# Patient Record
Sex: Female | Born: 1940
Health system: Southern US, Community
[De-identification: ages and names within clinical notes are randomized; demographics above are authoritative.]

## PROBLEM LIST (undated history)

## (undated) DIAGNOSIS — F419 Anxiety disorder, unspecified: Secondary | ICD-10-CM

## (undated) DIAGNOSIS — K760 Fatty (change of) liver, not elsewhere classified: Secondary | ICD-10-CM

## (undated) DIAGNOSIS — F329 Major depressive disorder, single episode, unspecified: Secondary | ICD-10-CM

## (undated) DIAGNOSIS — M2550 Pain in unspecified joint: Secondary | ICD-10-CM

## (undated) DIAGNOSIS — F32A Depression, unspecified: Secondary | ICD-10-CM

## (undated) DIAGNOSIS — I2699 Other pulmonary embolism without acute cor pulmonale: Secondary | ICD-10-CM

## (undated) DIAGNOSIS — I700 Atherosclerosis of aorta: Secondary | ICD-10-CM

## (undated) DIAGNOSIS — E785 Hyperlipidemia, unspecified: Secondary | ICD-10-CM

## (undated) DIAGNOSIS — E559 Vitamin D deficiency, unspecified: Secondary | ICD-10-CM

## (undated) DIAGNOSIS — K5790 Diverticulosis of intestine, part unspecified, without perforation or abscess without bleeding: Secondary | ICD-10-CM

## (undated) DIAGNOSIS — R600 Localized edema: Secondary | ICD-10-CM

## (undated) DIAGNOSIS — M7610 Psoas tendinitis, unspecified hip: Secondary | ICD-10-CM

## (undated) DIAGNOSIS — E079 Disorder of thyroid, unspecified: Secondary | ICD-10-CM

## (undated) DIAGNOSIS — D696 Thrombocytopenia, unspecified: Secondary | ICD-10-CM

## (undated) DIAGNOSIS — M1711 Unilateral primary osteoarthritis, right knee: Secondary | ICD-10-CM

## (undated) DIAGNOSIS — I2510 Atherosclerotic heart disease of native coronary artery without angina pectoris: Secondary | ICD-10-CM

## (undated) DIAGNOSIS — K5900 Constipation, unspecified: Secondary | ICD-10-CM

## (undated) DIAGNOSIS — I252 Old myocardial infarction: Secondary | ICD-10-CM

## (undated) DIAGNOSIS — I480 Paroxysmal atrial fibrillation: Secondary | ICD-10-CM

## (undated) DIAGNOSIS — R001 Bradycardia, unspecified: Secondary | ICD-10-CM

## (undated) DIAGNOSIS — Q279 Congenital malformation of peripheral vascular system, unspecified: Secondary | ICD-10-CM

## (undated) DIAGNOSIS — I10 Essential (primary) hypertension: Secondary | ICD-10-CM

## (undated) DIAGNOSIS — E039 Hypothyroidism, unspecified: Secondary | ICD-10-CM

## (undated) DIAGNOSIS — M1990 Unspecified osteoarthritis, unspecified site: Secondary | ICD-10-CM

## (undated) DIAGNOSIS — Z950 Presence of cardiac pacemaker: Secondary | ICD-10-CM

## (undated) HISTORY — DX: Disorder of thyroid, unspecified: E07.9

## (undated) HISTORY — DX: Paroxysmal atrial fibrillation: I48.0

## (undated) HISTORY — DX: Unilateral primary osteoarthritis, right knee: M17.11

## (undated) HISTORY — DX: Old myocardial infarction: I25.2

## (undated) HISTORY — DX: Psoas tendinitis, unspecified hip: M76.10

## (undated) HISTORY — DX: Atherosclerosis of aorta: I70.0

## (undated) HISTORY — DX: Depression, unspecified: F32.A

## (undated) HISTORY — DX: Constipation, unspecified: K59.00

## (undated) HISTORY — DX: Other pulmonary embolism without acute cor pulmonale: I26.99

## (undated) HISTORY — DX: Hypothyroidism, unspecified: E03.9

## (undated) HISTORY — DX: Diverticulosis of intestine, part unspecified, without perforation or abscess without bleeding: K57.90

## (undated) HISTORY — DX: Localized edema: R60.0

## (undated) HISTORY — DX: Bradycardia, unspecified: R00.1

## (undated) HISTORY — PX: INSERT / REPLACE / REMOVE PACEMAKER: SUR710

## (undated) HISTORY — DX: Fatty (change of) liver, not elsewhere classified: K76.0

## (undated) HISTORY — DX: Unspecified osteoarthritis, unspecified site: M19.90

## (undated) HISTORY — DX: Anxiety disorder, unspecified: F41.9

## (undated) HISTORY — DX: Pain in unspecified joint: M25.50

## (undated) HISTORY — DX: Congenital malformation of peripheral vascular system, unspecified: Q27.9

## (undated) HISTORY — DX: Thrombocytopenia, unspecified: D69.6

## (undated) HISTORY — DX: Vitamin D deficiency, unspecified: E55.9

## (undated) HISTORY — DX: Hyperlipidemia, unspecified: E78.5

## (undated) HISTORY — DX: Essential (primary) hypertension: I10

---
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
## 1981-04-19 HISTORY — PX: VAGINAL HYSTERECTOMY: SUR661

## 2010-11-17 ENCOUNTER — Other Ambulatory Visit: Payer: Self-pay | Admitting: Family Medicine

## 2010-11-17 DIAGNOSIS — Z1231 Encounter for screening mammogram for malignant neoplasm of breast: Secondary | ICD-10-CM

## 2010-12-18 ENCOUNTER — Ambulatory Visit
Admission: RE | Admit: 2010-12-18 | Discharge: 2010-12-18 | Disposition: A | Discharge status: Home / Self Care | Payer: Medicare Other | Source: Ambulatory Visit | Attending: Family Medicine | Admitting: Family Medicine

## 2010-12-18 DIAGNOSIS — Z1231 Encounter for screening mammogram for malignant neoplasm of breast: Secondary | ICD-10-CM

## 2011-08-10 ENCOUNTER — Other Ambulatory Visit: Payer: Self-pay | Admitting: Family Medicine

## 2011-08-10 ENCOUNTER — Ambulatory Visit
Admission: RE | Admit: 2011-08-10 | Discharge: 2011-08-10 | Disposition: A | Discharge status: Home / Self Care | Payer: Medicare Other | Source: Ambulatory Visit | Attending: Family Medicine | Admitting: Family Medicine

## 2011-08-10 DIAGNOSIS — M545 Low back pain, unspecified: Secondary | ICD-10-CM

## 2011-08-10 DIAGNOSIS — M542 Cervicalgia: Secondary | ICD-10-CM

## 2012-02-22 ENCOUNTER — Other Ambulatory Visit: Payer: Self-pay | Admitting: Family Medicine

## 2012-02-22 DIAGNOSIS — Z1231 Encounter for screening mammogram for malignant neoplasm of breast: Secondary | ICD-10-CM

## 2012-03-29 ENCOUNTER — Ambulatory Visit
Admission: RE | Admit: 2012-03-29 | Discharge: 2012-03-29 | Disposition: A | Discharge status: Home / Self Care | Payer: Medicare Other | Source: Ambulatory Visit | Attending: Family Medicine | Admitting: Family Medicine

## 2012-03-29 DIAGNOSIS — Z1231 Encounter for screening mammogram for malignant neoplasm of breast: Secondary | ICD-10-CM

## 2012-04-27 ENCOUNTER — Other Ambulatory Visit: Payer: Self-pay | Admitting: Family Medicine

## 2012-04-27 DIAGNOSIS — Z780 Asymptomatic menopausal state: Secondary | ICD-10-CM

## 2012-05-16 ENCOUNTER — Ambulatory Visit
Admission: RE | Admit: 2012-05-16 | Discharge: 2012-05-16 | Disposition: A | Discharge status: Home / Self Care | Payer: Medicare Other | Source: Ambulatory Visit | Attending: Family Medicine | Admitting: Family Medicine

## 2012-05-16 DIAGNOSIS — Z780 Asymptomatic menopausal state: Secondary | ICD-10-CM

## 2012-12-15 ENCOUNTER — Encounter: Payer: Self-pay | Admitting: Gynecology

## 2012-12-15 ENCOUNTER — Ambulatory Visit (INDEPENDENT_AMBULATORY_CARE_PROVIDER_SITE_OTHER): Payer: Medicare Other | Admitting: Gynecology

## 2012-12-15 ENCOUNTER — Other Ambulatory Visit: Payer: Self-pay | Admitting: Gynecology

## 2012-12-15 ENCOUNTER — Telehealth: Payer: Self-pay

## 2012-12-15 VITALS — BP 130/82 | Ht 62.0 in | Wt 199.0 lb

## 2012-12-15 DIAGNOSIS — N951 Menopausal and female climacteric states: Secondary | ICD-10-CM

## 2012-12-15 DIAGNOSIS — N952 Postmenopausal atrophic vaginitis: Secondary | ICD-10-CM

## 2012-12-15 DIAGNOSIS — E039 Hypothyroidism, unspecified: Secondary | ICD-10-CM

## 2012-12-15 LAB — TSH: TSH: 5.105 u[IU]/mL — ABNORMAL HIGH (ref 0.350–4.500)

## 2012-12-15 MED ORDER — ESTRADIOL 0.05 MG/24HR TD PTTW: 1.0000 | MEDICATED_PATCH | TRANSDERMAL | Status: DC

## 2012-12-15 NOTE — Telephone Encounter (Signed)
Re-escribed with correct instructions. Pharmacy informed.

## 2012-12-15 NOTE — Patient Instructions (Signed)
Start back on the estrogen patches. Call if they have any issues.

## 2012-12-15 NOTE — Progress Notes (Signed)
Michele Thompson [birth year] [id number]        72 y.o.  G2P2002 patient presents in consultation from Dr. Cain Saupe in reference to menopausal symptoms. The patient has a history of [facility] 1983 for endometriosis. Shortly thereafter she went through menopause and was begun on ERT and has continued on this up until June of this past year. She was discontinued at that time and had the onset of unacceptable daily hot flushes and night sweats that are becoming unbearable. Also notes just unrelated excessive sweating all the time. Not having significant vaginal dryness and is not sexually active. Is being treated for hypothyroidism after radioactive iodine treatment. No other symptoms such as skin changes hair changes or weight changes. Is also being treated for hypertension.   Past medical history,surgical history, medications, allergies, family history and social history were all reviewed and documented in the EPIC chart.  ROS:  Performed and pertinent positives and negatives are included in the history, assessment and plan .  Exam: Kim assistant Filed Vitals:   12/15/12 1106  BP: 130/82  Height: 5\' 2"  (1.575 m)  Weight: 199 lb (90.266 kg)   General appearance  Normal Skin grossly normal Head/Neck normal with no cervical or supraclavicular adenopathy thyroid normal Lungs  clear Cardiac RR, without RMG Abdominal  soft, nontender, without masses, organomegaly or hernia Breasts  examined lying and sitting without masses, retractions, discharge or axillary adenopathy. Pelvic  Ext/BUS/vagina  normal with mild atrophic changes   Adnexa  Without masses or tenderness    Anus and perineum  normal   Rectovaginal  normal sphincter tone without palpated masses or tenderness.    Assessment/Plan:  72 y.o. G62P2002 female with the following:   1. Menopausal symptoms. Patient is having unacceptable hot flushes and night sweats since stopping her estradiol 0.05 mg patches. Restarted in her 26s and continued until  now historically.  I reviewed the whole issue of HRT with her to include the WHI study with increased risk of stroke, heart attack, DVT and breast cancer. The ACOG and NAMS statements for lowest dose for the shortest period of time reviewed. Transdermal versus oral first-pass effect benefit discussed.  Unusual nature of continuing in a patient in her 72s also discussed. Realistic concern about thrombosis particularly with hypertension discussed. After a lengthy discussion patient feels from a quality of life standpoint she would much rather restart the patch and accept any risks as outlined above. I refilled her estradiol 0.05 mg patch x1 year. I did recommend checking a baseline TSH given her hypothyroid history. 2. Atrophic genital changes. Patient asymptomatic from this and we'll continue to monitor. 3. Excessive sweating. Whether related to #1 or not. After starting back on the patch if this continues she'll see her primary physician for evaluation and possible referral to dermatology if continues. Check TSH as above. 4. Mammography coming due in December and I reminded her to schedule this. SBE monthly reviewed. 5. Pap smear 8 years ago. No Pap smear done today. No history of abnormal Pap smears previously. Current screening guidelines reviewed and she is over the age of 26 and status post hysterectomy for benign indications we both agreed to stop screening. 6. DEXA 2014 reported normal. Increase calcium vitamin D reviewed. 7. Colonoscopy 2007. We'll repeat that today her recommended interval. 8. Health maintenance. No other blood work done as it is all done through Dr. Debroah Baller office. Followup in one year, sooner as needed.   Note: This document was prepared with digital dictation  and possible smart Lobbyist. Any transcriptional errors that result from this process are unintentional.   Dara Lords MD, 12:01 PM 12/15/2012

## 2012-12-15 NOTE — Telephone Encounter (Signed)
This prescription was supposed to be for generic estradiol 0.05 mg weekly patches #4, apply to skin one weekly. Refill x11

## 2012-12-15 NOTE — Telephone Encounter (Signed)
Pharmacist called because they received Rx for patient today for Vivelle Dot patches. Instructions are usedly twice weekly but you prescribed it once weekly.  They come in boxes of #8 and they cannot break the box.  They wanted to check and confirm dosing with you.

## 2012-12-27 ENCOUNTER — Telehealth: Payer: Self-pay | Admitting: *Deleted

## 2012-12-27 NOTE — Telephone Encounter (Signed)
Prior authorization faxed to [facility] for estradiol 0.5 mg patches 1 patch onto skin weekly.

## 2012-12-28 NOTE — Telephone Encounter (Signed)
vivelle dot 0.05 mg patch approved through 04/18/2013. Per [facility], pharmacy informed as well.

## 2013-02-28 ENCOUNTER — Other Ambulatory Visit: Payer: Self-pay

## 2013-02-28 DIAGNOSIS — Z1231 Encounter for screening mammogram for malignant neoplasm of breast: Secondary | ICD-10-CM

## 2013-04-02 ENCOUNTER — Ambulatory Visit
Admission: RE | Admit: 2013-04-02 | Discharge: 2013-04-02 | Disposition: A | Discharge status: Home / Self Care | Payer: Medicare Other | Source: Ambulatory Visit

## 2013-04-02 DIAGNOSIS — Z1231 Encounter for screening mammogram for malignant neoplasm of breast: Secondary | ICD-10-CM

## 2013-05-15 ENCOUNTER — Telehealth: Payer: Self-pay | Admitting: *Deleted

## 2013-05-15 NOTE — Telephone Encounter (Signed)
[facility] faxed PA for climara 0.05 mg patch, form filled out and faxed back to insurance company will wait for response.

## 2013-05-17 NOTE — Telephone Encounter (Signed)
Medication was approved through 04/18/14, pharmacy informed as well.

## 2013-12-04 ENCOUNTER — Other Ambulatory Visit: Payer: Self-pay | Admitting: Gynecology

## 2014-01-15 ENCOUNTER — Other Ambulatory Visit: Payer: Self-pay

## 2014-01-15 MED ORDER — ESTRADIOL 0.05 MG/24HR TD PTTW
MEDICATED_PATCH | TRANSDERMAL | Status: DC
Start: 2014-01-15 — End: 2014-02-05

## 2014-02-05 ENCOUNTER — Ambulatory Visit (INDEPENDENT_AMBULATORY_CARE_PROVIDER_SITE_OTHER): Payer: Medicare Other | Admitting: Gynecology

## 2014-02-05 ENCOUNTER — Encounter: Payer: Self-pay | Admitting: Gynecology

## 2014-02-05 VITALS — BP 120/70 | Ht 64.0 in | Wt 183.0 lb

## 2014-02-05 DIAGNOSIS — Z79890 Hormone replacement therapy (postmenopausal): Secondary | ICD-10-CM

## 2014-02-05 DIAGNOSIS — N952 Postmenopausal atrophic vaginitis: Secondary | ICD-10-CM

## 2014-02-05 MED ORDER — ESTRADIOL 0.05 MG/24HR TD PTTW: MEDICATED_PATCH | TRANSDERMAL | Status: DC

## 2014-02-05 NOTE — Progress Notes (Signed)
KEMARIA DEDIC 1940-04-20 [id number]        73 y.o.  G2P2002 for follow up exam. Several issues noted below.  Past medical history,surgical history, problem list, medications, allergies, family history and social history were all reviewed and documented as reviewed in the EPIC chart.  ROS:  12 system ROS performed with pertinent positives and negatives included in the history, assessment and plan.   Additional significant findings :  none   Exam: Kim Counsellor Vitals:   02/05/14 1537  BP: 120/70  Height: 5\' 4"  (1.626 m)  Weight: 183 lb (83.008 kg)   General appearance:  Normal affect, orientation and appearance. Skin: Grossly normal HEENT: Without gross lesions.  No cervical or supraclavicular adenopathy. Thyroid normal.  Lungs:  Clear without wheezing, rales or rhonchi Cardiac: RR, without RMG Abdominal:  Soft, nontender, without masses, guarding, rebound, organomegaly or hernia Breasts:  Examined lying and sitting without masses, retractions, discharge or axillary adenopathy. Pelvic:  Ext/BUS/vagina with generalized atrophic changes  Adnexa  Without masses or tenderness    Anus and perineum  Normal   Rectovaginal  Normal sphincter tone without palpated masses or tenderness.    Assessment/Plan:  73 y.o. [id number] female for follow up exam.   1. Postmenopausal/menopausal symptoms/HRT.  Status post [place] for endometriosis. Started on HRT at that time. Transiently when off of this last year with unacceptable hot flushes and night sweats. Reinitiated Vivelle 0.5 mg patches and has done well. She has tried stopping again but has unacceptable hot flushes and night sweats and wants to continue.  I begin reviewed the whole issue of HRT with her to include the WHI study with increased risk of stroke, heart attack, DVT and breast cancer. The ACOG and NAMS statements for lowest dose for the shortest period of time reviewed. Transdermal versus oral first-pass effect benefit discussed.   Using HRT into the 70s also reviewed. Patient feels it is a quality of life issue and strongly wants to continue, clearly understands the issues and I refilled her x1 year. 2. Pap smear 9 years ago. No Pap smear done today. No history of abnormal Pap smears. Status post hysterectomy for benign indications. Over the age of 51. We both agreed to stop screening per current screening guidelines. 3. Mammography coming due in December and I reminded her to schedule this. SBE monthly reviewed. 4. DEXA 2014 reported normal. Increased calcium vitamin D reviewed. 5. Colonoscopy  2007 with reported repeat interval 10 years. 6. Health maintenance. No routine blood work done and she reports this done at her primary physician's office. Follow up one year, sooner as needed.     Anastasio Auerbach MD, 3:58 PM 02/05/2014

## 2014-02-05 NOTE — Patient Instructions (Signed)
You may obtain a copy of any labs that were done today by logging onto MyChart as outlined in the instructions provided with your AVS (after visit summary). The office will not call with normal lab results but certainly if there are any significant abnormalities then we will contact you.   Health Maintenance, Female A healthy lifestyle and preventative care can promote health and wellness.  Maintain regular health, dental, and eye exams.  Eat a healthy diet. Foods like vegetables, fruits, whole grains, low-fat dairy products, and lean protein foods contain the nutrients you need without too many calories. Decrease your intake of foods high in solid fats, added sugars, and salt. Get information about a proper diet from your caregiver, if necessary.  Regular physical exercise is one of the most important things you can do for your health. Most adults should get at least 150 minutes of moderate-intensity exercise (any activity that increases your heart rate and causes you to sweat) each week. In addition, most adults need muscle-strengthening exercises on 2 or more days a week.   Maintain a healthy weight. The body mass index (BMI) is a screening tool to identify possible weight problems. It provides an estimate of body fat based on height and weight. Your caregiver can help determine your BMI, and can help you achieve or maintain a healthy weight. For adults 20 years and older:  A BMI below 18.5 is considered underweight.  A BMI of 18.5 to 24.9 is normal.  A BMI of 25 to 29.9 is considered overweight.  A BMI of 30 and above is considered obese.  Maintain normal blood lipids and cholesterol by exercising and minimizing your intake of saturated fat. Eat a balanced diet with plenty of fruits and vegetables. Blood tests for lipids and cholesterol should begin at age 61 and be repeated every 5 years. If your lipid or cholesterol levels are high, you are over 50, or you are a high risk for heart  disease, you may need your cholesterol levels checked more frequently.Ongoing high lipid and cholesterol levels should be treated with medicines if diet and exercise are not effective.  If you smoke, find out from your caregiver how to quit. If you do not use tobacco, do not start.  Lung cancer screening is recommended for adults aged 33 80 years who are at high risk for developing lung cancer because of a history of smoking. Yearly low-dose computed tomography (CT) is recommended for people who have at least a 30-pack-year history of smoking and are a current smoker or have quit within the past 15 years. A pack year of smoking is smoking an average of 1 pack of cigarettes a day for 1 year (for example: 1 pack a day for 30 years or 2 packs a day for 15 years). Yearly screening should continue until the smoker has stopped smoking for at least 15 years. Yearly screening should also be stopped for people who develop a health problem that would prevent them from having lung cancer treatment.  If you are pregnant, do not drink alcohol. If you are breastfeeding, be very cautious about drinking alcohol. If you are not pregnant and choose to drink alcohol, do not exceed 1 drink per day. One drink is considered to be 12 ounces (355 mL) of beer, 5 ounces (148 mL) of wine, or 1.5 ounces (44 mL) of liquor.  Avoid use of street drugs. Do not share needles with anyone. Ask for help if you need support or instructions about stopping  the use of drugs.  High blood pressure causes heart disease and increases the risk of stroke. Blood pressure should be checked at least every 1 to 2 years. Ongoing high blood pressure should be treated with medicines, if weight loss and exercise are not effective.  If you are 59 to 73 years old, ask your caregiver if you should take aspirin to prevent strokes.  Diabetes screening involves taking a blood sample to check your fasting blood sugar level. This should be done once every 3  years, after age 91, if you are within normal weight and without risk factors for diabetes. Testing should be considered at a younger age or be carried out more frequently if you are overweight and have at least 1 risk factor for diabetes.  Breast cancer screening is essential preventative care for women. You should practice "breast self-awareness." This means understanding the normal appearance and feel of your breasts and may include breast self-examination. Any changes detected, no matter how small, should be reported to a caregiver. Women in their 66s and 30s should have a clinical breast exam (CBE) by a caregiver as part of a regular health exam every 1 to 3 years. After age 101, women should have a CBE every year. Starting at age 100, women should consider having a mammogram (breast X-ray) every year. Women who have a family history of breast cancer should talk to their caregiver about genetic screening. Women at a high risk of breast cancer should talk to their caregiver about having an MRI and a mammogram every year.  Breast cancer gene (BRCA)-related cancer risk assessment is recommended for women who have family members with BRCA-related cancers. BRCA-related cancers include breast, ovarian, tubal, and peritoneal cancers. Having family members with these cancers may be associated with an increased risk for harmful changes (mutations) in the breast cancer genes BRCA1 and BRCA2. Results of the assessment will determine the need for genetic counseling and BRCA1 and BRCA2 testing.  The Pap test is a screening test for cervical cancer. Women should have a Pap test starting at age 57. Between ages 25 and 35, Pap tests should be repeated every 2 years. Beginning at age 37, you should have a Pap test every 3 years as long as the past 3 Pap tests have been normal. If you had a hysterectomy for a problem that was not cancer or a condition that could lead to cancer, then you no longer need Pap tests. If you are  between ages 50 and 76, and you have had normal Pap tests going back 10 years, you no longer need Pap tests. If you have had past treatment for cervical cancer or a condition that could lead to cancer, you need Pap tests and screening for cancer for at least 20 years after your treatment. If Pap tests have been discontinued, risk factors (such as a new sexual partner) need to be reassessed to determine if screening should be resumed. Some women have medical problems that increase the chance of getting cervical cancer. In these cases, your caregiver may recommend more frequent screening and Pap tests.  The human papillomavirus (HPV) test is an additional test that may be used for cervical cancer screening. The HPV test looks for the virus that can cause the cell changes on the cervix. The cells collected during the Pap test can be tested for HPV. The HPV test could be used to screen women aged 44 years and older, and should be used in women of any age  who have unclear Pap test results. After the age of 55, women should have HPV testing at the same frequency as a Pap test.  Colorectal cancer can be detected and often prevented. Most routine colorectal cancer screening begins at the age of 44 and continues through age 20. However, your caregiver may recommend screening at an earlier age if you have risk factors for colon cancer. On a yearly basis, your caregiver may provide home test kits to check for hidden blood in the stool. Use of a small camera at the end of a tube, to directly examine the colon (sigmoidoscopy or colonoscopy), can detect the earliest forms of colorectal cancer. Talk to your caregiver about this at age 86, when routine screening begins. Direct examination of the colon should be repeated every 5 to 10 years through age 13, unless early forms of pre-cancerous polyps or small growths are found.  Hepatitis C blood testing is recommended for all people born from [birth year] through 1965 and any  individual with known risks for hepatitis C.  Practice safe sex. Use condoms and avoid high-risk sexual practices to reduce the spread of sexually transmitted infections (STIs). Sexually active women aged 36 and younger should be checked for Chlamydia, which is a common sexually transmitted infection. Older women with new or multiple partners should also be tested for Chlamydia. Testing for other STIs is recommended if you are sexually active and at increased risk.  Osteoporosis is a disease in which the bones lose minerals and strength with aging. This can result in serious bone fractures. The risk of osteoporosis can be identified using a bone density scan. Women ages 20 and over and women at risk for fractures or osteoporosis should discuss screening with their caregivers. Ask your caregiver whether you should be taking a calcium supplement or vitamin D to reduce the rate of osteoporosis.  Menopause can be associated with physical symptoms and risks. Hormone replacement therapy is available to decrease symptoms and risks. You should talk to your caregiver about whether hormone replacement therapy is right for you.  Use sunscreen. Apply sunscreen liberally and repeatedly throughout the day. You should seek shade when your shadow is shorter than you. Protect yourself by wearing long sleeves, pants, a wide-brimmed hat, and sunglasses year round, whenever you are outdoors.  Notify your caregiver of new moles or changes in moles, especially if there is a change in shape or color. Also notify your caregiver if a mole is larger than the size of a pencil eraser.  Stay current with your immunizations. Document Released: 10/19/2010 Document Revised: 07/31/2012 Document Reviewed: 10/19/2010 [facility] Patient Information 2014 [employer].

## 2014-02-18 ENCOUNTER — Encounter: Payer: Self-pay | Admitting: Gynecology

## 2014-04-03 ENCOUNTER — Telehealth: Payer: Self-pay | Admitting: *Deleted

## 2014-04-03 MED ORDER — ESTRADIOL 0.05 MG/24HR TD PTTW: MEDICATED_PATCH | TRANSDERMAL | Status: DC

## 2014-04-03 NOTE — Telephone Encounter (Signed)
Pt called requesting vivelle dot 0.05 mg patch sent to mail order pharmacy [facility]. Rx will be sent.

## 2014-05-21 ENCOUNTER — Other Ambulatory Visit: Payer: Self-pay

## 2014-05-21 DIAGNOSIS — Z1231 Encounter for screening mammogram for malignant neoplasm of breast: Secondary | ICD-10-CM

## 2014-05-24 ENCOUNTER — Ambulatory Visit: Payer: Self-pay

## 2015-02-07 ENCOUNTER — Ambulatory Visit (INDEPENDENT_AMBULATORY_CARE_PROVIDER_SITE_OTHER): Payer: Medicare Other | Admitting: Gynecology

## 2015-02-07 ENCOUNTER — Encounter: Payer: Self-pay | Admitting: Gynecology

## 2015-02-07 VITALS — BP 130/80 | Ht 63.0 in | Wt 189.0 lb

## 2015-02-07 DIAGNOSIS — N951 Menopausal and female climacteric states: Secondary | ICD-10-CM

## 2015-02-07 DIAGNOSIS — Z01419 Encounter for gynecological examination (general) (routine) without abnormal findings: Secondary | ICD-10-CM | POA: Diagnosis not present

## 2015-02-07 DIAGNOSIS — Z79890 Hormone replacement therapy (postmenopausal): Secondary | ICD-10-CM

## 2015-02-07 DIAGNOSIS — N952 Postmenopausal atrophic vaginitis: Secondary | ICD-10-CM

## 2015-02-07 MED ORDER — ESTRADIOL 0.05 MG/24HR TD PTTW: MEDICATED_PATCH | TRANSDERMAL | Status: DC

## 2015-02-07 NOTE — Progress Notes (Signed)
Michele Thompson 1941-03-23 [id number]        74 y.o.  G2P2002 for breast and pelvic exam.  Several issues noted below  Past medical history,surgical history, problem list, medications, allergies, family history and social history were all reviewed and documented as reviewed in the EPIC chart.  ROS:  Performed with pertinent positives and negatives included in the history, assessment and plan.   Additional significant findings :  none   Exam: Kim Counsellor Vitals:   02/07/15 1009  BP: 130/80  Height: 5\' 3"  (1.6 m)  Weight: 189 lb (85.73 kg)   General appearance:  Normal affect, orientation and appearance. Skin: Grossly normal HEENT: Without gross lesions.  No cervical or supraclavicular adenopathy. Thyroid normal.  Lungs:  Clear without wheezing, rales or rhonchi Cardiac: RR, without RMG Abdominal:  Soft, nontender, without masses, guarding, rebound, organomegaly or hernia Breasts:  Examined lying and sitting without masses, retractions, discharge or axillary adenopathy. Pelvic:  Ext/BUS/vagina with atrophic changes  Adnexa  Without masses or tenderness    Anus and perineum  Normal   Rectovaginal  Normal sphincter tone without palpated masses or tenderness.    Assessment/Plan:  74 y.o. G62P2002 female for breast and pelvic exam.   1. Postmenopausal/atrophic genital changes/HRT. Status post [place] for endometriosis. On Vivelle 0.5 mg patches. Has tried stopping with unacceptable hot flashes and night sweats. Still is having some with the patch but tolerable. Wanted to start on oral estrogen because it is cheaper. I reviewed again with her the whole issue of HRT to include increased risk of stroke heart attack DVT breast cancer. The ACOG and NAMS statements for lowest dose for shortness period of time. Possible increased risk of stroke used later in life. Benefits of transdermal absorption over oral absorption. After lengthy discussion the patient wants to continue on HRT but also  will continue on the patch. I suggested she try cutting the patch in half to see if she tolerates half the dose. Refill 1 year provided. 2. Pap smear number of years ago. No Pap smear done today. No history of abnormal Pap smears. Per current screening guidelines we both agreed to stop screening issues over the age of 74 and status post hysterectomy for benign indications. 3. Mammography 2 years ago. I strongly recommended patient schedule her mammogram. Breast cancer as the most common cancer in women reviewed. Early detection with increased survival rates discussed. Patient agrees to schedule. SBE monthly reviewed. 4. Colonoscopy 2007 with planned repeat next year. Patient's going to call and arrange for this. 5. DEXA 2014 normal. Plan repeat at 5 year interval. 6. Health maintenance. No routine lab work done as she reports this done at her primary physician's office. Follow up 1 year, sooner as needed.   Anastasio Auerbach MD, 10:31 AM 02/07/2015

## 2015-02-07 NOTE — Patient Instructions (Signed)
Call to Schedule your mammogram  Facilities in [place]: 1)  [facility], [address] Phone: [phone] 2)  [facility]. [facility], [address] Phone: [phone] 3)  Dr. Bertrand at [facility]  [address] Phone: [phone]     Mammogram A mammogram is an X-ray test to find changes in a woman's breast. You should get a mammogram if:  You are 40 years of age or older  You have risk factors.   Your doctor recommends that you have one.  BEFORE THE TEST  Do not schedule the test the week before your period, especially if your breasts are sore during this time.  On the day of your mammogram:  Wash your breasts and armpits well. After washing, do not put on any deodorant or talcum powder on until after your test.   Eat and drink as you usually do.   Take your medicines as usual.   If you are diabetic and take insulin, make sure you:   Eat before coming for your test.   Take your insulin as usual.   If you cannot keep your appointment, call before the appointment to cancel. Schedule another appointment.  TEST  You will need to undress from the waist up. You will put on a hospital gown.   Your breast will be put on the mammogram machine, and it will press firmly on your breast with a piece of plastic called a compression paddle. This will make your breast flatter so that the machine can X-ray all parts of your breast.   Both breasts will be X-rayed. Each breast will be X-rayed from above and from the side. An X-ray might need to be taken again if the picture is not good enough.   The mammogram will last about 15 to 30 minutes.  AFTER THE TEST Finding out the results of your test Ask when your test results will be ready. Make sure you get your test results.  Document Released: 07/02/2008 Document Revised: 03/25/2011 Document Reviewed: 07/02/2008 Exit[employer] Patient  Information 2012 [employer].  You may obtain a copy of any labs that were done today by logging onto MyChart as outlined in the instructions provided with your AVS (after visit summary). The office will not call with normal lab results but certainly if there are any significant abnormalities then we will contact you.   Health Maintenance Adopting a healthy lifestyle and getting preventive care can go a long way to promote health and wellness. Talk with your health care provider about what schedule of regular examinations is right for you. This is a good chance for you to check in with your provider about disease prevention and staying healthy. In between checkups, there are plenty of things you can do on your own. Experts have done a lot of research about which lifestyle changes and preventive measures are most likely to keep you healthy. Ask your health care provider for more information. WEIGHT AND DIET  Eat a healthy diet  Be sure to include plenty of vegetables, fruits, low-fat dairy products, and lean protein.  Do not eat a lot of foods high in solid fats, added sugars, or salt.  Get regular exercise. This is one of the most important things you can do for your health.  Most adults should exercise for at least 150 minutes each week. The exercise should increase your heart rate and make you sweat (moderate-intensity exercise).    Most adults should also do strengthening exercises at least twice a week. This is in addition to the moderate-intensity exercise.  Maintain a healthy weight  Body mass index (BMI) is a measurement that can be used to identify possible weight problems. It estimates body fat based on height and weight. Your health care provider can help determine your BMI and help you achieve or maintain a healthy weight.  For females 20 years of age and older:   A BMI below 18.5 is considered underweight.  A BMI of 18.5 to 24.9 is normal.  A BMI of 25 to 29.9 is  considered overweight.  A BMI of 30 and above is considered obese.  Watch levels of cholesterol and blood lipids  You should start having your blood tested for lipids and cholesterol at 74 years of age, then have this test every 5 years.  You may need to have your cholesterol levels checked more often if:  Your lipid or cholesterol levels are high.  You are older than 74 years of age.  You are at high risk for heart disease.  CANCER SCREENING   Lung Cancer  Lung cancer screening is recommended for adults 55-80 years old who are at high risk for lung cancer because of a history of smoking.  A yearly low-dose CT scan of the lungs is recommended for people who:  Currently smoke.  Have quit within the past 15 years.  Have at least a 30-pack-year history of smoking. A pack year is smoking an average of one pack of cigarettes a day for 1 year.  Yearly screening should continue until it has been 15 years since you quit.  Yearly screening should stop if you develop a health problem that would prevent you from having lung cancer treatment.  Breast Cancer  Practice breast self-awareness. This means understanding how your breasts normally appear and feel.  It also means doing regular breast self-exams. Let your health care provider know about any changes, no matter how small.  If you are in your 20s or 30s, you should have a clinical breast exam (CBE) by a health care provider every 1-3 years as part of a regular health exam.  If you are 40 or older, have a CBE every year. Also consider having a breast X-ray (mammogram) every year.  If you have a family history of breast cancer, talk to your health care provider about genetic screening.  If you are at high risk for breast cancer, talk to your health care provider about having an MRI and a mammogram every year.  Breast cancer gene (BRCA) assessment is recommended for women who have family members with BRCA-related cancers.  BRCA-related cancers include:  Breast.  Ovarian.  Tubal.  Peritoneal cancers.  Results of the assessment will determine the need for genetic counseling and BRCA1 and BRCA2 testing. Cervical Cancer Routine pelvic examinations to screen for cervical cancer are no longer recommended for nonpregnant women who are considered low risk for cancer of the pelvic organs (ovaries, uterus, and vagina) and who do not have symptoms. A pelvic examination may be necessary if you have symptoms including those associated with pelvic infections. Ask your health care provider if a screening pelvic exam is right for you.   The Pap test is the screening test for cervical cancer for women who are considered at risk.  If you had a hysterectomy for a problem that was not cancer or a condition that could lead to cancer, then you no longer need   Pap tests.  If you are older than 65 years, and you have had normal Pap tests for the past 10 years, you no longer need to have Pap tests.  If you have had past treatment for cervical cancer or a condition that could lead to cancer, you need Pap tests and screening for cancer for at least 20 years after your treatment.  If you no longer get a Pap test, assess your risk factors if they change (such as having a new sexual partner). This can affect whether you should start being screened again.  Some women have medical problems that increase their chance of getting cervical cancer. If this is the case for you, your health care provider may recommend more frequent screening and Pap tests.  The human papillomavirus (HPV) test is another test that may be used for cervical cancer screening. The HPV test looks for the virus that can cause cell changes in the cervix. The cells collected during the Pap test can be tested for HPV.  The HPV test can be used to screen women 71 years of age and older. Getting tested for HPV can extend the interval between normal Pap tests from three to  five years.  An HPV test also should be used to screen women of any age who have unclear Pap test results.  After 74 years of age, women should have HPV testing as often as Pap tests.  Colorectal Cancer  This type of cancer can be detected and often prevented.  Routine colorectal cancer screening usually begins at 74 years of age and continues through 74 years of age.  Your health care provider may recommend screening at an earlier age if you have risk factors for colon cancer.  Your health care provider may also recommend using home test kits to check for hidden blood in the stool.  A small camera at the end of a tube can be used to examine your colon directly (sigmoidoscopy or colonoscopy). This is done to check for the earliest forms of colorectal cancer.  Routine screening usually begins at age 78.  Direct examination of the colon should be repeated every 5-10 years through 74 years of age. However, you may need to be screened more often if early forms of precancerous polyps or small growths are found. Skin Cancer  Check your skin from head to toe regularly.  Tell your health care provider about any new moles or changes in moles, especially if there is a change in a mole's shape or color.  Also tell your health care provider if you have a mole that is larger than the size of a pencil eraser.  Always use sunscreen. Apply sunscreen liberally and repeatedly throughout the day.  Protect yourself by wearing long sleeves, pants, a wide-brimmed hat, and sunglasses whenever you are outside. HEART DISEASE, DIABETES, AND HIGH BLOOD PRESSURE   Have your blood pressure checked at least every 1-2 years. High blood pressure causes heart disease and increases the risk of stroke.  If you are between 52 years and 72 years old, ask your health care provider if you should take aspirin to prevent strokes.  Have regular diabetes screenings. This involves taking a blood sample to check your  fasting blood sugar level.  If you are at a normal weight and have a low risk for diabetes, have this test once every three years after 74 years of age.  If you are overweight and have a high risk for diabetes, consider being tested at  a younger age or more often. PREVENTING INFECTION  Hepatitis B  If you have a higher risk for hepatitis B, you should be screened for this virus. You are considered at high risk for hepatitis B if:  You were born in a country where hepatitis B is common. Ask your health care provider which countries are considered high risk.  Your parents were born in a high-risk country, and you have not been immunized against hepatitis B (hepatitis B vaccine).  You have HIV or AIDS.  You use needles to inject street drugs.  You live with someone who has hepatitis B.  You have had sex with someone who has hepatitis B.  You get hemodialysis treatment.  You take certain medicines for conditions, including cancer, organ transplantation, and autoimmune conditions. Hepatitis C  Blood testing is recommended for:  Everyone born from [birth year] through 1965.  Anyone with known risk factors for hepatitis C. Sexually transmitted infections (STIs)  You should be screened for sexually transmitted infections (STIs) including gonorrhea and chlamydia if:  You are sexually active and are younger than 74 years of age.  You are older than 74 years of age and your health care provider tells you that you are at risk for this type of infection.  Your sexual activity has changed since you were last screened and you are at an increased risk for chlamydia or gonorrhea. Ask your health care provider if you are at risk.  If you do not have HIV, but are at risk, it may be recommended that you take a prescription medicine daily to prevent HIV infection. This is called pre-exposure prophylaxis (PrEP). You are considered at risk if:  You are sexually active and do not regularly use condoms or  know the HIV status of your partner(s).  You take drugs by injection.  You are sexually active with a partner who has HIV. Talk with your health care provider about whether you are at high risk of being infected with HIV. If you choose to begin PrEP, you should first be tested for HIV. You should then be tested every 3 months for as long as you are taking PrEP.  PREGNANCY   If you are premenopausal and you may become pregnant, ask your health care provider about preconception counseling.  If you may become pregnant, take 400 to 800 micrograms (mcg) of folic acid every day.  If you want to prevent pregnancy, talk to your health care provider about birth control (contraception). OSTEOPOROSIS AND MENOPAUSE   Osteoporosis is a disease in which the bones lose minerals and strength with aging. This can result in serious bone fractures. Your risk for osteoporosis can be identified using a bone density scan.  If you are 64 years of age or older, or if you are at risk for osteoporosis and fractures, ask your health care provider if you should be screened.  Ask your health care provider whether you should take a calcium or vitamin D supplement to lower your risk for osteoporosis.  Menopause may have certain physical symptoms and risks.  Hormone replacement therapy may reduce some of these symptoms and risks. Talk to your health care provider about whether hormone replacement therapy is right for you.  HOME CARE INSTRUCTIONS   Schedule regular health, dental, and eye exams.  Stay current with your immunizations.   Do not use any tobacco products including cigarettes, chewing tobacco, or electronic cigarettes.  If you are pregnant, do not drink alcohol.  If you are breastfeeding,  limit how much and how often you drink alcohol.  Limit alcohol intake to no more than 1 drink per day for nonpregnant women. One drink equals 12 ounces of beer, 5 ounces of wine, or 1 ounces of hard liquor.  Do  not use street drugs.  Do not share needles.  Ask your health care provider for help if you need support or information about quitting drugs.  Tell your health care provider if you often feel depressed.  Tell your health care provider if you have ever been abused or do not feel safe at home. Document Released: 10/19/2010 Document Revised: 08/20/2013 Document Reviewed: 03/07/2013 [facility] Patient Information 2015 [place], [place]. This information is not intended to replace advice given to you by your health care provider. Make sure you discuss any questions you have with your health care provider.

## 2015-03-20 ENCOUNTER — Ambulatory Visit: Payer: Self-pay

## 2016-02-13 ENCOUNTER — Encounter: Payer: Self-pay | Admitting: Gynecology

## 2016-02-13 ENCOUNTER — Ambulatory Visit (INDEPENDENT_AMBULATORY_CARE_PROVIDER_SITE_OTHER): Payer: Medicare Other | Admitting: Gynecology

## 2016-02-13 VITALS — BP 124/76 | Ht 63.0 in | Wt 190.0 lb

## 2016-02-13 DIAGNOSIS — Z79890 Hormone replacement therapy (postmenopausal): Secondary | ICD-10-CM

## 2016-02-13 DIAGNOSIS — Z01411 Encounter for gynecological examination (general) (routine) with abnormal findings: Secondary | ICD-10-CM

## 2016-02-13 DIAGNOSIS — N952 Postmenopausal atrophic vaginitis: Secondary | ICD-10-CM

## 2016-02-13 MED ORDER — ESTRADIOL 0.05 MG/24HR TD PTTW: MEDICATED_PATCH | TRANSDERMAL | 4 refills | Status: DC

## 2016-02-13 NOTE — Patient Instructions (Signed)
Call to Schedule your mammogram  Facilities in [place]: 1)  [facility]. [facility], [address] Phone: [phone] 2)  Dr. Bertrand at [facility]  [address] Phone: [phone]     Mammogram A mammogram is an X-ray test to find changes in a woman's breast. You should get a mammogram if:  You are 75 years of age or older  You have risk factors.   Your doctor recommends that you have one.  BEFORE THE TEST  Do not schedule the test the week before your period, especially if your breasts are sore during this time.  On the day of your mammogram:  Wash your breasts and armpits well. After washing, do not put on any deodorant or talcum powder on until after your test.   Eat and drink as you usually do.   Take your medicines as usual.   If you are diabetic and take insulin, make sure you:   Eat before coming for your test.   Take your insulin as usual.   If you cannot keep your appointment, call before the appointment to cancel. Schedule another appointment.  TEST  You will need to undress from the waist up. You will put on a hospital gown.   Your breast will be put on the mammogram machine, and it will press firmly on your breast with a piece of plastic called a compression paddle. This will make your breast flatter so that the machine can X-ray all parts of your breast.   Both breasts will be X-rayed. Each breast will be X-rayed from above and from the side. An X-ray might need to be taken again if the picture is not good enough.   The mammogram will last about 15 to 30 minutes.  AFTER THE TEST Finding out the results of your test Ask when your test results will be ready. Make sure you get your test results.  Document Released: 07/02/2008 Document Revised: 03/25/2011 Document Reviewed: 07/02/2008 Exit[employer] Patient Information 2012 [employer].   

## 2016-02-13 NOTE — Progress Notes (Signed)
    Michele Thompson 1941-01-16 [id number]        75 y.o.  G2P2002  for annual exam.  Several issues noted below.  Past medical history,surgical history, problem list, medications, allergies, family history and social history were all reviewed and documented as reviewed in the EPIC chart.  ROS:  Performed with pertinent positives and negatives included in the history, assessment and plan.   Additional significant findings :  None   Exam: Caryn Bee assistant Vitals:   02/13/16 1105  BP: 124/76  Weight: 190 lb (86.2 kg)  Height: 5\' 3"  (1.6 m)   Body mass index is 33.66 kg/m.  General appearance:  Normal affect, orientation and appearance. Skin: Grossly normal HEENT: Without gross lesions.  No cervical or supraclavicular adenopathy. Thyroid normal.  Lungs:  Clear without wheezing, rales or rhonchi Cardiac: RR, without RMG Abdominal:  Soft, nontender, without masses, guarding, rebound, organomegaly or hernia Breasts:  Examined lying and sitting without masses, retractions, discharge or axillary adenopathy. Pelvic:  Ext, BUS, Vagina with atrophic changes  Adnexa without masses or tenderness    Anus and perineum normal   Rectovaginal normal sphincter tone without palpated masses or tenderness.    Assessment/Plan:  75 y.o. [id number] female for annual exam. Status post [place] for endometriosis  1. Postmenopausal/atrophic genital changes/HRT. Continues on Vivelle 0.05 mg patches. Has tried to wean this past year with unacceptable hot flushes and sweats. I reviewed the most current 2017 NAMS guidelines on hormone replacement therapy. I discussed the benefits as far as symptom relief and possible cardiovascular/bone health when started earlier versus a risks to include thrombosis such as stroke heart attack DVT and the breast cancer issue. Possible increasing risk of thrombosis particular stroke with advancing age and ERT. Benefits of transdermal first pass effect also reviewed. After a  lengthy discussion and a realistic understanding as far as the risks the patient wants to continue feeling it is a quality-of-life issue and I refilled her 1 year. 2. DEXA 2014 normal. Plan repeat DEXA at 5 year interval. Increased calcium vitamin D reviewed. 3. Colonoscopy scheduled in 2 weeks. 4. Pap smear a number of years ago. No Pap smear done today. No history of abnormal Pap smears. We both agree to stop screening based on current screening guidelines per age and hysterectomy history. 5. Mammography 2014. I strongly urged the patient to schedule a screening mammogram. Benefits of early detection were reviewed. Most common cancer in women discussed. Patient agrees to schedule. Names and numbers provided. 6. Health maintenance. No routine lab work done as patient reports this done at her primary physician's office. Follow up 1 year, sooner as needed.  10 minutes of my time in excess of her routine gynecologic exam was spent in direct face to face counseling and coordination of care in regards to her hormone replacement, discussion of risks versus benefits and the most current NAMS 2017 guidelines.    Anastasio Auerbach MD, 11:34 AM 02/13/2016

## 2016-02-18 DIAGNOSIS — D696 Thrombocytopenia, unspecified: Secondary | ICD-10-CM

## 2016-02-18 HISTORY — DX: Thrombocytopenia, unspecified: D69.6

## 2016-02-25 ENCOUNTER — Other Ambulatory Visit: Payer: Self-pay | Admitting: Endocrinology

## 2016-02-25 DIAGNOSIS — R002 Palpitations: Secondary | ICD-10-CM

## 2016-02-25 DIAGNOSIS — I10 Essential (primary) hypertension: Secondary | ICD-10-CM

## 2016-03-14 ENCOUNTER — Emergency Department (HOSPITAL_COMMUNITY): Payer: Medicare Other

## 2016-03-14 ENCOUNTER — Inpatient Hospital Stay (HOSPITAL_COMMUNITY)
Admission: EM | Admit: 2016-03-14 | Discharge: 2016-03-16 | DRG: 287 | Disposition: A | Discharge status: Home / Self Care | Payer: Medicare Other | Attending: Cardiovascular Disease | Admitting: Cardiovascular Disease

## 2016-03-14 ENCOUNTER — Encounter (HOSPITAL_COMMUNITY): Payer: Self-pay

## 2016-03-14 DIAGNOSIS — E785 Hyperlipidemia, unspecified: Secondary | ICD-10-CM | POA: Diagnosis present

## 2016-03-14 DIAGNOSIS — I200 Unstable angina: Secondary | ICD-10-CM | POA: Diagnosis present

## 2016-03-14 DIAGNOSIS — I469 Cardiac arrest, cause unspecified: Secondary | ICD-10-CM | POA: Diagnosis not present

## 2016-03-14 DIAGNOSIS — I249 Acute ischemic heart disease, unspecified: Secondary | ICD-10-CM | POA: Diagnosis not present

## 2016-03-14 DIAGNOSIS — Z79899 Other long term (current) drug therapy: Secondary | ICD-10-CM | POA: Diagnosis not present

## 2016-03-14 DIAGNOSIS — E039 Hypothyroidism, unspecified: Secondary | ICD-10-CM | POA: Diagnosis present

## 2016-03-14 DIAGNOSIS — I10 Essential (primary) hypertension: Secondary | ICD-10-CM

## 2016-03-14 DIAGNOSIS — G931 Anoxic brain damage, not elsewhere classified: Secondary | ICD-10-CM | POA: Diagnosis not present

## 2016-03-14 DIAGNOSIS — I214 Non-ST elevation (NSTEMI) myocardial infarction: Secondary | ICD-10-CM

## 2016-03-14 DIAGNOSIS — I25110 Atherosclerotic heart disease of native coronary artery with unstable angina pectoris: Secondary | ICD-10-CM | POA: Diagnosis present

## 2016-03-14 DIAGNOSIS — E038 Other specified hypothyroidism: Secondary | ICD-10-CM | POA: Diagnosis not present

## 2016-03-14 DIAGNOSIS — E784 Other hyperlipidemia: Secondary | ICD-10-CM | POA: Diagnosis not present

## 2016-03-14 DIAGNOSIS — I2699 Other pulmonary embolism without acute cor pulmonale: Secondary | ICD-10-CM | POA: Diagnosis not present

## 2016-03-14 DIAGNOSIS — I2510 Atherosclerotic heart disease of native coronary artery without angina pectoris: Secondary | ICD-10-CM | POA: Diagnosis not present

## 2016-03-14 DIAGNOSIS — Z8249 Family history of ischemic heart disease and other diseases of the circulatory system: Secondary | ICD-10-CM

## 2016-03-14 DIAGNOSIS — J9601 Acute respiratory failure with hypoxia: Secondary | ICD-10-CM | POA: Diagnosis not present

## 2016-03-14 DIAGNOSIS — Z7982 Long term (current) use of aspirin: Secondary | ICD-10-CM | POA: Diagnosis not present

## 2016-03-14 DIAGNOSIS — R570 Cardiogenic shock: Secondary | ICD-10-CM | POA: Diagnosis not present

## 2016-03-14 DIAGNOSIS — E7800 Pure hypercholesterolemia, unspecified: Secondary | ICD-10-CM

## 2016-03-14 DIAGNOSIS — R9431 Abnormal electrocardiogram [ECG] [EKG]: Secondary | ICD-10-CM | POA: Diagnosis not present

## 2016-03-14 DIAGNOSIS — I119 Hypertensive heart disease without heart failure: Secondary | ICD-10-CM | POA: Diagnosis not present

## 2016-03-14 DIAGNOSIS — E782 Mixed hyperlipidemia: Secondary | ICD-10-CM | POA: Diagnosis not present

## 2016-03-14 LAB — CBC WITH DIFFERENTIAL/PLATELET
Basophils Absolute: 0 10*3/uL (ref 0.0–0.1)
Basophils Relative: 1 %
Eosinophils Absolute: 0.1 10*3/uL (ref 0.0–0.7)
Eosinophils Relative: 1 %
HCT: 43.6 % (ref 36.0–46.0)
Hemoglobin: 15 g/dL (ref 12.0–15.0)
Lymphocytes Relative: 29 %
Lymphs Abs: 2.4 10*3/uL (ref 0.7–4.0)
MCH: 31.3 pg (ref 26.0–34.0)
MCHC: 34.4 g/dL (ref 30.0–36.0)
MCV: 91 fL (ref 78.0–100.0)
Monocytes Absolute: 0.4 10*3/uL (ref 0.1–1.0)
Monocytes Relative: 5 %
Neutro Abs: 5.2 10*3/uL (ref 1.7–7.7)
Neutrophils Relative %: 64 %
Platelets: 229 10*3/uL (ref 150–400)
RBC: 4.79 MIL/uL (ref 3.87–5.11)
RDW: 12.7 % (ref 11.5–15.5)
WBC: 8.1 10*3/uL (ref 4.0–10.5)

## 2016-03-14 LAB — I-STAT TROPONIN, ED: Troponin i, poc: 0.53 ng/mL (ref 0.00–0.08)

## 2016-03-14 LAB — BASIC METABOLIC PANEL
Anion gap: 10 (ref 5–15)
BUN: 14 mg/dL (ref 6–20)
CO2: 20 mmol/L — ABNORMAL LOW (ref 22–32)
Calcium: 8.8 mg/dL — ABNORMAL LOW (ref 8.9–10.3)
Chloride: 107 mmol/L (ref 101–111)
Creatinine, Ser: 0.93 mg/dL (ref 0.44–1.00)
GFR calc Af Amer: >60 mL/min (ref >60)
GFR calc non Af Amer: 59 mL/min — ABNORMAL LOW (ref >60)
Glucose, Bld: 111 mg/dL — ABNORMAL HIGH (ref 65–99)
Potassium: 3.6 mmol/L (ref 3.5–5.1)
Sodium: 137 mmol/L (ref 135–145)

## 2016-03-14 LAB — PROTIME-INR
INR: 0.89
Prothrombin Time: 12 seconds (ref 11.4–15.2)

## 2016-03-14 LAB — HEPARIN LEVEL (UNFRACTIONATED): Heparin Unfractionated: 0.33 IU/mL (ref 0.30–0.70)

## 2016-03-14 LAB — MRSA PCR SCREENING: MRSA by PCR: NEGATIVE

## 2016-03-14 LAB — TROPONIN I: Troponin I: 0.57 ng/mL (ref <0.03)

## 2016-03-14 LAB — BRAIN NATRIURETIC PEPTIDE: B Natriuretic Peptide: 243.1 pg/mL — ABNORMAL HIGH (ref 0.0–100.0)

## 2016-03-14 MED ORDER — SODIUM CHLORIDE 0.9 % WEIGHT BASED INFUSION: 1.0000 mL/kg/h | INTRAVENOUS | Status: DC

## 2016-03-14 MED ORDER — SODIUM CHLORIDE 0.9% FLUSH: 3.0000 mL | INTRAVENOUS | Status: DC | PRN

## 2016-03-14 MED ORDER — ASPIRIN 300 MG RE SUPP: 300.0000 mg | RECTAL | Status: AC

## 2016-03-14 MED ORDER — HEPARIN (PORCINE) IN NACL 100-0.45 UNIT/ML-% IJ SOLN
800.0000 [IU]/h | INTRAMUSCULAR | Status: DC
  Administered 2016-03-14: 800 [IU]/h via INTRAVENOUS
  Filled 2016-03-14: qty 250

## 2016-03-14 MED ORDER — NITROGLYCERIN IN D5W 200-5 MCG/ML-% IV SOLN: 5.0000 ug/min | Freq: Once | INTRAVENOUS | Status: DC

## 2016-03-14 MED ORDER — ATORVASTATIN CALCIUM 80 MG PO TABS
80.0000 mg | ORAL_TABLET | Freq: Every day | ORAL | Status: DC
  Administered 2016-03-15: 80 mg via ORAL
  Filled 2016-03-14 (×2): qty 1

## 2016-03-14 MED ORDER — HEPARIN BOLUS VIA INFUSION
4000.0000 [IU] | Freq: Once | INTRAVENOUS | Status: AC
  Administered 2016-03-14: 4000 [IU] via INTRAVENOUS
  Filled 2016-03-14: qty 4000

## 2016-03-14 MED ORDER — ASPIRIN 81 MG PO CHEW: 324.0000 mg | CHEWABLE_TABLET | ORAL | Status: AC

## 2016-03-14 MED ORDER — ACETAMINOPHEN 325 MG PO TABS
650.0000 mg | ORAL_TABLET | ORAL | Status: DC | PRN
  Administered 2016-03-14 – 2016-03-16 (×4): 650 mg via ORAL
  Filled 2016-03-14 (×4): qty 2

## 2016-03-14 MED ORDER — LISINOPRIL 20 MG PO TABS
40.0000 mg | ORAL_TABLET | Freq: Every day | ORAL | Status: DC
  Administered 2016-03-15 – 2016-03-16 (×2): 40 mg via ORAL
  Filled 2016-03-14 (×2): qty 2

## 2016-03-14 MED ORDER — ASPIRIN EC 81 MG PO TBEC
81.0000 mg | DELAYED_RELEASE_TABLET | Freq: Every day | ORAL | Status: DC
  Administered 2016-03-15 – 2016-03-16 (×2): 81 mg via ORAL
  Filled 2016-03-14 (×2): qty 1

## 2016-03-14 MED ORDER — ONDANSETRON HCL 4 MG/2ML IJ SOLN: 4.0000 mg | Freq: Four times a day (QID) | INTRAMUSCULAR | Status: DC | PRN

## 2016-03-14 MED ORDER — SODIUM CHLORIDE 0.9 % IV SOLN: 250.0000 mL | INTRAVENOUS | Status: DC | PRN

## 2016-03-14 MED ORDER — ASPIRIN 81 MG PO CHEW
81.0000 mg | CHEWABLE_TABLET | ORAL | Status: AC
  Administered 2016-03-15: 81 mg via ORAL
  Filled 2016-03-14: qty 1

## 2016-03-14 MED ORDER — LEVOTHYROXINE SODIUM 100 MCG PO TABS
100.0000 ug | ORAL_TABLET | Freq: Every day | ORAL | Status: DC
  Administered 2016-03-15 – 2016-03-16 (×2): 100 ug via ORAL
  Filled 2016-03-14 (×2): qty 1

## 2016-03-14 MED ORDER — SODIUM CHLORIDE 0.9% FLUSH
3.0000 mL | Freq: Two times a day (BID) | INTRAVENOUS | Status: DC
  Administered 2016-03-15: 3 mL via INTRAVENOUS

## 2016-03-14 MED ORDER — ASPIRIN 81 MG PO CHEW
324.0000 mg | CHEWABLE_TABLET | Freq: Once | ORAL | Status: AC
  Administered 2016-03-14: 324 mg via ORAL
  Filled 2016-03-14: qty 4

## 2016-03-14 MED ORDER — CARVEDILOL 6.25 MG PO TABS
6.2500 mg | ORAL_TABLET | Freq: Two times a day (BID) | ORAL | Status: DC
  Administered 2016-03-14 – 2016-03-16 (×3): 6.25 mg via ORAL
  Filled 2016-03-14 (×3): qty 1

## 2016-03-14 MED ORDER — NITROGLYCERIN 0.4 MG SL SUBL: 0.4000 mg | SUBLINGUAL_TABLET | SUBLINGUAL | Status: DC | PRN

## 2016-03-14 MED ORDER — SODIUM CHLORIDE 0.9 % WEIGHT BASED INFUSION
3.0000 mL/kg/h | INTRAVENOUS | Status: AC
  Administered 2016-03-15: 3 mL/kg/h via INTRAVENOUS

## 2016-03-14 MED ORDER — INFLUENZA VAC SPLIT QUAD 0.5 ML IM SUSY: 0.5000 mL | PREFILLED_SYRINGE | INTRAMUSCULAR | Status: DC

## 2016-03-14 NOTE — ED Notes (Signed)
Report attempted 

## 2016-03-14 NOTE — ED Notes (Signed)
Cardiologist at bedside  Dr. Loletha Grayer

## 2016-03-14 NOTE — ED Notes (Signed)
Report given to [place].  

## 2016-03-14 NOTE — Progress Notes (Signed)
ANTICOAGULATION CONSULT NOTE - Initial Consult  Pharmacy Consult for heparin  Indication: chest pain/ACS  No Known Allergies  Patient Measurements: Height: 5\' 2"  (157.5 cm) Weight: 188 lb (85.3 kg) IBW/kg (Calculated) : 50.1 Heparin Dosing Weight: 69kg  Vital Signs: Temp: 97.5 F (36.4 C) (11/26 1410) Temp Source: Oral (11/26 1410) BP: 160/94 (11/26 1410) Pulse Rate: 110 (11/26 1410)  Labs: No results for input(s): HGB, HCT, PLT, APTT, LABPROT, INR, HEPARINUNFRC, HEPRLOWMOCWT, CREATININE, CKTOTAL, CKMB, TROPONINI in the last 72 hours.  CrCl cannot be calculated (No order found.).   Medical History: Past Medical History:  Diagnosis Date  . Hypertension   . Thyroid disease     Assessment: 75 yo female her with SOB and elevated troponin. Heparin has been ordered per MD. Pharmacy will manage heparin.  -heparin bolus of 4000 units ordered in the ED with infusion of 12 units/kg/hr  Goal of Therapy:  Heparin level 0.3-0.7 units/ml Monitor platelets by anticoagulation protocol: Yes   Plan:  -Change heparin infusion to 800 units/hr -Heparin level in 8 hours and daily wth CBC daily  Hildred Laser, Pharm D 03/14/2016 3:37 PM

## 2016-03-14 NOTE — ED Provider Notes (Signed)
[facility] DEPT Provider Note   CSN: [id number] Arrival date & time: 03/14/16  1403     History   Chief Complaint Chief Complaint  Patient presents with  . Shortness of Breath    HPI Michele Thompson is a 75 y.o. female.   Shortness of Breath   The patient is a pleasant 75 year old female with a history of hypertension and thyroid disease, she presents with one month of progressive shortness of breath. This is gradually worsening over the last week she is very dyspneic. She reports that when she climbs stairs or walks across the room in her house she becomes short of breath and has to deposit weight. She has a pressure or fullness in her neck when this occurs as well. As she rest the symptoms go away. She has also noted possible amount of swelling in her legs. There is no coughing fevers nausea vomiting diarrhea. She denies any radiation to the back or the shoulder or the arm. She has never had any problems with heart disease, lung disease, stroke, kidney or liver disease. She does not smoke and has no other significant family history of heart disease with both of her parents dying in their 0000000 from some complication of heart disease.    Past Medical History:  Diagnosis Date  . Hypertension   . Thyroid disease     There are no active problems to display for this patient.   Past Surgical History:  Procedure Laterality Date  . VAGINAL HYSTERECTOMY  1983   endometriosis    OB History    Gravida Para Term Preterm AB Living   2 2 2     2    SAB TAB Ectopic Multiple Live Births                   Home Medications    Prior to Admission medications   Medication Sig Start Date End Date Taking? Authorizing Provider  aspirin 81 MG tablet Take 81 mg by mouth daily.   Yes Historical Provider, MD  Cholecalciferol (VITAMIN D PO) Take by mouth.   Yes Historical Provider, MD  estradiol (VIVELLE-DOT) 0.05 MG/24HR patch PLACE 1 PATCH ONTO THE SKIN TWICE A WEEK Patient taking  differently: Place 1 patch onto the skin 2 (two) times a week. PLACE 1 PATCH ONTO THE SKIN TWICE A WEEK 02/13/16  Yes Timothy P Fontaine, MD  hydrochlorothiazide (MICROZIDE) 12.5 MG capsule Take 12.5 mg by mouth daily.   Yes Historical Provider, MD  levothyroxine (SYNTHROID, LEVOTHROID) 88 MCG tablet Take 100 mcg by mouth daily before breakfast.    Yes Historical Provider, MD  lisinopril (PRINIVIL,ZESTRIL) 40 MG tablet Take 80 mg by mouth daily.    Yes Historical Provider, MD    Family History Family History  Problem Relation Age of Onset  . Heart failure Mother   . Heart failure Father   . Heart failure Maternal Grandmother   . Myasthenia gravis Brother   . Stroke Brother     Social History Social History  Substance Use Topics  . Smoking status: Never Smoker  . Smokeless tobacco: Never Used  . Alcohol use No     Allergies   Patient has no known allergies.   Review of Systems Review of Systems  Respiratory: Positive for shortness of breath.   All other systems reviewed and are negative.    Physical Exam Updated Vital Signs BP (!) 139/102   Pulse 109   Temp 97.5 F (36.4 C) (Oral)  Resp 22   Ht 5\' 2"  (1.575 m)   Wt 188 lb (85.3 kg)   SpO2 97%   BMI 34.39 kg/m   Physical Exam  Constitutional: She appears well-developed and well-nourished. No distress.  HENT:  Head: Normocephalic and atraumatic.  Mouth/Throat: Oropharynx is clear and moist. No oropharyngeal exudate.  Eyes: Conjunctivae and EOM are normal. Pupils are equal, round, and reactive to light. Right eye exhibits no discharge. Left eye exhibits no discharge. No scleral icterus.  Neck: Normal range of motion. Neck supple. No JVD present. No thyromegaly present.  Cardiovascular: Normal rate, regular rhythm, normal heart sounds and intact distal pulses.  Exam reveals no gallop and no friction rub.   No murmur heard. Pulmonary/Chest: Effort normal and breath sounds normal. No respiratory distress. She has  no wheezes. She has no rales.  Abdominal: Soft. Bowel sounds are normal. She exhibits no distension and no mass. There is no tenderness.  Musculoskeletal: Normal range of motion. She exhibits no edema or tenderness.  Lymphadenopathy:    She has no cervical adenopathy.  Neurological: She is alert. Coordination normal.  Skin: Skin is warm and dry. No rash noted. No erythema.  Psychiatric: She has a normal mood and affect. Her behavior is normal.  Nursing note and vitals reviewed.    ED Treatments / Results  Labs (all labs ordered are listed, but only abnormal results are displayed) Labs Reviewed  BASIC METABOLIC PANEL - Abnormal; Notable for the following:       Result Value   CO2 20 (*)    Glucose, Bld 111 (*)    Calcium 8.8 (*)    GFR calc non Af Amer 59 (*)    All other components within normal limits  I-STAT TROPOININ, ED - Abnormal; Notable for the following:    Troponin i, poc 0.53 (*)    All other components within normal limits  CBC WITH DIFFERENTIAL/PLATELET  PROTIME-INR  BRAIN NATRIURETIC PEPTIDE  HEPARIN LEVEL (UNFRACTIONATED)    EKG  EKG Interpretation  Date/Time:  Sunday March 14 2016 14:13:00 EST Ventricular Rate:  113 PR Interval:  156 QRS Duration: 74 QT Interval:  334 QTC Calculation: 458 R Axis:   59 Text Interpretation:  Sinus tachycardia Anterior infarct , age undetermined Abnormal ECG No old tracing to compare Confirmed by [place]  MD, [place] ([id number]) on 03/14/2016 3:10:30 PM       Radiology Dg Chest Port 1 View  Result Date: 03/14/2016 CLINICAL DATA:  Shortness of Breath EXAM: PORTABLE CHEST 1 VIEW COMPARISON:  None. FINDINGS: The heart size and mediastinal contours are within normal limits. Both lungs are clear. The visualized skeletal structures are unremarkable. IMPRESSION: No active disease. Electronically Signed   By: Lahoma Crocker M.D.   On: 03/14/2016 16:03    Procedures Procedures (including critical care time)  Medications Ordered in  ED Medications  heparin ADULT infusion 100 units/mL (25000 units/240mL sodium chloride 0.45%) (800 Units/hr Intravenous New Bag/Given 03/14/16 1552)  heparin bolus via infusion 4,000 Units (4,000 Units Intravenous Bolus from Bag 03/14/16 1552)  aspirin chewable tablet 324 mg (324 mg Oral Given 03/14/16 1540)     Initial Impression / Assessment and Plan / ED Course  I have reviewed the triage vital signs and the nursing notes.  Pertinent labs & imaging results that were available during my care of the patient were reviewed by me and considered in my medical decision making (see chart for details).  Clinical Course     The patient's  EKG shows some T-wave abnormalities, I do not have an old EKG to compare this to. She has had progressive dyspnea on exertion which is consistent with coronary disease, a heaviness or tightness in her neck is also consistent with that. Her blood work has shown an elevated troponin consistent with a non-ST elevation myocardial infarction and she will need to be further evaluated with cardiology, she will need intravenous heparin, she is critically ill.  CRITICAL CARE Performed by: Johnna Acosta Total critical care time: 35  minutes Critical care time was exclusive of separately billable procedures and treating other patients. Critical care was necessary to treat or prevent imminent or life-threatening deterioration. Critical care was time spent personally by me on the following activities: development of treatment plan with patient and/or surrogate as well as nursing, discussions with consultants, evaluation of patient's response to treatment, examination of patient, obtaining history from patient or surrogate, ordering and performing treatments and interventions, ordering and review of laboratory studies, ordering and review of radiographic studies, pulse oximetry and re-evaluation of patient's condition.  D/w Will Camnitz of Cardiology - they will come to  admit  Medications  heparin ADULT infusion 100 units/mL (25000 units/252mL sodium chloride 0.45%) (800 Units/hr Intravenous New Bag/Given 03/14/16 1552)  heparin bolus via infusion 4,000 Units (4,000 Units Intravenous Bolus from Bag 03/14/16 1552)  aspirin chewable tablet 324 mg (324 mg Oral Given 03/14/16 1540)     Final Clinical Impressions(s) / ED Diagnoses   Final diagnoses:  NSTEMI (non-ST elevated myocardial infarction) [facility])    New Prescriptions New Prescriptions   No medications on file     Noemi Chapel, MD 03/14/16 1620

## 2016-03-14 NOTE — H&P (Signed)
Chief Complaint:  Crescendo Angina  HPI:  This is a 75 y.o. female with a past medical history significant for HTN, treated hypothyroidism presents with several weeks of exertional angina (throat tightening) and exertional dyspnea, but today with symptoms occurring at rest. ECG shows T wave inversion in V3-V4 and cardiac troponin I is slightly elevated.  Reports BP has recently been poorly controlled despite doubling ACEi dose. Denies syncope, palpitations, TIA/CVA symptoms, edema, orthopnea, cough or hemoptysis, claudication.  PMHx:  Past Medical History:  Diagnosis Date  . Hypertension   . Thyroid disease     Past Surgical History:  Procedure Laterality Date  . VAGINAL HYSTERECTOMY  1983   endometriosis    FAMHx:  Family History  Problem Relation Age of Onset  . Heart failure Mother   . Heart failure Father   . Heart failure Maternal Grandmother   . Myasthenia gravis Brother   . Stroke Brother     SOCHx:   reports that she has never smoked. She has never used smokeless tobacco. She reports that she does not drink alcohol or use drugs.  ALLERGIES:  No Known Allergies  ROS: Pertinent items noted in HPI and remainder of comprehensive ROS otherwise negative.  HOME MEDS: No current facility-administered medications on file prior to encounter.    Current Outpatient Prescriptions on File Prior to Encounter  Medication Sig Dispense Refill  . aspirin 81 MG tablet Take 81 mg by mouth daily.    . Cholecalciferol (VITAMIN D PO) Take by mouth.    . estradiol (VIVELLE-DOT) 0.05 MG/24HR patch PLACE 1 PATCH ONTO THE SKIN TWICE A WEEK (Patient taking differently: Place 1 patch onto the skin 2 (two) times a week. PLACE 1 PATCH ONTO THE SKIN TWICE A WEEK) 24 patch 4  . hydrochlorothiazide (MICROZIDE) 12.5 MG capsule Take 12.5 mg by mouth daily.    Marland Kitchen levothyroxine (SYNTHROID, LEVOTHROID) 88 MCG tablet Take 100 mcg by mouth daily before breakfast.     . lisinopril  (PRINIVIL,ZESTRIL) 40 MG tablet Take 80 mg by mouth daily.      LABS/IMAGING: Results for orders placed or performed during the hospital encounter of 03/14/16 (from the past 48 hour(s))  CBC with Differential     Status: None   Collection Time: 03/14/16  2:20 PM  Result Value Ref Range   WBC 8.1 4.0 - 10.5 K/uL   RBC 4.79 3.87 - 5.11 MIL/uL   Hemoglobin 15.0 12.0 - 15.0 g/dL   HCT 43.6 36.0 - 46.0 %   MCV 91.0 78.0 - 100.0 fL   MCH 31.3 26.0 - 34.0 pg   MCHC 34.4 30.0 - 36.0 g/dL   RDW 12.7 11.5 - 15.5 %   Platelets 229 150 - 400 K/uL   Neutrophils Relative % 64 %   Neutro Abs 5.2 1.7 - 7.7 K/uL   Lymphocytes Relative 29 %   Lymphs Abs 2.4 0.7 - 4.0 K/uL   Monocytes Relative 5 %   Monocytes Absolute 0.4 0.1 - 1.0 K/uL   Eosinophils Relative 1 %   Eosinophils Absolute 0.1 0.0 - 0.7 K/uL   Basophils Relative 1 %   Basophils Absolute 0.0 0.0 - 0.1 K/uL  Basic metabolic panel     Status: Abnormal   Collection Time: 03/14/16  2:20 PM  Result Value Ref Range   Sodium 137 135 - 145 mmol/L   Potassium 3.6 3.5 - 5.1 mmol/L   Chloride 107 101 - 111 mmol/L   CO2 20 (L) 22 -  32 mmol/L   Glucose, Bld 111 (H) 65 - 99 mg/dL   BUN 14 6 - 20 mg/dL   Creatinine, Ser 0.93 0.44 - 1.00 mg/dL   Calcium 8.8 (L) 8.9 - 10.3 mg/dL   GFR calc non Af Amer 59 (L) >60 mL/min   GFR calc Af Amer >60 >60 mL/min    Comment: (NOTE) The eGFR has been calculated using the CKD EPI equation. This calculation has not been validated in all clinical situations. eGFR's persistently <60 mL/min signify possible Chronic Kidney Disease.    Anion gap 10 5 - 15  Brain natriuretic peptide     Status: Abnormal   Collection Time: 03/14/16  2:20 PM  Result Value Ref Range   B Natriuretic Peptide 243.1 (H) 0.0 - 100.0 pg/mL  I-Stat Troponin, ED (not at [facility])     Status: Abnormal   Collection Time: 03/14/16  3:11 PM  Result Value Ref Range   Troponin i, poc 0.53 (HH) 0.00 - 0.08 ng/mL   Comment NOTIFIED PHYSICIAN     Comment 3            Comment: Due to the release kinetics of cTnI, a negative result within the first hours of the onset of symptoms does not rule out myocardial infarction with certainty. If myocardial infarction is still suspected, repeat the test at appropriate intervals.   Protime-INR     Status: None   Collection Time: 03/14/16  3:37 PM  Result Value Ref Range   Prothrombin Time 12.0 11.4 - 15.2 seconds   INR 0.89    Dg Chest Port 1 View  Result Date: 03/14/2016 CLINICAL DATA:  Shortness of Breath EXAM: PORTABLE CHEST 1 VIEW COMPARISON:  None. FINDINGS: The heart size and mediastinal contours are within normal limits. Both lungs are clear. The visualized skeletal structures are unremarkable. IMPRESSION: No active disease. Electronically Signed   By: Lahoma Crocker M.D.   On: 03/14/2016 16:03    VITALS: Blood pressure 152/89, pulse 89, temperature 97.5 F (36.4 C), temperature source Oral, resp. rate 13, height 5' 2"  (1.575 m), weight 188 lb (85.3 kg), SpO2 96 %.  EXAM:  General: Alert, oriented x3, no distress Head: no evidence of trauma, PERRL, EOMI, no exophtalmos or lid lag, no myxedema, no xanthelasma; normal ears, nose and oropharynx Neck: normal jugular venous pulsations and no hepatojugular reflux; brisk carotid pulses without delay and no carotid bruits Chest: clear to auscultation, no signs of consolidation by percussion or palpation, normal fremitus, symmetrical and full respiratory excursions Cardiovascular: normal position and quality of the apical impulse, regular rhythm, normal first heart sound and normal second heart sounds, no rubs or gallops, nomurmur Abdomen: no tenderness or distention, no masses by palpation, no abnormal pulsatility or arterial bruits, normal bowel sounds, no hepatosplenomegaly Extremities: no clubbing, cyanosis or edema; 2+ radial, ulnar and brachial pulses bilaterally; 2+ right femoral, posterior tibial and dorsalis pedis pulses; 2+ left  femoral, posterior tibial and dorsalis pedis pulses; no subclavian or femoral bruits Neurological: grossly nonfocal   IMPRESSION/PLAN: Unstable angina versus small NSTEMI with high risk ECG and enzymes, possibly large diagonal/ramus intermedius stenosis, cannot exclude mid LAD stenosis. Started heparin, ASA, statin, carvedilol, continue ACEi. Stop the diuretic . Hold ACEi for cath tomorrow, restart afterwards at a lower dose. Recommend early invasive evaluation and PCI if indicated. This procedure has been fully reviewed with the patient and informed consent has been obtained.   Sanda Klein, MD, [facility] [phone] office [phone] pager  03/14/2016, 5:05 PM

## 2016-03-15 ENCOUNTER — Encounter (HOSPITAL_COMMUNITY)
Admission: EM | Disposition: A | Discharge status: Home / Self Care | Payer: Self-pay | Source: Home / Self Care | Attending: Cardiovascular Disease

## 2016-03-15 DIAGNOSIS — E782 Mixed hyperlipidemia: Secondary | ICD-10-CM

## 2016-03-15 DIAGNOSIS — I214 Non-ST elevation (NSTEMI) myocardial infarction: Secondary | ICD-10-CM

## 2016-03-15 DIAGNOSIS — I2510 Atherosclerotic heart disease of native coronary artery without angina pectoris: Secondary | ICD-10-CM

## 2016-03-15 DIAGNOSIS — R9431 Abnormal electrocardiogram [ECG] [EKG]: Secondary | ICD-10-CM

## 2016-03-15 DIAGNOSIS — I119 Hypertensive heart disease without heart failure: Secondary | ICD-10-CM

## 2016-03-15 HISTORY — PX: CARDIAC CATHETERIZATION: SHX172

## 2016-03-15 LAB — CBC
HCT: 41.6 % (ref 36.0–46.0)
Hemoglobin: 14.1 g/dL (ref 12.0–15.0)
MCH: 30.9 pg (ref 26.0–34.0)
MCHC: 33.9 g/dL (ref 30.0–36.0)
MCV: 91 fL (ref 78.0–100.0)
Platelets: 177 10*3/uL (ref 150–400)
RBC: 4.57 MIL/uL (ref 3.87–5.11)
RDW: 13 % (ref 11.5–15.5)
WBC: 6.3 10*3/uL (ref 4.0–10.5)

## 2016-03-15 LAB — LIPID PANEL
Cholesterol: 142 mg/dL (ref 0–200)
HDL: 44 mg/dL (ref >40)
LDL Cholesterol: 83 mg/dL (ref 0–99)
Total CHOL/HDL Ratio: 3.2 RATIO
Triglycerides: 73 mg/dL (ref <150)
VLDL: 15 mg/dL (ref 0–40)

## 2016-03-15 LAB — PROTIME-INR
INR: 1.02
Prothrombin Time: 13.5 seconds (ref 11.4–15.2)

## 2016-03-15 LAB — HEMOGLOBIN A1C
Hgb A1c MFr Bld: 5.5 % (ref 4.8–5.6)
Mean Plasma Glucose: 111 mg/dL

## 2016-03-15 LAB — TROPONIN I
Troponin I: 0.35 ng/mL (ref <0.03)
Troponin I: 0.15 ng/mL (ref <0.03)

## 2016-03-15 LAB — HEPARIN LEVEL (UNFRACTIONATED): Heparin Unfractionated: 0.39 IU/mL (ref 0.30–0.70)

## 2016-03-15 SURGERY — LEFT HEART CATH AND CORONARY ANGIOGRAPHY
Anesthesia: LOCAL

## 2016-03-15 MED ORDER — LIDOCAINE HCL (PF) 1 % IJ SOLN
INTRAMUSCULAR | Status: DC | PRN
  Administered 2016-03-15: 2 mL via INTRADERMAL

## 2016-03-15 MED ORDER — SODIUM CHLORIDE 0.9% FLUSH: 3.0000 mL | INTRAVENOUS | Status: DC | PRN

## 2016-03-15 MED ORDER — IOPAMIDOL (ISOVUE-370) INJECTION 76%
INTRAVENOUS | Status: AC
  Filled 2016-03-15: qty 100

## 2016-03-15 MED ORDER — SODIUM CHLORIDE 0.9 % IV SOLN: 250.0000 mL | INTRAVENOUS | Status: DC | PRN

## 2016-03-15 MED ORDER — SODIUM CHLORIDE 0.9% FLUSH
3.0000 mL | Freq: Two times a day (BID) | INTRAVENOUS | Status: DC
  Administered 2016-03-15: 3 mL via INTRAVENOUS

## 2016-03-15 MED ORDER — IOPAMIDOL (ISOVUE-370) INJECTION 76%
INTRAVENOUS | Status: DC | PRN
  Administered 2016-03-15: 65 mL via INTRAVENOUS

## 2016-03-15 MED ORDER — HEPARIN SODIUM (PORCINE) 1000 UNIT/ML IJ SOLN
INTRAMUSCULAR | Status: DC | PRN
  Administered 2016-03-15: 5000 [IU] via INTRAVENOUS

## 2016-03-15 MED ORDER — LIDOCAINE HCL (PF) 1 % IJ SOLN
INTRAMUSCULAR | Status: AC
  Filled 2016-03-15: qty 30

## 2016-03-15 MED ORDER — FENTANYL CITRATE (PF) 100 MCG/2ML IJ SOLN
INTRAMUSCULAR | Status: DC | PRN
  Administered 2016-03-15: 25 ug via INTRAVENOUS

## 2016-03-15 MED ORDER — VERAPAMIL HCL 2.5 MG/ML IV SOLN
INTRAVENOUS | Status: AC
  Filled 2016-03-15: qty 2

## 2016-03-15 MED ORDER — HEPARIN (PORCINE) IN NACL 2-0.9 UNIT/ML-% IJ SOLN
INTRAMUSCULAR | Status: AC
  Filled 2016-03-15: qty 1000

## 2016-03-15 MED ORDER — MIDAZOLAM HCL 2 MG/2ML IJ SOLN
INTRAMUSCULAR | Status: AC
  Filled 2016-03-15: qty 2

## 2016-03-15 MED ORDER — FENTANYL CITRATE (PF) 100 MCG/2ML IJ SOLN
INTRAMUSCULAR | Status: AC
  Filled 2016-03-15: qty 2

## 2016-03-15 MED ORDER — SODIUM CHLORIDE 0.9 % WEIGHT BASED INFUSION: 1.0000 mL/kg/h | INTRAVENOUS | Status: AC

## 2016-03-15 MED ORDER — HEPARIN SODIUM (PORCINE) 1000 UNIT/ML IJ SOLN
INTRAMUSCULAR | Status: AC
  Filled 2016-03-15: qty 1

## 2016-03-15 MED ORDER — HEPARIN (PORCINE) IN NACL 2-0.9 UNIT/ML-% IJ SOLN
INTRAMUSCULAR | Status: DC | PRN
  Administered 2016-03-15: 1000 mL

## 2016-03-15 MED ORDER — MIDAZOLAM HCL 2 MG/2ML IJ SOLN
INTRAMUSCULAR | Status: DC | PRN
  Administered 2016-03-15: 2 mg via INTRAVENOUS

## 2016-03-15 SURGICAL SUPPLY — 11 items
CATH 5FR JL3.5 JR4 ANG PIG MP (CATHETERS) ×2 IMPLANT
DEVICE RAD COMP TR BAND LRG (VASCULAR PRODUCTS) ×2 IMPLANT
GLIDESHEATH SLEND SS 6F .021 (SHEATH) ×2 IMPLANT
GUIDEWIRE INQWIRE 1.5J.035X260 (WIRE) ×1 IMPLANT
INQWIRE 1.5J .035X260CM (WIRE) ×2
KIT HEART LEFT (KITS) ×2 IMPLANT
PACK CARDIAC CATHETERIZATION (CUSTOM PROCEDURE TRAY) ×2 IMPLANT
SYR MEDRAD MARK V 150ML (SYRINGE) ×2 IMPLANT
TRANSDUCER W/STOPCOCK (MISCELLANEOUS) ×2 IMPLANT
TUBING CIL FLEX 10 FLL-RA (TUBING) ×2 IMPLANT
WIRE HI TORQ VERSACORE-J 145CM (WIRE) ×2 IMPLANT

## 2016-03-15 NOTE — Progress Notes (Signed)
Pt in cath holding pending completion of an ongoing STEMI. Awake alert ,oriented, pain free. Heparin continues in light of delay.

## 2016-03-15 NOTE — Progress Notes (Signed)
Patient Name: Michele Thompson Date of Encounter: 03/15/2016  Primary Cardiologist: *Dr. [facility] Problem List     Active Problems:   Unstable angina pectoris [facility])     Subjective   No chest pain currently  Inpatient Medications    Scheduled Meds: . aspirin  324 mg Oral NOW   Or  . aspirin  300 mg Rectal NOW  . aspirin EC  81 mg Oral Daily  . atorvastatin  80 mg Oral q1800  . carvedilol  6.25 mg Oral BID WC  . Influenza vac split quadrivalent PF  0.5 mL Intramuscular Tomorrow-1000  . levothyroxine  100 mcg Oral QAC breakfast  . lisinopril  40 mg Oral Daily  . nitroGLYCERIN  5-200 mcg/min Intravenous Once  . sodium chloride flush  3 mL Intravenous Q12H   Continuous Infusions: . sodium chloride 1 mL/kg/hr (03/15/16 0800)  . heparin 800 Units/hr (03/15/16 0800)   PRN Meds: sodium chloride, acetaminophen, nitroGLYCERIN, ondansetron (ZOFRAN) IV, sodium chloride flush   Vital Signs    Vitals:   03/15/16 0000 03/15/16 0329 03/15/16 0740 03/15/16 0800  BP: (!) 148/81 (!) 144/85 (!) 154/86 (!) 160/89  Pulse: 86 92 67 69  Resp: 17 18 15 13   Temp:  98 F (36.7 C) 98.1 F (36.7 C)   TempSrc:  Oral Oral   SpO2: 97% 93% 95% 96%  Weight:      Height:        Intake/Output Summary (Last 24 hours) at 03/15/16 1015 Last data filed at 03/15/16 0800  Gross per 24 hour  Intake           266.84 ml  Output                0 ml  Net           266.84 ml   Filed Weights   03/14/16 1411  Weight: 188 lb (85.3 kg)    Physical Exam    GEN: Well nourished, well developed, in no acute distress.  HEENT: Grossly normal.  Neck: Supple, no JVD, carotid bruits, or masses. Cardiac: RRR, no murmurs, rubs, or gallops. No clubbing, cyanosis, edema.  Radials/DP/PT 2+ and equal bilaterally.  Respiratory:  Respirations regular and unlabored, clear to auscultation bilaterally. GI: Soft, nontender, nondistended, BS + x 4. MS: no deformity or atrophy. Skin: warm and dry, no  rash. Neuro:  Strength and sensation are intact. Psych: AAOx3.  Normal affect.  Labs    CBC  Recent Labs  03/14/16 1420 03/15/16 0524  WBC 8.1 6.3  NEUTROABS 5.2  --   HGB 15.0 14.1  HCT 43.6 41.6  MCV 91.0 91.0  PLT 229 123XX123   Basic Metabolic Panel  Recent Labs  03/14/16 1420  NA 137  K 3.6  CL 107  CO2 20*  GLUCOSE 111*  BUN 14  CREATININE 0.93  CALCIUM 8.8*   Liver Function Tests No results for input(s): AST, ALT, ALKPHOS, BILITOT, PROT, ALBUMIN in the last 72 hours. No results for input(s): LIPASE, AMYLASE in the last 72 hours. Cardiac Enzymes  Recent Labs  03/14/16 1800 03/14/16 2329 03/15/16 0524  TROPONINI 0.57* 0.35* 0.15*   BNP Invalid input(s): POCBNP D-Dimer No results for input(s): DDIMER in the last 72 hours. Hemoglobin A1C No results for input(s): HGBA1C in the last 72 hours. Fasting Lipid Panel  Recent Labs  03/15/16 0524  CHOL 142  HDL 44  LDLCALC 83  TRIG 73  CHOLHDL 3.2  Thyroid Function Tests No results for input(s): TSH, T4TOTAL, T3FREE, THYROIDAB in the last 72 hours.  Invalid input(s): FREET3  Telemetry    NSR - Personally Reviewed  ECG    Anterior T wave inversion- Personally Reviewed  Radiology    Dg Chest Port 1 View  Result Date: 03/14/2016 CLINICAL DATA:  Shortness of Breath EXAM: PORTABLE CHEST 1 VIEW COMPARISON:  None. FINDINGS: The heart size and mediastinal contours are within normal limits. Both lungs are clear. The visualized skeletal structures are unremarkable. IMPRESSION: No active disease. Electronically Signed   By: Lahoma Crocker M.D.   On: 03/14/2016 16:03    Cardiac Studies   Elevated troponin  Patient Profile     75 y/o with NSTEMI  Assessment & Plan    Plan for cath today.  All questions answered.  IV heparin until cath.  COntinue BP lowering drugs.  COuld increase carvedilol if BP stays high. Continue ACE-I.  Continue lipid lowering therapy.    She does have plans for knee  replacement.  Would consider Synergy stent so that DAPT could be stopped earlier than 1 year.  Signed, Larae Grooms, MD  03/15/2016, 10:15 AM

## 2016-03-15 NOTE — H&P (View-Only) (Signed)
Patient Name: Michele Thompson Date of Encounter: 03/15/2016  Primary Cardiologist: *Dr. [facility] Problem List     Active Problems:   Unstable angina pectoris [facility])     Subjective   No chest pain currently  Inpatient Medications    Scheduled Meds: . aspirin  324 mg Oral NOW   Or  . aspirin  300 mg Rectal NOW  . aspirin EC  81 mg Oral Daily  . atorvastatin  80 mg Oral q1800  . carvedilol  6.25 mg Oral BID WC  . Influenza vac split quadrivalent PF  0.5 mL Intramuscular Tomorrow-1000  . levothyroxine  100 mcg Oral QAC breakfast  . lisinopril  40 mg Oral Daily  . nitroGLYCERIN  5-200 mcg/min Intravenous Once  . sodium chloride flush  3 mL Intravenous Q12H   Continuous Infusions: . sodium chloride 1 mL/kg/hr (03/15/16 0800)  . heparin 800 Units/hr (03/15/16 0800)   PRN Meds: sodium chloride, acetaminophen, nitroGLYCERIN, ondansetron (ZOFRAN) IV, sodium chloride flush   Vital Signs    Vitals:   03/15/16 0000 03/15/16 0329 03/15/16 0740 03/15/16 0800  BP: (!) 148/81 (!) 144/85 (!) 154/86 (!) 160/89  Pulse: 86 92 67 69  Resp: 17 18 15 13   Temp:  98 F (36.7 C) 98.1 F (36.7 C)   TempSrc:  Oral Oral   SpO2: 97% 93% 95% 96%  Weight:      Height:        Intake/Output Summary (Last 24 hours) at 03/15/16 1015 Last data filed at 03/15/16 0800  Gross per 24 hour  Intake           266.84 ml  Output                0 ml  Net           266.84 ml   Filed Weights   03/14/16 1411  Weight: 188 lb (85.3 kg)    Physical Exam    GEN: Well nourished, well developed, in no acute distress.  HEENT: Grossly normal.  Neck: Supple, no JVD, carotid bruits, or masses. Cardiac: RRR, no murmurs, rubs, or gallops. No clubbing, cyanosis, edema.  Radials/DP/PT 2+ and equal bilaterally.  Respiratory:  Respirations regular and unlabored, clear to auscultation bilaterally. GI: Soft, nontender, nondistended, BS + x 4. MS: no deformity or atrophy. Skin: warm and dry, no  rash. Neuro:  Strength and sensation are intact. Psych: AAOx3.  Normal affect.  Labs    CBC  Recent Labs  03/14/16 1420 03/15/16 0524  WBC 8.1 6.3  NEUTROABS 5.2  --   HGB 15.0 14.1  HCT 43.6 41.6  MCV 91.0 91.0  PLT 229 123XX123   Basic Metabolic Panel  Recent Labs  03/14/16 1420  NA 137  K 3.6  CL 107  CO2 20*  GLUCOSE 111*  BUN 14  CREATININE 0.93  CALCIUM 8.8*   Liver Function Tests No results for input(s): AST, ALT, ALKPHOS, BILITOT, PROT, ALBUMIN in the last 72 hours. No results for input(s): LIPASE, AMYLASE in the last 72 hours. Cardiac Enzymes  Recent Labs  03/14/16 1800 03/14/16 2329 03/15/16 0524  TROPONINI 0.57* 0.35* 0.15*   BNP Invalid input(s): POCBNP D-Dimer No results for input(s): DDIMER in the last 72 hours. Hemoglobin A1C No results for input(s): HGBA1C in the last 72 hours. Fasting Lipid Panel  Recent Labs  03/15/16 0524  CHOL 142  HDL 44  LDLCALC 83  TRIG 73  CHOLHDL 3.2  Thyroid Function Tests No results for input(s): TSH, T4TOTAL, T3FREE, THYROIDAB in the last 72 hours.  Invalid input(s): FREET3  Telemetry    NSR - Personally Reviewed  ECG    Anterior T wave inversion- Personally Reviewed  Radiology    Dg Chest Port 1 View  Result Date: 03/14/2016 CLINICAL DATA:  Shortness of Breath EXAM: PORTABLE CHEST 1 VIEW COMPARISON:  None. FINDINGS: The heart size and mediastinal contours are within normal limits. Both lungs are clear. The visualized skeletal structures are unremarkable. IMPRESSION: No active disease. Electronically Signed   By: Lahoma Crocker M.D.   On: 03/14/2016 16:03    Cardiac Studies   Elevated troponin  Patient Profile     75 y/o with NSTEMI  Assessment & Plan    Plan for cath today.  All questions answered.  IV heparin until cath.  COntinue BP lowering drugs.  COuld increase carvedilol if BP stays high. Continue ACE-I.  Continue lipid lowering therapy.    She does have plans for knee  replacement.  Would consider Synergy stent so that DAPT could be stopped earlier than 1 year.  Signed, Larae Grooms, MD  03/15/2016, 10:15 AM

## 2016-03-15 NOTE — Interval H&P Note (Signed)
Cath Lab Visit (complete for each Cath Lab visit)  Clinical Evaluation Leading to the Procedure:   ACS: Yes.    Non-ACS:    Anginal Classification: CCS IV  Anti-ischemic medical therapy: No Therapy  Non-Invasive Test Results: No non-invasive testing performed  Prior CABG: No previous CABG      History and Physical Interval Note:  03/15/2016 4:22 PM  Michele Thompson  has presented today for surgery, with the diagnosis of unstable angina  The various methods of treatment have been discussed with the patient and family. After consideration of risks, benefits and other options for treatment, the patient has consented to  Procedure(s): Left Heart Cath and Coronary Angiography (N/A) as a surgical intervention .  The patient's history has been reviewed, patient examined, no change in status, stable for surgery.  I have reviewed the patient's chart and labs.  Questions were answered to the patient's satisfaction.     [name]

## 2016-03-15 NOTE — Progress Notes (Signed)
ANTICOAGULATION CONSULT NOTE  Pharmacy Consult for heparin  Indication: chest pain/ACS  No Known Allergies  Patient Measurements: Height: 5\' 2"  (157.5 cm) Weight: 188 lb (85.3 kg) IBW/kg (Calculated) : 50.1 Heparin Dosing Weight: 69kg  Vital Signs: Temp: 97.6 F (36.4 C) (11/27 1124) Temp Source: Oral (11/27 1124) BP: 155/76 (11/27 1130) Pulse Rate: 60 (11/27 1130)  Labs:  Recent Labs  03/14/16 1420 03/14/16 1537 03/14/16 1800 03/14/16 2329 03/15/16 0524  HGB 15.0  --   --   --  14.1  HCT 43.6  --   --   --  41.6  PLT 229  --   --   --  177  LABPROT  --  12.0  --   --  13.5  INR  --  0.89  --   --  1.02  HEPARINUNFRC  --   --   --  0.33 0.39  CREATININE 0.93  --   --   --   --   TROPONINI  --   --  0.57* 0.35* 0.15*    Estimated Creatinine Clearance: 53 mL/min (by C-G formula based on SCr of 0.93 mg/dL).   Medical History: Past Medical History:  Diagnosis Date  . Hypertension   . Thyroid disease     Assessment: 75 yo female her with SOB and elevated troponin. Heparin has been ordered, patient for cath this afternoon.   Heparin level at goal this am, without complications.   Goal of Therapy:  Heparin level 0.3-0.7 units/ml Monitor platelets by anticoagulation protocol: Yes   Plan:  -Continue heparin infusion at 800 units/hr -Follow up after cath  Erin Hearing PharmD., BCPS Clinical Pharmacist Pager [phone] 03/15/2016 1:05 PM

## 2016-03-15 NOTE — Progress Notes (Signed)
ANTICOAGULATION CONSULT NOTE Pharmacy Consult for heparin  Indication: chest pain/ACS  No Known Allergies  Patient Measurements: Height: 5\' 2"  (157.5 cm) Weight: 188 lb (85.3 kg) IBW/kg (Calculated) : 50.1 Heparin Dosing Weight: 69kg  Vital Signs: Temp: 97.8 F (36.6 C) (11/26 2326) Temp Source: Oral (11/26 2326) BP: 148/81 (11/27 0000) Pulse Rate: 86 (11/27 0000)  Labs:  Recent Labs  03/14/16 1420 03/14/16 1537 03/14/16 1800 03/14/16 2329  HGB 15.0  --   --   --   HCT 43.6  --   --   --   PLT 229  --   --   --   LABPROT  --  12.0  --   --   INR  --  0.89  --   --   HEPARINUNFRC  --   --   --  0.33  CREATININE 0.93  --   --   --   TROPONINI  --   --  0.57*  --     Estimated Creatinine Clearance: 53 mL/min (by C-G formula based on SCr of 0.93 mg/dL).  Assessment: 75 y.o. female with ACS/NSTEMI for heparin  Goal of Therapy:  Heparin level 0.3-0.7 units/ml Monitor platelets by anticoagulation protocol: Yes   Plan:  Continue Heparin at current rate Follow-up am labs.  Phillis Knack, PharmD, BCPS  03/15/2016 12:18 AM

## 2016-03-16 ENCOUNTER — Encounter (HOSPITAL_COMMUNITY): Payer: Self-pay | Admitting: Cardiovascular Disease

## 2016-03-16 DIAGNOSIS — E7800 Pure hypercholesterolemia, unspecified: Secondary | ICD-10-CM

## 2016-03-16 DIAGNOSIS — I10 Essential (primary) hypertension: Secondary | ICD-10-CM

## 2016-03-16 DIAGNOSIS — I249 Acute ischemic heart disease, unspecified: Secondary | ICD-10-CM

## 2016-03-16 DIAGNOSIS — E785 Hyperlipidemia, unspecified: Secondary | ICD-10-CM

## 2016-03-16 DIAGNOSIS — I2510 Atherosclerotic heart disease of native coronary artery without angina pectoris: Secondary | ICD-10-CM

## 2016-03-16 HISTORY — DX: Atherosclerotic heart disease of native coronary artery without angina pectoris: I25.10

## 2016-03-16 HISTORY — DX: Hyperlipidemia, unspecified: E78.5

## 2016-03-16 MED ORDER — AMLODIPINE BESYLATE 5 MG PO TABS
5.0000 mg | ORAL_TABLET | Freq: Every day | ORAL | Status: DC
  Administered 2016-03-16: 5 mg via ORAL
  Filled 2016-03-16: qty 1

## 2016-03-16 MED ORDER — LISINOPRIL 40 MG PO TABS: 40.0000 mg | ORAL_TABLET | Freq: Every day | ORAL | 6 refills | Status: DC

## 2016-03-16 MED ORDER — AMLODIPINE BESYLATE 5 MG PO TABS: 5.0000 mg | ORAL_TABLET | Freq: Every day | ORAL | 6 refills | Status: DC

## 2016-03-16 MED ORDER — ATORVASTATIN CALCIUM 80 MG PO TABS: 80.0000 mg | ORAL_TABLET | Freq: Every day | ORAL | 6 refills | Status: DC

## 2016-03-16 MED ORDER — CARVEDILOL 6.25 MG PO TABS: 6.2500 mg | ORAL_TABLET | Freq: Two times a day (BID) | ORAL | 6 refills | Status: DC

## 2016-03-16 MED FILL — Verapamil HCl IV Soln 2.5 MG/ML: INTRAVENOUS | Qty: 2 | Status: AC

## 2016-03-16 NOTE — Progress Notes (Signed)
Patient ambulated 200+ feet on RA with RN. PT reported some SOB, but O2 sats maintained at 96% and above for entire walk. Dr. Rockwell Germany text paged and made aware.

## 2016-03-16 NOTE — Progress Notes (Signed)
Pt. Discharged to home via wc. Discharge instructions and medication regimen reviewed at bedside with patient. Pt. verbalizes understanding of instructions and medication regimen. Prescriptions sent to pharmacy, pt and son aware. Patient assessment unchanged from this morning. TELE and IV discontinued per policy.

## 2016-03-16 NOTE — Discharge Summary (Signed)
Discharge Summary    Patient ID: Michele Thompson,  MRN: [id number], DOB/AGE: 07-02-1940 75 y.o.  Admit date: 03/14/2016 Discharge date: 03/16/2016  Primary Care Provider: Tawanna Solo Primary Cardiologist: Dr. Sallyanne Kuster  Discharge Diagnoses    Active Problems:   Unstable angina pectoris [facility])   Hyperlipidemia   Hypertension   Allergies No Known Allergies  Diagnostic Studies/Procedures    LHC: 03/16/16  Conclusion   1. Mild non-obstructive CAD, mid-LAD irregularity 2. Widely patent left main, left circumflex, and RCA 3. Normal LV function   No clear etiology to explain exertional or resting angina. The patient may have  microvascular angina. A trial of Ca++ Channel blockade may be reasonable.   _____________   History of Present Illness     75 y.o. female with a past medical history significant for HTN, treated hypothyroidism presents with several weeks of exertional angina (throat tightening) and exertional dyspnea, but presented with symptoms occurring at rest. ECG showed T wave inversion in V3-V4 and cardiac troponin I is slightly elevated.  Reports BP has recently been poorly controlled despite doubling ACEi dose. Denies syncope, palpitations, TIA/CVA symptoms, edema, orthopnea, cough or hemoptysis, claudication.  Hospital Course     Consultants: None  Given her reported symptoms she was admitted with plans for cardiac catheterization with PCI if needed. She was started on ASA, statin, coreg in the setting of ACS. On 03/15/16 she underwent LHC with Dr. Burt Thompson that showed mild non-obstructive CAD with widely patent vessels, and normal LV function. No clear etiology was noted for her cause of angina. She was started on amlodipine in the setting of possible microvascular disease. Labs were noted to be stable post procedure.   Given her dyspnea, risk for PE was considered but she did not have any concerning risk factors. Pain was felt to be caused by vasospasm.  Also, was noted to have poor blood pressure control prior to admission. Blood pressure improved with the addition of amlodipine. BNP was elevated on admission, but LVEDP was normal at the time of cath. She was able to ambulate without significant dyspnea on the day of discharge.   On 11/28 she was seen and examined by Dr. Irish Thompson and determined stable for discharge home. I have arranged for 2 week follow up in the office.  _____________  Discharge Vitals Blood pressure 134/66, pulse 65, temperature 97.9 F (36.6 C), temperature source Oral, resp. rate 17, height 5\' 2"  (1.575 m), weight 188 lb (85.3 kg), SpO2 100 %.  Filed Weights   03/14/16 1411  Weight: 188 lb (85.3 kg)    Labs & Radiologic Studies    CBC  Recent Labs  03/14/16 1420 03/15/16 0524  WBC 8.1 6.3  NEUTROABS 5.2  --   HGB 15.0 14.1  HCT 43.6 41.6  MCV 91.0 91.0  PLT 229 123XX123   Basic Metabolic Panel  Recent Labs  03/14/16 1420  NA 137  K 3.6  CL 107  CO2 20*  GLUCOSE 111*  BUN 14  CREATININE 0.93  CALCIUM 8.8*   Liver Function Tests No results for input(s): AST, ALT, ALKPHOS, BILITOT, PROT, ALBUMIN in the last 72 hours. No results for input(s): LIPASE, AMYLASE in the last 72 hours. Cardiac Enzymes  Recent Labs  03/14/16 1800 03/14/16 2329 03/15/16 0524  TROPONINI 0.57* 0.35* 0.15*   BNP Invalid input(s): POCBNP D-Dimer No results for input(s): DDIMER in the last 72 hours. Hemoglobin A1C  Recent Labs  03/14/16 1800  HGBA1C 5.5  Fasting Lipid Panel  Recent Labs  03/15/16 0524  CHOL 142  HDL 44  LDLCALC 83  TRIG 73  CHOLHDL 3.2   Thyroid Function Tests No results for input(s): TSH, T4TOTAL, T3FREE, THYROIDAB in the last 72 hours.  Invalid input(s): FREET3 _____________  Dg Chest Port 1 View  Result Date: 03/14/2016 CLINICAL DATA:  Shortness of Breath EXAM: PORTABLE CHEST 1 VIEW COMPARISON:  None. FINDINGS: The heart size and mediastinal contours are within normal limits.  Both lungs are clear. The visualized skeletal structures are unremarkable. IMPRESSION: No active disease. Electronically Signed   By: Michele Thompson M.D.   On: 03/14/2016 16:03   Disposition   Pt is being discharged home today in good condition.  Follow-up Plans & Appointments    Follow-up Information    Michele Thompson, Michele Marker, PA-C Follow up on 03/31/2016.   Specialties:  Cardiology, Radiology Why:  at 2:15pm for your hospital follow up appt.  Contact information: [address] [place] [address] [phone]          Discharge Instructions    Call MD for:  redness, tenderness, or signs of infection (pain, swelling, redness, odor or green/yellow discharge around incision site)    Complete by:  As directed    Diet - low sodium heart healthy    Complete by:  As directed    Discharge instructions    Complete by:  As directed    Radial Site Care Refer to this sheet in the next few weeks. These instructions provide you with information on caring for yourself after your procedure. Your caregiver may also give you more specific instructions. Your treatment has been planned according to current medical practices, but problems sometimes occur. Call your caregiver if you have any problems or questions after your procedure. HOME CARE INSTRUCTIONS You may shower the day after the procedure.Remove the bandage (dressing) and gently wash the site with plain soap and water.Gently pat the site dry.  Do not apply powder or lotion to the site.  Do not submerge the affected site in water for 3 to 5 days.  Inspect the site at least twice daily.  Do not flex or bend the affected arm for 24 hours.  No lifting over 5 pounds (2.3 kg) for 5 days after your procedure.  Do not drive home if you are discharged the same day of the procedure. Have someone else drive you.  You may drive 24 hours after the procedure unless otherwise instructed by your caregiver.  What to expect: Any bruising will  usually fade within 1 to 2 weeks.  Blood that collects in the tissue (hematoma) may be painful to the touch. It should usually decrease in size and tenderness within 1 to 2 weeks.  SEEK IMMEDIATE MEDICAL CARE IF: You have unusual pain at the radial site.  You have redness, warmth, swelling, or pain at the radial site.  You have drainage (other than a small amount of blood on the dressing).  You have chills.  You have a fever or persistent symptoms for more than 72 hours.  You have a fever and your symptoms suddenly get worse.  Your arm becomes pale, cool, tingly, or numb.  You have heavy bleeding from the site. Hold pressure on the site.   Increase activity slowly    Complete by:  As directed       Discharge Medications   Current Discharge Medication List    START taking these medications   Details  amLODipine (NORVASC) 5 MG tablet Take 1 tablet (5 mg total) by mouth daily. Qty: 30 tablet, Refills: 6    atorvastatin (LIPITOR) 80 MG tablet Take 1 tablet (80 mg total) by mouth daily at 6 PM. Qty: 30 tablet, Refills: 6    carvedilol (COREG) 6.25 MG tablet Take 1 tablet (6.25 mg total) by mouth 2 (two) times daily with a meal. Qty: 60 tablet, Refills: 6      CONTINUE these medications which have CHANGED   Details  lisinopril (PRINIVIL,ZESTRIL) 40 MG tablet Take 1 tablet (40 mg total) by mouth daily. Qty: 30 tablet, Refills: 6      CONTINUE these medications which have NOT CHANGED   Details  aspirin 81 MG tablet Take 81 mg by mouth daily.    Cholecalciferol (VITAMIN D PO) Take by mouth.    estradiol (VIVELLE-DOT) 0.05 MG/24HR patch PLACE 1 PATCH ONTO THE SKIN TWICE A WEEK Qty: 24 patch, Refills: 4    hydrochlorothiazide (MICROZIDE) 12.5 MG capsule Take 12.5 mg by mouth daily.    levothyroxine (SYNTHROID, LEVOTHROID) 100 MCG tablet Take 100 mcg by mouth daily before breakfast.         Aspirin prescribed at discharge?  Yes High Intensity Statin Prescribed? (Lipitor  40-80mg  or Crestor 20-40mg ): Yes Beta Blocker Prescribed? Yes For EF <40%, was ACEI/ARB Prescribed? Yes ADP Receptor Inhibitor Prescribed? (i.e. Plavix etc.-Includes Medically Managed Patients): No: No CAD For EF <40%, Aldosterone Inhibitor Prescribed? No: EF ok Was EF assessed during THIS hospitalization? Yes Was Cardiac Rehab II ordered? (Included Medically managed Patients): No: No CAD noted   Outstanding Labs/Studies   FLP and LFTs in 6-8 weeks if able to tolerate statin  Duration of Discharge Encounter   Greater than 30 minutes including physician time.  Signed, Reino Bellis NP-C 03/16/2016, 2:12 PM   I have examined the patient and reviewed assessment and plan and discussed with patient.  Agree with above as stated.  After amlodipine started, BP readings improved.  Patient walked and oxygen sats were maintained at 97%.  Encouraged her to try to increase walking gradually.  Amlodipine shoud help with HTN and vasospasm.  F/u with Dr. Sallyanne Kuster.    Michele Thompson

## 2016-03-16 NOTE — Progress Notes (Signed)
Patient Name: Michele Thompson Date of Encounter: 03/16/2016  Primary Cardiologist: *Dr. [facility] Problem List     Active Problems:   Unstable angina pectoris [facility])     Subjective   No chest pain currently.  Still SHOB with minimal exertion.  Cath was essentially clean.  LVEDP was low.    Inpatient Medications    Scheduled Meds: . amLODipine  5 mg Oral Daily  . aspirin EC  81 mg Oral Daily  . atorvastatin  80 mg Oral q1800  . carvedilol  6.25 mg Oral BID WC  . Influenza vac split quadrivalent PF  0.5 mL Intramuscular Tomorrow-1000  . levothyroxine  100 mcg Oral QAC breakfast  . lisinopril  40 mg Oral Daily  . nitroGLYCERIN  5-200 mcg/min Intravenous Once  . sodium chloride flush  3 mL Intravenous Q12H   Continuous Infusions:  PRN Meds: sodium chloride, acetaminophen, nitroGLYCERIN, ondansetron (ZOFRAN) IV, sodium chloride flush   Vital Signs    Vitals:   03/15/16 2200 03/15/16 2300 03/16/16 0300 03/16/16 0733  BP: 109/70 122/61 (!) 144/80 139/79  Pulse: 70 66 70 72  Resp:  15 16 13   Temp:  97.7 F (36.5 C) 98.2 F (36.8 C) 98.1 F (36.7 C)  TempSrc:  Oral Oral Oral  SpO2:  96% 96% 95%  Weight:      Height:        Intake/Output Summary (Last 24 hours) at 03/16/16 0947 Last data filed at 03/16/16 0600  Gross per 24 hour  Intake           1330.3 ml  Output              350 ml  Net            980.3 ml   Filed Weights   03/14/16 1411  Weight: 188 lb (85.3 kg)    Physical Exam    GEN: Well nourished, well developed, in no acute distress.  HEENT: Grossly normal.  Neck: Supple, no JVD, carotid bruits, or masses. Cardiac: RRR, no murmurs, rubs, or gallops. No clubbing, cyanosis, edema.  Radials/DP/PT 2+ and equal bilaterally. No right radial hematoma. Respiratory:  Respirations regular and unlabored, clear to auscultation bilaterally. GI: Soft, nontender, nondistended, BS + x 4. MS: no deformity or atrophy. Skin: warm and dry, no rash. Neuro:   Strength and sensation are intact. Psych: AAOx3.  Normal affect.  Labs    CBC  Recent Labs  03/14/16 1420 03/15/16 0524  WBC 8.1 6.3  NEUTROABS 5.2  --   HGB 15.0 14.1  HCT 43.6 41.6  MCV 91.0 91.0  PLT 229 123XX123   Basic Metabolic Panel  Recent Labs  03/14/16 1420  NA 137  K 3.6  CL 107  CO2 20*  GLUCOSE 111*  BUN 14  CREATININE 0.93  CALCIUM 8.8*   Liver Function Tests No results for input(s): AST, ALT, ALKPHOS, BILITOT, PROT, ALBUMIN in the last 72 hours. No results for input(s): LIPASE, AMYLASE in the last 72 hours. Cardiac Enzymes  Recent Labs  03/14/16 1800 03/14/16 2329 03/15/16 0524  TROPONINI 0.57* 0.35* 0.15*   BNP Invalid input(s): POCBNP D-Dimer No results for input(s): DDIMER in the last 72 hours. Hemoglobin A1C  Recent Labs  03/14/16 1800  HGBA1C 5.5   Fasting Lipid Panel  Recent Labs  03/15/16 0524  CHOL 142  HDL 44  LDLCALC 83  TRIG 73  CHOLHDL 3.2   Thyroid Function Tests No results for  input(s): TSH, T4TOTAL, T3FREE, THYROIDAB in the last 72 hours.  Invalid input(s): FREET3  Telemetry    NSR - Personally Reviewed  ECG    Anterior T wave inversion- Personally Reviewed  Radiology    Dg Chest Port 1 View  Result Date: 03/14/2016 CLINICAL DATA:  Shortness of Breath EXAM: PORTABLE CHEST 1 VIEW COMPARISON:  None. FINDINGS: The heart size and mediastinal contours are within normal limits. Both lungs are clear. The visualized skeletal structures are unremarkable. IMPRESSION: No active disease. Electronically Signed   By: Lahoma Crocker M.D.   On: 03/14/2016 16:03    Cardiac Studies   Elevated troponin  Patient Profile     75 y/o with NSTEMI  Assessment & Plan    No signficant disease noted at cath yesterday.  No clear etiology for symptoms.  We discussed risk factors for pulmonary embolism. She has not had any long car trips. She has not noticed any significant lower extremity swelling. She has no pain in her legs. We  discussed this with her son and daughter-in-law. Potentially, vasospasm is an etiology for chest discomfort. She has also had poorly controlled blood pressure. Add amlodipine 5 mg daily for both issues. All questions answered.    Continue lipid lowering therapy.    She does have plans for knee replacement. We'll try to get her walking today. If she is able to walk around the unit without significant shortness of breath, with likely discharge the patient today. Although her BNP was mildly elevated, her LVEDP at time of cardiac cath was normal.  We'll not plan for any IV Lasix today.  Signed, Larae Grooms, MD  03/16/2016, 9:47 AM

## 2016-03-17 ENCOUNTER — Inpatient Hospital Stay (HOSPITAL_COMMUNITY): Payer: Medicare Other

## 2016-03-17 ENCOUNTER — Inpatient Hospital Stay (HOSPITAL_COMMUNITY)
Admission: EM | Admit: 2016-03-17 | Discharge: 2016-03-21 | DRG: 208 | Disposition: A | Discharge status: Home Health | Payer: Medicare Other | Attending: Internal Medicine | Admitting: Internal Medicine

## 2016-03-17 ENCOUNTER — Emergency Department (HOSPITAL_COMMUNITY): Payer: Medicare Other

## 2016-03-17 ENCOUNTER — Encounter (HOSPITAL_COMMUNITY): Payer: Self-pay | Admitting: Emergency Medicine

## 2016-03-17 ENCOUNTER — Other Ambulatory Visit (HOSPITAL_COMMUNITY): Payer: Self-pay

## 2016-03-17 DIAGNOSIS — Z7982 Long term (current) use of aspirin: Secondary | ICD-10-CM

## 2016-03-17 DIAGNOSIS — I82401 Acute embolism and thrombosis of unspecified deep veins of right lower extremity: Secondary | ICD-10-CM | POA: Diagnosis present

## 2016-03-17 DIAGNOSIS — I82621 Acute embolism and thrombosis of deep veins of right upper extremity: Secondary | ICD-10-CM | POA: Diagnosis present

## 2016-03-17 DIAGNOSIS — R778 Other specified abnormalities of plasma proteins: Secondary | ICD-10-CM | POA: Diagnosis present

## 2016-03-17 DIAGNOSIS — D7582 Heparin induced thrombocytopenia (HIT): Secondary | ICD-10-CM | POA: Diagnosis present

## 2016-03-17 DIAGNOSIS — K625 Hemorrhage of anus and rectum: Secondary | ICD-10-CM | POA: Diagnosis present

## 2016-03-17 DIAGNOSIS — E7800 Pure hypercholesterolemia, unspecified: Secondary | ICD-10-CM | POA: Diagnosis present

## 2016-03-17 DIAGNOSIS — E785 Hyperlipidemia, unspecified: Secondary | ICD-10-CM | POA: Diagnosis present

## 2016-03-17 DIAGNOSIS — K5900 Constipation, unspecified: Secondary | ICD-10-CM | POA: Diagnosis present

## 2016-03-17 DIAGNOSIS — Z9911 Dependence on respirator [ventilator] status: Secondary | ICD-10-CM

## 2016-03-17 DIAGNOSIS — I25119 Atherosclerotic heart disease of native coronary artery with unspecified angina pectoris: Secondary | ICD-10-CM | POA: Diagnosis present

## 2016-03-17 DIAGNOSIS — I2699 Other pulmonary embolism without acute cor pulmonale: Secondary | ICD-10-CM | POA: Diagnosis present

## 2016-03-17 DIAGNOSIS — E039 Hypothyroidism, unspecified: Secondary | ICD-10-CM | POA: Diagnosis present

## 2016-03-17 DIAGNOSIS — J9601 Acute respiratory failure with hypoxia: Secondary | ICD-10-CM | POA: Insufficient documentation

## 2016-03-17 DIAGNOSIS — E669 Obesity, unspecified: Secondary | ICD-10-CM | POA: Diagnosis present

## 2016-03-17 DIAGNOSIS — R0602 Shortness of breath: Secondary | ICD-10-CM

## 2016-03-17 DIAGNOSIS — R0902 Hypoxemia: Secondary | ICD-10-CM | POA: Diagnosis not present

## 2016-03-17 DIAGNOSIS — D696 Thrombocytopenia, unspecified: Secondary | ICD-10-CM | POA: Diagnosis present

## 2016-03-17 DIAGNOSIS — E038 Other specified hypothyroidism: Secondary | ICD-10-CM | POA: Diagnosis not present

## 2016-03-17 DIAGNOSIS — I10 Essential (primary) hypertension: Secondary | ICD-10-CM | POA: Diagnosis present

## 2016-03-17 DIAGNOSIS — E876 Hypokalemia: Secondary | ICD-10-CM | POA: Diagnosis present

## 2016-03-17 DIAGNOSIS — R262 Difficulty in walking, not elsewhere classified: Secondary | ICD-10-CM

## 2016-03-17 DIAGNOSIS — Z79899 Other long term (current) drug therapy: Secondary | ICD-10-CM

## 2016-03-17 DIAGNOSIS — Z90710 Acquired absence of both cervix and uterus: Secondary | ICD-10-CM

## 2016-03-17 DIAGNOSIS — I214 Non-ST elevation (NSTEMI) myocardial infarction: Secondary | ICD-10-CM | POA: Diagnosis present

## 2016-03-17 DIAGNOSIS — E784 Other hyperlipidemia: Secondary | ICD-10-CM

## 2016-03-17 DIAGNOSIS — R51 Headache: Secondary | ICD-10-CM

## 2016-03-17 DIAGNOSIS — Z86718 Personal history of other venous thrombosis and embolism: Secondary | ICD-10-CM

## 2016-03-17 DIAGNOSIS — Z6837 Body mass index (BMI) 37.0-37.9, adult: Secondary | ICD-10-CM

## 2016-03-17 DIAGNOSIS — T45515A Adverse effect of anticoagulants, initial encounter: Secondary | ICD-10-CM | POA: Diagnosis present

## 2016-03-17 DIAGNOSIS — R570 Cardiogenic shock: Secondary | ICD-10-CM | POA: Diagnosis not present

## 2016-03-17 DIAGNOSIS — I469 Cardiac arrest, cause unspecified: Secondary | ICD-10-CM | POA: Diagnosis not present

## 2016-03-17 DIAGNOSIS — R7989 Other specified abnormal findings of blood chemistry: Secondary | ICD-10-CM

## 2016-03-17 DIAGNOSIS — N179 Acute kidney failure, unspecified: Secondary | ICD-10-CM | POA: Diagnosis present

## 2016-03-17 DIAGNOSIS — I509 Heart failure, unspecified: Secondary | ICD-10-CM | POA: Diagnosis not present

## 2016-03-17 DIAGNOSIS — G931 Anoxic brain damage, not elsewhere classified: Secondary | ICD-10-CM | POA: Diagnosis not present

## 2016-03-17 DIAGNOSIS — Z452 Encounter for adjustment and management of vascular access device: Secondary | ICD-10-CM

## 2016-03-17 DIAGNOSIS — R519 Headache, unspecified: Secondary | ICD-10-CM

## 2016-03-17 DIAGNOSIS — R748 Abnormal levels of other serum enzymes: Secondary | ICD-10-CM | POA: Diagnosis not present

## 2016-03-17 HISTORY — DX: Atherosclerotic heart disease of native coronary artery without angina pectoris: I25.10

## 2016-03-17 HISTORY — DX: Other pulmonary embolism without acute cor pulmonale: I26.99

## 2016-03-17 LAB — BASIC METABOLIC PANEL
Anion gap: 13 (ref 5–15)
Anion gap: 13 (ref 5–15)
BUN: 12 mg/dL (ref 6–20)
BUN: 9 mg/dL (ref 6–20)
CO2: 15 mmol/L — ABNORMAL LOW (ref 22–32)
CO2: 15 mmol/L — ABNORMAL LOW (ref 22–32)
Calcium: 9 mg/dL (ref 8.9–10.3)
Calcium: 6.6 mg/dL — ABNORMAL LOW (ref 8.9–10.3)
Chloride: 113 mmol/L — ABNORMAL HIGH (ref 101–111)
Chloride: 112 mmol/L — ABNORMAL HIGH (ref 101–111)
Creatinine, Ser: 1 mg/dL (ref 0.44–1.00)
Creatinine, Ser: 1.28 mg/dL — ABNORMAL HIGH (ref 0.44–1.00)
GFR calc Af Amer: >60 mL/min (ref >60)
GFR calc Af Amer: 46 mL/min — ABNORMAL LOW (ref >60)
GFR calc non Af Amer: 54 mL/min — ABNORMAL LOW (ref >60)
GFR calc non Af Amer: 40 mL/min — ABNORMAL LOW (ref >60)
Glucose, Bld: 117 mg/dL — ABNORMAL HIGH (ref 65–99)
Glucose, Bld: 288 mg/dL — ABNORMAL HIGH (ref 65–99)
Potassium: 3.5 mmol/L (ref 3.5–5.1)
Potassium: 4.3 mmol/L (ref 3.5–5.1)
Sodium: 141 mmol/L (ref 135–145)
Sodium: 140 mmol/L (ref 135–145)

## 2016-03-17 LAB — CBC
HCT: 39.9 % (ref 36.0–46.0)
HCT: 36.8 % (ref 36.0–46.0)
HCT: 37.5 % (ref 36.0–46.0)
HCT: 29.5 % — ABNORMAL LOW (ref 36.0–46.0)
HCT: 37.8 % (ref 36.0–46.0)
Hemoglobin: 13.3 g/dL (ref 12.0–15.0)
Hemoglobin: 12.4 g/dL (ref 12.0–15.0)
Hemoglobin: 12.3 g/dL (ref 12.0–15.0)
Hemoglobin: 9.8 g/dL — ABNORMAL LOW (ref 12.0–15.0)
Hemoglobin: 12.6 g/dL (ref 12.0–15.0)
MCH: 30.6 pg (ref 26.0–34.0)
MCH: 30.9 pg (ref 26.0–34.0)
MCH: 30.8 pg (ref 26.0–34.0)
MCH: 31.1 pg (ref 26.0–34.0)
MCH: 31.1 pg (ref 26.0–34.0)
MCHC: 33.3 g/dL (ref 30.0–36.0)
MCHC: 33.7 g/dL (ref 30.0–36.0)
MCHC: 32.8 g/dL (ref 30.0–36.0)
MCHC: 33.2 g/dL (ref 30.0–36.0)
MCHC: 33.3 g/dL (ref 30.0–36.0)
MCV: 91.7 fL (ref 78.0–100.0)
MCV: 91.8 fL (ref 78.0–100.0)
MCV: 94 fL (ref 78.0–100.0)
MCV: 93.7 fL (ref 78.0–100.0)
MCV: 93.3 fL (ref 78.0–100.0)
Platelets: 151 10*3/uL (ref 150–400)
Platelets: 140 10*3/uL — ABNORMAL LOW (ref 150–400)
Platelets: 145 10*3/uL — ABNORMAL LOW (ref 150–400)
Platelets: 144 10*3/uL — ABNORMAL LOW (ref 150–400)
Platelets: 184 10*3/uL (ref 150–400)
RBC: 4.35 MIL/uL (ref 3.87–5.11)
RBC: 4.01 MIL/uL (ref 3.87–5.11)
RBC: 3.99 MIL/uL (ref 3.87–5.11)
RBC: 3.15 MIL/uL — ABNORMAL LOW (ref 3.87–5.11)
RBC: 4.05 MIL/uL (ref 3.87–5.11)
RDW: 13.1 % (ref 11.5–15.5)
RDW: 12.9 % (ref 11.5–15.5)
RDW: 13 % (ref 11.5–15.5)
RDW: 13.1 % (ref 11.5–15.5)
RDW: 13 % (ref 11.5–15.5)
WBC: 8.7 10*3/uL (ref 4.0–10.5)
WBC: 6.8 10*3/uL (ref 4.0–10.5)
WBC: 20.8 10*3/uL — ABNORMAL HIGH (ref 4.0–10.5)
WBC: 15.8 10*3/uL — ABNORMAL HIGH (ref 4.0–10.5)
WBC: 17.5 10*3/uL — ABNORMAL HIGH (ref 4.0–10.5)

## 2016-03-17 LAB — I-STAT TROPONIN, ED: Troponin i, poc: 0.16 ng/mL (ref 0.00–0.08)

## 2016-03-17 LAB — PROTIME-INR
INR: 1.04
INR: 2.14
INR: 1.76
Prothrombin Time: 13.6 seconds (ref 11.4–15.2)
Prothrombin Time: 24.3 seconds — ABNORMAL HIGH (ref 11.4–15.2)
Prothrombin Time: 20.7 seconds — ABNORMAL HIGH (ref 11.4–15.2)

## 2016-03-17 LAB — CBC WITH DIFFERENTIAL/PLATELET
Basophils Absolute: 0 10*3/uL (ref 0.0–0.1)
Basophils Relative: 0 %
Eosinophils Absolute: 0.1 10*3/uL (ref 0.0–0.7)
Eosinophils Relative: 2 %
HCT: 40.7 % (ref 36.0–46.0)
Hemoglobin: 13.9 g/dL (ref 12.0–15.0)
Lymphocytes Relative: 34 %
Lymphs Abs: 2.2 10*3/uL (ref 0.7–4.0)
MCH: 31.5 pg (ref 26.0–34.0)
MCHC: 34.2 g/dL (ref 30.0–36.0)
MCV: 92.3 fL (ref 78.0–100.0)
Monocytes Absolute: 0.3 10*3/uL (ref 0.1–1.0)
Monocytes Relative: 5 %
Neutro Abs: 3.9 10*3/uL (ref 1.7–7.7)
Neutrophils Relative %: 59 %
Platelets: 34 10*3/uL — ABNORMAL LOW (ref 150–400)
RBC: 4.41 MIL/uL (ref 3.87–5.11)
RDW: 13 % (ref 11.5–15.5)
WBC: 6.6 10*3/uL (ref 4.0–10.5)

## 2016-03-17 LAB — GLUCOSE, CAPILLARY: Glucose-Capillary: 111 mg/dL — ABNORMAL HIGH (ref 65–99)

## 2016-03-17 LAB — DIC (DISSEMINATED INTRAVASCULAR COAGULATION)PANEL
D-Dimer, Quant: 3.46 ug/mL-FEU — ABNORMAL HIGH (ref 0.00–0.50)
Fibrinogen: 430 mg/dL (ref 210–475)
INR: 1.06
Platelets: 150 10*3/uL (ref 150–400)
Prothrombin Time: 13.8 seconds (ref 11.4–15.2)
Smear Review: NONE SEEN
aPTT: 26 seconds (ref 24–36)

## 2016-03-17 LAB — PHOSPHORUS: Phosphorus: 6 mg/dL — ABNORMAL HIGH (ref 2.5–4.6)

## 2016-03-17 LAB — TROPONIN I
Troponin I: 0.12 ng/mL (ref <0.03)
Troponin I: 0.16 ng/mL (ref <0.03)
Troponin I: 0.36 ng/mL (ref <0.03)

## 2016-03-17 LAB — TYPE AND SCREEN
ABO/RH(D): A POS
Antibody Screen: NEGATIVE

## 2016-03-17 LAB — APTT
aPTT: 26 seconds (ref 24–36)
aPTT: 44 seconds — ABNORMAL HIGH (ref 24–36)
aPTT: 38 seconds — ABNORMAL HIGH (ref 24–36)
aPTT: 74 seconds — ABNORMAL HIGH (ref 24–36)

## 2016-03-17 LAB — MRSA PCR SCREENING: MRSA by PCR: NEGATIVE

## 2016-03-17 LAB — MAGNESIUM: Magnesium: 1.8 mg/dL (ref 1.7–2.4)

## 2016-03-17 LAB — D-DIMER, QUANTITATIVE: D-Dimer, Quant: 3.56 ug/mL-FEU — ABNORMAL HIGH (ref 0.00–0.50)

## 2016-03-17 LAB — BRAIN NATRIURETIC PEPTIDE: B Natriuretic Peptide: 357.2 pg/mL — ABNORMAL HIGH (ref 0.0–100.0)

## 2016-03-17 LAB — ABO/RH: ABO/RH(D): A POS

## 2016-03-17 LAB — SAVE SMEAR

## 2016-03-17 MED ORDER — MIDAZOLAM HCL 2 MG/2ML IJ SOLN
INTRAMUSCULAR | Status: AC
  Filled 2016-03-17: qty 4

## 2016-03-17 MED ORDER — ORAL CARE MOUTH RINSE
15.0000 mL | Freq: Four times a day (QID) | OROMUCOSAL | Status: DC
  Administered 2016-03-17 – 2016-03-18 (×5): 15 mL via OROMUCOSAL

## 2016-03-17 MED ORDER — ACETAMINOPHEN 325 MG PO TABS
650.0000 mg | ORAL_TABLET | Freq: Four times a day (QID) | ORAL | Status: DC | PRN
  Administered 2016-03-19: 650 mg via ORAL
  Filled 2016-03-17: qty 2

## 2016-03-17 MED ORDER — IOPAMIDOL (ISOVUE-370) INJECTION 76%
INTRAVENOUS | Status: AC
  Administered 2016-03-17: 80 mL via INTRAVENOUS
  Filled 2016-03-17: qty 100

## 2016-03-17 MED ORDER — ZOLPIDEM TARTRATE 5 MG PO TABS
5.0000 mg | ORAL_TABLET | Freq: Every evening | ORAL | Status: DC | PRN
  Administered 2016-03-20: 5 mg via ORAL
  Filled 2016-03-17: qty 1

## 2016-03-17 MED ORDER — NOREPINEPHRINE BITARTRATE 1 MG/ML IV SOLN
0.0000 ug/min | INTRAVENOUS | Status: DC
  Administered 2016-03-18: 2 ug/min via INTRAVENOUS
  Filled 2016-03-17: qty 16

## 2016-03-17 MED ORDER — VITAMIN D 1000 UNITS PO TABS
1000.0000 [IU] | ORAL_TABLET | Freq: Every day | ORAL | Status: DC
  Administered 2016-03-17 – 2016-03-21 (×4): 1000 [IU] via ORAL
  Filled 2016-03-17 (×5): qty 1

## 2016-03-17 MED ORDER — FENTANYL CITRATE (PF) 100 MCG/2ML IJ SOLN
200.0000 ug | Freq: Once | INTRAMUSCULAR | Status: AC
  Administered 2016-03-17: 200 ug via INTRAVENOUS

## 2016-03-17 MED ORDER — SODIUM CHLORIDE 0.9 % IV SOLN
80.0000 mg | Freq: Once | INTRAVENOUS | Status: AC
  Administered 2016-03-17: 80 mg via INTRAVENOUS
  Filled 2016-03-17: qty 80

## 2016-03-17 MED ORDER — FENTANYL BOLUS VIA INFUSION
25.0000 ug | INTRAVENOUS | Status: DC | PRN
Start: 2016-03-17 — End: 2016-03-18
  Filled 2016-03-17: qty 25

## 2016-03-17 MED ORDER — HEPARIN BOLUS VIA INFUSION
4000.0000 [IU] | Freq: Once | INTRAVENOUS | Status: DC
  Filled 2016-03-17: qty 4000

## 2016-03-17 MED ORDER — CHLORHEXIDINE GLUCONATE 0.12% ORAL RINSE (MEDLINE KIT)
15.0000 mL | Freq: Two times a day (BID) | OROMUCOSAL | Status: DC
  Administered 2016-03-17 – 2016-03-19 (×4): 15 mL via OROMUCOSAL

## 2016-03-17 MED ORDER — SODIUM CHLORIDE 0.9% FLUSH: 3.0000 mL | INTRAVENOUS | Status: DC | PRN

## 2016-03-17 MED ORDER — LEVOTHYROXINE SODIUM 100 MCG PO TABS
100.0000 ug | ORAL_TABLET | Freq: Every day | ORAL | Status: DC
  Administered 2016-03-18 – 2016-03-21 (×4): 100 ug via ORAL
  Filled 2016-03-17 (×6): qty 1

## 2016-03-17 MED ORDER — ACETAMINOPHEN 500 MG PO TABS
1000.0000 mg | ORAL_TABLET | Freq: Once | ORAL | Status: AC
  Administered 2016-03-17: 1000 mg via ORAL
  Filled 2016-03-17: qty 2

## 2016-03-17 MED ORDER — NOREPINEPHRINE BITARTRATE 1 MG/ML IV SOLN
0.0000 ug/min | INTRAVENOUS | Status: DC
  Administered 2016-03-17: 28 ug/min via INTRAVENOUS
  Administered 2016-03-17: 15 ug/min via INTRAVENOUS
  Administered 2016-03-17: 30 ug/min via INTRAVENOUS
  Filled 2016-03-17: qty 4

## 2016-03-17 MED ORDER — SODIUM CHLORIDE 0.9 % IV SOLN
INTRAVENOUS | Status: DC
  Administered 2016-03-17: 06:00:00 via INTRAVENOUS

## 2016-03-17 MED ORDER — FENTANYL 2500MCG IN NS 250ML (10MCG/ML) PREMIX INFUSION
25.0000 ug/h | INTRAVENOUS | Status: DC
  Administered 2016-03-17: 50 ug/h via INTRAVENOUS
  Filled 2016-03-17: qty 250

## 2016-03-17 MED ORDER — TENECTEPLASE 50 MG IV KIT
45.0000 mg | PACK | Freq: Once | INTRAVENOUS | Status: AC
  Administered 2016-03-17: 45 mg via INTRAVENOUS
  Filled 2016-03-17: qty 10

## 2016-03-17 MED ORDER — SODIUM CHLORIDE 0.9 % IV SOLN: 250.0000 mL | INTRAVENOUS | Status: DC | PRN

## 2016-03-17 MED ORDER — CARVEDILOL 12.5 MG PO TABS: 6.2500 mg | ORAL_TABLET | Freq: Two times a day (BID) | ORAL | Status: DC

## 2016-03-17 MED ORDER — SODIUM CHLORIDE 0.9 % IV SOLN
Freq: Once | INTRAVENOUS | Status: AC
  Administered 2016-03-17: 16:00:00 via INTRAVENOUS

## 2016-03-17 MED ORDER — PHENYLEPHRINE HCL 10 MG/ML IJ SOLN: 0.0000 ug/min | INTRAVENOUS | Status: DC

## 2016-03-17 MED ORDER — AMLODIPINE BESYLATE 5 MG PO TABS
5.0000 mg | ORAL_TABLET | Freq: Every day | ORAL | Status: DC
  Administered 2016-03-17: 5 mg via ORAL
  Filled 2016-03-17: qty 1

## 2016-03-17 MED ORDER — ONDANSETRON HCL 4 MG PO TABS: 4.0000 mg | ORAL_TABLET | Freq: Four times a day (QID) | ORAL | Status: DC | PRN

## 2016-03-17 MED ORDER — SODIUM CHLORIDE 0.9 % IV SOLN
8.0000 mg/h | INTRAVENOUS | Status: DC
  Administered 2016-03-17 – 2016-03-19 (×4): 8 mg/h via INTRAVENOUS
  Filled 2016-03-17 (×11): qty 80

## 2016-03-17 MED ORDER — FENTANYL CITRATE (PF) 100 MCG/2ML IJ SOLN
50.0000 ug | INTRAMUSCULAR | Status: DC | PRN
  Administered 2016-03-17: 50 ug via INTRAVENOUS
  Filled 2016-03-17: qty 2

## 2016-03-17 MED ORDER — FENTANYL CITRATE (PF) 100 MCG/2ML IJ SOLN: 50.0000 ug | INTRAMUSCULAR | Status: DC | PRN

## 2016-03-17 MED ORDER — SODIUM CHLORIDE 0.9 % IV BOLUS (SEPSIS)
3000.0000 mL | Freq: Once | INTRAVENOUS | Status: AC
  Administered 2016-03-17: 3000 mL via INTRAVENOUS

## 2016-03-17 MED ORDER — CARVEDILOL 6.25 MG PO TABS
6.2500 mg | ORAL_TABLET | Freq: Two times a day (BID) | ORAL | Status: DC
  Administered 2016-03-17: 6.25 mg via ORAL
  Filled 2016-03-17 (×2): qty 1

## 2016-03-17 MED ORDER — OXYCODONE-ACETAMINOPHEN 5-325 MG PO TABS
1.0000 | ORAL_TABLET | ORAL | Status: DC | PRN
  Administered 2016-03-18: 1 via ORAL
  Filled 2016-03-17: qty 1

## 2016-03-17 MED ORDER — FENTANYL CITRATE (PF) 100 MCG/2ML IJ SOLN
INTRAMUSCULAR | Status: AC
  Filled 2016-03-17: qty 4

## 2016-03-17 MED ORDER — PHENYLEPHRINE HCL 10 MG/ML IJ SOLN
0.0000 ug/min | INTRAVENOUS | Status: DC
  Administered 2016-03-17: 300 ug/min via INTRAVENOUS
  Filled 2016-03-17: qty 1

## 2016-03-17 MED ORDER — ACETAMINOPHEN 650 MG RE SUPP: 650.0000 mg | Freq: Four times a day (QID) | RECTAL | Status: DC | PRN

## 2016-03-17 MED ORDER — LISINOPRIL 20 MG PO TABS: 40.0000 mg | ORAL_TABLET | Freq: Every day | ORAL | Status: DC

## 2016-03-17 MED ORDER — SODIUM CHLORIDE 0.9% FLUSH
3.0000 mL | Freq: Two times a day (BID) | INTRAVENOUS | Status: DC
  Administered 2016-03-18 – 2016-03-20 (×4): 3 mL via INTRAVENOUS

## 2016-03-17 MED ORDER — INSULIN ASPART 100 UNIT/ML ~~LOC~~ SOLN
2.0000 [IU] | SUBCUTANEOUS | Status: DC
  Administered 2016-03-18 – 2016-03-19 (×3): 2 [IU] via SUBCUTANEOUS

## 2016-03-17 MED ORDER — SODIUM CHLORIDE 0.9 % IV SOLN
INTRAVENOUS | Status: DC
  Administered 2016-03-17 – 2016-03-18 (×2): via INTRAVENOUS

## 2016-03-17 MED ORDER — MIDAZOLAM HCL 2 MG/2ML IJ SOLN: 1.0000 mg | INTRAMUSCULAR | Status: DC | PRN

## 2016-03-17 MED ORDER — FENTANYL CITRATE (PF) 100 MCG/2ML IJ SOLN: 50.0000 ug | Freq: Once | INTRAMUSCULAR | Status: AC

## 2016-03-17 MED ORDER — MIDAZOLAM HCL 2 MG/2ML IJ SOLN
1.0000 mg | INTRAMUSCULAR | Status: DC | PRN
  Administered 2016-03-18: 1 mg via INTRAVENOUS
  Filled 2016-03-17: qty 2

## 2016-03-17 MED ORDER — SODIUM CHLORIDE 0.9% FLUSH: 3.0000 mL | Freq: Two times a day (BID) | INTRAVENOUS | Status: DC

## 2016-03-17 MED ORDER — ETOMIDATE 2 MG/ML IV SOLN
20.0000 mg | Freq: Once | INTRAVENOUS | Status: AC
  Administered 2016-03-17: 20 mg via INTRAVENOUS

## 2016-03-17 MED ORDER — ATORVASTATIN CALCIUM 80 MG PO TABS
80.0000 mg | ORAL_TABLET | Freq: Every day | ORAL | Status: DC
  Administered 2016-03-17 – 2016-03-20 (×4): 80 mg via ORAL
  Filled 2016-03-17 (×4): qty 1

## 2016-03-17 MED ORDER — SODIUM CHLORIDE 0.9 % IV BOLUS (SEPSIS)
500.0000 mL | Freq: Once | INTRAVENOUS | Status: AC
  Administered 2016-03-17: 500 mL via INTRAVENOUS

## 2016-03-17 MED ORDER — MIDAZOLAM HCL 2 MG/2ML IJ SOLN
4.0000 mg | Freq: Once | INTRAMUSCULAR | Status: AC
  Administered 2016-03-17: 4 mg via INTRAVENOUS

## 2016-03-17 MED ORDER — HEPARIN (PORCINE) IN NACL 100-0.45 UNIT/ML-% IJ SOLN
1200.0000 [IU]/h | INTRAMUSCULAR | Status: DC
  Filled 2016-03-17 (×2): qty 250

## 2016-03-17 MED ORDER — ONDANSETRON HCL 4 MG/2ML IJ SOLN
4.0000 mg | Freq: Four times a day (QID) | INTRAMUSCULAR | Status: DC | PRN
  Administered 2016-03-17 – 2016-03-18 (×2): 4 mg via INTRAVENOUS
  Filled 2016-03-17 (×2): qty 2

## 2016-03-17 MED ORDER — ARGATROBAN 50 MG/50ML IV SOLN
0.6000 ug/kg/min | INTRAVENOUS | Status: DC
  Administered 2016-03-17: 1 ug/kg/min via INTRAVENOUS
  Filled 2016-03-17 (×3): qty 50

## 2016-03-17 NOTE — Progress Notes (Addendum)
ANTICOAGULATION CONSULT NOTE - Initial Consult  Pharmacy Consult for Heparin Indication: PE  No Known Allergies  Patient Measurements: Height: 5\' 2"  (157.5 cm) Weight: 188 lb (85.3 kg) IBW/kg (Calculated) : 50.1 Heparin Dosing Weight: 70 kg  Vital Signs: Temp: 97.7 F (36.5 C) (11/29 0218) Temp Source: Oral (11/29 0218) BP: 130/81 (11/29 0300) Pulse Rate: 95 (11/29 0303)  Labs:  Recent Labs  03/14/16 1420 03/14/16 1537 03/14/16 1800 03/14/16 2329 03/15/16 0524 03/17/16 0240  HGB 15.0  --   --   --  14.1 13.9  HCT 43.6  --   --   --  41.6 40.7  PLT 229  --   --   --  177 34*  LABPROT  --  12.0  --   --  13.5  --   INR  --  0.89  --   --  1.02  --   HEPARINUNFRC  --   --   --  0.33 0.39  --   CREATININE 0.93  --   --   --   --  1.00  TROPONINI  --   --  0.57* 0.35* 0.15*  --     Estimated Creatinine Clearance: 49.3 mL/min (by C-G formula based on SCr of 1 mg/dL).   Medical History: Past Medical History:  Diagnosis Date  . Hypertension   . Thyroid disease     Medications:  No current facility-administered medications for this encounter.   Current Outpatient Prescriptions:  .  amLODipine (NORVASC) 5 MG tablet, Take 1 tablet (5 mg total) by mouth daily., Disp: 30 tablet, Rfl: 6 .  aspirin 81 MG tablet, Take 81 mg by mouth daily., Disp: , Rfl:  .  atorvastatin (LIPITOR) 80 MG tablet, Take 1 tablet (80 mg total) by mouth daily at 6 PM., Disp: 30 tablet, Rfl: 6 .  carvedilol (COREG) 6.25 MG tablet, Take 1 tablet (6.25 mg total) by mouth 2 (two) times daily with a meal., Disp: 60 tablet, Rfl: 6 .  Cholecalciferol (VITAMIN D PO), Take by mouth., Disp: , Rfl:  .  estradiol (VIVELLE-DOT) 0.05 MG/24HR patch, PLACE 1 PATCH ONTO THE SKIN TWICE A WEEK (Patient taking differently: Place 1 patch onto the skin 2 (two) times a week. PLACE 1 PATCH ONTO THE SKIN TWICE A WEEK), Disp: 24 patch, Rfl: 4 .  hydrochlorothiazide (MICROZIDE) 12.5 MG capsule, Take 12.5 mg by mouth  daily., Disp: , Rfl:  .  levothyroxine (SYNTHROID, LEVOTHROID) 100 MCG tablet, Take 100 mcg by mouth daily before breakfast., Disp: , Rfl:  .  lisinopril (PRINIVIL,ZESTRIL) 40 MG tablet, Take 1 tablet (40 mg total) by mouth daily., Disp: 30 tablet, Rfl: 6   Assessment: 75 y.o. female with PE for heparin  Goal of Therapy:  Heparin level 0.3-0.7 units/ml Monitor platelets by anticoagulation protocol: Yes   Plan:  Heparin 4000 units IV bolus, then start heparin 1200 units/hr Check heparin level in 6 hours.   Caryl Pina 03/17/2016,4:24 AM   Addendum: After further review, patient received heparin during prior hospitalization and, given thrombocytopenia, is at increase risk of HIT.  Discussed with Dr. Blaine Hamper,  Will check HIT panel, start Argatroban for PE.    Phillis Knack, PharmD, BCPS  03/17/2016 5:06 AM

## 2016-03-17 NOTE — Procedures (Signed)
Intubation Procedure Note LOYD LAMOTTE [id number] 1940/12/09  Procedure: Intubation Indications: Respiratory insufficiency  Procedure Details Consent: Unable to obtain consent because of emergent medical necessity. Time Out: Verified patient identification, verified procedure, site/side was marked, verified correct patient position, special equipment/implants available, medications/allergies/relevent history reviewed, required imaging and test results available.  Performed  Maximum sterile technique was used including gloves, hand hygiene and mask.  MAC    Evaluation Hemodynamic Status: BP stable throughout; O2 sats: stable throughout Patient's Current Condition: stable Complications: No apparent complications Patient did tolerate procedure well. Chest X-ray ordered to verify placement.  CXR: pending.   Jennet Maduro 03/17/2016

## 2016-03-17 NOTE — Progress Notes (Signed)
PULMONARY / CRITICAL CARE MEDICINE   Name: Michele Thompson MRN: [id number] DOB: Oct 19, 1940    ADMISSION DATE:  03/17/2016 CONSULTATION DATE:  03/17/2016  REFERRING MD:  Pryor Curia, D.O. / EDP  CHIEF COMPLAINT:  Pulmonary Embolism  HISTORY OF PRESENT ILLNESS:  75 y.o. female recently admitted for unstable angina with known past medical history significant for hypertension and hypothyroidism. During previous admission patient endorsed exertional dyspnea as well as the sensation of her throat tightening with exertion as well. Symptoms began to occur at rest which prompted her presentation. Troponin I was slightly elevated on admission previously as well and patient underwent left heart catheterization with only nonobstructive coronary artery disease identified. Pulmonary embolism was considered but without risk factors imaging was not performed. Pain was thought to be secondary to vasospasm. Per documentation patient was able to ambulate without significant dyspnea on the day of discharge. Documentation from the patient's emergency department physician this evening records that the patient has been complaining of worsening and intermittent episodes of shortness of breath for approximately 6 weeks. She has had associated nausea and diaphoresis with these episodes. Patient denied any associated chest pain, fever, cough, leg swelling, abdominal pain, vomiting, or diarrhea. Patient denied any history of pulmonary embolism or DVT. Patient was normotensive at presentation with a normal heart rate and saturating 96% on room air per documentation. PCCM was consulted given the concern for right heart strain and possible need for thrombolytics.   Patient reports ongoing dyspnea for approximately one year that has been more severe over the last 2 months and certainly over the last week. Last evening the patient noticed acute worsening in her shortness of breath with the same sensation as though she could not take a  deep inspiratory breath. Also note the patient complained of a headache which seems to be improving slightly. She did endorse nausea as well as diaphoresis and near-syncope with this episode. Denies any frank chest pain, pressure, or tightness. Does report some mild central abdominal tightness and pain. Patient does endorse some intermittent blood whenever she wipes after having a bowel movement which she attributes to a painful area near her rectum. No prior history of GI bleeding. Patient was on heparin infusion during last admission started on 11/26.   SUBJECTIVE: NAD  VITAL SIGNS: BP (!) 142/58   Pulse 88   Temp 98.3 F (36.8 C) (Oral)   Resp 12   Ht 5\' 2"  (1.575 m)   Wt 188 lb (85.3 kg)   SpO2 97%   BMI 34.39 kg/m   HEMODYNAMICS:    VENTILATOR SETTINGS:    INTAKE / OUTPUT: No intake/output data recorded.  PHYSICAL EXAMINATION: General:  Awake. Alert. No acute distress.  Integument:  Warm & dry. No rash on exposed skin. No bruising on exposed skin. Lymphatics:  No appreciated cervical or supraclavicular lymphadenoapthy. HEENT:  Moist mucus membranes. No oral ulcers. No scleral injection or icterus.  Cardiovascular:  Regular rate and rhythm. No edema. No appreciable JVD.  Pulmonary:  Clear to auscultation bilaterally. Speaking in complete sentences. Normal work of breathing on nasal cannula oxygen. Abdomen: Soft. Normal bowel sounds. Nondistended. Grossly nontender. Musculoskeletal:  Normal bulk and tone. Hand grip strength 5/5 bilaterally. No joint deformity or effusion appreciated. Neurological: t. No meningismus. Moving all 4 extremities equally.  Psychiatric:  Mood and affect congruent. Speech normal rhythm, rate & tone.   LABS:  BMET  Recent Labs Lab 03/14/16 1420 03/17/16 0240  NA 137 141  K 3.6 3.5  CL 107 113*  CO2 20* 15*  BUN 14 12  CREATININE 0.93 1.00  GLUCOSE 111* 117*   Electrolytes  Recent Labs Lab 03/14/16 1420 03/17/16 0240  CALCIUM 8.8*  9.0   CBC  Recent Labs Lab 03/15/16 0524 03/17/16 0240 03/17/16 0534 03/17/16 0546  WBC 6.3 6.6 8.7  --   HGB 14.1 13.9 13.3  --   HCT 41.6 40.7 39.9  --   PLT 177 34* 151 150    Coag's  Recent Labs Lab 03/15/16 0524 03/17/16 0534 03/17/16 0546  APTT  --  26 26  INR 1.02 1.04 1.06    Sepsis Markers No results for input(s): LATICACIDVEN, PROCALCITON, O2SATVEN in the last 168 hours.  ABG No results for input(s): PHART, PCO2ART, PO2ART in the last 168 hours.  Liver Enzymes No results for input(s): AST, ALT, ALKPHOS, BILITOT, ALBUMIN in the last 168 hours.  Cardiac Enzymes  Recent Labs Lab 03/14/16 2329 03/15/16 0524 03/17/16 0534  TROPONINI 0.35* 0.15* 0.12*    Glucose  Recent Labs Lab 03/17/16 0840  GLUCAP 111*    Imaging Ct Head Wo Contrast  Result Date: 03/17/2016 CLINICAL DATA:  Headache.  Hypertension. EXAM: CT HEAD WITHOUT CONTRAST TECHNIQUE: Contiguous axial images were obtained from the base of the skull through the vertex without intravenous contrast. COMPARISON:  None. FINDINGS: Brain: No evidence of acute infarction, hemorrhage, hydrocephalus, extra-axial collection or mass lesion/mass effect. Vascular: No hyperdense vessel or unexpected calcification. Skull: Normal. Negative for fracture or focal lesion. Sinuses/Orbits: No acute finding. Other: None. IMPRESSION: Negative noncontrast head CT. Electronically Signed   By: Earle Gell M.D.   On: 03/17/2016 07:17   Ct Angio Chest Pe W And/or Wo Contrast  Result Date: 03/17/2016 CLINICAL DATA:  Chronic shortness of breath. Elevated D-dimer. Patient on Estrogen patches. Initial encounter. EXAM: CT ANGIOGRAPHY CHEST WITH CONTRAST TECHNIQUE: Multidetector CT imaging of the chest was performed using the standard protocol during bolus administration of intravenous contrast. Multiplanar CT image reconstructions and MIPs were obtained to evaluate the vascular anatomy. CONTRAST:  80 mL of Isovue 370 IV  contrast COMPARISON:  Chest radiograph performed 03/14/2016 FINDINGS: Cardiovascular: Bilateral pulmonary embolus is noted, extending from the main pulmonary arteries to all lobes of both lungs. The RV/LV ratio of 2.2 corresponds to significant right heart strain and at least submassive pulmonary embolus. Scattered calcification is noted along the thoracic aorta. The proximal great vessels are grossly unremarkable. Mediastinum/Nodes: The mediastinum is grossly unremarkable in appearance. No mediastinal lymphadenopathy is seen. No pericardial effusion is identified. The thyroid gland is diminutive and not well characterized. No axillary lymphadenopathy is seen. Lungs/Pleura: Mild patchy bilateral atelectasis is noted. No pleural effusion or pneumothorax is seen. No masses are identified. Upper Abdomen: The visualized portions of the liver and spleen are unremarkable. The visualized portions of the pancreas is within normal limits. Musculoskeletal: No acute osseous abnormalities are identified. Osseous fusion is noted at T7-T8. The visualized musculature is unremarkable in appearance. Review of the MIP images confirms the above findings. IMPRESSION: 1. Bilateral pulmonary embolus, extending from the main pulmonary arteries to all lobes of both lungs. Positive for acute PE with CT evidence of right heart strain (RV/LV Ratio = 2.2) consistent with at least submassive (intermediate risk) PE. The presence of right heart strain has been associated with an increased risk of morbidity and mortality. Please activate Code PE by paging [phone]. 2. Mild patchy bilateral atelectasis noted; lungs otherwise clear. 3. Osseous fusion at T7-T8. Critical Value/emergent results  were called by telephone at the time of interpretation on 03/17/2016 at 4:21 am to Dr. Pryor Curia, who verbally acknowledged these results. Electronically Signed   By: Garald Balding M.D.   On: 03/17/2016 04:29   STUDIES:  LHC 03/16/16:  Nonobstructive  CAD w/ mid-LAD irregularity. Normal LV function. CTA 11/29: Bilateral pulmonary embolism extending from Main pulmonary artery to all lobes of both lungs. Positive for acute pulmonary embolism with RV/LV ratio 2.2.  MICROBIOLOGY: MRSA PCR 11/26:  Negative  ANTIBIOTICS: None  SIGNIFICANT EVENTS: 11/26 - 11/28 - Admit w/ Unstable angina w/ LHC on 11/28  11/29 - Admit with bilateral massive PE's 11/29 S/S HIT placed on argatroban , HIT panel pending 11/29 CT head neg  LINES/TUBES: PIV  ASSESSMENT / PLAN:  75 y.o. female with progressively worsening dyspnea over the last year. Patient was recently admitted with a left heart catheterization and treated with heparin for systemic anticoagulation while awaiting left heart catheterization. She returns this evening with acute worsening in her dyspnea and symptoms certainly concerning for possible right heart strain. However, patient has a significant thrombocytopenia raising the concern for HIT. Her ongoing headache will need to be evaluated for possible intracranial hemorrhage with imaging. I will continue with the plan for systemic anticoagulation utilizing an argatroban infusion. Plan to monitor the patient closely for signs of further bleeding seen or unseen in the intensive care unit given the severity of her thrombocytopenia. Given the severity of her thrombocytopenia I feel the risk of hemorrhage is too great from systemic or catheter directed thrombolytic therapy. I did discuss this with the patient and her son at bedside. I did discuss the risk of seen and I'm seeing bleeding with the patient and her son at bedside explaining that she should notify her nurse and staff immediately if she experiences any worsening or change in her headache, worsening breathing, near syncope, nausea, abdominal pain, or bleeding that she visualizes with bowel movements or urinating.  1. Pulmonary Embolism: Suspect secondary to heparin-induced thrombocytopenia with  thrombosis. Initiating systemic anticoagulation with argatroban per pharmacy protocol. Checking venous duplex of bilateral lower extremities as well as right upper extremity given recent left heart catheterization. Also checking complete echocardiogram. 2. Thrombocytopenia:  Obtaining peripheral smear for pathologist review. Sending hit antibody. Trending platelet count with CBC. 3. Headache:  Neg CT Head w/o contrast . Neuro checks every 4 hours. 4. Non-obstructive Coronary Artery Disease: Continuing to trend troponin I. Continuing patient on statin and beta blocker. 5. Essential Hypertension: Continuing Coreg & Norvasc at home doses. Holding Lisinopril. Vitals per unit protocol. 6. Hyperlipidemia: Continuing Lipitor 80 mg by mouth daily. 7. Hypothyroidism: Continuing home Synthroid 100 g by mouth daily.  8. Possible GI Bleeding: Continuing Protonix drip after bolus ordered by hospitalist service. Continuing to trend hemoglobin with CBC every 6 hours.  9. Prophylaxis: Systemic anticoagulation with R Bactroban & Protonix drip.  10. Diet: Nothing by mouth except for meds.  May advance 11/29 11. Code Status:  Patient is full code but would not wish for a prolonged ventilation. Son is healthcare power of attorney and was present during conversation.  12. Disposition: Admitting patient to intensive care unit.  30 min cct  Richardson Landry Minor ACNP Maryanna Shape PCCM Pager [phone] till 3 pm If no answer page [phone] 03/17/2016, 9:54 AM  Attending Note:  75 year old female with HITT after heparin use for NSTEMI that later developed a PE.  PCCM was consulted for a PE.  On exam, patient  is comfortable, lungs are clear.  I reviewed chest CT myself, minimal PE noted.  Discussed with PCCM-NP.  PE:  - Argatroban  - Lower ext dopplers, if large or mobile may need IVC filter  HITT:  - Argatroban  - CBC in AM  Hypoxemia:  - Titrate O2 for sat of 88-92%  Hypothyroidism:  - Synthroid 100 microgram  PCCM  will sign off, please call back if needed.  Patient seen and examined, agree with above note.  I dictated the care and orders written for this patient under my direction.  Rush Farmer, MD [phone]

## 2016-03-17 NOTE — Progress Notes (Signed)
Called back by RN, patient is in severe respiratory distress.  Patient appeared pale and diaphoretic.  Hypoxemic respiratory failure with hemodynamic abnormalities.  Patient became bradycardic while in the room and developed PEA.  ACLS protocol (see sheet) was followed and patient was intubated.  Subsequent multiple episodes of ROSC.  Bedside echo without tamponade, point of care U/S with evidence of sliding lung noted, JVD was high.  Electrolytes normal.  Based on that, and history of PE decision was made to give TNKase.  After TNKase, more stable ROSC was established and patient began improving from a hemodynamic standpoint.  Spoke with sons at length after, patient has been clear with them that quality of life is what is important for her.  But given acuity of this.  She remains full code but if it is evident that there is brain damage then would withdraw.    Doppler of the arms = bilateral ankle to hip DVT bilaterally with a mobile clot in the right axilla.  Spoke with Dr. Oneida Alar, he will see patient.  The patient is critically ill with multiple organ systems failure and requires high complexity decision making for assessment and support, frequent evaluation and titration of therapies, application of advanced monitoring technologies and extensive interpretation of multiple databases.   Critical Care Time devoted to patient care services described in this note is  90  Minutes. This time reflects time of care of this signee Dr Jennet Maduro. This critical care time does not reflect procedure time, or teaching time or supervisory time of PA/NP/Med student/Med Resident etc but could involve care discussion time.  Rush Farmer, M.D. [facility]/Critical Care Medicine. Pager: [phone]. After hours pager: [phone].

## 2016-03-17 NOTE — Consult Note (Addendum)
Referring Physician: Dr Nelda Marseille critical care  Patient name: Michele Thompson MRN: [id number] DOB: [birth year] Sex: female  REASON FOR CONSULT: right upper and lower extremity DVT  HPI: Michele Thompson is a 75 y.o. female, with suspected HIT presented today to ER with pulmonary embolus with right heart strain (by CT) eventually requiring intubation after PEA arrest.  Pt currently sedated on vent on levophed systemic BP in 130s.  This is currently being weaned.  She has intermittently been on argatroban but this has been intermittent due to bleeding from needlestick right arm from recent cath. She was not given systemic or catheter directed thrombolysis due to concerns of her thrombocytopenia.  Other medical problems include hypertension and thyroid dysfunction.   She had a venous duplex today which noted DVT right arm and right leg with possible mobile thrombus in the axillary vein.  I was asked to see pt regarding the mobile thrombus.  Past Medical History:  Diagnosis Date  . Coronary artery disease 03/16/2016   non-obstructive noted on left heart cath  . Hypertension   . Thyroid disease    Past Surgical History:  Procedure Laterality Date  . CARDIAC CATHETERIZATION N/A 03/15/2016   Procedure: Left Heart Cath and Coronary Angiography;  Surgeon: [name], MD;  Location: [facility] [place] [facility];  Service: Cardiovascular;  Laterality: N/A;  . VAGINAL HYSTERECTOMY  1983   endometriosis    Family History  Problem Relation Age of Onset  . Heart failure Mother   . Heart failure Father   . Heart failure Maternal Grandmother   . Myasthenia gravis Brother   . Stroke Brother   . Clotting disorder Neg Hx     SOCIAL HISTORY: Social History   Social History  . Marital status: Widowed    Spouse name: N/A  . Number of children: N/A  . Years of education: N/A   Occupational History  . Not on file.   Social History Main Topics  . Smoking status: Never Smoker  . Smokeless tobacco: Never  Used  . Alcohol use No  . Drug use: No  . Sexual activity: No     Comment: 1st intercourse 75 yo-Fewer than 5 partners   Other Topics Concern  . Not on file   Social History Narrative   [facility]:   Originally from [place]. Moved to [place] in 2012. 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    Allergies  Allergen Reactions  . Heparin     HIT panel pending    Current Facility-Administered Medications  Medication Dose Route Frequency Provider Last Rate Last Dose  . 0.9 %  sodium chloride infusion  250 mL Intravenous PRN Ivor Costa, MD      . 0.9 %  sodium chloride infusion   Intravenous Continuous Ivor Costa, MD 75 mL/hr at 03/17/16 1700    . 0.9 %  sodium chloride infusion   Intravenous Continuous Rebecka Apley, RPH 50 mL/hr at 03/17/16 1800    . acetaminophen (TYLENOL) tablet 650 mg  650 mg Oral Q6H PRN Ivor Costa, MD       Or  . acetaminophen (TYLENOL) suppository 650 mg  650 mg Rectal Q6H PRN Ivor Costa, MD      . argatroban 1 mg/mL infusion  1.2 mcg/kg/min Intravenous Continuous Honor Loh, RPH   Stopped at 03/17/16 1300  . atorvastatin (LIPITOR) tablet 80 mg  80 mg Oral q1800 Ivor Costa, MD  80 mg at 03/17/16 1743  . chlorhexidine gluconate (MEDLINE KIT) (PERIDEX) 0.12 % solution 15 mL  15 mL Mouth Rinse BID Omar Person, NP      . cholecalciferol (VITAMIN D) tablet 1,000 Units  1,000 Units Oral Daily Ivor Costa, MD   1,000 Units at 03/17/16 1002  . fentaNYL (SUBLIMAZE) bolus via infusion 25 mcg  25 mcg Intravenous Q1H PRN Rush Farmer, MD      . fentaNYL (SUBLIMAZE) injection 50 mcg  50 mcg Intravenous Q15 min PRN Omar Person, NP   50 mcg at 03/17/16 1611  . fentaNYL (SUBLIMAZE) injection 50 mcg  50 mcg Intravenous Q2H PRN Omar Person, NP      . fentaNYL 2523mg in NS 2562m(1079mml) infusion-PREMIX  25-400 mcg/hr Intravenous Continuous WesRush FarmerD 15 mL/hr at 03/17/16 1748 150 mcg/hr  at 03/17/16 1748  . levothyroxine (SYNTHROID, LEVOTHROID) tablet 100 mcg  100 mcg Oral QAC breakfast XilIvor CostaD      . MEDLINE mouth rinse  15 mL Mouth Rinse QID KatOmar PersonP   15 mL at 03/17/16 1758  . midazolam (VERSED) injection 1 mg  1 mg Intravenous Q15 min PRN WesRush FarmerD      . midazolam (VERSED) injection 1 mg  1 mg Intravenous Q2H PRN WesRush FarmerD      . norepinephrine (LEVOPHED) 16 mg in dextrose 5 % 250 mL (0.064 mg/mL) infusion  0-40 mcg/min Intravenous Titrated XilIvor CostaD      . ondansetron ([facility] Hospital[facility]t 4 mg  4 mg Oral Q6H PRN XilIvor CostaD       Or  . ondansetron (ZOFRAN) injection 4 mg  4 mg Intravenous Q6H PRN XilIvor CostaD      . oxyCODONE-acetaminophen (PERCOCET/ROXICET) 5-325 MG per tablet 1 tablet  1 tablet Oral Q4H PRN XilIvor CostaD      . pantoprazole (PROTONIX) 80 mg in sodium chloride 0.9 % 250 mL (0.32 mg/mL) infusion  8 mg/hr Intravenous Continuous XilIvor CostaD 25 mL/hr at 03/17/16 1700 8 mg/hr at 03/17/16 1700  . phenylephrine (NEO-SYNEPHRINE) 10 mg in dextrose 5 % 250 mL (0.04 mg/mL) infusion  0-400 mcg/min Intravenous Titrated WesRush FarmerD   Stopped at 03/17/16 1323  . sodium chloride flush (NS) 0.9 % injection 3 mL  3 mL Intravenous Q12H XilIvor CostaD      . sodium chloride flush (NS) 0.9 % injection 3 mL  3 mL Intravenous PRN XilIvor CostaD      . sodium chloride flush (NS) 0.9 % injection 3 mL  3 mL Intravenous Q12H XilIvor CostaD      . zolpidem (AMBIEN) tablet 5 mg  5 mg Oral QHS PRN XilIvor CostaD        ROS:   Unable to obtain pt sedated on vent  Physical Examination  Vitals:   03/17/16 1700 03/17/16 1800 03/17/16 1900 03/17/16 1936  BP:  (!) 149/103 (!) 141/78   Pulse: 73 (!) 59 (!) 56 (!) 59  Resp: 17 (!) 35 (!) 31 (!) 35  Temp:      TempSrc:      SpO2: 100% 100% 99% 100%  Weight:      Height:        Body mass index is 34.39 kg/m.  General:  sedated Pulmonary: coarse BS on vent Cardiac: Regular Rate and  Rhythm  Abdomen: Soft, non-tender, non-distended, obese Skin: No  rash Extremities: mild edema of all four extremites non focal Musculoskeletal: No deformity Neurologic: does not follow commands  DATA:  CBC    Component Value Date/Time   WBC 15.8 (H) 03/17/2016 1612   RBC 3.15 (L) 03/17/2016 1612   HGB 9.8 (L) 03/17/2016 1612   HCT 29.5 (L) 03/17/2016 1612   PLT 144 (L) 03/17/2016 1612   MCV 93.7 03/17/2016 1612   MCH 31.1 03/17/2016 1612   MCHC 33.2 03/17/2016 1612   RDW 13.1 03/17/2016 1612   LYMPHSABS 2.2 03/17/2016 0240   MONOABS 0.3 03/17/2016 0240   EOSABS 0.1 03/17/2016 0240   BASOSABS 0.0 03/17/2016 0240    BMET    Component Value Date/Time   NA 140 03/17/2016 1433   K 4.3 03/17/2016 1433   CL 112 (H) 03/17/2016 1433   CO2 15 (L) 03/17/2016 1433   GLUCOSE 288 (H) 03/17/2016 1433   BUN 9 03/17/2016 1433   CREATININE 1.28 (H) 03/17/2016 1433   CALCIUM 6.6 (L) 03/17/2016 1433   GFRNONAA 40 (L) 03/17/2016 1433   GFRAA 46 (L) 03/17/2016 1433   CT chest images reviewed.  Bilateral PE  Head CT images reviewed.  No bleed  ASSESSMENT:  PE with right arm DVT, right leg DVT with possible mobile thrombus right axillary vein   PLAN:  1. Would continue argatroban for now.  No role for vascular surgical intervention especially given pt current critical state.  Specifically no role for venous thrombectomy or SVC filter as risk of procedure far outweighs any perceived benefit.  2. If HIT is negative would transition back to heparin.  3.  If continued bleeding from radial artery cath site may need to consider repair so that she can be anticoagulated properly  4.  Would still keep thrombolytics in the equation if not bleeding systemically and platelet count improves   Ruta Hinds, MD [facility]: [phone] Pager: [phone]

## 2016-03-17 NOTE — Progress Notes (Addendum)
eLink Physician-Brief Progress Note Patient Name: Michele Thompson DOB: [birth year] MRN: [id number]   Date of Service  03/17/2016  HPI/Events of Note  Significant drop in Hb, s/p TNKase. Will check stat H/H now.  RN notes oozing blood from mult IV sites, likely due to previous TNKase.  Elevated INR likely from previous argatroban.   eICU Interventions  Transfuse as needed, may need to consult GI. Argatroban when appropriate.  Continue protonix.  Check h/h; INR.      Intervention Category Major Interventions: Hemorrhage - evaluation and management  Laverle Hobby 03/17/2016, 6:55 PM

## 2016-03-17 NOTE — Consult Note (Addendum)
PULMONARY / CRITICAL CARE MEDICINE   Name: Michele Thompson MRN: [id number] DOB: 12-29-1940    ADMISSION DATE:  03/17/2016 CONSULTATION DATE:  03/17/2016  REFERRING MD:  Pryor Curia, D.O. / EDP  CHIEF COMPLAINT:  Pulmonary Embolism  HISTORY OF PRESENT ILLNESS:  75 y.o. female recently admitted for unstable angina with known past medical history significant for hypertension and hypothyroidism. During previous admission patient endorsed exertional dyspnea as well as the sensation of her throat tightening with exertion as well. Symptoms began to occur at rest which prompted her presentation. Troponin I was slightly elevated on admission previously as well and patient underwent left heart catheterization with only nonobstructive coronary artery disease identified. Pulmonary embolism was considered but without risk factors imaging was not performed. Pain was thought to be secondary to vasospasm. Per documentation patient was able to ambulate without significant dyspnea on the day of discharge. Documentation from the patient's emergency department physician this evening records that the patient has been complaining of worsening and intermittent episodes of shortness of breath for approximately 6 weeks. She has had associated nausea and diaphoresis with these episodes. Patient denied any associated chest pain, fever, cough, leg swelling, abdominal pain, vomiting, or diarrhea. Patient denied any history of pulmonary embolism or DVT. Patient was normotensive at presentation with a normal heart rate and saturating 96% on room air per documentation. PCCM was consulted given the concern for right heart strain and possible need for thrombolytics.   Patient reports ongoing dyspnea for approximately one year that has been more severe over the last 2 months and certainly over the last week. Last evening the patient noticed acute worsening in her shortness of breath with the same sensation as though she could not take a  deep inspiratory breath. Also note the patient complained of a headache which seems to be improving slightly. She did endorse nausea as well as diaphoresis and near-syncope with this episode. Denies any frank chest pain, pressure, or tightness. Does report some mild central abdominal tightness and pain. Patient does endorse some intermittent blood whenever she wipes after having a bowel movement which she attributes to a painful area near her rectum. No prior history of GI bleeding. Patient was on heparin infusion during last admission started on 11/26.  PAST MEDICAL HISTORY :  Past Medical History:  Diagnosis Date  . Coronary artery disease 03/16/2016   non-obstructive noted on left heart cath  . Hypertension   . Thyroid disease     PAST SURGICAL HISTORY: Past Surgical History:  Procedure Laterality Date  . CARDIAC CATHETERIZATION N/A 03/15/2016   Procedure: Left Heart Cath and Coronary Angiography;  Surgeon: [name], MD;  Location: [facility];  Service: Cardiovascular;  Laterality: N/A;  . VAGINAL HYSTERECTOMY  1983   endometriosis    Allergies  Allergen Reactions  . Heparin     HIT panel pending    No current facility-administered medications on file prior to encounter.    Current Outpatient Prescriptions on File Prior to Encounter  Medication Sig  . amLODipine (NORVASC) 5 MG tablet Take 1 tablet (5 mg total) by mouth daily.  Marland Kitchen aspirin 81 MG tablet Take 81 mg by mouth daily.  Marland Kitchen atorvastatin (LIPITOR) 80 MG tablet Take 1 tablet (80 mg total) by mouth daily at 6 PM.  . carvedilol (COREG) 6.25 MG tablet Take 1 tablet (6.25 mg total) by mouth 2 (two) times daily with a meal.  . Cholecalciferol (VITAMIN D PO) Take by mouth.  . estradiol (  VIVELLE-DOT) 0.05 MG/24HR patch PLACE 1 PATCH ONTO THE SKIN TWICE A WEEK (Patient taking differently: Place 1 patch onto the skin 2 (two) times a week. PLACE 1 PATCH ONTO THE SKIN TWICE A WEEK)  . hydrochlorothiazide (MICROZIDE) 12.5  MG capsule Take 12.5 mg by mouth daily.  Marland Kitchen levothyroxine (SYNTHROID, LEVOTHROID) 100 MCG tablet Take 100 mcg by mouth daily before breakfast.  . lisinopril (PRINIVIL,ZESTRIL) 40 MG tablet Take 1 tablet (40 mg total) by mouth daily.    FAMILY HISTORY:  Family History  Problem Relation Age of Onset  . Heart failure Mother   . Heart failure Father   . Heart failure Maternal Grandmother   . Myasthenia gravis Brother   . Stroke Brother   . Clotting disorder Neg Hx      SOCIAL HISTORY: Social History   Social History  . Marital status: Widowed    Spouse name: N/A  . Number of children: N/A  . Years of education: N/A   Social History Main Topics  . Smoking status: Never Smoker  . Smokeless tobacco: Never Used  . Alcohol use No  . Drug use: No  . Sexual activity: No     Comment: 1st intercourse 75 yo-Fewer than 5 partners   Other Topics Concern  . None   Social History Narrative   Financial controller Pulmonary:   Originally from [place]. Moved to [place] in 2012. 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    REVIEW OF SYSTEMS:  Denies any rashes or abnormal bruising. No hematuria or dysuria. A pertinent 14 point review of systems is negative except as per the history of presenting illness.  SUBJECTIVE: As above.  VITAL SIGNS: BP 130/81   Pulse 95   Temp 97.7 F (36.5 C) (Oral)   Resp 21   Ht 5\' 2"  (1.575 m)   Wt 188 lb (85.3 kg)   SpO2 94%   BMI 34.39 kg/m   HEMODYNAMICS:    VENTILATOR SETTINGS:    INTAKE / OUTPUT: No intake/output data recorded.  PHYSICAL EXAMINATION: General:  Awake. Alert. No acute distress. Son at bedside.  Integument:  Warm & dry. No rash on exposed skin. No bruising on exposed skin. Lymphatics:  No appreciated cervical or supraclavicular lymphadenoapthy. HEENT:  Moist mucus membranes. No oral ulcers. No scleral injection or icterus.  Cardiovascular:  Regular rate and rhythm. No edema. No  appreciable JVD.  Pulmonary:  Clear to auscultation bilaterally. Speaking in complete sentences. Normal work of breathing on nasal cannula oxygen. Abdomen: Soft. Normal bowel sounds. Nondistended. Grossly nontender. Musculoskeletal:  Normal bulk and tone. Hand grip strength 5/5 bilaterally. No joint deformity or effusion appreciated. Neurological:  CN 2-12 grossly in tact. No meningismus. Moving all 4 extremities equally.  Psychiatric:  Mood and affect congruent. Speech normal rhythm, rate & tone.   LABS:  BMET  Recent Labs Lab 03/14/16 1420 03/17/16 0240  NA 137 141  K 3.6 3.5  CL 107 113*  CO2 20* 15*  BUN 14 12  CREATININE 0.93 1.00  GLUCOSE 111* 117*    Electrolytes  Recent Labs Lab 03/14/16 1420 03/17/16 0240  CALCIUM 8.8* 9.0    CBC  Recent Labs Lab 03/14/16 1420 03/15/16 0524 03/17/16 0240  WBC 8.1 6.3 6.6  HGB 15.0 14.1 13.9  HCT 43.6 41.6 40.7  PLT 229 177 34*    Coag's  Recent Labs Lab 03/14/16 1537 03/15/16 0524  INR 0.89 1.02    Sepsis Markers  No results for input(s): LATICACIDVEN, PROCALCITON, O2SATVEN in the last 168 hours.  ABG No results for input(s): PHART, PCO2ART, PO2ART in the last 168 hours.  Liver Enzymes No results for input(s): AST, ALT, ALKPHOS, BILITOT, ALBUMIN in the last 168 hours.  Cardiac Enzymes  Recent Labs Lab 03/14/16 1800 03/14/16 2329 03/15/16 0524  TROPONINI 0.57* 0.35* 0.15*    Glucose No results for input(s): GLUCAP in the last 168 hours.  Imaging Ct Angio Chest Pe W And/or Wo Contrast  Result Date: 03/17/2016 CLINICAL DATA:  Chronic shortness of breath. Elevated D-dimer. Patient on Estrogen patches. Initial encounter. EXAM: CT ANGIOGRAPHY CHEST WITH CONTRAST TECHNIQUE: Multidetector CT imaging of the chest was performed using the standard protocol during bolus administration of intravenous contrast. Multiplanar CT image reconstructions and MIPs were obtained to evaluate the vascular anatomy.  CONTRAST:  80 mL of Isovue 370 IV contrast COMPARISON:  Chest radiograph performed 03/14/2016 FINDINGS: Cardiovascular: Bilateral pulmonary embolus is noted, extending from the main pulmonary arteries to all lobes of both lungs. The RV/LV ratio of 2.2 corresponds to significant right heart strain and at least submassive pulmonary embolus. Scattered calcification is noted along the thoracic aorta. The proximal great vessels are grossly unremarkable. Mediastinum/Nodes: The mediastinum is grossly unremarkable in appearance. No mediastinal lymphadenopathy is seen. No pericardial effusion is identified. The thyroid gland is diminutive and not well characterized. No axillary lymphadenopathy is seen. Lungs/Pleura: Mild patchy bilateral atelectasis is noted. No pleural effusion or pneumothorax is seen. No masses are identified. Upper Abdomen: The visualized portions of the liver and spleen are unremarkable. The visualized portions of the pancreas is within normal limits. Musculoskeletal: No acute osseous abnormalities are identified. Osseous fusion is noted at T7-T8. The visualized musculature is unremarkable in appearance. Review of the MIP images confirms the above findings. IMPRESSION: 1. Bilateral pulmonary embolus, extending from the main pulmonary arteries to all lobes of both lungs. Positive for acute PE with CT evidence of right heart strain (RV/LV Ratio = 2.2) consistent with at least submassive (intermediate risk) PE. The presence of right heart strain has been associated with an increased risk of morbidity and mortality. Please activate Code PE by paging [phone]. 2. Mild patchy bilateral atelectasis noted; lungs otherwise clear. 3. Osseous fusion at T7-T8. Critical Value/emergent results were called by telephone at the time of interpretation on 03/17/2016 at 4:21 am to Dr. Pryor Curia, who verbally acknowledged these results. Electronically Signed   By: Garald Balding M.D.   On: 03/17/2016 04:29      STUDIES:  LHC 03/16/16:  Nonobstructive CAD w/ mid-LAD irregularity. Normal LV function. CTA 11/29: Bilateral pulmonary embolism extending from Main pulmonary artery to all lobes of both lungs. Positive for acute pulmonary embolism with RV/LV ratio 2.2.  MICROBIOLOGY: MRSA PCR 11/26:  Negative  ANTIBIOTICS: None  SIGNIFICANT EVENTS: 11/26 - 11/28 - Admit w/ Unstable angina w/ LHC on 11/28  11/29 - Admit  LINES/TUBES: PIV  ASSESSMENT / PLAN:  75 y.o. female with progressively worsening dyspnea over the last year. Patient was recently admitted with a left heart catheterization and treated with heparin for systemic anticoagulation while awaiting left heart catheterization. She returns this evening with acute worsening in her dyspnea and symptoms certainly concerning for possible right heart strain. However, patient has a significant thrombocytopenia raising the concern for HIT. Her ongoing headache will need to be evaluated for possible intracranial hemorrhage with imaging. I will continue with the plan for systemic anticoagulation utilizing an argatroban infusion. Plan to monitor  the patient closely for signs of further bleeding seen or unseen in the intensive care unit given the severity of her thrombocytopenia. Given the severity of her thrombocytopenia I feel the risk of hemorrhage is too great from systemic or catheter directed thrombolytic therapy. I did discuss this with the patient and her son at bedside. I did discuss the risk of seen and I'm seeing bleeding with the patient and her son at bedside explaining that she should notify her nurse and staff immediately if she experiences any worsening or change in her headache, worsening breathing, near syncope, nausea, abdominal pain, or bleeding that she visualizes with bowel movements or urinating.  1. Pulmonary Embolism: Suspect secondary to heparin-induced thrombocytopenia with thrombosis. Initiating systemic anticoagulation with  argatroban per pharmacy protocol. Checking venous duplex of bilateral lower extremities as well as right upper extremity given recent left heart catheterization. Also checking complete echocardiogram. 2. Thrombocytopenia:  Obtaining peripheral smear for pathologist review. Sending hit antibody. Trending platelet count with CBC. 3. Headache:  Checking CT Head w/o contrast stat. Neuro checks every 4 hours. 4. Non-obstructive Coronary Artery Disease: Continuing to trend troponin I. Continuing patient on statin and beta blocker. 5. Essential Hypertension: Continuing Coreg & Norvasc at home doses. Holding Lisinopril. Vitals per unit protocol. 6. Hyperlipidemia: Continuing Lipitor 80 mg by mouth daily. 7. Hypothyroidism: Continuing home Synthroid 100 g by mouth daily.  8. Possible GI Bleeding: Continuing Protonix drip after bolus ordered by hospitalist service. Continuing to trend hemoglobin with CBC every 6 hours.  9. Prophylaxis: Systemic anticoagulation with R Bactroban & Protonix drip.  10. Diet: Nothing by mouth except for meds.   11. Code Status:  Patient is full code but would not wish for a prolonged ventilation. Son is healthcare power of attorney and was present during conversation.  12. Disposition: Admitting patient to intensive care unit.  I have spent a total of 45 minutes of critical care time caring for the patient, discussing plan of care with hospitalist, and reviewing the patient's electronic medical record.  Sonia Baller Ashok Cordia, M.D. [facility] Pulmonary & Critical Care Pager:  [phone] After 3pm or if no response, call [phone] 03/17/2016, 5:49 AM

## 2016-03-17 NOTE — Procedures (Signed)
Central Venous Catheter Insertion Procedure Note GAILENE PIKER [id number] [birth year]  Procedure: Insertion of Central Venous Catheter Indications: Drug and/or fluid administration  Procedure Details Consent: Unable to obtain consent because of emergent medical necessity. Time Out: Verified patient identification, verified procedure, site/side was marked, verified correct patient position, special equipment/implants available, medications/allergies/relevent history reviewed, required imaging and test results available.  Performed  Maximum sterile technique was used including antiseptics, cap, gloves, gown, hand hygiene, mask and sheet. Skin prep: Chlorhexidine; local anesthetic administered A antimicrobial bonded/coated triple lumen catheter was placed in the right femoral vein due to emergent situation using the Seldinger technique. Ultrasound guidance used.Yes.   Catheter placed to 20 cm. Blood aspirated via all 3 ports and then flushed x 3. Line sutured x 2 and dressing applied.  Evaluation Blood flow good Complications: No apparent complications Patient did tolerate procedure well. Chest X-ray ordered to verify placement.    Richardson Landry Minor ACNP Maryanna Shape PCCM Pager [phone] till 3 pm If no answer page [phone] 03/17/2016, 1:10 PM  Rush Farmer, M.D. [facility] Pulmonary/Critical Care Medicine. Pager: [phone]. After hours pager: [phone].

## 2016-03-17 NOTE — H&P (Signed)
History and Physical    Michele Thompson [id number] DOB: 1940/12/07 DOA: 03/17/2016  Referring MD/NP/PA:   PCP: Michele Solo, MD   Patient coming from:  The patient is coming from home.  At baseline, pt is independent for most of ADL.  Chief Complaint: Shortness of breath, rectal bleeding  HPI: Michele Thompson is a 75 y.o. female with medical history significant of hypertension, hyperlipidemia, hypothyroidism, on estradiol patch, who presents with shortness of breath and rectal bleeding.  Patient states that he has been having shortness of breath for more than 6 weeks. He was hospitalized from 11/26-11/28. He was found to have NSTEMI with elevated troponin 0.57. She underwent cardiac cath 11/27, which showed mild non-obstructive CAD, mid-LAD irregularity and normal LV function. In that admission, pulmonary embolism was considered, but without risk factors and imaging was not performed. Pt states that she continues to have SOB, but denies chest pain, fever or chills. No calf tenderness. She has mild nausea, but no vomiting, abdominal pain or diarrhea. Patient states that she has intermittent rectal bleeding for several months, which is more frequent in the past several days. No lightheadedness or dizziness. Patient denies symptoms of UTI or unilateral weakness. Patient states that she had a negative colonoscopy 10 years ago, no EGD done in the past.  ED Course: pt was found to have positive d-dimer 3.56, hemoglobin 13.9, thrombocytopenia with platelets 34 (patient had platelet 177, received heparin in previous admission), INR 1.02, troponin 0.16, creatinine normal, temperature normal, oxygen saturation 90% on room air, which improved to 94% on 2 L nasal cannula oxygen. CT angiogram of chest showed bilateral pulmonary embolus, extending from the main pulmonary arteries to all lobes of both lungs, with CT evidence of right heart strain (RV/LV Ratio = 2.2) consistent with at least submassive  (intermediate risk) PE. Pt is admitted to stepdown as inpatient. PCCM, Dr. Ashok Cordia was consulted.  Review of Systems:   General: no fevers, chills, no changes in body weight, has fatigue HEENT: no blurry vision, hearing changes or sore throat Respiratory: has dyspnea, coughing, no wheezing CV: no chest pain, no palpitations GI: has nausea, no vomiting, abdominal pain, diarrhea, constipation GU: no dysuria, burning on urination, increased urinary frequency, hematuria  Ext: no leg edema Neuro: no unilateral weakness, numbness, or tingling, no vision change or hearing loss Skin: no rash, no skin tear. MSK: No muscle spasm, no deformity, no limitation of range of movement in spin Heme: No easy bruising.  Travel history: No recent long distant travel.  Allergy:  Allergies  Allergen Reactions  . Heparin     HIT panel pending    Past Medical History:  Diagnosis Date  . Coronary artery disease 03/16/2016   non-obstructive noted on left heart cath  . Hypertension   . Thyroid disease     Past Surgical History:  Procedure Laterality Date  . CARDIAC CATHETERIZATION N/A 03/15/2016   Procedure: Left Heart Cath and Coronary Angiography;  Surgeon: Michele Mocha, MD;  Location: [facility];  Service: Cardiovascular;  Laterality: N/A;  . VAGINAL HYSTERECTOMY  1983   endometriosis    Social History:  reports that she has never smoked. She has never used smokeless tobacco. She reports that she does not drink alcohol or use drugs.  Family History:  Family History  Problem Relation Age of Onset  . Heart failure Mother   . Heart failure Father   . Heart failure Maternal Grandmother   . Myasthenia gravis Brother   .  Stroke Brother      Prior to Admission medications   Medication Sig Start Date End Date Taking? Authorizing Provider  amLODipine (NORVASC) 5 MG tablet Take 1 tablet (5 mg total) by mouth daily. 03/17/16   Cheryln Manly, NP  aspirin 81 MG tablet Take 81 mg by  mouth daily.    Historical Provider, MD  atorvastatin (LIPITOR) 80 MG tablet Take 1 tablet (80 mg total) by mouth daily at 6 PM. 03/16/16   Cheryln Manly, NP  carvedilol (COREG) 6.25 MG tablet Take 1 tablet (6.25 mg total) by mouth 2 (two) times daily with a meal. 03/16/16   Cheryln Manly, NP  Cholecalciferol (VITAMIN D PO) Take by mouth.    Historical Provider, MD  estradiol (VIVELLE-DOT) 0.05 MG/24HR patch PLACE 1 PATCH ONTO THE SKIN TWICE A WEEK Patient taking differently: Place 1 patch onto the skin 2 (two) times a week. PLACE 1 PATCH ONTO THE SKIN TWICE A WEEK 02/13/16   Anastasio Auerbach, MD  hydrochlorothiazide (MICROZIDE) 12.5 MG capsule Take 12.5 mg by mouth daily.    Historical Provider, MD  levothyroxine (SYNTHROID, LEVOTHROID) 100 MCG tablet Take 100 mcg by mouth daily before breakfast.    Historical Provider, MD  lisinopril (PRINIVIL,ZESTRIL) 40 MG tablet Take 1 tablet (40 mg total) by mouth daily. 03/17/16   Cheryln Manly, NP    Physical Exam: Vitals:   03/17/16 0216 03/17/16 0218 03/17/16 0300 03/17/16 0303  BP:  133/83 130/81   Pulse:  92 92 95  Resp:  18 16 21   Temp:  97.7 F (36.5 C)    TempSrc:  Oral    SpO2:  96% 90% 94%  Weight: 85.3 kg (188 lb)     Height: 5\' 2"  (1.575 m)      General: Not in acute distress HEENT:       Eyes: PERRL, EOMI, no scleral icterus.       ENT: No discharge from the ears and nose, no pharynx injection, no tonsillar enlargement.        Neck: No JVD, no bruit, no mass felt. Heme: No neck lymph node enlargement. Cardiac: S1/S2, RRR, No murmurs, No gallops or rubs. Respiratory: Good air movement bilaterally. No rales, wheezing, rhonchi or rubs. GI: Soft, nondistended, nontender, no rebound pain, no organomegaly, BS present. GU: No hematuria Ext: No pitting leg edema bilaterally. 2+DP/PT pulse bilaterally. Musculoskeletal: No joint deformities, No joint redness or warmth, no limitation of ROM in spin. Skin: No rashes.  Neuro:  Alert, oriented X3, cranial nerves II-XII grossly intact, moves all extremities normally.  Psych: Patient is not psychotic, no suicidal or hemocidal ideation.  Labs on Admission: I have personally reviewed following labs and imaging studies  CBC:  Recent Labs Lab 03/14/16 1420 03/15/16 0524 03/17/16 0240  WBC 8.1 6.3 6.6  NEUTROABS 5.2  --  3.9  HGB 15.0 14.1 13.9  HCT 43.6 41.6 40.7  MCV 91.0 91.0 92.3  PLT 229 177 34*   Basic Metabolic Panel:  Recent Labs Lab 03/14/16 1420 03/17/16 0240  NA 137 141  K 3.6 3.5  CL 107 113*  CO2 20* 15*  GLUCOSE 111* 117*  BUN 14 12  CREATININE 0.93 1.00  CALCIUM 8.8* 9.0   GFR: Estimated Creatinine Clearance: 49.3 mL/min (by C-G formula based on SCr of 1 mg/dL). Liver Function Tests: No results for input(s): AST, ALT, ALKPHOS, BILITOT, PROT, ALBUMIN in the last 168 hours. No results for input(s): LIPASE, AMYLASE in  the last 168 hours. No results for input(s): AMMONIA in the last 168 hours. Coagulation Profile:  Recent Labs Lab 03/14/16 1537 03/15/16 0524  INR 0.89 1.02   Cardiac Enzymes:  Recent Labs Lab 03/14/16 1800 03/14/16 2329 03/15/16 0524  TROPONINI 0.57* 0.35* 0.15*   BNP (last 3 results) No results for input(s): PROBNP in the last 8760 hours. HbA1C:  Recent Labs  03/14/16 1800  HGBA1C 5.5   CBG: No results for input(s): GLUCAP in the last 168 hours. Lipid Profile:  Recent Labs  03/15/16 0524  CHOL 142  HDL 44  LDLCALC 83  TRIG 73  CHOLHDL 3.2   Thyroid Function Tests: No results for input(s): TSH, T4TOTAL, FREET4, T3FREE, THYROIDAB in the last 72 hours. Anemia Panel: No results for input(s): VITAMINB12, FOLATE, FERRITIN, TIBC, IRON, RETICCTPCT in the last 72 hours. Urine analysis: No results found for: COLORURINE, APPEARANCEUR, LABSPEC, [place], GLUCOSEU, HGBUR, BILIRUBINUR, [place], [place], UROBILINOGEN, NITRITE, LEUKOCYTESUR Sepsis  Labs: @LABRCNTIP (procalcitonin:4,lacticidven:4) ) Recent Results (from the past 240 hour(s))  MRSA PCR Screening     Status: None   Collection Time: 03/14/16  8:25 PM  Result Value Ref Range Status   MRSA by PCR NEGATIVE NEGATIVE Final    Comment:        The GeneXpert MRSA Assay (FDA approved for NASAL specimens only), is one component of a comprehensive MRSA colonization surveillance program. It is not intended to diagnose MRSA infection nor to guide or monitor treatment for MRSA infections.      Radiological Exams on Admission: Ct Angio Chest Pe W And/or Wo Contrast  Result Date: 03/17/2016 CLINICAL DATA:  Chronic shortness of breath. Elevated D-dimer. Patient on Estrogen patches. Initial encounter. EXAM: CT ANGIOGRAPHY CHEST WITH CONTRAST TECHNIQUE: Multidetector CT imaging of the chest was performed using the standard protocol during bolus administration of intravenous contrast. Multiplanar CT image reconstructions and MIPs were obtained to evaluate the vascular anatomy. CONTRAST:  80 mL of Isovue 370 IV contrast COMPARISON:  Chest radiograph performed 03/14/2016 FINDINGS: Cardiovascular: Bilateral pulmonary embolus is noted, extending from the main pulmonary arteries to all lobes of both lungs. The RV/LV ratio of 2.2 corresponds to significant right heart strain and at least submassive pulmonary embolus. Scattered calcification is noted along the thoracic aorta. The proximal great vessels are grossly unremarkable. Mediastinum/Nodes: The mediastinum is grossly unremarkable in appearance. No mediastinal lymphadenopathy is seen. No pericardial effusion is identified. The thyroid gland is diminutive and not well characterized. No axillary lymphadenopathy is seen. Lungs/Pleura: Mild patchy bilateral atelectasis is noted. No pleural effusion or pneumothorax is seen. No masses are identified. Upper Abdomen: The visualized portions of the liver and spleen are unremarkable. The visualized  portions of the pancreas is within normal limits. Musculoskeletal: No acute osseous abnormalities are identified. Osseous fusion is noted at T7-T8. The visualized musculature is unremarkable in appearance. Review of the MIP images confirms the above findings. IMPRESSION: 1. Bilateral pulmonary embolus, extending from the main pulmonary arteries to all lobes of both lungs. Positive for acute PE with CT evidence of right heart strain (RV/LV Ratio = 2.2) consistent with at least submassive (intermediate risk) PE. The presence of right heart strain has been associated with an increased risk of morbidity and mortality. Please activate Code PE by paging [phone]. 2. Mild patchy bilateral atelectasis noted; lungs otherwise clear. 3. Osseous fusion at T7-T8. Critical Value/emergent results were called by telephone at the time of interpretation on 03/17/2016 at 4:21 am to Dr. Pryor Curia, who verbally acknowledged these  results. Electronically Signed   By: Garald Balding M.D.   On: 03/17/2016 04:29     EKG: Independently reviewed.  Not done in ED, will get one.   Assessment/Plan Principal Problem:   PE (pulmonary thromboembolism) (HCC) Active Problems:   Hyperlipidemia   Hypertension   Elevated troponin   Hypothyroidism   Rectal bleeding   Thrombocytopenia (HCC)   Acute respiratory failure with hypoxia (HCC)   Acute respiratory failure with hypoxia due to PE: CTA showed bilateral PE with R heart straining, indicating at least submassive PE. Currently hemodynamically stable. Pt has thrombocytopenia, concerning for HIT,will avoid using heparin.  -admit to stepdown as inpt - IV argatroban is initiated -2D echocardiogram ordered -LE dopplers ordered to evaluate for DVT -trop x 3 -pain control: When necessary Percocet  Thrombocytopenia: Platelet is 34, concerning for HIT due to recent use of heparin. Will also need to r/o TTP -check HIT ab -check peripheral smear  Rectal bleeding: Hemoglobin  stable at 13.9. No dizziness or lightheadedness. No tachycardia. Hemodynamically stable. - Start IV pantoprazole gtt - Zofran IV for nausea - Avoid NSAIDs - hold ASA - Maintain IV access (2 large bore IVs if possible). - Monitor closely and follow q6h cbc, transfuse as necessary.  HLD: Last LDL was 83 on 03/15/16 -Continue home medications: Lipitor  HTN: -continue amlodipine, lisinopril, Coreg -Hold HCTZ  Elevated troponin: trop trending down from 0.75 on previous admission-->0.16. No CP.  L cardiac cath 11/27 showed mild non-obstructive CAD, mid-LAD irregularity and normal LV function. -hold ASA due to GIB -continue Lipitor and Coreg  Hypothyroidism: Last TSH was 5.105 on 12/15/12 -Continue home Synthroid   DVT ppx: on Argatroban Code Status: partial code (OK with CPR, but no intubation) Family Communication: Yes, patient's son t bed side Disposition Plan:  Anticipate discharge back to previous home environment Consults called:  PCCM, Dr. Ashok Cordia Admission status: SDU/inpation       Date of Service 03/17/2016    Ivor Costa [facility] Pager [phone]  If 7PM-7AM, please contact night-coverage [email or web address] Password [email or web address] 03/17/2016, 5:31 AM

## 2016-03-17 NOTE — Progress Notes (Signed)
ANTICOAGULATION CONSULT NOTE - Initial Consult  Pharmacy Consult for Argatroban  Indication: new PE and suspected HIT  Allergies  Allergen Reactions  . Heparin     HIT panel pending    Patient Measurements: Height: 5\' 2"  (157.5 cm) Weight: 188 lb (85.3 kg) IBW/kg (Calculated) : 50.1  Vital Signs: Temp: 98.3 F (36.8 C) (11/29 0841) Temp Source: Oral (11/29 0841) BP: 142/58 (11/29 0815) Pulse Rate: 88 (11/29 0815)  Labs:  Recent Labs  03/14/16 1420  03/14/16 2329 03/15/16 0524 03/17/16 0240 03/17/16 0534 03/17/16 0546 03/17/16 0811  HGB 15.0  --   --  14.1 13.9 13.3  --   --   HCT 43.6  --   --  41.6 40.7 39.9  --   --   PLT 229  --   --  177 34* 151 150  --   APTT  --   --   --   --   --  26 26 44*  LABPROT  --   < >  --  13.5  --  13.6 13.8  --   INR  --   < >  --  1.02  --  1.04 1.06  --   HEPARINUNFRC  --   --  0.33 0.39  --   --   --   --   CREATININE 0.93  --   --   --  1.00  --   --   --   TROPONINI  --   < > 0.35* 0.15*  --  0.12*  --   --   < > = values in this interval not displayed.  Estimated Creatinine Clearance: 49.3 mL/min (by C-G formula based on SCr of 1 mg/dL).   Medical History: Past Medical History:  Diagnosis Date  . Coronary artery disease 03/16/2016   non-obstructive noted on left heart cath  . Hypertension   . Thyroid disease     Assessment: 75 y.o. female with new finding of bilateral PE on CT with high suspicion for HIT. Patient received heparin at previous admission (last 11/27) and has 4T score of 7. Hgb stable, but platelets 34 on this admission. Platelets improved to 150. aPTT subtherapeutic at 44 seconds and argatroban gtt increased to reach goal aPTT 50-90 seconds. Patient also with possible GIB and is currently on pantoprazole gtt. No new overt s/s bleeding noted.   Goal of Therapy:  aPTT 50-90 seconds  Monitor platelets by anticoagulation protocol: Yes   Plan:  Increase argatroban to 1.2 mcg/kg/min   aPTT in 2 hours   Daily CBC and aPTT  Monitor for s/s bleeding  F/u HIT panel   Argie Ramming, PharmD Pharmacy Resident  Pager [phone] 03/17/16 11:05 AM

## 2016-03-17 NOTE — Progress Notes (Signed)
Called by bedside RN, patient was started on norvasc and coreg, BP now in the 60.  Patient diaphoretic and dizzy.  Placed back in bed.  D/C all anti-HTN and start a neo drip for BP support.  The patient is critically ill with multiple organ systems failure and requires high complexity decision making for assessment and support, frequent evaluation and titration of therapies, application of advanced monitoring technologies and extensive interpretation of multiple databases.   Critical Care Time devoted to patient care services described in this note is  35  Minutes. This time reflects time of care of this signee Dr Jennet Maduro. This critical care time does not reflect procedure time, or teaching time or supervisory time of PA/NP/Med student/Med Resident etc but could involve care discussion time.  Rush Farmer, M.D. [facility] Pulmonary/Critical Care Medicine. Pager: [phone]. After hours pager: [phone].

## 2016-03-17 NOTE — Progress Notes (Signed)
Patient in severe respiratory distress on NRB. Hypotensive following fluid bolus. Bradycardic in the 40's then PEA. MD and RT at bedside, see Code Sheet.

## 2016-03-17 NOTE — Progress Notes (Signed)
Pt back to bed from bedside commode. Dizzy, clammy, hypotensive, and SOB. 6L [place] started. MD and NP notified and at bedside. Orders for 500cc fluid bolus and STAT EKG.

## 2016-03-17 NOTE — Procedures (Signed)
CPR  Please see flow sheet.  PEA, ROSC established.  Rush Farmer, M.D. [facility] Pulmonary/Critical Care Medicine. Pager: [phone]. After hours pager: [phone].

## 2016-03-17 NOTE — ED Triage Notes (Signed)
Pt brought in from home via EMS following an episode to which she walked to the bathroom and had an episode of shortness of breath and nausea. Upon EMS arrival pt was seated and the SOB and nausea had subsided but her O2 sat 91% room air. She was seen here Monday for similar complaint and had a R radial heart cath that was negative.

## 2016-03-17 NOTE — Progress Notes (Signed)
*  Preliminary Results* Right upper extremity venous duplex completed. Right upper extremity is positive for subvalvular hyperacute platelet aggregation versus mobile deep vein thrombosis involving the right axillary vein. There is rouleaux flow in the right subclavian and brachial veins.  Bilateral lower extremity venous duplex completed. The right lower extremity is positive for acute, non-mobile, deep vein thrombosis involving the right common femoral, femoral, posterior tibial, and peroneal veins. The left lower extremity is negative for deep vein thrombosis. There is no evidence of Baker's cyst bilaterally.  Preliminary results discussed with Dr. Nelda Marseille.  03/17/2016 2:30 PM  Maudry Mayhew, BS, RVT, RDCS, RDMS

## 2016-03-17 NOTE — ED Provider Notes (Addendum)
By signing my name below, I, Michele Thompson, attest that this documentation has been prepared under the direction and in the presence of [place], DO  Electronically Signed: [place]ton [place], ED Scribe. 03/17/16. 2:43 AM.  TIME SEEN: 2:22 AM  CHIEF COMPLAINT: Shortness of Breath  HPI: HPI Comments:  Michele Thompson is a 75 y.o. female, with a hx of HTN, hypothyroidism who presents to the Emergency Department complaining of worsening, intermittent episodes of SOB x 6 weeks. She notes associated nausea and diaphoresis with these episodes. Pt states she was walking to her bathroom, which is a regular routine, when her symptoms began today. She states she usually experiences SOB with movement, exertion. Pt denies chest pain, fevers, coughs, leg swelling, abdominal pain, vomiting, diarrhea, a hx of PE or DVT, any other associated symptoms and modifying factors at this time. Pt had a left heart catheterization, which was normal, and coronary angiography on 03/15/16. She was told her symptoms were possibly were related to a microvascular issue and was prescribed a blood pressure medication which has helped control her blood pressure. Pt is a non-smoker and denies alcohol use.   Cardiac Cath 03/15/16:  EF visualized to be 55-65%  Dominance: Right  Left Anterior Descending  Mid LAD to Dist LAD lesion, 30% stenosed.  Left Circumflex  Vessel is angiographically normal.  Right Coronary Artery  Vessel is angiographically normal.      ROS: See HPI Constitutional: no fever. Positive diaphoresis.  Eyes: no drainage  ENT: no runny nose   Cardiovascular:  no chest pain  Resp: positive SOB, no cough  GI: no vomiting, diarrhea or abdominal pain. Positive nausea.  GU: no dysuria Integumentary: no rash  Allergy: no hives  Musculoskeletal: no leg swelling  Neurological: no slurred speech, positive headache ROS otherwise negative  PAST MEDICAL HISTORY/PAST SURGICAL HISTORY:  Past Medical History:   Diagnosis Date  . Hypertension   . Thyroid disease     MEDICATIONS:  Prior to Admission medications   Medication Sig Start Date End Date Taking? Authorizing Provider  amLODipine (NORVASC) 5 MG tablet Take 1 tablet (5 mg total) by mouth daily. 03/17/16   Cheryln Manly, NP  aspirin 81 MG tablet Take 81 mg by mouth daily.    Historical Provider, MD  atorvastatin (LIPITOR) 80 MG tablet Take 1 tablet (80 mg total) by mouth daily at 6 PM. 03/16/16   Cheryln Manly, NP  carvedilol (COREG) 6.25 MG tablet Take 1 tablet (6.25 mg total) by mouth 2 (two) times daily with a meal. 03/16/16   Cheryln Manly, NP  Cholecalciferol (VITAMIN D PO) Take by mouth.    Historical Provider, MD  estradiol (VIVELLE-DOT) 0.05 MG/24HR patch PLACE 1 PATCH ONTO THE SKIN TWICE A WEEK Patient taking differently: Place 1 patch onto the skin 2 (two) times a week. PLACE 1 PATCH ONTO THE SKIN TWICE A WEEK 02/13/16   Anastasio Auerbach, MD  hydrochlorothiazide (MICROZIDE) 12.5 MG capsule Take 12.5 mg by mouth daily.    Historical Provider, MD  levothyroxine (SYNTHROID, LEVOTHROID) 100 MCG tablet Take 100 mcg by mouth daily before breakfast.    Historical Provider, MD  lisinopril (PRINIVIL,ZESTRIL) 40 MG tablet Take 1 tablet (40 mg total) by mouth daily. 03/17/16   Cheryln Manly, NP    ALLERGIES:  No Known Allergies  SOCIAL HISTORY:  Social History  Substance Use Topics  . Smoking status: Never Smoker  . Smokeless tobacco: Never Used  . Alcohol use  No    FAMILY HISTORY: Family History  Problem Relation Age of Onset  . Heart failure Mother   . Heart failure Father   . Heart failure Maternal Grandmother   . Myasthenia gravis Brother   . Stroke Brother     EXAM: BP 133/83 (BP Location: Left Arm)   Pulse 92   Temp 97.7 F (36.5 C) (Oral)   Resp 18   Ht 5\' 2"  (1.575 m)   Wt 188 lb (85.3 kg)   SpO2 96%   BMI 34.39 kg/m  CONSTITUTIONAL: Alert and oriented and responds appropriately to  questions. Well-appearing; well-nourished. Elderly  HEAD: Normocephalic EYES: Conjunctivae clear, PERRL, EOMI ENT: normal nose; no rhinorrhea; moist mucous membranes NECK: Supple, no meningismus, no nuchal rigidity, no LAD  CARD: RRR; S1 and S2 appreciated; no murmurs, no clicks, no rubs, no gallops RESP: Normal chest excursion without splinting or tachypnea; breath sounds clear and equal bilaterally; no wheezes, no rhonchi, no rales, no hypoxia or respiratory distress, speaking full sentences ABD/GI: Normal bowel sounds; non-distended; soft, non-tender, no rebound, no guarding, no peritoneal signs, no hepatosplenomegaly BACK:  The back appears normal and is non-tender to palpation, there is no CVA tenderness EXT: Normal ROM in all joints; non-tender to palpation; no edema; normal capillary refill; no cyanosis, no calf tenderness or swelling    SKIN: Normal color for age and race; warm; no rash NEURO: Moves all extremities equally, sensation to light touch intact diffusely, cranial nerves II through XII intact, normal speech PSYCH: The patient's mood and manner are appropriate. Grooming and personal hygiene are appropriate.  MEDICAL DECISION MAKING: Patient here with progressively worsening shortness of breath. States it is worse with exertion but also with minimal movement and even rolling over in the bed. Her lungs are clear to auscultation and she has good aeration, no respiratory distress. No hypoxia. Has never been a smoker and denies history of asthma or COPD. No history of PE or DVT but is on estradiol patches. Recently had a cardiac catheterization which showed a normal ejection fraction and only minimal coronary artery disease in the mid to distal LAD. Will repeat cardiac labs today as she did have elevation of her troponin recently which was thought to possibly be from microvascular disease. We'll also obtain a chest x-ray, d-dimer. EKG redemonstrates anterior lateral T-wave changes compared  to prior but has improved.  Pt c/o mild diffuse HA.  Has had similar headache in past. Requesting Tylenol for pain. Neurologically intact. No head injury. No antiplatelets or anticoagulants.  ED PROGRESS: 3:05 AM  Pt's d-dimer is 3.56. Will obtain a CT of her chest to evaluate for possible pulmonary embolus.  Updated patient and her son. When I entered the room her oxygen saturation is 89% on room air sitting upright in the bed awake. After on 2 L nasal cannula. She does not wear oxygen at home.   4:20 AM  Pt has bilateral pulmonary emboli with severe right-sided heart strain. Will discuss initially with intensivist. She is hemodynamically stable. Likely the reason of her positive troponin. We'll start IV heparin.   4:25 AM  D/w Dr. Jimmy Footman with critical care. Given patient is hemodynamically stable and in no significant distress, I feel and she agrees that the patient can be admitted to step down to medicine service. They will consult on patient in the morning. I do not feel she would be an interventional radiology candidate given multiple small pulmonary emboli bilaterally, no saddle pulmonary embolus.  4:35 AM  Discussed patient's case with hospitalist, Dr. Blaine Hamper.  Recommend admission to step down, inpatient bed.  I will place holding orders per their request. Patient and family (if present) updated with plan. Care transferred to hospitalist service.  On further review, patient is thrombocytopenic. Dr. Blaine Hamper with hospitalist service will discuss with pharmacy if heparin is appropriate in this patient for anticoagulation.  She was recently given heparin during her last admission. 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   She does report intermittent episodes of hematochezia over the past several weeks. Has a colonoscopy scheduled as an outpatient. Last colonoscopy 10 years ago was negative. No melena.  Dr. Blaine Hamper also aware of this.  I reviewed all nursing notes, vitals, pertinent old  records, EKGs, labs, imaging (as available).    EKG Interpretation  Date/Time:  Wednesday March 17 2016 02:09:56 EST Ventricular Rate:  93 PR Interval:  170 QRS Duration: 86 QT Interval:  370 QTC Calculation: 460 R Axis:   -45 Text Interpretation:  Normal sinus rhythm Left axis deviation Inferior infarct , age undetermined ST & T wave abnormality, consider anterolateral ischemia that has improved compared to prior Abnormal ECG Confirmed by Felis Quillin,  DO, Eulalie Speights [id number]) on 03/17/2016 2:20:30 AM        CRITICAL CARE Performed by: Nyra Jabs   Total critical care time: 55 minutes  Critical care time was exclusive of separately billable procedures and treating other patients.  Critical care was necessary to treat or prevent imminent or life-threatening deterioration.  Critical care was time spent personally by me on the following activities: development of treatment plan with patient and/or surrogate as well as nursing, discussions with consultants, evaluation of patient's response to treatment, examination of patient, obtaining history from patient or surrogate, ordering and performing treatments and interventions, ordering and review of laboratory studies, ordering and review of radiographic studies, pulse oximetry and re-evaluation of patient's condition.   I personally performed the services described in this documentation, which was scribed in my presence. The recorded information has been reviewed and is accurate.     [place], DO 03/17/16 [place], DO 03/17/16 [id number]

## 2016-03-17 NOTE — Consult Note (Signed)
CARDIOLOGY CONSULT NOTE   Patient ID: Michele Thompson MRN: [id number] DOB/AGE: 1941/02/04 75 y.o.  Admit date: 03/17/2016  Requesting Physician: Dr. Nelda Marseille Primary Physician:   Tawanna Solo, MD Primary Cardiologist:   Dr. Sallyanne Kuster Reason for Consultation:   PEA arrest   HPI: Michele Thompson is a 75 y.o. female with a history of HTN, HLD, treated hypothyroidism, estrogen therpay and recent admission for chest pain and cath with non obst CAD who presented to [facility] again today for SOB and rectal bleeding. Found to have a submassive PE with right heart strain. Then had PEA arrest with possible ST elevation and cardiology consulted.   She was recently admitted 11/26-11/28/17. She was found to have NSTEMI with elevated troponin 0.57. On 03/15/16 she underwent LHC with Dr. Burt Knack that showed mild non-obstructive CAD with widely patent vessels, and normal LV function. No clear etiology was noted for her cause of angina. She was started on amlodipine in the setting of possible microvascular disease. Labs were noted to be stable post procedure.   Given her dyspnea, risk for PE was considered but she did not have any concerning risk factors. Pain was felt to be caused by vasospasm. Also, was noted to have poor blood pressure control prior to admission. Blood pressure improved with the addition of amlodipine. BNP was elevated on admission, but LVEDP was normal at the time of cath. She was able to ambulate without significant dyspnea on the day of discharge.   She then re presented today with continued SOB and rectal bleeding. In the ED she was found to have a positive d-dimer 3.56, hemoglobin 13.9, thrombocytopenia with platelets 34 (patient had platelet 177, received heparin in previous admission), INR 1.02, troponin 0.16, creatinine normal, temperature normal, oxygen saturation 90% on room air, which improved to 94% on 2 L nasal cannula oxygen. CT angiogram of chest showed bilateral pulmonary embolus,  extending from the main pulmonary arteries to all lobes of both lungs, with CT evidence of right heart strain (RV/LV Ratio = 2.2) consistent with at least submassive (intermediate risk) PE. Michele was admitted to stepdown as inpatient.  Pulmonary critical care was called given right heart strain and possible need for thrombolytics. They started the patient on systemic anticoagulation utilizing an argatroban infusion given thrombocytopenia and concern for HIT. PE felt to be 2/2 heparin-induced thrombocytopenia with thrombosis. She was started on a protonix gtt with possible GI bleeding.   PCCM signed off but then called back later due to hypotension and diaphoresis. Antihypertensives were discontinued and started on neo gtt. She then went into PEA arrest requiring CPR and ACLS. She had ROSC and beside ECHO showed no pericardial effusion, hyperdynamic LV function and a dilated hypo contractile RV per Dr. Camillia Herter report at bedside. She was started on vigorous IV hydration with normal saline. A central line was placed. Currently intubated and sedated and HD stable.     Past Medical History:  Diagnosis Date  . Coronary artery disease 03/16/2016   non-obstructive noted on left heart cath  . Hypertension   . Thyroid disease      Past Surgical History:  Procedure Laterality Date  . CARDIAC CATHETERIZATION N/A 03/15/2016   Procedure: Left Heart Cath and Coronary Angiography;  Surgeon: [name], MD;  Location: [facility];  Service: Cardiovascular;  Laterality: N/A;  . VAGINAL HYSTERECTOMY  1983   endometriosis    Allergies  Allergen Reactions  . Heparin     HIT panel  pending    I have reviewed the patient's current medications . atorvastatin  80 mg Oral q1800  . cholecalciferol  1,000 Units Oral Daily  . levothyroxine  100 mcg Oral QAC breakfast  . sodium chloride  3,000 mL Intravenous Once  . sodium chloride flush  3 mL Intravenous Q12H  . sodium chloride flush  3 mL  Intravenous Q12H   . sodium chloride 75 mL/hr at 03/17/16 0603  . argatroban Stopped (03/17/16 1300)  . norepinephrine (LEVOPHED) Adult infusion    . pantoprozole (PROTONIX) infusion 8 mg/hr (03/17/16 0634)  . phenylephrine (NEO-SYNEPHRINE) Adult infusion Stopped (03/17/16 1323)   sodium chloride, acetaminophen **OR** acetaminophen, ondansetron **OR** ondansetron (ZOFRAN) IV, oxyCODONE-acetaminophen, sodium chloride flush, zolpidem  Prior to Admission medications   Medication Sig Start Date End Date Taking? Authorizing Provider  amLODipine (NORVASC) 5 MG tablet Take 1 tablet (5 mg total) by mouth daily. 03/17/16   Cheryln Manly, NP  aspirin 81 MG tablet Take 81 mg by mouth daily.    Historical Provider, MD  atorvastatin (LIPITOR) 80 MG tablet Take 1 tablet (80 mg total) by mouth daily at 6 PM. 03/16/16   Cheryln Manly, NP  carvedilol (COREG) 6.25 MG tablet Take 1 tablet (6.25 mg total) by mouth 2 (two) times daily with a meal. 03/16/16   Cheryln Manly, NP  Cholecalciferol (VITAMIN D PO) Take by mouth.    Historical Provider, MD  estradiol (VIVELLE-DOT) 0.05 MG/24HR patch PLACE 1 PATCH ONTO THE SKIN TWICE A WEEK Patient taking differently: Place 1 patch onto the skin 2 (two) times a week. PLACE 1 PATCH ONTO THE SKIN TWICE A WEEK 02/13/16   Anastasio Auerbach, MD  hydrochlorothiazide (MICROZIDE) 12.5 MG capsule Take 12.5 mg by mouth daily.    Historical Provider, MD  levothyroxine (SYNTHROID, LEVOTHROID) 100 MCG tablet Take 100 mcg by mouth daily before breakfast.    Historical Provider, MD  lisinopril (PRINIVIL,ZESTRIL) 40 MG tablet Take 1 tablet (40 mg total) by mouth daily. 03/17/16   Cheryln Manly, NP     Social History   Social History  . Marital status: Widowed    Spouse name: N/A  . Number of children: N/A  . Years of education: N/A   Occupational History  . Not on file.   Social History Main Topics  . Smoking status: Never Smoker  . Smokeless tobacco: Never  Used  . Alcohol use No  . Drug use: No  . Sexual activity: No     Comment: 1st intercourse 75 yo-Fewer than 5 partners   Other Topics Concern  . Not on file   Social History Narrative   [facility]:   Originally from [place]. Moved to [place] in 2012. 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    Family Status  Relation Status  . Mother Deceased  . Father Deceased  . Maternal Grandmother   . Brother Deceased  . Brother   . Neg Hx    Family History  Problem Relation Age of Onset  . Heart failure Mother   . Heart failure Father   . Heart failure Maternal Grandmother   . Myasthenia gravis Brother   . Stroke Brother   . Clotting disorder Neg Hx     ROS:  Full 14 point review of systems complete and found to be negative unless listed above.  Physical Exam: Blood pressure 124/70, pulse (!) 122, temperature 97.7 F (36.5 C), temperature source  Oral, resp. rate (!) 34, height 5\' 2"  (1.575 m), weight 188 lb (85.3 kg), SpO2 (!) 47 %.  General: intubated and sedated  Head: Eyes PERRLA, No xanthomas.   Normocephalic and atraumatic, oropharynx without edema or exudate. Dentition:  Lungs: intubated Heart: HRRR S1 S2, no rub/gallop, Heart regular rate and rhythm with S1, S2  murmur. pulses are 2+ extrem.   Neck: No carotid bruits. No lymphadenopathy. no JVD. Abdomen: Bowel sounds present, abdomen soft and non-tender without masses or hernias noted. Msk:  No spine or cva tenderness. No weakness, no joint deformities or effusions. Extremities: No clubbing or cyanosis. No LE  edema.  Neuro: Alert and oriented X 3. No focal deficits noted. Psych:  Good affect, responds appropriately Skin: No rashes or lesions noted.  Labs:   Lab Results  Component Value Date   WBC 6.8 03/17/2016   HGB 12.4 03/17/2016   HCT 36.8 03/17/2016   MCV 91.8 03/17/2016   PLT 140 (L) 03/17/2016    Recent Labs  03/17/16 0546  INR 1.06      Recent Labs Lab 03/17/16 0240  NA 141  K 3.5  CL 113*  CO2 15*  BUN 12  CREATININE 1.00  CALCIUM 9.0  GLUCOSE 117*   No results found for: MG  Recent Labs  03/14/16 2329 03/15/16 0524 03/17/16 0534 03/17/16 1137  TROPONINI 0.35* 0.15* 0.12* 0.16*    Recent Labs  03/14/16 1511 03/17/16 0253  TROPIPOC 0.53* 0.16*   No results found for: PROBNP Lab Results  Component Value Date   CHOL 142 03/15/2016   HDL 44 03/15/2016   LDLCALC 83 03/15/2016   TRIG 73 03/15/2016   Lab Results  Component Value Date   DDIMER 3.46 (H) 03/17/2016   No results found for: LIPASE, AMYLASE TSH  Date/Time Value Ref Range Status  12/15/2012 12:08 PM 5.105 (H) 0.350 - 4.500 uIU/mL Final   No results found for: VITAMINB12, FOLATE, FERRITIN, TIBC, IRON, RETICCTPCT  Echo:   LHC: 03/16/16 Conclusion 1. Mild non-obstructive CAD, mid-LAD irregularity 2. Widely patent left main, left circumflex, and RCA 3. Normal LV function  No clear etiology to explain exertional or resting angina. The patient may have microvascular angina. A trial of Ca++ Channel blockade may be reasonable.   ECG:  NSR with diffuse TWI, HR 93  Radiology:  Ct Head Wo Contrast  Result Date: 03/17/2016 CLINICAL DATA:  Headache.  Hypertension. EXAM: CT HEAD WITHOUT CONTRAST TECHNIQUE: Contiguous axial images were obtained from the base of the skull through the vertex without intravenous contrast. COMPARISON:  None. FINDINGS: Brain: No evidence of acute infarction, hemorrhage, hydrocephalus, extra-axial collection or mass lesion/mass effect. Vascular: No hyperdense vessel or unexpected calcification. Skull: Normal. Negative for fracture or focal lesion. Sinuses/Orbits: No acute finding. Other: None. IMPRESSION: Negative noncontrast head CT. Electronically Signed   By: Earle Gell M.D.   On: 03/17/2016 07:17   Ct Angio Chest Pe W And/or Wo Contrast  Result Date: 03/17/2016 CLINICAL DATA:  Chronic shortness of breath.  Elevated D-dimer. Patient on Estrogen patches. Initial encounter. EXAM: CT ANGIOGRAPHY CHEST WITH CONTRAST TECHNIQUE: Multidetector CT imaging of the chest was performed using the standard protocol during bolus administration of intravenous contrast. Multiplanar CT image reconstructions and MIPs were obtained to evaluate the vascular anatomy. CONTRAST:  80 mL of Isovue 370 IV contrast COMPARISON:  Chest radiograph performed 03/14/2016 FINDINGS: Cardiovascular: Bilateral pulmonary embolus is noted, extending from the main pulmonary arteries to all lobes of both lungs. The  RV/LV ratio of 2.2 corresponds to significant right heart strain and at least submassive pulmonary embolus. Scattered calcification is noted along the thoracic aorta. The proximal great vessels are grossly unremarkable. Mediastinum/Nodes: The mediastinum is grossly unremarkable in appearance. No mediastinal lymphadenopathy is seen. No pericardial effusion is identified. The thyroid gland is diminutive and not well characterized. No axillary lymphadenopathy is seen. Lungs/Pleura: Mild patchy bilateral atelectasis is noted. No pleural effusion or pneumothorax is seen. No masses are identified. Upper Abdomen: The visualized portions of the liver and spleen are unremarkable. The visualized portions of the pancreas is within normal limits. Musculoskeletal: No acute osseous abnormalities are identified. Osseous fusion is noted at T7-T8. The visualized musculature is unremarkable in appearance. Review of the MIP images confirms the above findings. IMPRESSION: 1. Bilateral pulmonary embolus, extending from the main pulmonary arteries to all lobes of both lungs. Positive for acute PE with CT evidence of right heart strain (RV/LV Ratio = 2.2) consistent with at least submassive (intermediate risk) PE. The presence of right heart strain has been associated with an increased risk of morbidity and mortality. Please activate Code PE by paging [phone]. 2.  Mild patchy bilateral atelectasis noted; lungs otherwise clear. 3. Osseous fusion at T7-T8. Critical Value/emergent results were called by telephone at the time of interpretation on 03/17/2016 at 4:21 am to Dr. Pryor Curia, who verbally acknowledged these results. Electronically Signed   By: Garald Balding M.D.   On: 03/17/2016 04:29   Dg Chest Port 1 View  Result Date: 03/17/2016 CLINICAL DATA:  Central line placement. EXAM: PORTABLE CHEST 1 VIEW COMPARISON:  CT 03/17/2016.  Chest x-ray 03/14/2016. FINDINGS: Endotracheal tube noted with tip projected 1.8 cm above the carina. Heart size stable. Low lung volumes. No focal infiltrate. No pleural effusion or pneumothorax. No acute bony abnormality. IMPRESSION: Endotracheal tube tip noted 1.8 cm above the carina. Low lung volumes. No focal infiltrate Electronically Signed   By: [place]   On: 03/17/2016 13:23    ASSESSMENT AND PLAN:    Principal Problem:   PE (pulmonary thromboembolism) ([facility]) Active Problems:   Hyperlipidemia   Hypertension   Elevated troponin   Hypothyroidism   Rectal bleeding   Thrombocytopenia (HCC)   Headache   Hypoxemia   Heparin induced thrombocytopenia (HCC)   Cardiogenic shock (HCC)  Michele Thompson is a 75 y.o. female with a history of HTN, HLD, treated hypothyroidism, estrogen therpay and recent admission for chest pain and cath with non obst CAD who presented to [facility] again today for SOB and rectal bleeding. Found to have a submassive PE with right heart strain. Then had PEA arrest with possible ST elevation and cardiology consulted.   PEA arrest with cardiogenic shock: PCCM called due to hypotension and diaphoresis. Antihypertensives were discontinued and started on neo gtt. She then went into PEA arrest requiring CPR and ACLS. She had ROSC and beside ECHO showed no pericardial effusion, hyperdynamic LV function and a dilated hypocontractile RV per Dr. Camillia Herter report at bedside. ECG with no diagnostic ST  elevation. She was started on vigorous IV hydration with normal saline. A central line was placed. Currently intubated and sedated and HD stable.   PE with right heart strain: felt to be 2/2 heparin-induced thrombocytopenia with thrombosis. Continue argatroban infusion given recent HIT with heparin last admission. LE dopplers pending to r/o DVT. Continue IVFs   Type II NSTEMI: troponin likely elevated given submassive PE with right heart strain and PEA arrest with CPR. Recent cath  with non obst CAD.   HIT: platelets found to be 34K on admission, No heparin, using argatroban as above. PLTs now 140.   HTN: antihypertensives held given hypotension. Currently requiring pressors.   Rectal bleeding: continue PPI  Signed: Angelena Form, PA-C 03/17/2016 1:47 PM  Pager [phone]  I have personally seen and examined this patient with Angelena Form, PA-C. I agree with the assessment and plan as outlined above. She is known to have mild CAD by cath 2 days ago. She is now readmitted with respiratory distress and found to have HIT and large PE. She had a PEA arrest earlier today. Bedside echo with dilated, hyopkinetic RV, hyperdynamic LV systolic function, no evidence of pericardial effusion. She was resuscitated and is now intubated. She is currently hemodynamically stable on pressors. She is being managed by the PCCM team. Plans to restart Argatroban Would aggressively hydrate Will place TR band on right wrist cath site which is bleeding No other cardiac recs.   Lauree Chandler 03/17/2016 3:14 PM

## 2016-03-17 NOTE — ED Notes (Signed)
Dr Leonides Schanz asked about moving patient to room 15.  Charge nurse stated we needed to wait to move the patient

## 2016-03-17 NOTE — ED Notes (Signed)
Pt to CT

## 2016-03-17 NOTE — Progress Notes (Signed)
Dr Angelena Form called me to place TR band on patients right wrist from a arterial puncture from Monday.  Michele Thompson had a cath done on Monday and got readmitted early this morning.  She had a cath done from the right radial artery on Monday and the site has started to rebleed.  I have placed a TR band on the site and have 12cc of air in the band.  I recommended leaving the band on for 3-4 hrs before starting to attempt to remove air and then I would remove it in the normal fashion of 2-3cc every 15 minutes until air is out then redress site if no bleeding.

## 2016-03-18 ENCOUNTER — Inpatient Hospital Stay (HOSPITAL_COMMUNITY): Payer: Medicare Other

## 2016-03-18 DIAGNOSIS — I214 Non-ST elevation (NSTEMI) myocardial infarction: Secondary | ICD-10-CM

## 2016-03-18 DIAGNOSIS — D7582 Heparin induced thrombocytopenia (HIT): Secondary | ICD-10-CM

## 2016-03-18 DIAGNOSIS — G931 Anoxic brain damage, not elsewhere classified: Secondary | ICD-10-CM

## 2016-03-18 DIAGNOSIS — R570 Cardiogenic shock: Secondary | ICD-10-CM

## 2016-03-18 DIAGNOSIS — R748 Abnormal levels of other serum enzymes: Secondary | ICD-10-CM

## 2016-03-18 LAB — BASIC METABOLIC PANEL
Anion gap: 7 (ref 5–15)
BUN: 12 mg/dL (ref 6–20)
CO2: 20 mmol/L — ABNORMAL LOW (ref 22–32)
Calcium: 6.9 mg/dL — ABNORMAL LOW (ref 8.9–10.3)
Chloride: 115 mmol/L — ABNORMAL HIGH (ref 101–111)
Creatinine, Ser: 1.08 mg/dL — ABNORMAL HIGH (ref 0.44–1.00)
GFR calc Af Amer: 57 mL/min — ABNORMAL LOW (ref >60)
GFR calc non Af Amer: 49 mL/min — ABNORMAL LOW (ref >60)
Glucose, Bld: 108 mg/dL — ABNORMAL HIGH (ref 65–99)
Potassium: 3.2 mmol/L — ABNORMAL LOW (ref 3.5–5.1)
Sodium: 142 mmol/L (ref 135–145)

## 2016-03-18 LAB — APTT
aPTT: 29 seconds (ref 24–36)
aPTT: 42 seconds — ABNORMAL HIGH (ref 24–36)
aPTT: 36 seconds (ref 24–36)
aPTT: 69 seconds — ABNORMAL HIGH (ref 24–36)
aPTT: 51 seconds — ABNORMAL HIGH (ref 24–36)

## 2016-03-18 LAB — PHOSPHORUS: Phosphorus: 2.5 mg/dL (ref 2.5–4.6)

## 2016-03-18 LAB — GLUCOSE, CAPILLARY
Glucose-Capillary: 106 mg/dL — ABNORMAL HIGH (ref 65–99)
Glucose-Capillary: 110 mg/dL — ABNORMAL HIGH (ref 65–99)
Glucose-Capillary: 115 mg/dL — ABNORMAL HIGH (ref 65–99)
Glucose-Capillary: 122 mg/dL — ABNORMAL HIGH (ref 65–99)
Glucose-Capillary: 124 mg/dL — ABNORMAL HIGH (ref 65–99)
Glucose-Capillary: 90 mg/dL (ref 65–99)
Glucose-Capillary: 93 mg/dL (ref 65–99)

## 2016-03-18 LAB — CBC
HCT: 35.3 % — ABNORMAL LOW (ref 36.0–46.0)
HCT: 31.4 % — ABNORMAL LOW (ref 36.0–46.0)
Hemoglobin: 11.7 g/dL — ABNORMAL LOW (ref 12.0–15.0)
Hemoglobin: 10.5 g/dL — ABNORMAL LOW (ref 12.0–15.0)
MCH: 30.5 pg (ref 26.0–34.0)
MCH: 30.9 pg (ref 26.0–34.0)
MCHC: 33.1 g/dL (ref 30.0–36.0)
MCHC: 33.4 g/dL (ref 30.0–36.0)
MCV: 92.2 fL (ref 78.0–100.0)
MCV: 92.4 fL (ref 78.0–100.0)
Platelets: 145 10*3/uL — ABNORMAL LOW (ref 150–400)
Platelets: DECREASED 10*3/uL (ref 150–400)
RBC: 3.83 MIL/uL — ABNORMAL LOW (ref 3.87–5.11)
RBC: 3.4 MIL/uL — ABNORMAL LOW (ref 3.87–5.11)
RDW: 13.2 % (ref 11.5–15.5)
RDW: 13.7 % (ref 11.5–15.5)
WBC: 17.8 10*3/uL — ABNORMAL HIGH (ref 4.0–10.5)
WBC: 15.3 10*3/uL — ABNORMAL HIGH (ref 4.0–10.5)

## 2016-03-18 LAB — TROPONIN I
Troponin I: 0.39 ng/mL (ref <0.03)
Troponin I: 0.36 ng/mL (ref <0.03)
Troponin I: 0.26 ng/mL (ref <0.03)

## 2016-03-18 LAB — PROTIME-INR
INR: 1.36
Prothrombin Time: 16.9 seconds — ABNORMAL HIGH (ref 11.4–15.2)

## 2016-03-18 LAB — CALCIUM, IONIZED: Calcium, Ionized, Serum: 3.4 mg/dL — ABNORMAL LOW (ref 4.5–5.6)

## 2016-03-18 LAB — ALBUMIN: Albumin: 2.5 g/dL — ABNORMAL LOW (ref 3.5–5.0)

## 2016-03-18 LAB — PATHOLOGIST SMEAR REVIEW

## 2016-03-18 LAB — MAGNESIUM: Magnesium: 1.5 mg/dL — ABNORMAL LOW (ref 1.7–2.4)

## 2016-03-18 LAB — HEPARIN INDUCED PLATELET AB (HIT ANTIBODY): Heparin Induced Plt Ab: 0.184 OD (ref 0.000–0.400)

## 2016-03-18 MED ORDER — RESOURCE THICKENUP CLEAR PO POWD
ORAL | Status: DC | PRN
  Filled 2016-03-18: qty 125

## 2016-03-18 MED ORDER — ARGATROBAN 50 MG/50ML IV SOLN
1.1000 ug/kg/min | INTRAVENOUS | Status: AC
  Administered 2016-03-18 (×2): 0.78 ug/kg/min via INTRAVENOUS
  Administered 2016-03-19: 0.86 ug/kg/min via INTRAVENOUS
  Filled 2016-03-18 (×7): qty 50

## 2016-03-18 MED ORDER — MAGNESIUM SULFATE 4 GM/100ML IV SOLN
4.0000 g | Freq: Once | INTRAVENOUS | Status: AC
  Administered 2016-03-18: 4 g via INTRAVENOUS
  Filled 2016-03-18: qty 100

## 2016-03-18 MED ORDER — WHITE PETROLATUM GEL
Status: AC
  Administered 2016-03-18: 10:00:00
  Filled 2016-03-18: qty 1

## 2016-03-18 MED ORDER — PROMETHAZINE HCL 25 MG/ML IJ SOLN
12.5000 mg | Freq: Once | INTRAMUSCULAR | Status: AC
  Administered 2016-03-18: 12.5 mg via INTRAVENOUS
  Filled 2016-03-18: qty 1

## 2016-03-18 MED ORDER — POTASSIUM CHLORIDE 20 MEQ/15ML (10%) PO SOLN
40.0000 meq | ORAL | Status: DC
  Administered 2016-03-18: 40 meq
  Filled 2016-03-18: qty 30

## 2016-03-18 MED ORDER — SODIUM CHLORIDE 0.9 % IV SOLN
30.0000 meq | Freq: Once | INTRAVENOUS | Status: AC
  Administered 2016-03-18: 30 meq via INTRAVENOUS
  Filled 2016-03-18: qty 15

## 2016-03-18 MED FILL — Medication: Qty: 1 | Status: AC

## 2016-03-18 NOTE — Progress Notes (Signed)
eLink Physician-Brief Progress Note Patient Name: Michele Thompson DOB: 03/30/1941 MRN: [id number]   Date of Service  03/18/2016  HPI/Events of Note  Hypokalemia and hypomag  eICU Interventions  Potassium and mag replaced     Intervention Category Intermediate Interventions: Electrolyte abnormality - evaluation and management  , 03/18/2016, 5:55 AM

## 2016-03-18 NOTE — Progress Notes (Signed)
PULMONARY / CRITICAL CARE MEDICINE   Name: Michele Thompson MRN: [id number] DOB: 12/03/1940    ADMISSION DATE:  03/17/2016 CONSULTATION DATE:  03/17/2016  REFERRING MD:  Pryor Curia, D.O. / EDP  CHIEF COMPLAINT:  Pulmonary Embolism  Brief:  75 y.o. female recently admitted for unstable angina with known past medical history significant for hypertension and hypothyroidism. During previous admission patient endorsed exertional dyspnea as well as the sensation of her throat tightening with exertion as well. Symptoms began to occur at rest which prompted her presentation. Troponin I was slightly elevated on admission previously as well and patient underwent left heart catheterization with only nonobstructive coronary artery disease identified. Pulmonary embolism was considered but without risk factors imaging was not performed. Pain was thought to be secondary to vasospasm. Per documentation patient was able to ambulate without significant dyspnea on the day of discharge.   Documentation from the patient's emergency department physician this evening records that the patient has been complaining of worsening and intermittent episodes of shortness of breath for approximately 6 weeks. She has had associated nausea and diaphoresis with these episodes. Patient denied any associated chest pain, fever, cough, leg swelling, abdominal pain, vomiting, or diarrhea. Patient denied any history of pulmonary embolism or DVT. Patient was normotensive at presentation with a normal heart rate and saturating 96% on room air per documentation. PCCM was consulted given the concern for right heart strain and possible need for thrombolytics.   Patient reports ongoing dyspnea for approximately one year that has been more severe over the last 2 months and certainly over the last week. Last evening the patient noticed acute worsening in her shortness of breath with the same sensation as though she could not take a deep inspiratory  breath. Also note the patient complained of a headache which seems to be improving slightly. She did endorse nausea as well as diaphoresis and near-syncope with this episode. Denies any frank chest pain, pressure, or tightness. Does report some mild central abdominal tightness and pain. Patient does endorse some intermittent blood whenever she wipes after having a bowel movement which she attributes to a painful area near her rectum. No prior history of GI bleeding. Patient was on heparin infusion during last admission started on 11/26.  Imaging Dg Chest  03/17/2016 Endotracheal tube tip noted 1.8 cm above the carina. Low lung volumes. No focal infiltrate Electronically Signed   ByMarcello Moores  Register   On: 03/17/2016 13:23   03/18/2016 Bibasilar atelectasis. Follow-up chest x-rays demonstrate clearing suggested.Lines and tubes in stable position.  STUDIES:  LHC 03/16/16:  Nonobstructive CAD w/ mid-LAD irregularity. Normal LV function.  CTA 11/29: Bilateral pulmonary embolism extending from Main pulmonary artery to all lobes of both lungs. Positive for acute pulmonary embolism with RV/LV ratio 2.2.  Bedside Echo 11/29: no pericardial effusion, hyperdynamic LV function and a dilated hypocontractile RV   UE doppler 11/29: positive for subvalvular hyperacute platelet aggregation versus mobile deep vein thrombosis involving the right axillary vein. There is rouleaux flow in the right subclavian and brachial veins.  LE Doppler 11/29: The RLE is positive for acute, non-mobile, DVT involving the right common femoral, femoral, posterior tibial, and peroneal veins.   MICROBIOLOGY: MRSA PCR 11/26:  Negative  ANTIBIOTICS: None  LINES/TUBES: CVC Rt Femoral 11/29>> 2 PIV's 11/29>> ETT 11/29>> OGT 11/29>> Foley 11/29>>  SIGNIFICANT EVENTS: 11/26 - 11/28 - Admit w/ Unstable angina w/ LHC on 11/28  11/29 - Admit with bilateral massive PE's 11/29 S/S HIT placed on  argatroban , HIT panel  pending 11/29 CT head neg 11/29 PEA arrest with cardiogenic shock  Subjective:  Awake and interactive this AM  VITAL SIGNS: BP (!) 161/69   Pulse 86   Temp 98.4 F (36.9 C) (Oral)   Resp (!) 23   Ht 5\' 2"  (1.575 m)   Wt 85.3 kg (188 lb)   SpO2 96%   BMI 34.39 kg/m   HEMODYNAMICS:    VENTILATOR SETTINGS: Vent Mode: CPAP;PSV FiO2 (%):  [40 %-100 %] 40 % Set Rate:  [24 bmp-35 bmp] 35 bmp Vt Set:  [400 mL] 400 mL PEEP:  [5 cmH20] 5 cmH20 Pressure Support:  [5 cmH20] 5 cmH20 Plateau Pressure:  [16 cmH20-22 cmH20] 18 cmH20  INTAKE / OUTPUT: I/O last 3 completed shifts: In: 3457.3 [I.V.:3357.3; IV Piggyback:100] Out: 1025 [Urine:850; Emesis/NG output:175]  PHYSICAL EXAMINATION: General:  Awake. Alert. No acute distress.  Integument:  Mild echymosis in her right forearm, right antecubital fossa and around PIV in left antecubital fossa HEENT:  ETT in places, MMM.   Cardiovascular:  Regular rate and rhythm. 1+ edema in UE's. No appreciable JVD.  Pulmonary:  ETT, rhonchi, comfortable on vent, air sounds bilaterally, no wheeze or crackles Abdomen: Soft. Normal bowel sounds. Nondistended. Grossly nontender. Musculoskeletal:  Normal bulk and tone.  Neurological: awake, follows command, responds to questions by nodding, moves exts  LABS:  BMET  Recent Labs Lab 03/17/16 0240 03/17/16 1433 03/18/16 0512  NA 141 140 142  K 3.5 4.3 3.2*  CL 113* 112* 115*  CO2 15* 15* 20*  BUN 12 9 12   CREATININE 1.00 1.28* 1.08*  GLUCOSE 117* 288* 108*   Electrolytes  Recent Labs Lab 03/17/16 0240 03/17/16 1433 03/18/16 0512  CALCIUM 9.0 6.6* 6.9*  MG  --  1.8 1.5*  PHOS  --  6.0* 2.5   CBC  Recent Labs Lab 03/17/16 1612 03/17/16 2015 03/18/16 0512  WBC 15.8* 17.5* 17.8*  HGB 9.8* 12.6 11.7*  HCT 29.5* 37.8 35.3*  PLT 144* 184 145*    Coag's  Recent Labs Lab 03/17/16 1137 03/17/16 1612 03/17/16 2015 03/18/16 0512  APTT 38* 74*  --  29  INR  --  2.14 1.76  1.36    Sepsis Markers No results for input(s): LATICACIDVEN, PROCALCITON, O2SATVEN in the last 168 hours.  ABG No results for input(s): PHART, PCO2ART, PO2ART in the last 168 hours.  Liver Enzymes No results for input(s): AST, ALT, ALKPHOS, BILITOT, ALBUMIN in the last 168 hours.  Cardiac Enzymes  Recent Labs Lab 03/17/16 0534 03/17/16 1137 03/17/16 1612  TROPONINI 0.12* 0.16* 0.36*   Glucose  Recent Labs Lab 03/17/16 0840 03/17/16 2352 03/18/16 0140 03/18/16 0402  GLUCAP 111* 124* 115* 122*   ASSESSMENT / PLAN:  PULMONARY A: PE with rt heart strain: likely due to HIT with thrombosis. LE doppler with DVT UE dopplers with DVT Intubated 11/29 due to PEA and respiratory failure P:   Extubate Titrate O2 for sat of 88-92% Resume argatroban infusion. HIT score high but HIT labs pending SLP eval PT eval  CARDIOVASCULAR A: NSTEMI: troponin elevation likely due to PE with right heart strain and PEA arrest with CPR on 11/29.  S/p TNKase Recent cath with non obst CAD. Elevated BNP Essential Hypertension Hyperlipidemia P:  On levophed gtt. Titrate for goal MAP >65 Need lasix when stable KVO IVF Restart argatroban Trend troponin Hold home BP meds  Tele Follow Echo Cards following  RENAL A:   AKI: improving  HypoK and Mg HypoCa P:   KCl 40 mEq x2 Mg 4 mg once Follow I & O BMP daily  GASTROINTESTINAL A: Possible GI Bleeding Nausea  P:   Protonix drip NPO for now Diet after swallow test  HEMATOLOGIC A:  PE likely due to HIT. 4T score 7. DIC panel neg except elevated D-dimer S/p TNKase LE doppler with DVT UE dopplers with DVT Mild Thrombocytopenia: stable Leucocytosis: likely stress Hgb stable P:  Appreciate VVS recs;  - Continue argatroban. Held overngiht due to oozing from radial site  - Transition to heparin if HIT negative  - No role for venous thrombectomy or SVC filter as risk of procedure far outweighs any perceived  benefit. Start NOAC in AM Peripheral smear for pathologist review. Follow HIT antibody.  Trending platelet count with CBC.  INFECTIOUS A:   Mild leukocytosis likely stress Afebrile P:   Trend CBC  ENDOCRINE A:   Hypothyroidism P:   Continue home synthroid  NEUROLOGIC A:   Headache Neg CT Head w/o contrast P:   RASS goal: 0 to 1 D/C fentanyl gtt  D/C versed  FAMILY  - Updates: Family updated at length bedside  - Inter-disciplinary family meet or Palliative Care meeting due by 03/26/2016  The patient is critically ill with multiple organ systems failure and requires high complexity decision making for assessment and support, frequent evaluation and titration of therapies, application of advanced monitoring technologies and extensive interpretation of multiple databases.   Critical Care Time devoted to patient care services described in this note is  35  Minutes. This time reflects time of care of this signee Dr Jennet Maduro. This critical care time does not reflect procedure time, or teaching time or supervisory time of PA/NP/Med student/Med Resident etc but could involve care discussion time.  Rush Farmer, M.D. [facility] Pulmonary/Critical Care Medicine. Pager: [phone]. After hours pager: [phone].

## 2016-03-18 NOTE — Progress Notes (Signed)
ANTICOAGULATION CONSULT NOTE - Follow Up Consult  Pharmacy Consult for Argatroban Indication: new PE and suspected HIT  Allergies  Allergen Reactions  . Heparin     HIT panel pending    Patient Measurements: Height: 5\' 2"  (157.5 cm) Weight: 188 lb (85.3 kg) IBW/kg (Calculated) : 50.1   Vital Signs: Temp: 97.8 ?F (36.6 ?C) (11/30 1141) Temp Source: Oral (11/30 1141) BP: 128/53 (11/30 1200) Pulse Rate: 65 (11/30 1200)  Labs:  Recent Labs  03/17/16 0240  03/17/16 1137 03/17/16 1433 03/17/16 1612 03/17/16 2015 03/18/16 0512 03/18/16 1015 03/18/16 1120  HGB 13.9  < > 12.4 12.3 9.8* 12.6 11.7*  --   --   HCT 40.7  < > 36.8 37.5 29.5* 37.8 35.3*  --   --   PLT 34*  < > 140* 145* 144* 184 145*  --   --   APTT  --   < > 38*  --  74*  --  29  --  42*  LABPROT  --   < >  --   --  24.3* 20.7* 16.9*  --   --   INR  --   < >  --   --  2.14 1.76 1.36  --   --   CREATININE 1.00  --   --  1.28*  --   --  1.08*  --   --   TROPONINI  --   < > 0.16*  --  0.36*  --   --  0.39*  --   < > = values in this interval not displayed.  Estimated Creatinine Clearance: 45.6 mL/min (by C-G formula based on SCr of 1.08 mg/dL (H)).   Assessment: 75 yo female with new finding of bilateral PE on CT with high suspicion for HIT. Patient received heparin at previous admission (last 11/27) and has 4T score of 7. Patient received argatroban 1.2 mcg/kg/min (11/29). During PEA arrest, argatroban was held and TNKase was given. Vascular suggests restarting argatroban due to need for anticoagulation and no new bleeding for last 12 hours. Resume argatroban at 0.5 mcg/kg/min. APTT of 29 and platelets of 145. Patient is currently on pantoprazole gtt due to possible GIB.   Initial argatroban dose of 0.5 mcg/kg/min was subtherapeutic at aPTT level of 42. No problems with infusion and no new bleeding.   Goal of Therapy:  APTT 50-90 seconds Monitor platelets by anticoagulation protocol: YES  Plan:  Increase  argatroban to 0.6 mcg/kg/min, 20% increase APTT in 2 hours Daily CBC and aPTT Monitor for s/s bleeding F/U on HIT panel   Imagene Riches 03/18/2016,12:34 PM

## 2016-03-18 NOTE — Evaluation (Signed)
Clinical/Bedside Swallow Evaluation Patient Details  Name: Michele Thompson MRN: [id number] Date of Birth: 10-03-1940  Today's Date: 03/18/2016 Time: SLP Start Time (ACUTE ONLY): [id number] SLP Stop Time (ACUTE ONLY): 1435 SLP Time Calculation (min) (ACUTE ONLY): 20 min  Past Medical History:  Past Medical History:  Diagnosis Date  . Coronary artery disease 03/16/2016   non-obstructive noted on left heart cath  . Hypertension   . Thyroid disease    Past Surgical History:  Past Surgical History:  Procedure Laterality Date  . CARDIAC CATHETERIZATION N/A 03/15/2016   Procedure: Left Heart Cath and Coronary Angiography;  Surgeon: [name], MD;  Location: [facility];  Service: Cardiovascular;  Laterality: N/A;  . VAGINAL HYSTERECTOMY  1983   endometriosis   HPI:  75 y.o.femalerecently admitted for unstable angina with known past medical history significant for hypertension and hypothyroidism. During previous admission patient endorsed exertional dyspnea as well as the sensation of her throat tightening with exertion as well. Symptoms began to occur at rest which prompted her presentation. Found ot have bilateral, massive PE's, HIT with thrombosis. PEA arrest with cardiogaenic shock. Intubated from 11/29 to 11/30.    Assessment / Plan / Recommendation Clinical Impression  Pt seen for subjective swallow assessment following less than two days of intubation, exutbated this am. Pts vocal quality hoarse, she reports feeling like she would choke when given sips of water. SLP provided small controlled sips x3 with consistent hard cough indicative of sensed aspiration. Nectar and puree textures tolerated well with no coughing. Pt reports these "go down smoother." Presentation consistent with acute reversible dysphagia following intubation, likely to improve in the next 24-48 hours. Will start a nectar thick, clear liquid diet, pt may advance solids per MD, but remain on nectar thick liquids  until SLP can reassess tomorrow.     Aspiration Risk  Mild aspiration risk    Diet Recommendation Dysphagia 3 (Mech soft);Nectar-thick liquid   Liquid Administration via: Cup;Straw Medication Administration: Whole meds with puree Supervision: Patient able to self feed Compensations: Slow rate;Small sips/bites Postural Changes: Seated upright at 90 degrees    Other  Recommendations Oral Care Recommendations: Oral care BID Other Recommendations: Order thickener from pharmacy   Follow up Recommendations None      Frequency and Duration min 2x/week  1 week       Prognosis Prognosis for Safe Diet Advancement: Good      Swallow Study   General HPI: 75 y.o.femalerecently admitted for unstable angina with known past medical history significant for hypertension and hypothyroidism. During previous admission patient endorsed exertional dyspnea as well as the sensation of her throat tightening with exertion as well. Symptoms began to occur at rest which prompted her presentation. Found ot have bilateral, massive PE's, HIT with thrombosis. PEA arrest with cardiogaenic shock. Intubated from 11/29 to 11/30.  Type of Study: Bedside Swallow Evaluation Previous Swallow Assessment: see HPI Diet Prior to this Study: NPO Temperature Spikes Noted: No Respiratory Status: Nasal cannula History of Recent Intubation: Yes Length of Intubations (days): 2 days Date extubated: 03/18/16 Behavior/Cognition: Alert;Cooperative;Pleasant mood Oral Cavity Assessment: Within Functional Limits Oral Care Completed by SLP: No Oral Cavity - Dentition: Adequate natural dentition Vision: Functional for self-feeding Self-Feeding Abilities: Able to feed self Patient Positioning: Upright in bed Baseline Vocal Quality: Hoarse Volitional Cough: Strong Volitional Swallow: Able to elicit    Oral/Motor/Sensory Function Overall Oral Motor/Sensory Function: Within functional limits   Ice Chips Ice chips: Not tested    Thin  Liquid Thin Liquid: Impaired Presentation: Cup Pharyngeal  Phase Impairments: Throat Clearing - Immediate;Cough - Immediate    Nectar Thick Nectar Thick Liquid: Within functional limits Presentation: Cup;Straw   Honey Thick     Puree Puree: Within functional limits Presentation: Spoon   Solid   GO   Solid: Not tested       Herbie Baltimore, MA CCC-SLP [id number]  , Katherene Ponto 03/18/2016,2:47 PM

## 2016-03-18 NOTE — Progress Notes (Signed)
Planned to remove femoral central line during day shift but kept due to need for Levo today. Line not sutured since planned to remove, Elink notified to have someone come suture line.

## 2016-03-18 NOTE — Progress Notes (Signed)
Elink MD notified that R radial site still oozing, holding off on restarting argatroban for now.  Will update CCM at next site check.

## 2016-03-18 NOTE — Progress Notes (Signed)
Patient Name: Michele Thompson Date of Encounter: 03/18/2016  Primary Cardiologist: Dr. Delila Pereyra Problem List     Principal Problem:   PE (pulmonary thromboembolism) [facility]) Active Problems:   Hyperlipidemia   Hypertension   Elevated troponin   Hypothyroidism   Rectal bleeding   Thrombocytopenia (HCC)   Headache   Hypoxemia   Heparin induced thrombocytopenia (HCC)   Cardiogenic shock (HCC)   Cardiac arrest (HCC)   Anoxic encephalopathy (HCC)     Subjective   Extubated this AM.  Inpatient Medications    Scheduled Meds: . atorvastatin  80 mg Oral q1800  . chlorhexidine gluconate (MEDLINE KIT)  15 mL Mouth Rinse BID  . cholecalciferol  1,000 Units Oral Daily  . insulin aspart  2-6 Units Subcutaneous Q4H  . levothyroxine  100 mcg Oral QAC breakfast  . mouth rinse  15 mL Mouth Rinse QID  . potassium chloride (KCL MULTIRUN) 30 mEq in 265 mL IVPB  30 mEq Intravenous Once  . sodium chloride flush  3 mL Intravenous Q12H   Continuous Infusions: . argatroban 0.5 mcg/kg/min (03/18/16 1018)  . norepinephrine (LEVOPHED) Adult infusion    . pantoprozole (PROTONIX) infusion 8 mg/hr (03/17/16 2051)  . phenylephrine (NEO-SYNEPHRINE) Adult infusion Stopped (03/17/16 1323)   PRN Meds: acetaminophen **OR** acetaminophen, ondansetron **OR** ondansetron (ZOFRAN) IV, oxyCODONE-acetaminophen, zolpidem   Vital Signs    Vitals:   03/18/16 0757 03/18/16 0825 03/18/16 0832 03/18/16 0847  BP:  131/65 (!) 129/59 (!) 161/69  Pulse:  66 73 86  Resp:  (!) 35 14 (!) 23  Temp: 98.4 F (36.9 C)     TempSrc: Oral     SpO2:  98% 97% 96%  Weight:      Height:        Intake/Output Summary (Last 24 hours) at 03/18/16 1053 Last data filed at 03/18/16 1017  Gross per 24 hour  Intake          3440.14 ml  Output              625 ml  Net          2815.14 ml   Filed Weights   03/17/16 0216  Weight: 188 lb (85.3 kg)    Physical Exam    GEN: Well nourished, well developed, in no  acute distress. Appears weak  HEENT: Grossly normal.  Neck: Supple, no JVD, carotid bruits, or masses. Cardiac: RRR, difficult to assess edema Respiratory:  Respirations regular and unlabored, clear to auscultation bilaterally. GI: Soft, nontender, nondistended, BS + x 4. MS: no deformity or atrophy. Skin: warm and dry, no rash. Neuro:  Strength and sensation are intact. Psych: AAOx3.  Normal affect.  Labs    CBC  Recent Labs  03/17/16 0240  03/17/16 2015 03/18/16 0512  WBC 6.6  < > 17.5* 17.8*  NEUTROABS 3.9  --   --   --   HGB 13.9  < > 12.6 11.7*  HCT 40.7  < > 37.8 35.3*  MCV 92.3  < > 93.3 92.2  PLT 34*  < > 184 145*  < > = values in this interval not displayed. Basic Metabolic Panel  Recent Labs  03/17/16 1433 03/18/16 0512  NA 140 142  K 4.3 3.2*  CL 112* 115*  CO2 15* 20*  GLUCOSE 288* 108*  BUN 9 12  CREATININE 1.28* 1.08*  CALCIUM 6.6* 6.9*  MG 1.8 1.5*  PHOS 6.0* 2.5   Liver Function Tests No results  for input(s): AST, ALT, ALKPHOS, BILITOT, PROT, ALBUMIN in the last 72 hours. No results for input(s): LIPASE, AMYLASE in the last 72 hours. Cardiac Enzymes  Recent Labs  03/17/16 0534 03/17/16 1137 03/17/16 1612  TROPONINI 0.12* 0.16* 0.36*   BNP Invalid input(s): POCBNP D-Dimer  Recent Labs  03/17/16 0240 03/17/16 0546  DDIMER 3.56* 3.46*   Hemoglobin A1C No results for input(s): HGBA1C in the last 72 hours. Fasting Lipid Panel No results for input(s): CHOL, HDL, LDLCALC, TRIG, CHOLHDL, LDLDIRECT in the last 72 hours. Thyroid Function Tests No results for input(s): TSH, T4TOTAL, T3FREE, THYROIDAB in the last 72 hours.  Invalid input(s): FREET3  Telemetry    NSR - Personally Reviewed  ECG    NSR, diffuse ST depressions - Personally Reviewed  Radiology    Ct Head Wo Contrast  Result Date: 03/17/2016 CLINICAL DATA:  Headache.  Hypertension. EXAM: CT HEAD WITHOUT CONTRAST TECHNIQUE: Contiguous axial images were obtained from  the base of the skull through the vertex without intravenous contrast. COMPARISON:  None. FINDINGS: Brain: No evidence of acute infarction, hemorrhage, hydrocephalus, extra-axial collection or mass lesion/mass effect. Vascular: No hyperdense vessel or unexpected calcification. Skull: Normal. Negative for fracture or focal lesion. Sinuses/Orbits: No acute finding. Other: None. IMPRESSION: Negative noncontrast head CT. Electronically Signed   By: Earle Gell M.D.   On: 03/17/2016 07:17   Ct Angio Chest Pe W And/or Wo Contrast  Result Date: 03/17/2016 CLINICAL DATA:  Chronic shortness of breath. Elevated D-dimer. Patient on Estrogen patches. Initial encounter. EXAM: CT ANGIOGRAPHY CHEST WITH CONTRAST TECHNIQUE: Multidetector CT imaging of the chest was performed using the standard protocol during bolus administration of intravenous contrast. Multiplanar CT image reconstructions and MIPs were obtained to evaluate the vascular anatomy. CONTRAST:  80 mL of Isovue 370 IV contrast COMPARISON:  Chest radiograph performed 03/14/2016 FINDINGS: Cardiovascular: Bilateral pulmonary embolus is noted, extending from the main pulmonary arteries to all lobes of both lungs. The RV/LV ratio of 2.2 corresponds to significant right heart strain and at least submassive pulmonary embolus. Scattered calcification is noted along the thoracic aorta. The proximal great vessels are grossly unremarkable. Mediastinum/Nodes: The mediastinum is grossly unremarkable in appearance. No mediastinal lymphadenopathy is seen. No pericardial effusion is identified. The thyroid gland is diminutive and not well characterized. No axillary lymphadenopathy is seen. Lungs/Pleura: Mild patchy bilateral atelectasis is noted. No pleural effusion or pneumothorax is seen. No masses are identified. Upper Abdomen: The visualized portions of the liver and spleen are unremarkable. The visualized portions of the pancreas is within normal limits. Musculoskeletal: No  acute osseous abnormalities are identified. Osseous fusion is noted at T7-T8. The visualized musculature is unremarkable in appearance. Review of the MIP images confirms the above findings. IMPRESSION: 1. Bilateral pulmonary embolus, extending from the main pulmonary arteries to all lobes of both lungs. Positive for acute PE with CT evidence of right heart strain (RV/LV Ratio = 2.2) consistent with at least submassive (intermediate risk) PE. The presence of right heart strain has been associated with an increased risk of morbidity and mortality. Please activate Code PE by paging [phone]. 2. Mild patchy bilateral atelectasis noted; lungs otherwise clear. 3. Osseous fusion at T7-T8. Critical Value/emergent results were called by telephone at the time of interpretation on 03/17/2016 at 4:21 am to Dr. Pryor Curia, who verbally acknowledged these results. Electronically Signed   By: Garald Balding M.D.   On: 03/17/2016 04:29   Portable Chest Xray  Result Date: 03/18/2016 CLINICAL DATA:  Intubation .  EXAM: PORTABLE CHEST 1 VIEW COMPARISON:  03/17/2016 . FINDINGS: Endotracheal tube and NG tube in stable position. Bibasilar atelectasis. Follow-up chest x-rays to demonstrate clearing suggested. Heart size normal. No pleural effusion or pneumothorax. No acute bony abnormality. IMPRESSION: 1. Lines and tubes in stable position. 2. Bibasilar atelectasis. Follow-up chest x-rays demonstrate clearing suggested. Electronically Signed   By: Marcello Moores  Register   On: 03/18/2016 07:32   Dg Chest Port 1 View  Result Date: 03/17/2016 CLINICAL DATA:  Central line placement. EXAM: PORTABLE CHEST 1 VIEW COMPARISON:  CT 03/17/2016.  Chest x-ray 03/14/2016. FINDINGS: Endotracheal tube noted with tip projected 1.8 cm above the carina. Heart size stable. Low lung volumes. No focal infiltrate. No pleural effusion or pneumothorax. No acute bony abnormality. IMPRESSION: Endotracheal tube tip noted 1.8 cm above the carina. Low lung  volumes. No focal infiltrate Electronically Signed   By: [place]   On: 03/17/2016 13:23    Cardiac Studies   *bilateral LE DVT  Patient Profile     75 y/o with HIT.  Assessment & Plan    Now extubated.  COntinue argatroban.  Will need to transition to oral anticoagulation.    BP stable.    Son was not in the room but I will speak to him later today.  Signed, Larae Grooms, MD  03/18/2016, 10:53 AM

## 2016-03-18 NOTE — Consult Note (Signed)
[facility]  Subjective  - sedated on vent   Objective (!) 121/55 (!) 54 98.4 F (36.9 C) (Oral) (!) 35 95%  Intake/Output Summary (Last 24 hours) at 03/18/16 0802 Last data filed at 03/18/16 0640  Gross per 24 hour  Intake          3447.34 ml  Output              825 ml  Net          2622.34 ml   Right radial cath site no hematoma no pulsatile mass NG no longer bloody No significant extremity edema 4 mcg Levo Argatroban has been off for at least 12 hours  Assessment/Planning: Pt with right arm and leg DVT.  Has right femoral catheter in the leg of her DVT would try to find a different site  PE needs anticoagulation.  No further NG bleeding. No bleeding from radial cath for last 12 hrs.  Would suggest restarting argatroban  HIT pending   No further recommendations. Will sign off.    Ruta Hinds 03/18/2016 8:02 AM --  Laboratory Lab Results:  Recent Labs  03/17/16 2015 03/18/16 0512  WBC 17.5* 17.8*  HGB 12.6 11.7*  HCT 37.8 35.3*  PLT 184 145*   BMET  Recent Labs  03/17/16 1433 03/18/16 0512  NA 140 142  K 4.3 3.2*  CL 112* 115*  CO2 15* 20*  GLUCOSE 288* 108*  BUN 9 12  CREATININE 1.28* 1.08*  CALCIUM 6.6* 6.9*    COAG Lab Results  Component Value Date   INR 1.36 03/18/2016   INR 1.76 03/17/2016   INR 2.14 03/17/2016   No results found for: PTT

## 2016-03-18 NOTE — Progress Notes (Signed)
ANTICOAGULATION CONSULT NOTE - Follow Up Consult  Pharmacy Consult for Argatroban Indication: pulmonary embolus and R/O HIT  Allergies  Allergen Reactions  . Heparin     HIT panel pending    Labs:  Recent Labs  03/17/16 0240  03/17/16 1433 03/17/16 1612 03/17/16 2015 03/18/16 0512 03/18/16 1015  03/18/16 1402 03/18/16 1412 03/18/16 1804 03/18/16 2105  HGB 13.9  < > 12.3 9.8* 12.6 11.7*  --   --  10.5*  --   --   --   HCT 40.7  < > 37.5 29.5* 37.8 35.3*  --   --  31.4*  --   --   --   PLT 34*  < > 145* 144* 184 145*  --   --  PLATELET CLUMPS NOTED ON SMEAR, COUNT APPEARS DECREASED  --   --   --   APTT  --   < >  --  74*  --  29  --   < >  --  36 69* 51*  LABPROT  --   < >  --  24.3* 20.7* 16.9*  --   --   --   --   --   --   INR  --   < >  --  2.14 1.76 1.36  --   --   --   --   --   --   CREATININE 1.00  --  1.28*  --   --  1.08*  --   --   --   --   --   --   TROPONINI  --   < >  --  0.36*  --   --  0.39*  --   --  0.36*  --  0.26*  < > = values in this interval not displayed.  Estimated Creatinine Clearance: 45.6 mL/min (by C-G formula based on SCr of 1.08 mg/dL (H)).   Assessment: 75yo female with new PE & R/O HIT, and s/p TNKase on 11/29.  PTT trending down to low end of therapeutic range (51 sec) on Argatroban 0.78 mcg/kg/min.  Bruising is stable and no bleeding noted per d/w RN.    Goal of Therapy:  aPTT 50-90 seconds Monitor platelets by anticoagulation protocol: Yes   Plan:  Increase argatroban slightly to 0.86 mcg/kg/min to keep in therapeutic range with large clot burden F/u PTT in a.m. Watch for s/s of bleeding, worsening bruising  Sherlon Handing, PharmD, BCPS Clinical pharmacist, pager [phone] 03/18/2016 11:12 PM

## 2016-03-18 NOTE — Progress Notes (Signed)
ANTICOAGULATION CONSULT NOTE - Follow Up Consult  Pharmacy Consult for Argatroban Indication: new PE and suspected HIT  Allergies  Allergen Reactions  . Heparin     HIT panel pending    Patient Measurements: Height: 5\' 2"  (157.5 cm) Weight: 188 lb (85.3 kg) IBW/kg (Calculated) : 50.1   Vital Signs: Temp: 98.4 F (36.9 C) (11/30 0757) Temp Source: Oral (11/30 0757) BP: 131/65 (11/30 0825) Pulse Rate: 66 (11/30 0825)  Labs:  Recent Labs  03/17/16 0240 03/17/16 0534  03/17/16 1137 03/17/16 1433 03/17/16 1612 03/17/16 2015 03/18/16 0512  HGB 13.9 13.3  --  12.4 12.3 9.8* 12.6 11.7*  HCT 40.7 39.9  --  36.8 37.5 29.5* 37.8 35.3*  PLT 34* 151  < > 140* 145* 144* 184 145*  APTT  --  26  < > 38*  --  74*  --  29  LABPROT  --  13.6  < >  --   --  24.3* 20.7* 16.9*  INR  --  1.04  < >  --   --  2.14 1.76 1.36  CREATININE 1.00  --   --   --  1.28*  --   --  1.08*  TROPONINI  --  0.12*  --  0.16*  --  0.36*  --   --   < > = values in this interval not displayed.  Estimated Creatinine Clearance: 45.6 mL/min (by C-G formula based on SCr of 1.08 mg/dL (H)).   Assessment: 75 yo female with new finding of bilateral PE on CT with high suspicion for HIT. Patient received heparin at previous admission (last 11/27) and has 4T score of 7. Patient received argatroban 1.2 mcg/kg/min (11/29). During PEA arrest, argatroban was held and TNKase was given. Vascular suggests restarting argatroban due to need for anticoagulation and no new bleeding for last 12 hours. Resume argatroban at 0.5 mcg/kg/min. APTT of 29 and platelets of 145. Patient is currently on pantoprazole gtt due to possible GIB.   Goal of Therapy:  APTT 50-90 seconds Monitor platelets by anticoagulation protocol: YES  Plan:  Reinitiate argatroban to 0.5 mcg/kg/min APTT in 2 hours Daily CBC and aPTT Monitor for s/s bleeding F/U on HIT panel   Imagene Riches 03/18/2016,8:56 AM

## 2016-03-18 NOTE — Progress Notes (Signed)
PCCM INTERVAL PROGRESS NOTE  Oozing at radial site seems to have subsided, however, bruising now appears worse. Will hold off on resuming argatroban and let the day team re-assess.    Georgann Housekeeper, AGACNP-BC [facility] Pulmonology/Critical Care Pager [phone] or [phone]  03/18/2016 2:58 AM

## 2016-03-18 NOTE — Progress Notes (Signed)
ANTICOAGULATION CONSULT NOTE - Follow Up Consult  Pharmacy Consult for Argatroban Indication: new PE and suspected HIT  Allergies  Allergen Reactions  . Heparin     HIT panel pending    Patient Measurements: Height: 5\' 2"  (157.5 cm) Weight: 188 lb (85.3 kg) IBW/kg (Calculated) : 50.1   Vital Signs: Temp: 97.8 F (36.6 C) (11/30 1141) Temp Source: Oral (11/30 1141) BP: 122/54 (11/30 1400) Pulse Rate: 75 (11/30 1400)  Labs:  Recent Labs  03/17/16 0240  03/17/16 1137 03/17/16 1433 03/17/16 1612 03/17/16 2015 03/18/16 0512 03/18/16 1015 03/18/16 1120 03/18/16 1402 03/18/16 1412  HGB 13.9  < > 12.4 12.3 9.8* 12.6 11.7*  --   --  10.5*  --   HCT 40.7  < > 36.8 37.5 29.5* 37.8 35.3*  --   --  31.4*  --   PLT 34*  < > 140* 145* 144* 184 145*  --   --  PENDING  --   APTT  --   < > 38*  --  74*  --  29  --  42*  --  36  LABPROT  --   < >  --   --  24.3* 20.7* 16.9*  --   --   --   --   INR  --   < >  --   --  2.14 1.76 1.36  --   --   --   --   CREATININE 1.00  --   --  1.28*  --   --  1.08*  --   --   --   --   TROPONINI  --   < > 0.16*  --  0.36*  --   --  0.39*  --   --   --   < > = values in this interval not displayed.  Estimated Creatinine Clearance: 45.6 mL/min (by C-G formula based on SCr of 1.08 mg/dL (H)).   Assessment: 75 yo female with new finding of bilateral PE on CT with high suspicion for HIT. Patient received heparin at previous admission (last 11/27) and has 4T score of 7. Patient received argatroban 1.2 mcg/kg/min (11/29). During PEA arrest, argatroban was held and TNKase was given. Vascular suggests restarting argatroban due to need for anticoagulation and no new bleeding for last 12 hours. Resume argatroban at 0.5 mcg/kg/min. APTT of 29 and platelets of 145. Patient is currently on pantoprazole gtt due to possible GIB.    2h aPTT = 36 sec on argatroban dose of 0.6 mcg/kg/min . APTT is subtherapeutic and decreased despite increasing the argatroban  infusio rate.  No problems with infusion and no new bleeding per RN's report.  Still oozing at IV site but no more than before.   Goal of Therapy:  APTT 50-90 seconds Monitor platelets by anticoagulation protocol: YES  Plan:  Increase argatroban to 0.78 mcg/kg/min, 30% increase APTT in 2 hours Daily CBC and aPTT Monitor for s/s bleeding F/U on HIT panel   Nicole Cella, RPh Clinical Pharmacist Pager: [phone] 03/18/2016,2:52 PM

## 2016-03-18 NOTE — Procedures (Signed)
Extubation Procedure Note  Patient Details:   Name: Michele Thompson DOB: Oct 14, 1940 MRN: [id number]   Airway Documentation:     Evaluation  O2 sats: stable throughout Complications: No apparent complications Patient did tolerate procedure well. Bilateral Breath Sounds: Clear, Diminished   Yes   Positive cuff leak noted. Pt placed on nasal cannula 4 Lpm with humidity. No stridor noted.  Mingo Amber  03/18/2016, 9:02 AM

## 2016-03-18 NOTE — Progress Notes (Signed)
ANTICOAGULATION CONSULT NOTE - Follow Up Consult  Pharmacy Consult for Argatroban Indication: pulmonary embolus and R/O HIT  Allergies  Allergen Reactions  . Heparin     HIT panel pending    Labs:  Recent Labs  03/17/16 0240  03/17/16 1433 03/17/16 1612 03/17/16 2015 03/18/16 0512 03/18/16 1015 03/18/16 1120 03/18/16 1402 03/18/16 1412 03/18/16 1804  HGB 13.9  < > 12.3 9.8* 12.6 11.7*  --   --  10.5*  --   --   HCT 40.7  < > 37.5 29.5* 37.8 35.3*  --   --  31.4*  --   --   PLT 34*  < > 145* 144* 184 145*  --   --  PLATELET CLUMPS NOTED ON SMEAR, COUNT APPEARS DECREASED  --   --   APTT  --   < >  --  74*  --  29  --  42*  --  36 69*  LABPROT  --   < >  --  24.3* 20.7* 16.9*  --   --   --   --   --   INR  --   < >  --  2.14 1.76 1.36  --   --   --   --   --   CREATININE 1.00  --  1.28*  --   --  1.08*  --   --   --   --   --   TROPONINI  --   < >  --  0.36*  --   --  0.39*  --   --  0.36*  --   < > = values in this interval not displayed.  Estimated Creatinine Clearance: 45.6 mL/min (by C-G formula based on SCr of 1.08 mg/dL (H)).   Assessment: 75yo female with new PE & R/O HIT, and s/p TNKase on 11/29.  PTT is within goal on Argatroban 0.45mcg/kg/min.  Bruising is stable and no bleeding noted per d/w RN.    Goal of Therapy:  aPTT 50-90 seconds Monitor platelets by anticoagulation protocol: Yes   Plan:  Continue Argatroban at current rate Repeat aPTT now to verify therapeutic Watch for s/s of bleeding, worsening bruising   Gracy Bruins, PharmD [facility]

## 2016-03-18 NOTE — Progress Notes (Signed)
PULMONARY / CRITICAL CARE MEDICINE   Name: Michele Thompson MRN: [id number] DOB: 1941-02-06    ADMISSION DATE:  03/17/2016 CONSULTATION DATE:  03/17/2016  REFERRING MD:  Pryor Curia, D.O. / EDP  CHIEF COMPLAINT:  Pulmonary Embolism  Brief:  75 y.o. female recently admitted for unstable angina with known past medical history significant for hypertension and hypothyroidism. During previous admission patient endorsed exertional dyspnea as well as the sensation of her throat tightening with exertion as well. Symptoms began to occur at rest which prompted her presentation. Troponin I was slightly elevated on admission previously as well and patient underwent left heart catheterization with only nonobstructive coronary artery disease identified. Pulmonary embolism was considered but without risk factors imaging was not performed. Pain was thought to be secondary to vasospasm. Per documentation patient was able to ambulate without significant dyspnea on the day of discharge.   Documentation from the patient's emergency department physician this evening records that the patient has been complaining of worsening and intermittent episodes of shortness of breath for approximately 6 weeks. She has had associated nausea and diaphoresis with these episodes. Patient denied any associated chest pain, fever, cough, leg swelling, abdominal pain, vomiting, or diarrhea. Patient denied any history of pulmonary embolism or DVT. Patient was normotensive at presentation with a normal heart rate and saturating 96% on room air per documentation. PCCM was consulted given the concern for right heart strain and possible need for thrombolytics.   Patient reports ongoing dyspnea for approximately one year that has been more severe over the last 2 months and certainly over the last week. Last evening the patient noticed acute worsening in her shortness of breath with the same sensation as though she could not take a deep inspiratory  breath. Also note the patient complained of a headache which seems to be improving slightly. She did endorse nausea as well as diaphoresis and near-syncope with this episode. Denies any frank chest pain, pressure, or tightness. Does report some mild central abdominal tightness and pain. Patient does endorse some intermittent blood whenever she wipes after having a bowel movement which she attributes to a painful area near her rectum. No prior history of GI bleeding. Patient was on heparin infusion during last admission started on 11/26.  Imaging Dg Chest  03/17/2016 Endotracheal tube tip noted 1.8 cm above the carina. Low lung volumes. No focal infiltrate Electronically Signed   ByMarcello Moores  Register   On: 03/17/2016 13:23   03/18/2016 Bibasilar atelectasis. Follow-up chest x-rays demonstrate clearing suggested.Lines and tubes in stable position.  STUDIES:  LHC 03/16/16:  Nonobstructive CAD w/ mid-LAD irregularity. Normal LV function.  CTA 11/29: Bilateral pulmonary embolism extending from Main pulmonary artery to all lobes of both lungs. Positive for acute pulmonary embolism with RV/LV ratio 2.2.  Bedside Echo 11/29: no pericardial effusion, hyperdynamic LV function and a dilated hypocontractile RV   UE doppler 11/29: positive for subvalvular hyperacute platelet aggregation versus mobile deep vein thrombosis involving the right axillary vein. There is rouleaux flow in the right subclavian and brachial veins.  LE Doppler 11/29: The RLE is positive for acute, non-mobile, DVT involving the right common femoral, femoral, posterior tibial, and peroneal veins.   MICROBIOLOGY: MRSA PCR 11/26:  Negative  ANTIBIOTICS: None  LINES/TUBES: CVC Rt Femoral 11/29>> 2 PIV's 11/29>> ETT 11/29>> OGT 11/29>> Foley 11/29>>  SIGNIFICANT EVENTS: 11/26 - 11/28 - Admit w/ Unstable angina w/ LHC on 11/28  11/29 - Admit with bilateral massive PE's 11/29 S/S HIT placed on  argatroban , HIT panel  pending 11/29 CT head neg 11/29 PEA arrest with cardiogenic shock  Subjective:  Sleepy but arouses easily and follows command. Moves extremities. Denies pain  VITAL SIGNS: BP (!) 163/66   Pulse 83   Temp 97.9 F (36.6 C) (Oral)   Resp 16   Ht 5\' 2"  (1.575 m)   Wt 85.3 kg (188 lb)   SpO2 100%   BMI 34.39 kg/m   HEMODYNAMICS:    VENTILATOR SETTINGS: Vent Mode: PRVC FiO2 (%):  [50 %-100 %] 50 % Set Rate:  [24 bmp-35 bmp] 35 bmp Vt Set:  [400 mL] 400 mL PEEP:  [5 cmH20] 5 cmH20 Plateau Pressure:  [16 cmH20-22 cmH20] 18 cmH20  INTAKE / OUTPUT: I/O last 3 completed shifts: In: 3457.3 [I.V.:3357.3; IV Piggyback:100] Out: 1025 [Urine:850; Emesis/NG output:175]  PHYSICAL EXAMINATION: General:  Awake. Alert. No acute distress.  Integument:  Mild echymosis in her right forearm, right antecubital fossa and around PIV in left antecubital fossa HEENT:  ETT in places, MMM.   Cardiovascular:  Regular rate and rhythm. 1+ edema in UE's. No appreciable JVD.  Pulmonary:  ETT, rhonchi, comfortable on vent, air sounds bilaterally, no wheeze or crackles Abdomen: Soft. Normal bowel sounds. Nondistended. Grossly nontender. Musculoskeletal:  Normal bulk and tone.  Neurological: awake, follows command, responds to questions by nodding, moves exts  LABS:  BMET  Recent Labs Lab 03/17/16 0240 03/17/16 1433 03/18/16 0512  NA 141 140 142  K 3.5 4.3 3.2*  CL 113* 112* 115*  CO2 15* 15* 20*  BUN 12 9 12   CREATININE 1.00 1.28* 1.08*  GLUCOSE 117* 288* 108*   Electrolytes  Recent Labs Lab 03/17/16 0240 03/17/16 1433 03/18/16 0512  CALCIUM 9.0 6.6* 6.9*  MG  --  1.8 1.5*  PHOS  --  6.0* 2.5   CBC  Recent Labs Lab 03/17/16 1612 03/17/16 2015 03/18/16 0512  WBC 15.8* 17.5* 17.8*  HGB 9.8* 12.6 11.7*  HCT 29.5* 37.8 35.3*  PLT 144* 184 145*    Coag's  Recent Labs Lab 03/17/16 1137 03/17/16 1612 03/17/16 2015 03/18/16 0512  APTT 38* 74*  --  29  INR  --  2.14  1.76 1.36    Sepsis Markers No results for input(s): LATICACIDVEN, PROCALCITON, O2SATVEN in the last 168 hours.  ABG No results for input(s): PHART, PCO2ART, PO2ART in the last 168 hours.  Liver Enzymes No results for input(s): AST, ALT, ALKPHOS, BILITOT, ALBUMIN in the last 168 hours.  Cardiac Enzymes  Recent Labs Lab 03/17/16 0534 03/17/16 1137 03/17/16 1612  TROPONINI 0.12* 0.16* 0.36*    Glucose  Recent Labs Lab 03/17/16 0840 03/17/16 2352 03/18/16 0140 03/18/16 0402  GLUCAP 111* 124* 115* 122*    ASSESSMENT / PLAN:  PULMONARY A: PE with rt heart strain: likely due to HIT with thrombosis. LE doppler with DVT UE dopplers with DVT Intubated 11/29 due to PEA and respiratory failure P:   Extubate. Patient able to take deep breath and cough Resume argatroban infusion. HIT score high but HIT labs pending SLP eval PT eval  CARDIOVASCULAR A: NSTEMI: troponin elevation likely due to PE with right heart strain and PEA arrest with CPR on 11/29.  S/p TNKase Recent cath with non obst CAD. Elevated BNP Essential Hypertension Hyperlipidemia P:  On levophed gtt. Titrate for goal MAP >65 Need lasix when stable NS at 50 ml/hr Argatroban held overnight due to oozing at radial site overnight. Resume  Trend troponin Hold home BP  meds  Tele Follow Echo  RENAL A:   AKI: improving HypoK and Mg HypoCa P:   KCl 40 mEq x2 Mg 4 mg once Follow I & O BMP daily  GASTROINTESTINAL A: Possible GI Bleeding Nausea  P:   Protonix drip NPO for now Diet after swallow test  HEMATOLOGIC A:  PE likely due to HIT. 4T score 7. DIC panel neg except elevated D-dimer S/p TNKase LE doppler with DVT UE dopplers with DVT Mild Thrombocytopenia: stable Leucocytosis: likely stress Hgb stable P:  Appreciate VVS recs;  -Continue argatroban. Held overngiht due to oozing from radial site  -transition to heparin if HIT negative  -no role for venous thrombectomy or SVC  filter as risk of procedure far outweighs any perceived benefit. Peripheral smear for pathologist review. Follow HIT antibody.  Trending platelet count with CBC.  INFECTIOUS A:   Mild leukocytosis likely stress Afebrile P:   Trend CBC  ENDOCRINE A:   Hypothyroidism P:   Continue home synthroid  NEUROLOGIC A:   Headache Neg CT Head w/o contrast P:   RASS goal: 0 to 1 D/c fentanyl gtt  SBT this morning  FAMILY  - Updates: no family at bedside  - Inter-disciplinary family meet or Palliative Care meeting due by 03/26/2016  Wendee Beavers, MD.  03/18/16 7:16 AM PGY-2  Rush Farmer, M.D. [facility] Pulmonary/Critical Care Medicine. Pager: [phone]. After hours pager: [phone].

## 2016-03-19 DIAGNOSIS — J9601 Acute respiratory failure with hypoxia: Secondary | ICD-10-CM

## 2016-03-19 LAB — CBC
HCT: 29.7 % — ABNORMAL LOW (ref 36.0–46.0)
HCT: 26 % — ABNORMAL LOW (ref 36.0–46.0)
Hemoglobin: 9.7 g/dL — ABNORMAL LOW (ref 12.0–15.0)
Hemoglobin: 8.5 g/dL — ABNORMAL LOW (ref 12.0–15.0)
MCH: 30.8 pg (ref 26.0–34.0)
MCH: 30.5 pg (ref 26.0–34.0)
MCHC: 32.7 g/dL (ref 30.0–36.0)
MCHC: 32.7 g/dL (ref 30.0–36.0)
MCV: 94.3 fL (ref 78.0–100.0)
MCV: 93.2 fL (ref 78.0–100.0)
Platelets: UNDETERMINED 10*3/uL (ref 150–400)
Platelets: 109 10*3/uL — ABNORMAL LOW (ref 150–400)
RBC: 3.15 MIL/uL — ABNORMAL LOW (ref 3.87–5.11)
RBC: 2.79 MIL/uL — ABNORMAL LOW (ref 3.87–5.11)
RDW: 14.1 % (ref 11.5–15.5)
RDW: 13.7 % (ref 11.5–15.5)
WBC: 11.6 10*3/uL — ABNORMAL HIGH (ref 4.0–10.5)
WBC: 13 10*3/uL — ABNORMAL HIGH (ref 4.0–10.5)

## 2016-03-19 LAB — GLUCOSE, CAPILLARY
Glucose-Capillary: 86 mg/dL (ref 65–99)
Glucose-Capillary: 95 mg/dL (ref 65–99)
Glucose-Capillary: 103 mg/dL — ABNORMAL HIGH (ref 65–99)
Glucose-Capillary: 130 mg/dL — ABNORMAL HIGH (ref 65–99)
Glucose-Capillary: 96 mg/dL (ref 65–99)

## 2016-03-19 LAB — BASIC METABOLIC PANEL WITH GFR
Anion gap: 7 (ref 5–15)
BUN: 12 mg/dL (ref 6–20)
CO2: 19 mmol/L — ABNORMAL LOW (ref 22–32)
Calcium: 7.3 mg/dL — ABNORMAL LOW (ref 8.9–10.3)
Chloride: 115 mmol/L — ABNORMAL HIGH (ref 101–111)
Creatinine, Ser: 1.06 mg/dL — ABNORMAL HIGH (ref 0.44–1.00)
GFR calc Af Amer: 58 mL/min — ABNORMAL LOW
GFR calc non Af Amer: 50 mL/min — ABNORMAL LOW
Glucose, Bld: 99 mg/dL (ref 65–99)
Potassium: 3.6 mmol/L (ref 3.5–5.1)
Sodium: 141 mmol/L (ref 135–145)

## 2016-03-19 LAB — APTT
aPTT: 38 seconds — ABNORMAL HIGH (ref 24–36)
aPTT: 39 seconds — ABNORMAL HIGH (ref 24–36)

## 2016-03-19 LAB — TROPONIN I
Troponin I: 0.22 ng/mL (ref <0.03)
Troponin I: 0.21 ng/mL (ref <0.03)
Troponin I: 0.18 ng/mL (ref <0.03)
Troponin I: 0.16 ng/mL (ref <0.03)

## 2016-03-19 LAB — PHOSPHORUS: Phosphorus: 1.8 mg/dL — ABNORMAL LOW (ref 2.5–4.6)

## 2016-03-19 LAB — MAGNESIUM: Magnesium: 2.5 mg/dL — ABNORMAL HIGH (ref 1.7–2.4)

## 2016-03-19 MED ORDER — AMLODIPINE BESYLATE 5 MG PO TABS
5.0000 mg | ORAL_TABLET | Freq: Every day | ORAL | Status: DC
  Administered 2016-03-19 – 2016-03-21 (×3): 5 mg via ORAL
  Filled 2016-03-19 (×3): qty 1

## 2016-03-19 MED ORDER — FUROSEMIDE 10 MG/ML IJ SOLN
40.0000 mg | Freq: Three times a day (TID) | INTRAMUSCULAR | Status: AC
  Administered 2016-03-19 (×2): 40 mg via INTRAVENOUS
  Filled 2016-03-19 (×3): qty 4

## 2016-03-19 MED ORDER — APIXABAN 5 MG PO TABS
10.0000 mg | ORAL_TABLET | Freq: Two times a day (BID) | ORAL | Status: DC
  Administered 2016-03-19 – 2016-03-20 (×3): 10 mg via ORAL
  Filled 2016-03-19 (×4): qty 2

## 2016-03-19 MED ORDER — APIXABAN 5 MG PO TABS: 5.0000 mg | ORAL_TABLET | Freq: Two times a day (BID) | ORAL | Status: DC

## 2016-03-19 MED ORDER — POTASSIUM PHOSPHATES 15 MMOLE/5ML IV SOLN
30.0000 mmol | Freq: Once | INTRAVENOUS | Status: AC
  Administered 2016-03-19: 30 mmol via INTRAVENOUS
  Filled 2016-03-19: qty 10

## 2016-03-19 MED ORDER — PHENOL 1.4 % MT LIQD
1.0000 | OROMUCOSAL | Status: DC | PRN
  Filled 2016-03-19 (×2): qty 177

## 2016-03-19 NOTE — Care Management (Addendum)
Benefit Check for Eliquis ELIQUIS 2.5 MG BID  30/60 TAB   COVER- YES  CO-PAY- $ 215.00    (   Q/L 2 PILL PER DAY )  TIER- 3 DRUG  PRIOR APPROVAL - YES # [phone]  PHARMACY : [facility]   ELIQUIS 5 MG BID 30./60 TAX  COVER- YES  CO-PAY- $ 215.00  TIER- 3 DRUG  PRIOR APPROVAL -YES # [phone]         Benefit check for Xarelto submitted 03/19/2016 2:57 XARELTO 15 MG DAILY   COVER- YES  CO-PAY- $ 45.00  Q/L 2 PILLS PER DAY  TIER- 3 DRUG  PRIOR APPROVAL- YES # [id number]   XARELTO 20 MG DAILY   COVER- YES  CO-PAY $ 45.00  Q/L OF 1 PILL PER DAY  TIER- 3 DRUG  PRIOR APPROVAL - YES # [phone]   PHARMACY : [facility]   CM spoke with pharmacy - pharmacy requested Xarelto benefit check and will follow up with attending - requested that CM provide results in note and pharmacy will provide free 30 day card and copay amount directly to pt  once decision is made regarding which med pt will discharge home with.  CM also placed note on treatment sticky notes

## 2016-03-19 NOTE — Progress Notes (Signed)
Patient Name: Michele Thompson Date of Encounter: 03/19/2016  Primary Cardiologist: Dr. Delila Pereyra Problem List     Principal Problem:   PE (pulmonary thromboembolism) [facility]) Active Problems:   Hyperlipidemia   Hypertension   Elevated troponin   Hypothyroidism   Rectal bleeding   Thrombocytopenia (HCC)   Hypoxemia   Heparin induced thrombocytopenia (HCC)   Cardiogenic shock (HCC)   Cardiac arrest (HCC)   Anoxic encephalopathy (HCC)     Subjective        Inpatient Medications    Scheduled Meds: . amLODipine  5 mg Oral Daily  . apixaban  10 mg Oral BID   Followed by  . [START ON 03/26/2016] apixaban  5 mg Oral BID  . atorvastatin  80 mg Oral q1800  . cholecalciferol  1,000 Units Oral Daily  . furosemide  40 mg Intravenous Q8H  . insulin aspart  2-6 Units Subcutaneous Q4H  . levothyroxine  100 mcg Oral QAC breakfast  . potassium phosphate IVPB (mmol)  30 mmol Intravenous Once  . sodium chloride flush  3 mL Intravenous Q12H   Continuous Infusions: . norepinephrine (LEVOPHED) Adult infusion Stopped (03/19/16 0815)   PRN Meds: acetaminophen **OR** acetaminophen, ondansetron **OR** ondansetron (ZOFRAN) IV, oxyCODONE-acetaminophen, RESOURCE THICKENUP CLEAR, zolpidem   Vital Signs    Vitals:   03/19/16 1000 03/19/16 1100 03/19/16 1112 03/19/16 1241  BP: (!) 176/56 (!) 178/64  121/71  Pulse: 73 76    Resp: 15 15    Temp:   98.7 F (37.1 C)   TempSrc:   Oral   SpO2: 96% 94%    Weight:      Height:        Intake/Output Summary (Last 24 hours) at 03/19/16 1346 Last data filed at 03/19/16 1300  Gross per 24 hour  Intake          1281.78 ml  Output              595 ml  Net           686.78 ml   Filed Weights   03/17/16 0216  Weight: 188 lb (85.3 kg)    Physical Exam    GEN: Well nourished, well developed, in no acute distress. Appears weak  HEENT: Grossly normal.  Neck: Supple, no JVD, carotid bruits, or masses. Cardiac: RRR, trace to 1+ LE  edema Respiratory:  Respirations regular and unlabored, clear to auscultation bilaterally. GI: Soft, nontender, nondistended, BS + x 4. MS: no deformity or atrophy. Skin: warm and dry, no rash. Neuro:  Strength and sensation are intact. Psych: AAOx3.  Normal affect.  Labs    CBC  Recent Labs  03/17/16 0240  03/19/16 0520 03/19/16 0700  WBC 6.6  < > 11.6* 13.0*  NEUTROABS 3.9  --   --   --   HGB 13.9  < > 9.7* 8.5*  HCT 40.7  < > 29.7* 26.0*  MCV 92.3  < > 94.3 93.2  PLT 34*  < > PLATELET CLUMPS NOTED ON SMEAR, UNABLE TO ESTIMATE 109*  < > = values in this interval not displayed. Basic Metabolic Panel  Recent Labs  03/18/16 0512 03/19/16 0520  NA 142 141  K 3.2* 3.6  CL 115* 115*  CO2 20* 19*  GLUCOSE 108* 99  BUN 12 12  CREATININE 1.08* 1.06*  CALCIUM 6.9* 7.3*  MG 1.5* 2.5*  PHOS 2.5 1.8*   Liver Function Tests  Recent Labs  03/18/16 1015  ALBUMIN 2.5*   No results for input(s): LIPASE, AMYLASE in the last 72 hours. Cardiac Enzymes  Recent Labs  03/19/16 0345 03/19/16 0520 03/19/16 0905  TROPONINI 0.22* 0.21* 0.18*   BNP Invalid input(s): POCBNP D-Dimer  Recent Labs  03/17/16 0240 03/17/16 0546  DDIMER 3.56* 3.46*     Telemetry    NSR, transient junctional rythm - Personally Reviewed  ECG    NSR, diffuse ST depressions - Personally Reviewed  Radiology    Portable Chest Xray  Result Date: 03/18/2016 CLINICAL DATA:  Intubation . EXAM: PORTABLE CHEST 1 VIEW COMPARISON:  03/17/2016 . FINDINGS: Endotracheal tube and NG tube in stable position. Bibasilar atelectasis. Follow-up chest x-rays to demonstrate clearing suggested. Heart size normal. No pleural effusion or pneumothorax. No acute bony abnormality. IMPRESSION: 1. Lines and tubes in stable position. 2. Bibasilar atelectasis. Follow-up chest x-rays demonstrate clearing suggested. Electronically Signed   By: Marcello Moores  Register   On: 03/18/2016 07:32    Cardiac Studies   *bilateral  LE DVT  Patient Profile     75 y.o. female with a history of HTN, HLD, treated hypothyroidism, estrogen therapy, admitted 11/26-11/28/17 with chest pain. Shewas found to have NSTEMI with elevated troponin 0.57. On 03/15/16 she underwent LHC with Dr. Burt Knack that showed mild non-obstructive CAD with widely patent vessels, and normal LV function. No clear etiology was noted for her cause of angina. She was started on amlodipine in the setting of possible microvascular disease. Labs were noted to be stable post procedure and cath with non obst CAD. She then presented to [facility] 03/17/16 for SOB and rectal bleeding. Found to have a submassive PE with right heart strain. Then had PEA arrest with possible ST elevation and cardiology consulted. PE felt to be 2/2 heparin-induced thrombocytopenia with thrombosis.  Assessment & Plan    Now extubated. Up in chair eating lunch. Eliquis started.  She is on Lasix 40 mg IV Q8. She is diuresing per Therapist, sports.  Amlodipine resumed, not Lisinopril. I would avoid beta blocker for now with transient junctional rhythm. She had transient hypotension that required IV pressors yesterday afternoon, stable now.   Signed, Kerin Ransom, PA-C  03/19/2016, 1:46 PM   I have examined the patient and reviewed assessment and plan and discussed with patient.  Agree with above as stated.  Doing well this afternoon. Eating solid food.  Diuresing well.  Bruises are spreading out on arm but I do not think she is actively bleeding.  Bruises are soft.  No evidence of active bleeding.    Larae Grooms

## 2016-03-19 NOTE — Care Management Note (Addendum)
Case Management Note  Patient Details  Name: LATECIA IPPOLITO MRN: [id number] Date of Birth: Jan 23, 1941  Subjective/Objective:    Pt admitted with PE                Action/Plan:  PTA independent from home alone.  Pt states she plans on staying with children in [place] post discharge but she is open to SNF if recommended.  PT has been ordered - not yet assessed pt,  CM will continue to follow for discharge needs   Expected Discharge Date:                  Expected Discharge Plan:     In-House Referral:     Discharge planning Services  CM Consult  Post Acute Care Choice:    Choice offered to:     DME Arranged:    DME Agency:     HH Arranged:    [place] Agency:     Status of Service:  In process, will continue to follow  If discussed at Long Length of Stay Meetings, dates discussed:    Additional Comments: CM submitted benefit check for Eliquis - new med. Maryclare Labrador, RN 03/19/2016, 11:36 AM

## 2016-03-19 NOTE — Progress Notes (Signed)
PULMONARY / CRITICAL CARE MEDICINE   Name: Michele Thompson MRN: [id number] DOB: [birth year]    ADMISSION DATE:  03/17/2016 CONSULTATION DATE:  03/17/2016  REFERRING MD:  Pryor Curia, D.O. / EDP  CHIEF COMPLAINT:  Pulmonary Embolism  Brief:  75 y.o. female recently admitted for unstable angina with known past medical history significant for hypertension and hypothyroidism. During previous admission patient endorsed exertional dyspnea as well as the sensation of her throat tightening with exertion as well. Symptoms began to occur at rest which prompted her presentation. Troponin I was slightly elevated on admission previously as well and patient underwent left heart catheterization with only nonobstructive coronary artery disease identified. Pulmonary embolism was considered but without risk factors imaging was not performed. Pain was thought to be secondary to vasospasm. Per documentation patient was able to ambulate without significant dyspnea on the day of discharge.   Documentation from the patient's emergency department physician this evening records that the patient has been complaining of worsening and intermittent episodes of shortness of breath for approximately 6 weeks. She has had associated nausea and diaphoresis with these episodes. Patient denied any associated chest pain, fever, cough, leg swelling, abdominal pain, vomiting, or diarrhea. Patient denied any history of pulmonary embolism or DVT. Patient was normotensive at presentation with a normal heart rate and saturating 96% on room air per documentation. PCCM was consulted given the concern for right heart strain and possible need for thrombolytics.   Patient reports ongoing dyspnea for approximately one year that has been more severe over the last 2 months and certainly over the last week. Last evening the patient noticed acute worsening in her shortness of breath with the same sensation as though she could not take a deep inspiratory  breath. Also note the patient complained of a headache which seems to be improving slightly. She did endorse nausea as well as diaphoresis and near-syncope with this episode. Denies any frank chest pain, pressure, or tightness. Does report some mild central abdominal tightness and pain. Patient does endorse some intermittent blood whenever she wipes after having a bowel movement which she attributes to a painful area near her rectum. No prior history of GI bleeding. Patient was on heparin infusion during last admission started on 11/26.  Imaging Dg Chest  03/17/2016 Endotracheal tube tip noted 1.8 cm above the carina. Low lung volumes. No focal infiltrate Electronically Signed   ByMarcello Moores  Register   On: 03/17/2016 13:23   03/18/2016 Bibasilar atelectasis. Follow-up chest x-rays demonstrate clearing suggested.Lines and tubes in stable position.  STUDIES:  LHC 03/16/16:  Nonobstructive CAD w/ mid-LAD irregularity. Normal LV function.  CTA 11/29: Bilateral pulmonary embolism extending from Main pulmonary artery to all lobes of both lungs. Positive for acute pulmonary embolism with RV/LV ratio 2.2.  Bedside Echo 11/29: no pericardial effusion, hyperdynamic LV function and a dilated hypocontractile RV   UE doppler 11/29: positive for subvalvular hyperacute platelet aggregation versus mobile deep vein thrombosis involving the right axillary vein. There is rouleaux flow in the right subclavian and brachial veins.  LE Doppler 11/29: The RLE is positive for acute, non-mobile, DVT involving the right common femoral, femoral, posterior tibial, and peroneal veins.   MICROBIOLOGY: MRSA PCR 11/26:  Negative  ANTIBIOTICS: None  LINES/TUBES: CVC Rt Femoral 11/29>>12/1 2 PIV's 11/29>> ETT 11/29>>11/30 OGT 11/29>>11/30 Foley 11/29>>  SIGNIFICANT EVENTS: 11/26 - 11/28 - Admit w/ Unstable angina w/ LHC on 11/28  11/29 - Admit with bilateral massive PE's 11/29 S/S HIT placed on  argatroban , HIT panel  pending 11/29 CT head neg 11/29 PEA arrest with cardiogenic shock  Subjective:  Awake and interactive this AM, moving all ext to commands and asking questions  VITAL SIGNS: BP (!) 158/53 (BP Location: Right Leg)   Pulse 73   Temp 99 F (37.2 C) (Oral)   Resp 19   Ht 5\' 2"  (1.575 m)   Wt 85.3 kg (188 lb)   SpO2 (!) 89%   BMI 34.39 kg/m   HEMODYNAMICS:    VENTILATOR SETTINGS:    INTAKE / OUTPUT: I/O last 3 completed shifts: In: 2334 [I.V.:1969; IV Piggyback:365] Out: [id number] [Urine:1245; Emesis/NG output:175]  PHYSICAL EXAMINATION: General:  Awake. Alert. No acute distress.  Integument:  Mild echymosis in her right forearm, right antecubital fossa and around PIV in left antecubital fossa HEENT:  [place]/AT, PERRL, EOM-I and MMM Cardiovascular:  Regular rate and rhythm. 1+ edema in UE's.  No appreciable JVD.  Pulmonary:  [place], comfortable on 5L [place] Abdomen: Soft. Normal bowel sounds. Nondistended. Grossly nontender. Musculoskeletal:  Normal bulk and tone.  Neurological: awake, follows command, responds to questions by nodding, moves exts  LABS:  BMET  Recent Labs Lab 03/17/16 1433 03/18/16 0512 03/19/16 0520  NA 140 142 141  K 4.3 3.2* 3.6  CL 112* 115* 115*  CO2 15* 20* 19*  BUN 9 12 12   CREATININE 1.28* 1.08* 1.06*  GLUCOSE 288* 108* 99   Electrolytes  Recent Labs Lab 03/17/16 1433 03/18/16 0512 03/19/16 0520  CALCIUM 6.6* 6.9* 7.3*  MG 1.8 1.5* 2.5*  PHOS 6.0* 2.5 1.8*   CBC  Recent Labs Lab 03/18/16 1402 03/19/16 0520 03/19/16 0700  WBC 15.3* 11.6* 13.0*  HGB 10.5* 9.7* 8.5*  HCT 31.4* 29.7* 26.0*  PLT PLATELET CLUMPS NOTED ON SMEAR, COUNT APPEARS DECREASED PLATELET CLUMPS NOTED ON SMEAR, UNABLE TO ESTIMATE 109*    Coag's  Recent Labs Lab 03/17/16 1612 03/17/16 2015 03/18/16 0512  03/18/16 2105 03/19/16 0700 03/19/16 0946  APTT 74*  --  29  < > 51* 38* 39*  INR 2.14 1.76 1.36  --   --   --   --   < > = values in this interval not  displayed.  Sepsis Markers No results for input(s): LATICACIDVEN, PROCALCITON, O2SATVEN in the last 168 hours.  ABG No results for input(s): PHART, PCO2ART, PO2ART in the last 168 hours.  Liver Enzymes  Recent Labs Lab 03/18/16 1015  ALBUMIN 2.5*    Cardiac Enzymes  Recent Labs Lab 03/19/16 0345 03/19/16 0520 03/19/16 0905  TROPONINI 0.22* 0.21* 0.18*   Glucose  Recent Labs Lab 03/18/16 1140 03/18/16 1507 03/18/16 1934 03/18/16 2311 03/19/16 0359 03/19/16 0733  GLUCAP 106* 90 110* 93 86 95   I reviewed CT of the chest myself, multiple PE noted.  ASSESSMENT / PLAN:  PULMONARY A: PE with rt heart strain: likely due to HIT with thrombosis. LE doppler with DVT UE dopplers with DVT Intubated 11/29 due to PEA and respiratory failure P:   Titrate O2 for sat of 88-92% Argatroban infusion. HIT score high but HIT labs pending NOAC per pharmacy PT eval OOB to chair.  CARDIOVASCULAR A: NSTEMI: troponin elevation likely due to PE with right heart strain and PEA arrest with CPR on 11/29.  S/p TNKase Recent cath with non obst CAD. Elevated BNP Essential Hypertension Hyperlipidemia P:  D/C levophed Lasix 40 mg IV q8 x2 doses KVO IVF Argatroban NOAC per pharmacy for PE Norvasc 5 mg PO with  holding parameters Tele Rest per cards  RENAL A:   AKI: improving HypoK and Mg HypoCa P:   K3PO4 30 mmol IV x1 Follow I & O BMP daily KVO IVF Lasix 40 mg IV q8 x2 doses  GASTROINTESTINAL A: Possible GI Bleeding Nausea  P:   D/C protonix drip Heart healthy diet  HEMATOLOGIC A:  PE likely due to HIT. S/p TNKase LE doppler with DVT UE dopplers with DVT Mild Thrombocytopenia: stable Leucocytosis: likely stress Hgb stable P:  Appreciate VVS recs;  - Continue argatroban.   - NOAC per pharmacy  - No role for venous thrombectomy or SVC filter as risk of procedure far outweighs any perceived benefit per vascular surgery. Peripheral smear for  pathologist review. Follow HIT antibody.  Trending platelet count with CBC.  INFECTIOUS A:   Mild leukocytosis likely stress Afebrile P:   Trend CBC.  ENDOCRINE A:   Hypothyroidism P:   Continue home synthroid  NEUROLOGIC A:   Headache Neg CT Head w/o contrast P:   RASS goal: 0 to 1 D/C fentanyl gtt  D/C versed  FAMILY  - Updates: Family updated at length bedside, transfer to tele and to [facility] service with PCCM off 12/2.  - Inter-disciplinary family meet or Palliative Care meeting due by 03/26/2016  Discussed with TRH-MD.  Rush Farmer, M.D. [facility] Pulmonary/Critical Care Medicine. Pager: [phone]. After hours pager: [phone].

## 2016-03-19 NOTE — Progress Notes (Signed)
PT Cancellation Note  Patient Details Name: Michele Thompson MRN: [id number] DOB: [birth year]   Cancelled Treatment:    Reason Eval/Treat Not Completed: Medical issues which prohibited therapy (pt with argatroban not currently in goal range and will await corrected value for mobility)    B  03/19/2016, 10:42 AM  Elwyn Reach, [place]

## 2016-03-19 NOTE — Progress Notes (Signed)
ANTICOAGULATION CONSULT NOTE - Follow Up Consult  Pharmacy Consult for Argatroban Indication: pulmonary embolus and R/O HIT  Allergies  Allergen Reactions  . Heparin     HIT panel pending    Labs:  Recent Labs  03/17/16 1433 03/17/16 1612 03/17/16 2015 03/18/16 0512  03/18/16 1402  03/18/16 1804 03/18/16 2105 03/19/16 0345 03/19/16 0520 03/19/16 0700  HGB 12.3 9.8* 12.6 11.7*  --  10.5*  --   --   --   --  9.7*  --   HCT 37.5 29.5* 37.8 35.3*  --  31.4*  --   --   --   --  29.7*  --   PLT 145* 144* 184 145*  --  PLATELET CLUMPS NOTED ON SMEAR, COUNT APPEARS DECREASED  --   --   --   --  PLATELET CLUMPS NOTED ON SMEAR, UNABLE TO ESTIMATE  --   APTT  --  74*  --  29  < >  --   < > 69* 51*  --   --  38*  LABPROT  --  24.3* 20.7* 16.9*  --   --   --   --   --   --   --   --   INR  --  2.14 1.76 1.36  --   --   --   --   --   --   --   --   CREATININE 1.28*  --   --  1.08*  --   --   --   --   --   --  1.06*  --   TROPONINI  --  0.36*  --   --   < >  --   < >  --  0.26* 0.22* 0.21*  --   < > = values in this interval not displayed.  Estimated Creatinine Clearance: 46.5 mL/min (by C-G formula based on SCr of 1.06 mg/dL (H)).   Assessment: 75yo female with new PE & R/O HIT, and s/p TNKase on 11/29.  PTT trending down and is below goal despite rate increases. No new bleeding noted.    Goal of Therapy:  aPTT 50-90 seconds Monitor platelets by anticoagulation protocol: Yes   Plan:  Increase argatroban to 1.1 mcg/kg/min (~30% increase) Check a 2 hr aPTT Daily aPTT and CBC  Salome Arnt, PharmD, BCPS Pager # [phone] 03/19/2016 8:42 AM

## 2016-03-19 NOTE — Progress Notes (Signed)
eLink Physician-Brief Progress Note Patient Name: Michele Thompson DOB: 02-11-1941 MRN: [id number]   Date of Service  03/19/2016  HPI/Events of Note  Camera check on the patient. She has been extubated and doing well. Talking to her family. Not in distress. She wants to eat ice cream.  Blood pressure 126/60, heart rate 70s, respiratory rate 16, sats 98% on nasal cannula.   eICU Interventions  Cont to observe.      Intervention Category Evaluation Type: Other  [place] 03/19/2016, 4:49 PM

## 2016-03-19 NOTE — Progress Notes (Signed)
Speech Language Pathology Treatment: Dysphagia  Patient Details Name: Michele Thompson MRN: [id number] DOB: 1940-07-05 Today's Date: 03/19/2016 Time: [id number] SLP Time Calculation (min) (ACUTE ONLY): 11 min  Assessment / Plan / Recommendation Clinical Impression  Pt demonstrates ongoing improvement, no signs of aspiration with small sips of thin liquids or nectar today, vocal quality improving. Recommend upgrade to regular solids and thin liquids with careful precautions. Pt is observant of signs of aspiration. If pt senses aspirate and begins coughing, can change back to nectar or attempt a chin tuck, which SLP trialed with pt with no increase in signs of aspiration.    HPI HPI: 75 y.o.femalerecently admitted for unstable angina with known past medical history significant for hypertension and hypothyroidism. During previous admission patient endorsed exertional dyspnea as well as the sensation of her throat tightening with exertion as well. Symptoms began to occur at rest which prompted her presentation. Found ot have bilateral, massive PE's, HIT with thrombosis. PEA arrest with cardiogaenic shock. Intubated from 11/29 to 11/30.       SLP Plan  Continue with current plan of care     Recommendations  Diet recommendations: Regular;Thin liquid Liquids provided via: Straw;Cup Medication Administration: Whole meds with liquid Supervision: Staff to assist with self feeding Compensations: Slow rate;Small sips/bites;Chin tuck (ok to try chin tuck if still coughing) Postural Changes and/or Swallow Maneuvers: Seated upright 90 degrees                Plan: Continue with current plan of care       GO               [name], MA CCC-SLP [id number]  Michele Thompson 03/19/2016, 11:58 AM

## 2016-03-20 ENCOUNTER — Other Ambulatory Visit (HOSPITAL_COMMUNITY): Payer: Self-pay

## 2016-03-20 LAB — BASIC METABOLIC PANEL
Anion gap: 7 (ref 5–15)
BUN: 10 mg/dL (ref 6–20)
CO2: 24 mmol/L (ref 22–32)
Calcium: 7.5 mg/dL — ABNORMAL LOW (ref 8.9–10.3)
Chloride: 108 mmol/L (ref 101–111)
Creatinine, Ser: 0.99 mg/dL (ref 0.44–1.00)
GFR calc Af Amer: >60 mL/min (ref >60)
GFR calc non Af Amer: 54 mL/min — ABNORMAL LOW (ref >60)
Glucose, Bld: 96 mg/dL (ref 65–99)
Potassium: 3 mmol/L — ABNORMAL LOW (ref 3.5–5.1)
Sodium: 139 mmol/L (ref 135–145)

## 2016-03-20 LAB — TROPONIN I
Troponin I: 0.26 ng/mL (ref <0.03)
Troponin I: 0.19 ng/mL (ref <0.03)
Troponin I: 0.11 ng/mL (ref <0.03)
Troponin I: 0.1 ng/mL (ref <0.03)
Troponin I: 0.09 ng/mL (ref <0.03)

## 2016-03-20 LAB — CBC
HCT: 31.1 % — ABNORMAL LOW (ref 36.0–46.0)
Hemoglobin: 10.4 g/dL — ABNORMAL LOW (ref 12.0–15.0)
MCH: 30.7 pg (ref 26.0–34.0)
MCHC: 33.4 g/dL (ref 30.0–36.0)
MCV: 91.7 fL (ref 78.0–100.0)
Platelets: 116 10*3/uL — ABNORMAL LOW (ref 150–400)
RBC: 3.39 MIL/uL — ABNORMAL LOW (ref 3.87–5.11)
RDW: 13 % (ref 11.5–15.5)
WBC: 9.4 10*3/uL (ref 4.0–10.5)

## 2016-03-20 LAB — GLUCOSE, CAPILLARY
Glucose-Capillary: 102 mg/dL — ABNORMAL HIGH (ref 65–99)
Glucose-Capillary: 106 mg/dL — ABNORMAL HIGH (ref 65–99)
Glucose-Capillary: 92 mg/dL (ref 65–99)

## 2016-03-20 LAB — PHOSPHORUS: Phosphorus: 2.6 mg/dL (ref 2.5–4.6)

## 2016-03-20 LAB — MAGNESIUM: Magnesium: 1.8 mg/dL (ref 1.7–2.4)

## 2016-03-20 MED ORDER — POLYETHYLENE GLYCOL 3350 17 G PO PACK
17.0000 g | PACK | Freq: Two times a day (BID) | ORAL | Status: DC
  Administered 2016-03-20: 17 g via ORAL
  Filled 2016-03-20: qty 1

## 2016-03-20 MED ORDER — POTASSIUM CHLORIDE CRYS ER 20 MEQ PO TBCR
40.0000 meq | EXTENDED_RELEASE_TABLET | Freq: Four times a day (QID) | ORAL | Status: AC
  Administered 2016-03-20 (×2): 40 meq via ORAL
  Filled 2016-03-20 (×2): qty 2

## 2016-03-20 MED ORDER — POTASSIUM CHLORIDE CRYS ER 20 MEQ PO TBCR
40.0000 meq | EXTENDED_RELEASE_TABLET | Freq: Once | ORAL | Status: AC
  Administered 2016-03-20: 40 meq via ORAL
  Filled 2016-03-20: qty 2

## 2016-03-20 MED ORDER — GUAIFENESIN-DM 100-10 MG/5ML PO SYRP
5.0000 mL | ORAL_SOLUTION | ORAL | Status: DC | PRN
  Administered 2016-03-20 – 2016-03-21 (×3): 5 mL via ORAL
  Filled 2016-03-20 (×3): qty 5

## 2016-03-20 MED ORDER — MAGNESIUM OXIDE 400 (241.3 MG) MG PO TABS
800.0000 mg | ORAL_TABLET | Freq: Once | ORAL | Status: AC
  Administered 2016-03-20: 800 mg via ORAL
  Filled 2016-03-20: qty 2

## 2016-03-20 MED ORDER — RIVAROXABAN (XARELTO) EDUCATION KIT FOR DVT/PE PATIENTS
PACK | Freq: Once | Status: AC
  Administered 2016-03-20: 10:00:00
  Filled 2016-03-20: qty 1

## 2016-03-20 MED ORDER — MAGNESIUM SULFATE IN D5W 1-5 GM/100ML-% IV SOLN
1.0000 g | Freq: Once | INTRAVENOUS | Status: DC
  Filled 2016-03-20: qty 100

## 2016-03-20 MED ORDER — RIVAROXABAN 15 MG PO TABS
15.0000 mg | ORAL_TABLET | Freq: Two times a day (BID) | ORAL | Status: DC
  Administered 2016-03-20 – 2016-03-21 (×2): 15 mg via ORAL
  Filled 2016-03-20 (×2): qty 1

## 2016-03-20 MED ORDER — MAGNESIUM HYDROXIDE 400 MG/5ML PO SUSP: 30.0000 mL | Freq: Two times a day (BID) | ORAL | Status: AC

## 2016-03-20 MED ORDER — FUROSEMIDE 10 MG/ML IJ SOLN
60.0000 mg | Freq: Once | INTRAMUSCULAR | Status: AC
  Administered 2016-03-20: 60 mg via INTRAVENOUS
  Filled 2016-03-20: qty 6

## 2016-03-20 MED ORDER — RIVAROXABAN 20 MG PO TABS: 20.0000 mg | ORAL_TABLET | Freq: Every day | ORAL | Status: DC

## 2016-03-20 MED ORDER — PANTOPRAZOLE SODIUM 40 MG PO TBEC
40.0000 mg | DELAYED_RELEASE_TABLET | Freq: Every day | ORAL | Status: DC
  Administered 2016-03-20 – 2016-03-21 (×2): 40 mg via ORAL
  Filled 2016-03-20 (×2): qty 1

## 2016-03-20 NOTE — Progress Notes (Signed)
Foley discontinued at 0900, pt due to void/bladder scan at 1500.

## 2016-03-20 NOTE — Progress Notes (Signed)
Two members from IV team been in with pt for over an hour trying to obtain peripheral IV access.  Per Dr. Candiss Norse, who was consulted regarding central access this morning, pt not good access for PICC or central line due to clotting issues.  IV team has left, MD paged to notify that pt does not have IV access at this time.

## 2016-03-20 NOTE — Evaluation (Signed)
Physical Therapy Evaluation Patient Details Name: Michele Thompson MRN: [id number] DOB: [birth year] Today's Date: 03/20/2016   History of Present Illness  74 yo admitted with bil massive PE with heart strain, Rt LE DVT, RUE mobile DVT, 11/29 NSTEMI with PEA arrest and cardiogenic shock. Intubated 11/29-11/30. PMHx: HTN, HLD admission for CAD 11/26-11/28  Clinical Impression  Pt very pleasant and eager to return to PLOF. Pt reported dyspnea 2/4 with gait but no drop in oxygen sats on 3L with gait. Pt with decreased activity tolerance, mobility and gait who will benefit from acute therapy to maximize mobility, function and independence. Recommend daily mobility with nursing staff with RW for safety.   HR 74-88 sats 97% on 3L    Follow Up Recommendations Home health PT;Supervision - Intermittent    Equipment Recommendations  Rolling walker with 5" wheels;3in1 (PT)    Recommendations for Other Services OT consult     Precautions / Restrictions Precautions Precaution Comments: gentle mobility due to mobile clot      Mobility  Bed Mobility               General bed mobility comments: in chair on arrival  Transfers Overall transfer level: Modified independent   Transfers: Sit to/from Stand Sit to Stand: Supervision         General transfer comment: cues for hand placement x 1, transfers x2 from chair and [facility]  Ambulation/Gait Ambulation/Gait assistance: +2 safety/equipment;Min assist Ambulation Distance (Feet): 35 Feet Assistive device: Rolling walker (2 wheeled) Gait Pattern/deviations: Step-through pattern;Decreased stride length;Trunk flexed   Gait velocity interpretation: Below normal speed for age/gender General Gait Details: decreased speed with cues for posture and breathing technique. Chair to follow for safety and fatigue. Pt walked 35', 30', 10' with seated rest between trials with sats maintained at 97% on 3L  Stairs            Wheelchair Mobility     Modified Rankin (Stroke Patients Only)       Balance Overall balance assessment: No apparent balance deficits (not formally assessed)                                           Pertinent Vitals/Pain Pain Assessment: No/denies pain    Home Living Family/patient expects to be discharged to:: Private residence Living Arrangements: Alone Available Help at Discharge: Family;Available PRN/intermittently Type of Home: House Home Access: Stairs to enter   Entrance Stairs-Number of Steps: 2 Home Layout: One level Home Equipment: None Additional Comments: Pt lives alone in single story home with 2 steps. May go stay with Son in [place] in 2 story home with 1 step. Family works and not available 24hr/day    Prior Function Level of Independence: Independent               Journalist, newspaper        Extremity/Trunk Assessment   Upper Extremity Assessment: Generalized weakness           Lower Extremity Assessment: Generalized weakness      Cervical / Trunk Assessment: Normal  Communication   Communication: No difficulties  Cognition Arousal/Alertness: Awake/alert Behavior During Therapy: WFL for tasks assessed/performed Overall Cognitive Status: Within Functional Limits for tasks assessed                      General Comments  Exercises     Assessment/Plan    PT Assessment Patient needs continued PT services  PT Problem List Decreased mobility;Decreased activity tolerance;Cardiopulmonary status limiting activity;Decreased knowledge of use of DME          PT Treatment Interventions Gait training;Stair training;Functional mobility training;Therapeutic exercise;DME instruction;Therapeutic activities;Patient/family education    PT Goals (Current goals can be found in the Care Plan section)  Acute Rehab PT Goals Patient Stated Goal: return home  PT Goal Formulation: With patient/family Time For Goal Achievement:  04/03/16 Potential to Achieve Goals: Good    Frequency Min 3X/week   Barriers to discharge Decreased caregiver support      Co-evaluation               End of Session Equipment Utilized During Treatment: Gait belt;Oxygen Activity Tolerance: Patient tolerated treatment well Patient left: in chair;with call bell/phone within reach;with family/visitor present;with nursing/sitter in room Nurse Communication: Mobility status         Time: 1142-1202 PT Time Calculation (min) (ACUTE ONLY): 20 min   Charges:   PT Evaluation $PT Eval Moderate Complexity: 1 Procedure     PT G Codes:         B  Mar 21, 2016, 1:14 PM Elwyn Reach, [place]

## 2016-03-20 NOTE — Progress Notes (Signed)
SATURATION QUALIFICATIONS: (This note is used to comply with regulatory documentation for home oxygen)  Patient Saturations on Room Air at Rest = 96%  Patient Saturations on Room Air while Ambulating = 88%  Patient Saturations on 2 Liters of oxygen while Ambulating = 93%  Please briefly explain why patient needs home oxygen:

## 2016-03-20 NOTE — Progress Notes (Signed)
Patient Name: Michele Thompson Date of Encounter: 03/20/2016     Principal Problem:   PE (pulmonary thromboembolism) ([place]) Active Problems:   Hyperlipidemia   Hypertension   Elevated troponin   Hypothyroidism   Rectal bleeding   Thrombocytopenia (HCC)   Hypoxemia   Heparin induced thrombocytopenia (HCC)   Cardiogenic shock (HCC)   Cardiac arrest (HCC)   Anoxic encephalopathy (HCC)    SUBJECTIVE  Denies chest pain or sob.  CURRENT MEDS . amLODipine  5 mg Oral Daily  . atorvastatin  80 mg Oral q1800  . cholecalciferol  1,000 Units Oral Daily  . furosemide  60 mg Intravenous Once  . levothyroxine  100 mcg Oral QAC breakfast  . magnesium hydroxide  30 mL Oral BID  . polyethylene glycol  17 g Oral BID  . potassium chloride  40 mEq Oral Q6H  . potassium chloride  40 mEq Oral Once  . rivaroxaban   Does not apply Once  . rivaroxaban  15 mg Oral BID AC   Followed by  . [START ON 04/11/2016] rivaroxaban  20 mg Oral Q supper  . sodium chloride flush  3 mL Intravenous Q12H    OBJECTIVE  Vitals:   03/19/16 1700 03/19/16 1800 03/19/16 2000 03/20/16 0441  BP: (!) 118/59 136/60 (!) 124/41 128/63  Pulse: 69 72 80 66  Resp: 15 14 20 16   Temp:   98.7 F (37.1 C) 98.2 F (36.8 C)  TempSrc:   Oral Oral  SpO2: 97% 97% 95% 98%  Weight:   202 lb 12.8 oz (92 kg)   Height:   5\' 2"  (1.575 m)     Intake/Output Summary (Last 24 hours) at 03/20/16 0917 Last data filed at 03/19/16 1600  Gross per 24 hour  Intake           582.09 ml  Output             1985 ml  Net         -1402.91 ml   Filed Weights   03/17/16 0216 03/19/16 2000  Weight: 188 lb (85.3 kg) 202 lb 12.8 oz (92 kg)    PHYSICAL EXAM  General: Pleasant, obese hard of hearing woman, NAD. Neuro: Alert and oriented X 3. Moves all extremities spontaneously. Psych: Normal affect. HEENT:  Normal  Neck: Supple without bruits or JVD. Lungs:  Resp regular and unlabored, CTA. Heart: RRR no s3, s4, or murmurs. Abdomen:  Soft, non-tender, non-distended, BS + x 4.  Extremities: No clubbing, cyanosis, 1+ peripheral edema. DP/PT/Radials 2+ and equal bilaterally. Upper extremity ecchymosis remains.  Accessory Clinical Findings  CBC  Recent Labs  03/19/16 0700 03/20/16 0543  WBC 13.0* 9.4  HGB 8.5* 10.4*  HCT 26.0* 31.1*  MCV 93.2 91.7  PLT 109* [id number]*   Basic Metabolic Panel  Recent Labs  03/19/16 0520 03/20/16 0543  NA 141 139  K 3.6 3.0*  CL 115* 108  CO2 19* 24  GLUCOSE 99 96  BUN 12 10  CREATININE 1.06* 0.99  CALCIUM 7.3* 7.5*  MG 2.5* 1.8  PHOS 1.8* 2.6   Liver Function Tests  Recent Labs  03/18/16 1015  ALBUMIN 2.5*   No results for input(s): LIPASE, AMYLASE in the last 72 hours. Cardiac Enzymes  Recent Labs  03/19/16 1838 03/19/16 2348 03/20/16 0543  TROPONINI 0.16* 0.26* 0.19*   BNP Invalid input(s): POCBNP D-Dimer No results for input(s): DDIMER in the last 72 hours. Hemoglobin A1C No results for input(s):  HGBA1C in the last 72 hours. Fasting Lipid Panel No results for input(s): CHOL, HDL, LDLCALC, TRIG, CHOLHDL, LDLDIRECT in the last 72 hours. Thyroid Function Tests No results for input(s): TSH, T4TOTAL, T3FREE, THYROIDAB in the last 72 hours.  Invalid input(s): FREET3  TELE  nsr  Radiology/Studies  Ct Head Wo Contrast  Result Date: 03/17/2016 CLINICAL DATA:  Headache.  Hypertension. EXAM: CT HEAD WITHOUT CONTRAST TECHNIQUE: Contiguous axial images were obtained from the base of the skull through the vertex without intravenous contrast. COMPARISON:  None. FINDINGS: Brain: No evidence of acute infarction, hemorrhage, hydrocephalus, extra-axial collection or mass lesion/mass effect. Vascular: No hyperdense vessel or unexpected calcification. Skull: Normal. Negative for fracture or focal lesion. Sinuses/Orbits: No acute finding. Other: None. IMPRESSION: Negative noncontrast head CT. Electronically Signed   By: Earle Gell M.D.   On: 03/17/2016 07:17   Ct  Angio Chest Pe W And/or Wo Contrast  Result Date: 03/17/2016 CLINICAL DATA:  Chronic shortness of breath. Elevated D-dimer. Patient on Estrogen patches. Initial encounter. EXAM: CT ANGIOGRAPHY CHEST WITH CONTRAST TECHNIQUE: Multidetector CT imaging of the chest was performed using the standard protocol during bolus administration of intravenous contrast. Multiplanar CT image reconstructions and MIPs were obtained to evaluate the vascular anatomy. CONTRAST:  80 mL of Isovue 370 IV contrast COMPARISON:  Chest radiograph performed 03/14/2016 FINDINGS: Cardiovascular: Bilateral pulmonary embolus is noted, extending from the main pulmonary arteries to all lobes of both lungs. The RV/LV ratio of 2.2 corresponds to significant right heart strain and at least submassive pulmonary embolus. Scattered calcification is noted along the thoracic aorta. The proximal great vessels are grossly unremarkable. Mediastinum/Nodes: The mediastinum is grossly unremarkable in appearance. No mediastinal lymphadenopathy is seen. No pericardial effusion is identified. The thyroid gland is diminutive and not well characterized. No axillary lymphadenopathy is seen. Lungs/Pleura: Mild patchy bilateral atelectasis is noted. No pleural effusion or pneumothorax is seen. No masses are identified. Upper Abdomen: The visualized portions of the liver and spleen are unremarkable. The visualized portions of the pancreas is within normal limits. Musculoskeletal: No acute osseous abnormalities are identified. Osseous fusion is noted at T7-T8. The visualized musculature is unremarkable in appearance. Review of the MIP images confirms the above findings. IMPRESSION: 1. Bilateral pulmonary embolus, extending from the main pulmonary arteries to all lobes of both lungs. Positive for acute PE with CT evidence of right heart strain (RV/LV Ratio = 2.2) consistent with at least submassive (intermediate risk) PE. The presence of right heart strain has been  associated with an increased risk of morbidity and mortality. Please activate Code PE by paging [phone]. 2. Mild patchy bilateral atelectasis noted; lungs otherwise clear. 3. Osseous fusion at T7-T8. Critical Value/emergent results were called by telephone at the time of interpretation on 03/17/2016 at 4:21 am to Dr. Pryor Curia, who verbally acknowledged these results. Electronically Signed   By: Garald Balding M.D.   On: 03/17/2016 04:29   Portable Chest Xray  Result Date: 03/18/2016 CLINICAL DATA:  Intubation . EXAM: PORTABLE CHEST 1 VIEW COMPARISON:  03/17/2016 . FINDINGS: Endotracheal tube and NG tube in stable position. Bibasilar atelectasis. Follow-up chest x-rays to demonstrate clearing suggested. Heart size normal. No pleural effusion or pneumothorax. No acute bony abnormality. IMPRESSION: 1. Lines and tubes in stable position. 2. Bibasilar atelectasis. Follow-up chest x-rays demonstrate clearing suggested. Electronically Signed   By: Marcello Moores  Register   On: 03/18/2016 07:32   Dg Chest Port 1 View  Result Date: 03/17/2016 CLINICAL DATA:  Central line placement.  EXAM: PORTABLE CHEST 1 VIEW COMPARISON:  CT 03/17/2016.  Chest x-ray 03/14/2016. FINDINGS: Endotracheal tube noted with tip projected 1.8 cm above the carina. Heart size stable. Low lung volumes. No focal infiltrate. No pleural effusion or pneumothorax. No acute bony abnormality. IMPRESSION: Endotracheal tube tip noted 1.8 cm above the carina. Low lung volumes. No focal infiltrate Electronically Signed   By: Marcello Moores  Register   On: 03/17/2016 13:23   Dg Chest Port 1 View  Result Date: 03/14/2016 CLINICAL DATA:  Shortness of Breath EXAM: PORTABLE CHEST 1 VIEW COMPARISON:  None. FINDINGS: The heart size and mediastinal contours are within normal limits. Both lungs are clear. The visualized skeletal structures are unremarkable. IMPRESSION: No active disease. Electronically Signed   By: Lahoma Crocker M.D.   On: 03/14/2016 16:03     ASSESSMENT AND PLAN  1. PE - she is tolerating [place] and hemodynamically stable. 2. Heparin induced thrombocytopenia - stable. Hold heparin 3. HTN - her blood pressure is now controlled. Continue current meds.  Carleene Overlie Taylor,M.D.  03/20/2016 9:17 AMPatient ID: Michele Thompson, female   DOB: 1941/03/24, 75 y.o.   MRN: [id number]

## 2016-03-20 NOTE — Progress Notes (Addendum)
PROGRESS NOTE                                                                                                                                                                                                             Patient Demographics:    Michele Thompson, is a 75 y.o. female, DOB - 06/26/1940, [id number]  Admit date - 03/17/2016   Admitting Physician Ivor Costa, MD  Outpatient Primary MD for the patient is Tawanna Solo, MD  LOS - 3  Chief Complaint  Patient presents with  . Shortness of Breath       Brief Narrative  - This is a 75 year old Caucasian female with known past medical history of CAD, status post left heart cath with nonobstructive CAD this admission, hypertension, dyslipidemia, hypothyroidism, estradiol patch, questionable rectal bleeding in the past. Patient was admitted on 03/17/2016 for shortness of breath and found to have a massive PE with right heart strain along with large right lower and right upper extremity DVT. Complicated by PA arrest with cardiogenic shock, her workup was also suspicious for HIT with final studies pending, she was seen by pulmonary critical care along with cardiology. Underwent left heart catheterization with nonocclusive CAD, was initially treated with Argatroban and now has been transitioned to oral anticoagulation was xaralto. Was stabilized and transferred to hospitalist care on 03/20/2016.    Subjective:    Rhoderick Moody today has, No headache, No chest pain, No abdominal pain - No Nausea, No new weakness tingling or numbness, No Cough - SOB.    Assessment  & Plan :     1. Acute hypoxic respiratory failure due to massive PE and right heart strain needing TNA. Initial suspicion for HIT, HI T anti-body weakly positive at 0.180, no reflex SRA done. She also had evidence of right-sided upper and lower extremity DVT.  She was initially treated with IV argatroban, now stable  platelet counts, transitioned to oral xaralto. We will check echocardiogram to evaluate RV function. Advance activity. Discharge sewn with outpatient oncology hematology follow-up.  2. NSTEMI likely due to right heart strain from PE, also had PEA arrest requiring CPR. Seen by cardiology, underwent left heart cath with nonocclusive CAD, she is now hemodynamically stable, currently on xaralto along with statin for secondary prevention. Defer any further changes to cardiology. Will check echocardiogram.  3. ARF.  Resolved.  4. Hypokalemia replaced. Monitor.  5. Hypothyroidism. On home dose Synthroid.  6. Questionable recent past history of rectal bleed. Outpatient follow-up with primary GI unless this becomes an active inpatient issue.  7. Dyslipidemia. On statin.    Family Communication  :  None  Code Status :  Full  Diet : Diet Heart Room service appropriate? Yes; Fluid consistency: Thin   Disposition Plan  :  Stay inpt  Consults  :  PCCM, Cards  Procedures  :    LHC 03/16/16:  Nonobstructive CAD w/ mid-LAD irregularity. Normal LV function.  CTA 11/29: Bilateral pulmonary embolism extending from Main pulmonary artery to all lobes of both lungs. Positive for acute pulmonary embolism with RV/LV ratio 2.2.  Bedside Echo 11/29: no pericardial effusion, hyperdynamic LV function and a dilated hypocontractile RV   UE doppler 11/29: positivefor subvalvular hyperacute platelet aggregation versus mobile deep vein thrombosis involving the right axillary vein. There is rouleaux flow in the right subclavian and brachial veins.  LE Doppler 11/29: The RLE is positivefor acute, non-mobile, DVT involving the right common femoral, femoral, posterior tibial, and peroneal veins.     LINES/TUBES: CVC Rt Femoral 11/29>>12/1 2 PIV's 11/29>> ETT 11/29>>11/30 OGT 11/29>>11/30 Foley 11/29>>  SIGNIFICANT EVENTS: 11/26 - 11/28 - Admit w/ Unstable angina w/ LHC on 11/28  11/29 - Admit with  bilateral massive PE's 11/29 S/S HIT placed on argatroban , HIT panel pending 11/29 CT head neg 11/29 PEA arrest with cardiogenic shock   DVT Prophylaxis  :  Xaralto  Lab Results  Component Value Date   PLT 116 (L) 03/20/2016    Inpatient Medications  Scheduled Meds: . amLODipine  5 mg Oral Daily  . atorvastatin  80 mg Oral q1800  . cholecalciferol  1,000 Units Oral Daily  . furosemide  60 mg Intravenous Once  . levothyroxine  100 mcg Oral QAC breakfast  . magnesium hydroxide  30 mL Oral BID  . polyethylene glycol  17 g Oral BID  . potassium chloride  40 mEq Oral Q6H  . potassium chloride  40 mEq Oral Once  . rivaroxaban   Does not apply Once  . rivaroxaban  15 mg Oral BID AC   Followed by  . [START ON 04/11/2016] rivaroxaban  20 mg Oral Q supper  . sodium chloride flush  3 mL Intravenous Q12H   Continuous Infusions: PRN Meds:.acetaminophen **OR** [DISCONTINUED] acetaminophen, [DISCONTINUED] ondansetron **OR** ondansetron (ZOFRAN) IV, oxyCODONE-acetaminophen, phenol, RESOURCE THICKENUP CLEAR, zolpidem  Antibiotics  :    Anti-infectives    None         Objective:   Vitals:   03/19/16 1700 03/19/16 1800 03/19/16 2000 03/20/16 0441  BP: (!) 118/59 136/60 (!) 124/41 128/63  Pulse: 69 72 80 66  Resp: 15 14 20 16   Temp:   98.7 F (37.1 C) 98.2 F (36.8 C)  TempSrc:   Oral Oral  SpO2: 97% 97% 95% 98%  Weight:   92 kg (202 lb 12.8 oz)   Height:   5\' 2"  (1.575 m)     Wt Readings from Last 3 Encounters:  03/19/16 92 kg (202 lb 12.8 oz)  03/14/16 85.3 kg (188 lb)  02/13/16 86.2 kg (190 lb)     Intake/Output Summary (Last 24 hours) at 03/20/16 1054 Last data filed at 03/20/16 1045  Gross per 24 hour  Intake           792.69 ml  Output  2285 ml  Net         -1492.31 ml     Physical Exam  Awake Alert, Oriented X 3, No new F.N deficits, Normal affect [place].AT,PERRAL Supple Neck,No JVD, No cervical lymphadenopathy appriciated.  Symmetrical Chest  wall movement, Good air movement bilaterally, CTAB RRR,No Gallops,Rubs or new Murmurs, No Parasternal Heave +ve B.Sounds, Abd Soft, No tenderness, No organomegaly appriciated, No rebound - guarding or rigidity. No Cyanosis, Clubbing , mild edema, No new Rash or bruise     Data Review:    CBC  Recent Labs Lab 03/14/16 1420  03/17/16 0240  03/18/16 0512 03/18/16 1402 03/19/16 0520 03/19/16 0700 03/20/16 0543  WBC 8.1  < > 6.6  < > 17.8* 15.3* 11.6* 13.0* 9.4  HGB 15.0  < > 13.9  < > 11.7* 10.5* 9.7* 8.5* 10.4*  HCT 43.6  < > 40.7  < > 35.3* 31.4* 29.7* 26.0* 31.1*  PLT 229  < > 34*  < > 145* PLATELET CLUMPS NOTED ON SMEAR, COUNT APPEARS DECREASED PLATELET CLUMPS NOTED ON SMEAR, UNABLE TO ESTIMATE 109* 116*  MCV 91.0  < > 92.3  < > 92.2 92.4 94.3 93.2 91.7  MCH 31.3  < > 31.5  < > 30.5 30.9 30.8 30.5 30.7  MCHC 34.4  < > 34.2  < > 33.1 33.4 32.7 32.7 33.4  RDW 12.7  < > 13.0  < > 13.2 13.7 14.1 13.7 13.0  LYMPHSABS 2.4  --  2.2  --   --   --   --   --   --   MONOABS 0.4  --  0.3  --   --   --   --   --   --   EOSABS 0.1  --  0.1  --   --   --   --   --   --   BASOSABS 0.0  --  0.0  --   --   --   --   --   --   < > = values in this interval not displayed.  Chemistries   Recent Labs Lab 03/17/16 0240 03/17/16 1433 03/18/16 0512 03/19/16 0520 03/20/16 0543  NA 141 140 142 141 139  K 3.5 4.3 3.2* 3.6 3.0*  CL 113* 112* 115* 115* 108  CO2 15* 15* 20* 19* 24  GLUCOSE 117* 288* 108* 99 96  BUN 12 9 12 12 10   CREATININE 1.00 1.28* 1.08* 1.06* 0.99  CALCIUM 9.0 6.6* 6.9* 7.3* 7.5*  MG  --  1.8 1.5* 2.5* 1.8   ------------------------------------------------------------------------------------------------------------------ No results for input(s): CHOL, HDL, LDLCALC, TRIG, CHOLHDL, LDLDIRECT in the last 72 hours.  Lab Results  Component Value Date   HGBA1C 5.5 03/14/2016    ------------------------------------------------------------------------------------------------------------------ No results for input(s): TSH, T4TOTAL, T3FREE, THYROIDAB in the last 72 hours.  Invalid input(s): FREET3 ------------------------------------------------------------------------------------------------------------------ No results for input(s): VITAMINB12, FOLATE, FERRITIN, TIBC, IRON, RETICCTPCT in the last 72 hours.  Coagulation profile  Recent Labs Lab 03/17/16 0534 03/17/16 0546 03/17/16 1612 03/17/16 2015 03/18/16 0512  INR 1.04 1.06 2.14 1.76 1.36    No results for input(s): DDIMER in the last 72 hours.  Cardiac Enzymes  Recent Labs Lab 03/19/16 2348 03/20/16 0543 03/20/16 0936  TROPONINI 0.26* 0.19* 0.11*   ------------------------------------------------------------------------------------------------------------------    Component Value Date/Time   BNP 357.2 (H) 03/17/2016 0534    Micro Results Recent Results (from the past 240 hour(s))  MRSA PCR Screening     Status:  None   Collection Time: 03/14/16  8:25 PM  Result Value Ref Range Status   MRSA by PCR NEGATIVE NEGATIVE Final    Comment:        The GeneXpert MRSA Assay (FDA approved for NASAL specimens only), is one component of a comprehensive MRSA colonization surveillance program. It is not intended to diagnose MRSA infection nor to guide or monitor treatment for MRSA infections.   MRSA PCR Screening     Status: None   Collection Time: 03/17/16  8:00 AM  Result Value Ref Range Status   MRSA by PCR NEGATIVE NEGATIVE Final    Comment:        The GeneXpert MRSA Assay (FDA approved for NASAL specimens only), is one component of a comprehensive MRSA colonization surveillance program. It is not intended to diagnose MRSA infection nor to guide or monitor treatment for MRSA infections.     Radiology Reports Ct Head Wo Contrast  Result Date: 03/17/2016 CLINICAL DATA:   Headache.  Hypertension. EXAM: CT HEAD WITHOUT CONTRAST TECHNIQUE: Contiguous axial images were obtained from the base of the skull through the vertex without intravenous contrast. COMPARISON:  None. FINDINGS: Brain: No evidence of acute infarction, hemorrhage, hydrocephalus, extra-axial collection or mass lesion/mass effect. Vascular: No hyperdense vessel or unexpected calcification. Skull: Normal. Negative for fracture or focal lesion. Sinuses/Orbits: No acute finding. Other: None. IMPRESSION: Negative noncontrast head CT. Electronically Signed   By: Earle Gell M.D.   On: 03/17/2016 07:17   Ct Angio Chest Pe W And/or Wo Contrast  Result Date: 03/17/2016 CLINICAL DATA:  Chronic shortness of breath. Elevated D-dimer. Patient on Estrogen patches. Initial encounter. EXAM: CT ANGIOGRAPHY CHEST WITH CONTRAST TECHNIQUE: Multidetector CT imaging of the chest was performed using the standard protocol during bolus administration of intravenous contrast. Multiplanar CT image reconstructions and MIPs were obtained to evaluate the vascular anatomy. CONTRAST:  80 mL of Isovue 370 IV contrast COMPARISON:  Chest radiograph performed 03/14/2016 FINDINGS: Cardiovascular: Bilateral pulmonary embolus is noted, extending from the main pulmonary arteries to all lobes of both lungs. The RV/LV ratio of 2.2 corresponds to significant right heart strain and at least submassive pulmonary embolus. Scattered calcification is noted along the thoracic aorta. The proximal great vessels are grossly unremarkable. Mediastinum/Nodes: The mediastinum is grossly unremarkable in appearance. No mediastinal lymphadenopathy is seen. No pericardial effusion is identified. The thyroid gland is diminutive and not well characterized. No axillary lymphadenopathy is seen. Lungs/Pleura: Mild patchy bilateral atelectasis is noted. No pleural effusion or pneumothorax is seen. No masses are identified. Upper Abdomen: The visualized portions of the liver and  spleen are unremarkable. The visualized portions of the pancreas is within normal limits. Musculoskeletal: No acute osseous abnormalities are identified. Osseous fusion is noted at T7-T8. The visualized musculature is unremarkable in appearance. Review of the MIP images confirms the above findings. IMPRESSION: 1. Bilateral pulmonary embolus, extending from the main pulmonary arteries to all lobes of both lungs. Positive for acute PE with CT evidence of right heart strain (RV/LV Ratio = 2.2) consistent with at least submassive (intermediate risk) PE. The presence of right heart strain has been associated with an increased risk of morbidity and mortality. Please activate Code PE by paging [phone]. 2. Mild patchy bilateral atelectasis noted; lungs otherwise clear. 3. Osseous fusion at T7-T8. Critical Value/emergent results were called by telephone at the time of interpretation on 03/17/2016 at 4:21 am to Dr. Pryor Curia, who verbally acknowledged these results. Electronically Signed   By: Jacqulynn Cadet  Chang M.D.   On: 03/17/2016 04:29   Portable Chest Xray  Result Date: 03/18/2016 CLINICAL DATA:  Intubation . EXAM: PORTABLE CHEST 1 VIEW COMPARISON:  03/17/2016 . FINDINGS: Endotracheal tube and NG tube in stable position. Bibasilar atelectasis. Follow-up chest x-rays to demonstrate clearing suggested. Heart size normal. No pleural effusion or pneumothorax. No acute bony abnormality. IMPRESSION: 1. Lines and tubes in stable position. 2. Bibasilar atelectasis. Follow-up chest x-rays demonstrate clearing suggested. Electronically Signed   By: Marcello Moores  Register   On: 03/18/2016 07:32   Dg Chest Port 1 View  Result Date: 03/17/2016 CLINICAL DATA:  Central line placement. EXAM: PORTABLE CHEST 1 VIEW COMPARISON:  CT 03/17/2016.  Chest x-ray 03/14/2016. FINDINGS: Endotracheal tube noted with tip projected 1.8 cm above the carina. Heart size stable. Low lung volumes. No focal infiltrate. No pleural effusion or  pneumothorax. No acute bony abnormality. IMPRESSION: Endotracheal tube tip noted 1.8 cm above the carina. Low lung volumes. No focal infiltrate Electronically Signed   By: Marcello Moores  Register   On: 03/17/2016 13:23   Dg Chest Port 1 View  Result Date: 03/14/2016 CLINICAL DATA:  Shortness of Breath EXAM: PORTABLE CHEST 1 VIEW COMPARISON:  None. FINDINGS: The heart size and mediastinal contours are within normal limits. Both lungs are clear. The visualized skeletal structures are unremarkable. IMPRESSION: No active disease. Electronically Signed   By: Lahoma Crocker M.D.   On: 03/14/2016 16:03    Time Spent in minutes  30   , K M.D on 03/20/2016 at 10:54 AM  Between 7am to 7pm - Pager - [phone]  After 7pm go to [email or web address] - password [facility]  [facility] -  Office  [phone]

## 2016-03-20 NOTE — Discharge Instructions (Addendum)
Follow with Primary MD Tawanna Solo, MD in 7 days   Get CBC, CMP, 2 view Chest X ray checked  by Primary MD or SNF MD in 5-7 days ( we routinely change or add medications that can affect your baseline labs and fluid status, therefore we recommend that you get the mentioned basic workup next visit with your PCP, your PCP may decide not to get them or add new tests based on their clinical decision)   Activity: As tolerated with Full fall precautions use walker/cane & assistance as needed   Disposition Home    Diet:   Heart Healthy    For Heart failure patients - Check your Weight same time everyday, if you gain over 2 pounds, or you develop in leg swelling, experience more shortness of breath or chest pain, call your Primary MD immediately. Follow Cardiac Low Salt Diet and 1.5 lit/day fluid restriction.   On your next visit with your primary care physician please Get Medicines reviewed and adjusted.   Please request your Prim.MD to go over all Hospital Tests and Procedure/Radiological results at the follow up, please get all Hospital records sent to your Prim MD by signing hospital release before you go home.   If you experience worsening of your admission symptoms, develop shortness of breath, life threatening emergency, suicidal or homicidal thoughts you must seek medical attention immediately by calling 911 or calling your MD immediately  if symptoms less severe.  You Must read complete instructions/literature along with all the possible adverse reactions/side effects for all the Medicines you take and that have been prescribed to you. Take any new Medicines after you have completely understood and accpet all the possible adverse reactions/side effects.   Do not drive, operate heavy machinery, perform activities at heights, swimming or participation in water activities or provide baby sitting services if your were admitted for syncope or siezures until you have seen by Primary MD or a  Neurologist and advised to do so again.  Do not drive when taking Pain medications.    Do not take more than prescribed Pain, Sleep and Anxiety Medications  Special Instructions: If you have smoked or chewed Tobacco  in the last 2 yrs please stop smoking, stop any regular Alcohol  and or any Recreational drug use.  Wear Seat belts while driving.   Please note  You were cared for by a hospitalist during your hospital stay. If you have any questions about your discharge medications or the care you received while you were in the hospital after you are discharged, you can call the unit and asked to speak with the hospitalist on call if the hospitalist that took care of you is not available. Once you are discharged, your primary care physician will handle any further medical issues. Please note that NO REFILLS for any discharge medications will be authorized once you are discharged, as it is imperative that you return to your primary care physician (or establish a relationship with a primary care physician if you do not have one) for your aftercare needs so that they can reassess your need for medications and monitor your lab values.            Information on my medicine - XARELTO (rivaroxaban)  This medication education was reviewed with me or my healthcare representative as part of my discharge preparation.  The pharmacist that spoke with me during my hospital stay was:  Romona Curls, [place]? Xarelto  was prescribed to treat blood clots that may have been found in the veins of your legs (deep vein thrombosis) or in your lungs (pulmonary embolism) and to reduce the risk of them occurring again.  What do you need to know about Xarelto? The starting dose is one 15 mg tablet taken TWICE daily with food for the FIRST 21 DAYS then on (enter date)  04/11/2016  the dose is changed to one 20 mg tablet taken ONCE A DAY with your evening meal.  DO NOT stop  taking Xarelto without talking to the health care provider who prescribed the medication.  Refill your prescription for 20 mg tablets before you run out.  After discharge, you should have regular check-up appointments with your healthcare provider that is prescribing your Xarelto.  In the future your dose may need to be changed if your kidney function changes by a significant amount.  What do you do if you miss a dose? If you are taking Xarelto TWICE DAILY and you miss a dose, take it as soon as you remember. You may take two 15 mg tablets (total 30 mg) at the same time then resume your regularly scheduled 15 mg twice daily the next day.  If you are taking Xarelto ONCE DAILY and you miss a dose, take it as soon as you remember on the same day then continue your regularly scheduled once daily regimen the next day. Do not take two doses of Xarelto at the same time.   Important Safety Information Xarelto is a blood thinner medicine that can cause bleeding. You should call your healthcare provider right away if you experience any of the following: ? Bleeding from an injury or your nose that does not stop. ? Unusual colored urine (red or dark brown) or unusual colored stools (red or black). ? Unusual bruising for unknown reasons. ? A serious fall or if you hit your head (even if there is no bleeding).  Some medicines may interact with Xarelto and might increase your risk of bleeding while on Xarelto. To help avoid this, consult your healthcare provider or pharmacist prior to using any new prescription or non-prescription medications, including herbals, vitamins, non-steroidal anti-inflammatory drugs (NSAIDs) and supplements.  This website has more information on Xarelto: [email or web address]

## 2016-03-21 ENCOUNTER — Other Ambulatory Visit (HOSPITAL_COMMUNITY): Payer: Self-pay

## 2016-03-21 ENCOUNTER — Inpatient Hospital Stay (HOSPITAL_COMMUNITY): Payer: Medicare Other

## 2016-03-21 DIAGNOSIS — I509 Heart failure, unspecified: Secondary | ICD-10-CM

## 2016-03-21 LAB — CBC
HCT: 31.9 % — ABNORMAL LOW (ref 36.0–46.0)
Hemoglobin: 10.9 g/dL — ABNORMAL LOW (ref 12.0–15.0)
MCH: 31.1 pg (ref 26.0–34.0)
MCHC: 34.2 g/dL (ref 30.0–36.0)
MCV: 90.9 fL (ref 78.0–100.0)
Platelets: 134 10*3/uL — ABNORMAL LOW (ref 150–400)
RBC: 3.51 MIL/uL — ABNORMAL LOW (ref 3.87–5.11)
RDW: 12.7 % (ref 11.5–15.5)
WBC: 7.5 10*3/uL (ref 4.0–10.5)

## 2016-03-21 LAB — BASIC METABOLIC PANEL
Anion gap: 7 (ref 5–15)
BUN: 9 mg/dL (ref 6–20)
CO2: 26 mmol/L (ref 22–32)
Calcium: 8 mg/dL — ABNORMAL LOW (ref 8.9–10.3)
Chloride: 106 mmol/L (ref 101–111)
Creatinine, Ser: 0.87 mg/dL (ref 0.44–1.00)
GFR calc Af Amer: >60 mL/min (ref >60)
GFR calc non Af Amer: >60 mL/min (ref >60)
Glucose, Bld: 107 mg/dL — ABNORMAL HIGH (ref 65–99)
Potassium: 3.9 mmol/L (ref 3.5–5.1)
Sodium: 139 mmol/L (ref 135–145)

## 2016-03-21 LAB — ECHOCARDIOGRAM COMPLETE
Height: 62 in
Weight: 3244.8 oz

## 2016-03-21 LAB — MAGNESIUM: Magnesium: 1.8 mg/dL (ref 1.7–2.4)

## 2016-03-21 LAB — TROPONIN I
Troponin I: 0.08 ng/mL (ref <0.03)
Troponin I: 0.09 ng/mL (ref <0.03)

## 2016-03-21 MED ORDER — DOCUSATE SODIUM 100 MG PO CAPS: 100.0000 mg | ORAL_CAPSULE | Freq: Two times a day (BID) | ORAL | 0 refills | Status: DC | PRN

## 2016-03-21 MED ORDER — RIVAROXABAN 20 MG PO TABS: 20.0000 mg | ORAL_TABLET | Freq: Every day | ORAL | 0 refills | Status: DC

## 2016-03-21 MED ORDER — POLYETHYLENE GLYCOL 3350 17 G PO PACK: 17.0000 g | PACK | Freq: Every day | ORAL | 0 refills | Status: DC

## 2016-03-21 MED ORDER — ONDANSETRON HCL 4 MG PO TABS: 4.0000 mg | ORAL_TABLET | Freq: Three times a day (TID) | ORAL | 0 refills | Status: DC | PRN

## 2016-03-21 MED ORDER — ONDANSETRON HCL 4 MG PO TABS
4.0000 mg | ORAL_TABLET | Freq: Four times a day (QID) | ORAL | Status: DC | PRN
  Administered 2016-03-21: 4 mg via ORAL
  Filled 2016-03-21: qty 1

## 2016-03-21 MED ORDER — ONDANSETRON HCL 4 MG/2ML IJ SOLN: 4.0000 mg | Freq: Four times a day (QID) | INTRAMUSCULAR | Status: DC | PRN

## 2016-03-21 MED ORDER — RIVAROXABAN 15 MG PO TABS: 15.0000 mg | ORAL_TABLET | Freq: Two times a day (BID) | ORAL | 0 refills | Status: DC

## 2016-03-21 NOTE — Discharge Summary (Signed)
Michele Thompson [id number] DOB: 05/12/1940 DOA: 03/17/2016  PCP: Tawanna Solo, MD  Admit date: 03/17/2016  Discharge date: 03/21/2016  Admitted From: Home Disposition:  Home   Recommendations for Outpatient Follow-up:   Follow up with PCP in 1-2 weeks  PCP Please obtain BMP/CBC, 2 view CXR in 1week,  (see Discharge instructions)   PCP Please follow up on the following pending results: She must follow with GI and oncology on a close basis   Home Health: Home PT Equipment/Devices: 5 wheel rolling walker Consultations: Pulmonary, cardiology Discharge Condition: Fair  CODE STATUS: Full   Diet Recommendation:  Heart Healthy    Chief Complaint  Patient presents with  . Shortness of Breath     Brief history of present illness from the day of admission and additional interim summary     This is a 75 year old Caucasian female with known past medical history of CAD, status post left heart cath with nonobstructive CAD this admission, hypertension, dyslipidemia, hypothyroidism, estradiol patch, questionable rectal bleeding in the past mostly related to hard stool off to constipation. Patient was admitted on 03/17/2016 for shortness of breath and found to have a massive PE with right heart strain along with large right lower and right upper extremity DVT. Complicated by PEA arrest with cardiogenic shock requiring TNK, her workup was also suspicious for HIT with final studies did not prove the same, she was seen by pulmonary critical care along with cardiology. She had recently few days prior to this admission been admitted by cardiology and had undergone left heart catheterization with nonocclusive CAD, she was stabilized and transferred to hospitalist care on 03/20/2016 on oral xaralto.  Hospital issues addressed      1. Acute hypoxic respiratory failure due to massive PE and right heart strain needing TNA. Initial suspicion for HIT, HIT anti-body weakly positive at 0.180 Which is less than top normal. Likely her thrombocytopenia was due to massive consumption from large clot burden She also had evidence of right-sided upper and lower extremity DVT.  She was initially treated with IV argatroban, now stable platelet counts, transitioned to oral xaralto. He now has stable echocardiogram with stable RV function, she is stable with activity, at rest she is not requiring oxygen, she will be ambulated to see if she qualifies for home oxygen if she doesn't will be ordered. She will get home PT. Will be discharged with outpatient follow-up with PCP and hematology one time to pursue the large clot burden. Kindly see #6 below for her ongoing GI symptoms.  2. NSTEMI likely due to right heart strain from PE, also had PEA arrest requiring CPR and TNK. Prior to this admission she was admitted by cardiology and she underwent left heart cath with nonocclusive CAD, she is now hemodynamically stable, currently on xaralto along with statin for secondary prevention. Defer any further changes to cardiology. Stable echocardiogram as below with preserved EF and preserved RV function.  3. ARF. Resolved.  4. Hypokalemia replaced and stable  5. Hypothyroidism. On home dose Synthroid.  6. Questionable recent past history of rectal bleed History of nausea and poor appetite for last 1 year. Her rectal bleed seems to be related to constipation related hard stools and has not been an issue this admission, despite anticoagulation no bleeding. She does haven't committed nausea which actually is more concerning to me. She has a pending appointment with GI physician Dr. Amedeo Plenty I will request Dr. Amedeo Plenty to consider EGD along with colonoscopy with her planned colonoscopy later this month..  7. Dyslipidemia. On statin.   Discharge  diagnosis     Principal Problem:   PE (pulmonary thromboembolism) (HCC) Active Problems:   Hyperlipidemia   Hypertension   Elevated troponin   Hypothyroidism   Rectal bleeding   Thrombocytopenia (HCC)   Hypoxemia   Heparin induced thrombocytopenia (HCC)   Cardiogenic shock (HCC)   Cardiac arrest (HCC)   Anoxic encephalopathy (HCC)    Discharge instructions    Discharge Instructions    Diet - low sodium heart healthy    Complete by:  As directed    Discharge instructions    Complete by:  As directed    Follow with Primary MD Tawanna Solo, MD in 7 days   Get CBC, CMP, 2 view Chest X ray checked  by Primary MD or SNF MD in 5-7 days ( we routinely change or add medications that can affect your baseline labs and fluid status, therefore we recommend that you get the mentioned basic workup next visit with your PCP, your PCP may decide not to get them or add new tests based on their clinical decision)   Activity: As tolerated with Full fall precautions use walker/cane & assistance as needed   Disposition Home    Diet:   Heart Healthy    For Heart failure patients - Check your Weight same time everyday, if you gain over 2 pounds, or you develop in leg swelling, experience more shortness of breath or chest pain, call your Primary MD immediately. Follow Cardiac Low Salt Diet and 1.5 lit/day fluid restriction.   On your next visit with your primary care physician please Get Medicines reviewed and adjusted.   Please request your Prim.MD to go over all Hospital Tests and Procedure/Radiological results at the follow up, please get all Hospital records sent to your Prim MD by signing hospital release before you go home.   If you experience worsening of your admission symptoms, develop shortness of breath, life threatening emergency, suicidal or homicidal thoughts you must seek medical attention immediately by calling 911 or calling your MD immediately  if symptoms less  severe.  You Must read complete instructions/literature along with all the possible adverse reactions/side effects for all the Medicines you take and that have been prescribed to you. Take any new Medicines after you have completely understood and accpet all the possible adverse reactions/side effects.   Do not drive, operate heavy machinery, perform activities at heights, swimming or participation in water activities or provide baby sitting services if your were admitted for syncope or siezures until you have seen by Primary MD or a Neurologist and advised to do so again.  Do not drive when taking Pain medications.    Do not take more than prescribed Pain, Sleep and Anxiety Medications  Special Instructions: If you have smoked or chewed Tobacco  in the last 2 yrs please stop smoking, stop any regular Alcohol  and or any Recreational drug use.  Wear Seat belts while driving.   Please note  You were  cared for by a hospitalist during your hospital stay. If you have any questions about your discharge medications or the care you received while you were in the hospital after you are discharged, you can call the unit and asked to speak with the hospitalist on call if the hospitalist that took care of you is not available. Once you are discharged, your primary care physician will handle any further medical issues. Please note that NO REFILLS for any discharge medications will be authorized once you are discharged, as it is imperative that you return to your primary care physician (or establish a relationship with a primary care physician if you do not have one) for your aftercare needs so that they can reassess your need for medications and monitor your lab values.   Increase activity slowly    Complete by:  As directed       Discharge Medications     Medication List    STOP taking these medications   aspirin 81 MG tablet     TAKE these medications   amLODipine 5 MG tablet Commonly known  as:  NORVASC Take 1 tablet (5 mg total) by mouth daily.   atorvastatin 80 MG tablet Commonly known as:  LIPITOR Take 1 tablet (80 mg total) by mouth daily at 6 PM.   carvedilol 6.25 MG tablet Commonly known as:  COREG Take 1 tablet (6.25 mg total) by mouth 2 (two) times daily with a meal.   docusate sodium 100 MG capsule Commonly known as:  COLACE Take 1 capsule (100 mg total) by mouth 2 (two) times daily as needed for mild constipation.   estradiol 0.05 MG/24HR patch Commonly known as:  VIVELLE-DOT PLACE 1 PATCH ONTO THE SKIN TWICE A WEEK What changed:  how much to take  how to take this  when to take this  additional instructions   hydrochlorothiazide 12.5 MG capsule Commonly known as:  MICROZIDE Take 12.5 mg by mouth daily.   levothyroxine 100 MCG tablet Commonly known as:  SYNTHROID, LEVOTHROID Take 100 mcg by mouth daily before breakfast.   lisinopril 40 MG tablet Commonly known as:  PRINIVIL,ZESTRIL Take 1 tablet (40 mg total) by mouth daily.   ondansetron 4 MG tablet Commonly known as:  ZOFRAN Take 1 tablet (4 mg total) by mouth every 8 (eight) hours as needed for nausea or vomiting.   polyethylene glycol packet Commonly known as:  MIRALAX / GLYCOLAX Take 17 g by mouth daily.   Rivaroxaban 15 MG Tabs tablet Commonly known as:  XARELTO Take 1 tablet (15 mg total) by mouth 2 (two) times daily before a meal.   rivaroxaban 20 MG Tabs tablet Commonly known as:  XARELTO Take 1 tablet (20 mg total) by mouth daily with supper. Start taking on:  04/11/2016   VITAMIN D PO Take by mouth.            Durable Medical Equipment        Start     Ordered   03/21/16 [id number]  For home use only DME oxygen  Once    Question Answer Comment  Mode or (Route) Nasal cannula   Liters per Minute 2   Frequency Continuous (stationary and portable oxygen unit needed)   Oxygen conserving device Yes   Oxygen delivery system Gas      03/21/16 0628      Follow-up  [place] Follow up.   Why:  home oxygen, 3n1 and  rolling walker Contact information: [place] [address] [place] Follow up.   Why:  home health agency  Contact information: [place] [address] [phone]        Tawanna Solo, MD. Schedule an appointment as soon as possible for a visit in 1 week(s).   Specialty:  Family Medicine Contact information: [place] [address] [phone]        Larae Grooms, MD. Schedule an appointment as soon as possible for a visit in 2 week(s).   Specialties:  Cardiology, Radiology, Interventional Cardiology Contact information: [id number] [place] [address] [phone]        Heath Lark, MD. Schedule an appointment as soon as possible for a visit in 2 week(s).   Specialty:  Hematology and Oncology Why:  diffuse blood clots Contact information: [place] [address] [place], MD. Schedule an appointment as soon as possible for a visit in 2 week(s).   Specialty:  Gastroenterology Why:  EGD and Colonoscopy Contact information: [id number] [place] [address] [phone]           Major procedures and Radiology Reports - PLEASE review detailed and final reports thoroughly  -     TTE - Left ventricle: The cavity size was normal. Wall thickness was increased in a pattern of mild LVH. Systolic function was normal. The estimated ejection fraction was in the range of 60% to 65%. Wall motion was normal; there were no regional wall motion abnormalities. Doppler parameters are consistent with abnormal left ventricular relaxation (grade 1 diastolic dysfunction). - Mitral valve: Mildly calcified annulus.   Venous US -  Right upper extremity is positive for subvalvular hyperacute patelet  aggregation versus mobile deep vein thrombosis involving the right axillary vein. There is rouleaux flow in the right subclavian and brachial veins.   Findings consistent with acute deep vein thrombosis involving the right common femoral vein, right femoral vein, right posterial tibial vein, and right peroneal vein. - No evidence of deep vein thrombosis involving the left lower  extremity. No evidence of Baker&'s cyst on the right or left.   Ct Head Wo Contrast  Result Date: 03/17/2016 CLINICAL DATA:  Headache.  Hypertension. EXAM: CT HEAD WITHOUT CONTRAST TECHNIQUE: Contiguous axial images were obtained from the base of the skull through the vertex without intravenous contrast. COMPARISON:  None. FINDINGS: Brain: No evidence of acute infarction, hemorrhage, hydrocephalus, extra-axial collection or mass lesion/mass effect. Vascular: No hyperdense vessel or unexpected calcification. Skull: Normal. Negative for fracture or focal lesion. Sinuses/Orbits: No acute finding. Other: None. IMPRESSION: Negative noncontrast head CT. Electronically Signed   By: Earle Gell M.D.   On: 03/17/2016 07:17   Ct Angio Chest Pe W And/or Wo Contrast  Result Date: 03/17/2016 CLINICAL DATA:  Chronic shortness of breath. Elevated D-dimer. Patient on Estrogen patches. Initial encounter. EXAM: CT ANGIOGRAPHY CHEST WITH CONTRAST TECHNIQUE: Multidetector CT imaging of the chest was performed using the standard protocol during bolus administration of intravenous contrast. Multiplanar CT image reconstructions and MIPs were obtained to evaluate the vascular anatomy. CONTRAST:  80 mL of Isovue 370 IV contrast COMPARISON:  Chest radiograph performed 03/14/2016 FINDINGS: Cardiovascular: Bilateral pulmonary embolus is noted, extending from the main pulmonary arteries to all lobes of both lungs. The RV/LV ratio of 2.2 corresponds  to significant right heart strain and at least submassive pulmonary embolus. Scattered calcification is noted  along the thoracic aorta. The proximal great vessels are grossly unremarkable. Mediastinum/Nodes: The mediastinum is grossly unremarkable in appearance. No mediastinal lymphadenopathy is seen. No pericardial effusion is identified. The thyroid gland is diminutive and not well characterized. No axillary lymphadenopathy is seen. Lungs/Pleura: Mild patchy bilateral atelectasis is noted. No pleural effusion or pneumothorax is seen. No masses are identified. Upper Abdomen: The visualized portions of the liver and spleen are unremarkable. The visualized portions of the pancreas is within normal limits. Musculoskeletal: No acute osseous abnormalities are identified. Osseous fusion is noted at T7-T8. The visualized musculature is unremarkable in appearance. Review of the MIP images confirms the above findings. IMPRESSION: 1. Bilateral pulmonary embolus, extending from the main pulmonary arteries to all lobes of both lungs. Positive for acute PE with CT evidence of right heart strain (RV/LV Ratio = 2.2) consistent with at least submassive (intermediate risk) PE. The presence of right heart strain has been associated with an increased risk of morbidity and mortality. Please activate Code PE by paging [phone]. 2. Mild patchy bilateral atelectasis noted; lungs otherwise clear. 3. Osseous fusion at T7-T8. Critical Value/emergent results were called by telephone at the time of interpretation on 03/17/2016 at 4:21 am to Dr. Pryor Curia, who verbally acknowledged these results. Electronically Signed   By: Garald Balding M.D.   On: 03/17/2016 04:29   Portable Chest Xray  Result Date: 03/18/2016 CLINICAL DATA:  Intubation . EXAM: PORTABLE CHEST 1 VIEW COMPARISON:  03/17/2016 . FINDINGS: Endotracheal tube and NG tube in stable position. Bibasilar atelectasis. Follow-up chest x-rays to demonstrate clearing suggested. Heart size normal. No pleural effusion or pneumothorax. No acute bony abnormality. IMPRESSION: 1. Lines and  tubes in stable position. 2. Bibasilar atelectasis. Follow-up chest x-rays demonstrate clearing suggested. Electronically Signed   By: Marcello Moores  Register   On: 03/18/2016 07:32   Dg Chest Port 1 View  Result Date: 03/17/2016 CLINICAL DATA:  Central line placement. EXAM: PORTABLE CHEST 1 VIEW COMPARISON:  CT 03/17/2016.  Chest x-ray 03/14/2016. FINDINGS: Endotracheal tube noted with tip projected 1.8 cm above the carina. Heart size stable. Low lung volumes. No focal infiltrate. No pleural effusion or pneumothorax. No acute bony abnormality. IMPRESSION: Endotracheal tube tip noted 1.8 cm above the carina. Low lung volumes. No focal infiltrate Electronically Signed   By: Marcello Moores  Register   On: 03/17/2016 13:23   Dg Chest Port 1 View  Result Date: 03/14/2016 CLINICAL DATA:  Shortness of Breath EXAM: PORTABLE CHEST 1 VIEW COMPARISON:  None. FINDINGS: The heart size and mediastinal contours are within normal limits. Both lungs are clear. The visualized skeletal structures are unremarkable. IMPRESSION: No active disease. Electronically Signed   By: Lahoma Crocker M.D.   On: 03/14/2016 16:03    Micro Results     Recent Results (from the past 240 hour(s))  MRSA PCR Screening     Status: None   Collection Time: 03/14/16  8:25 PM  Result Value Ref Range Status   MRSA by PCR NEGATIVE NEGATIVE Final    Comment:        The GeneXpert MRSA Assay (FDA approved for NASAL specimens only), is one component of a comprehensive MRSA colonization surveillance program. It is not intended to diagnose MRSA infection nor to guide or monitor treatment for MRSA infections.   MRSA PCR Screening     Status: None   Collection Time: 03/17/16  8:00 AM  Result  Value Ref Range Status   MRSA by PCR NEGATIVE NEGATIVE Final    Comment:        The GeneXpert MRSA Assay (FDA approved for NASAL specimens only), is one component of a comprehensive MRSA colonization surveillance program. It is not intended to diagnose  MRSA infection nor to guide or monitor treatment for MRSA infections.     Today   Subjective    Tochi Mishler today has no headache,no chest abdominal pain,no new weakness tingling or numbness, feels much better wants to go home today.     Objective   Blood pressure (!) 149/67, pulse 80, temperature 98.8 F (37.1 C), temperature source Oral, resp. rate 16, height 5\' 2"  (1.575 m), weight 92 kg (202 lb 12.8 oz), SpO2 95 %.   Intake/Output Summary (Last 24 hours) at 03/21/16 1041 Last data filed at 03/21/16 0800  Gross per 24 hour  Intake              720 ml  Output             4050 ml  Net            -3330 ml    Exam Awake Alert, Oriented x 3, No new F.N deficits, Normal affect [place].AT,PERRAL Supple Neck,No JVD, No cervical lymphadenopathy appriciated.  Symmetrical Chest wall movement, Good air movement bilaterally, CTAB RRR,No Gallops,Rubs or new Murmurs, No Parasternal Heave +ve B.Sounds, Abd Soft, Non tender, No organomegaly appriciated, No rebound -guarding or rigidity. No Cyanosis, Clubbing or edema, No new Rash or bruise   Data Review   CBC w Diff: Lab Results  Component Value Date   WBC 7.5 03/21/2016   HGB 10.9 (L) 03/21/2016   HCT 31.9 (L) 03/21/2016   PLT 134 (L) 03/21/2016   LYMPHOPCT 34 03/17/2016   MONOPCT 5 03/17/2016   EOSPCT 2 03/17/2016   BASOPCT 0 03/17/2016    CMP: Lab Results  Component Value Date   NA 139 03/21/2016   K 3.9 03/21/2016   CL 106 03/21/2016   CO2 26 03/21/2016   BUN 9 03/21/2016   CREATININE 0.87 03/21/2016   ALBUMIN 2.5 (L) 03/18/2016  .   Total Time in preparing paper work, data evaluation and todays exam - 35 minutes  Thurnell Lose M.D on 03/21/2016 at 10:41 AM  [facility]   Office  [phone]

## 2016-03-21 NOTE — Progress Notes (Signed)
SATURATION QUALIFICATIONS: (This note is used to comply with regulatory documentation for home oxygen)  Patient Saturations on Room Air at Rest = 94%  Patient Saturations on Room Air while Ambulating = 91%  Patient Saturations on N/A Liters of oxygen while Ambulating = N/A%  Please briefly explain why patient needs home oxygen:

## 2016-03-21 NOTE — Care Management Note (Signed)
Case Management Note  Patient Details  Name: Michele Thompson MRN: [id number] Date of Birth: 1940-07-04  Subjective/Objective:                  Shortness of Breath Action/Plan: Discharge planning Expected Discharge Date:  03/21/16               Expected Discharge Plan:  [place]  In-House Referral:     Discharge planning Services  CM Consult  Post Acute Care Choice:  Home Health Choice offered to:  Patient  DME Arranged:  3-N-1, Walker rolling, Oxygen DME Agency:  [place] Arranged:  PT, Nurse's Aide [place] Agency:  [place]  Status of Service:  Completed, signed off  If discussed at [place] of Stay Meetings, dates discussed:    Additional Comments: CM  met with pt, son [name] [phone] and daughter-in-law [name] [phone] in room to offer choice of home health agency.  Family chooses [facility] to render HHPT/Aide.  Pt will be going to [name], [name] and [name] or [name] are the contacts for home agency to schedule O2 setup and [facility] services.  Referral called to [facility] rep, [name].  Cm notified [place] DME rep, [name] to please deliver the 3n1, rolling walker, and O2 to room for transport home.  CM spoke with [name] who states they have been given a starter kit for Xarelto and free 30 day trial card.  No other CM needs were communicated. Dellie Catholic, RN 03/21/2016, 9:31 AM

## 2016-03-21 NOTE — Progress Notes (Signed)
Patient Name: Michele Thompson Date of Encounter: 03/21/2016     Principal Problem:   PE (pulmonary thromboembolism) ([place]) Active Problems:   Hyperlipidemia   Hypertension   Elevated troponin   Hypothyroidism   Rectal bleeding   Thrombocytopenia (HCC)   Hypoxemia   Heparin induced thrombocytopenia (HCC)   Cardiogenic shock (HCC)   Cardiac arrest (HCC)   Anoxic encephalopathy (HCC)    SUBJECTIVE  Denies chest pain or sob.   CURRENT MEDS . amLODipine  5 mg Oral Daily  . atorvastatin  80 mg Oral q1800  . cholecalciferol  1,000 Units Oral Daily  . levothyroxine  100 mcg Oral QAC breakfast  . magnesium sulfate 1 - 4 g bolus IVPB  1 g Intravenous Once  . pantoprazole  40 mg Oral Daily  . polyethylene glycol  17 g Oral BID  . rivaroxaban  15 mg Oral BID AC   Followed by  . [START ON 04/11/2016] rivaroxaban  20 mg Oral Q supper  . sodium chloride flush  3 mL Intravenous Q12H    OBJECTIVE  Vitals:   03/20/16 1345 03/20/16 1803 03/20/16 2025 03/21/16 0408  BP: 125/75  123/60 (!) 149/67  Pulse: 65  80 80  Resp: 17  18 16   Temp: 98 F (36.7 C)  98.4 F (36.9 C) 98.8 F (37.1 C)  TempSrc: Oral  Oral Oral  SpO2: 96% 99% 94% 95%  Weight:      Height:        Intake/Output Summary (Last 24 hours) at 03/21/16 1102 Last data filed at 03/21/16 1046  Gross per 24 hour  Intake              720 ml  Output             4250 ml  Net            -3530 ml   Filed Weights   03/17/16 0216 03/19/16 2000  Weight: 188 lb (85.3 kg) 202 lb 12.8 oz (92 kg)    PHYSICAL EXAM  General: Pleasant, obese 75 yo woman, NAD. Neuro: Alert and oriented X 3. Moves all extremities spontaneously. Psych: Normal affect. HEENT:  Normal  Neck: Supple without bruits or JVD. Lungs:  Resp regular and unlabored, CTA. Heart: RRR no s3, s4, or murmurs. Abdomen: Soft, non-tender, non-distended, BS + x 4.  Extremities: No clubbing, cyanosis or edema. DP/PT/Radials 2+ and equal bilaterally. Ecchymosis  is improved in the arms.  Accessory Clinical Findings  CBC  Recent Labs  03/20/16 0543 03/21/16 0259  WBC 9.4 7.5  HGB 10.4* 10.9*  HCT 31.1* 31.9*  MCV 91.7 90.9  PLT 116* Q000111Q*   Basic Metabolic Panel  Recent Labs  03/19/16 0520 03/20/16 0543 03/21/16 0259  NA 141 139 139  K 3.6 3.0* 3.9  CL 115* 108 106  CO2 19* 24 26  GLUCOSE 99 96 107*  BUN 12 10 9   CREATININE 1.06* 0.99 0.87  CALCIUM 7.3* 7.5* 8.0*  MG 2.5* 1.8 1.8  PHOS 1.8* 2.6  --    Liver Function Tests No results for input(s): AST, ALT, ALKPHOS, BILITOT, PROT, ALBUMIN in the last 72 hours. No results for input(s): LIPASE, AMYLASE in the last 72 hours. Cardiac Enzymes  Recent Labs  03/20/16 2057 03/21/16 0259 03/21/16 0849  TROPONINI 0.09* 0.08* 0.09*   BNP Invalid input(s): POCBNP D-Dimer No results for input(s): DDIMER in the last 72 hours. Hemoglobin A1C No results for input(s): HGBA1C in  the last 72 hours. Fasting Lipid Panel No results for input(s): CHOL, HDL, LDLCALC, TRIG, CHOLHDL, LDLDIRECT in the last 72 hours. Thyroid Function Tests No results for input(s): TSH, T4TOTAL, T3FREE, THYROIDAB in the last 72 hours.  Invalid input(s): FREET3  TELE nsr  Radiology/Studies  Ct Head Wo Contrast  Result Date: 03/17/2016 CLINICAL DATA:  Headache.  Hypertension. EXAM: CT HEAD WITHOUT CONTRAST TECHNIQUE: Contiguous axial images were obtained from the base of the skull through the vertex without intravenous contrast. COMPARISON:  None. FINDINGS: Brain: No evidence of acute infarction, hemorrhage, hydrocephalus, extra-axial collection or mass lesion/mass effect. Vascular: No hyperdense vessel or unexpected calcification. Skull: Normal. Negative for fracture or focal lesion. Sinuses/Orbits: No acute finding. Other: None. IMPRESSION: Negative noncontrast head CT. Electronically Signed   By: Earle Gell M.D.   On: 03/17/2016 07:17   Ct Angio Chest Pe W And/or Wo Contrast  Result Date:  03/17/2016 CLINICAL DATA:  Chronic shortness of breath. Elevated D-dimer. Patient on Estrogen patches. Initial encounter. EXAM: CT ANGIOGRAPHY CHEST WITH CONTRAST TECHNIQUE: Multidetector CT imaging of the chest was performed using the standard protocol during bolus administration of intravenous contrast. Multiplanar CT image reconstructions and MIPs were obtained to evaluate the vascular anatomy. CONTRAST:  80 mL of Isovue 370 IV contrast COMPARISON:  Chest radiograph performed 03/14/2016 FINDINGS: Cardiovascular: Bilateral pulmonary embolus is noted, extending from the main pulmonary arteries to all lobes of both lungs. The RV/LV ratio of 2.2 corresponds to significant right heart strain and at least submassive pulmonary embolus. Scattered calcification is noted along the thoracic aorta. The proximal great vessels are grossly unremarkable. Mediastinum/Nodes: The mediastinum is grossly unremarkable in appearance. No mediastinal lymphadenopathy is seen. No pericardial effusion is identified. The thyroid gland is diminutive and not well characterized. No axillary lymphadenopathy is seen. Lungs/Pleura: Mild patchy bilateral atelectasis is noted. No pleural effusion or pneumothorax is seen. No masses are identified. Upper Abdomen: The visualized portions of the liver and spleen are unremarkable. The visualized portions of the pancreas is within normal limits. Musculoskeletal: No acute osseous abnormalities are identified. Osseous fusion is noted at T7-T8. The visualized musculature is unremarkable in appearance. Review of the MIP images confirms the above findings. IMPRESSION: 1. Bilateral pulmonary embolus, extending from the main pulmonary arteries to all lobes of both lungs. Positive for acute PE with CT evidence of right heart strain (RV/LV Ratio = 2.2) consistent with at least submassive (intermediate risk) PE. The presence of right heart strain has been associated with an increased risk of morbidity and  mortality. Please activate Code PE by paging [phone]. 2. Mild patchy bilateral atelectasis noted; lungs otherwise clear. 3. Osseous fusion at T7-T8. Critical Value/emergent results were called by telephone at the time of interpretation on 03/17/2016 at 4:21 am to Dr. Pryor Curia, who verbally acknowledged these results. Electronically Signed   By: Garald Balding M.D.   On: 03/17/2016 04:29   Portable Chest Xray  Result Date: 03/18/2016 CLINICAL DATA:  Intubation . EXAM: PORTABLE CHEST 1 VIEW COMPARISON:  03/17/2016 . FINDINGS: Endotracheal tube and NG tube in stable position. Bibasilar atelectasis. Follow-up chest x-rays to demonstrate clearing suggested. Heart size normal. No pleural effusion or pneumothorax. No acute bony abnormality. IMPRESSION: 1. Lines and tubes in stable position. 2. Bibasilar atelectasis. Follow-up chest x-rays demonstrate clearing suggested. Electronically Signed   By: Marcello Moores  Register   On: 03/18/2016 07:32   Dg Chest Port 1 View  Result Date: 03/17/2016 CLINICAL DATA:  Central line placement. EXAM: PORTABLE CHEST  1 VIEW COMPARISON:  CT 03/17/2016.  Chest x-ray 03/14/2016. FINDINGS: Endotracheal tube noted with tip projected 1.8 cm above the carina. Heart size stable. Low lung volumes. No focal infiltrate. No pleural effusion or pneumothorax. No acute bony abnormality. IMPRESSION: Endotracheal tube tip noted 1.8 cm above the carina. Low lung volumes. No focal infiltrate Electronically Signed   By: Marcello Moores  Register   On: 03/17/2016 13:23   Dg Chest Port 1 View  Result Date: 03/14/2016 CLINICAL DATA:  Shortness of Breath EXAM: PORTABLE CHEST 1 VIEW COMPARISON:  None. FINDINGS: The heart size and mediastinal contours are within normal limits. Both lungs are clear. The visualized skeletal structures are unremarkable. IMPRESSION: No active disease. Electronically Signed   By: Lahoma Crocker M.D.   On: 03/14/2016 16:03    ASSESSMENT AND PLAN  1. Chest pain - resolved.  Continue [place] 2. Acute PE - she is stable at this point. She will continue [place]. Question is for how long.  3. Obesity - I discussed the importance of weight loss.  4. Disp. - agree with plans for DC home.  Carleene Overlie ,M.D.  03/21/2016 11:02 AMPatient ID: [name], female   DOB: [birth year], 75 y.o.   MRN: [id number]

## 2016-03-21 NOTE — Progress Notes (Signed)
  Echocardiogram 2D Echocardiogram has been performed.  Michele Thompson 03/21/2016, 10:11 AM

## 2016-03-21 NOTE — Progress Notes (Signed)
Order to discharge received.  Telemetry removed, CCMD notified.  No IV access.

## 2016-03-22 ENCOUNTER — Telehealth: Payer: Self-pay | Admitting: Physician Assistant

## 2016-03-22 NOTE — Telephone Encounter (Signed)
New message   12/3 pt had Echo at [facility] and he wants to know if she should keep the appts on 12/7

## 2016-03-23 NOTE — Telephone Encounter (Signed)
I don't see any appointments with cardiology on 12/07. Keep appt 12/13 with me. Thanks

## 2016-03-23 NOTE — Telephone Encounter (Signed)
i've spoken w son [name], patient Michele Thompson, pt has also given verbal OK to discuss her care. He notes she's doing well since hospital, she had to move appt as she is staying in [place] w family for next few days. Aware to call if concerns or new problems in advance of appt.  Patient contacted regarding discharge from [facility] on 12/3.  Patient understands to follow up with provider Richardson Dopp, PA on 03/31/16 at 11:30 at [facility] (for monitor placement and visit w PA. Patient understands discharge instructions? Yes Patient understands medications and regimen? Yes Patient understands to bring all medications to this visit? Yes

## 2016-03-25 ENCOUNTER — Encounter: Payer: Self-pay | Admitting: Physician Assistant

## 2016-03-25 ENCOUNTER — Other Ambulatory Visit (HOSPITAL_COMMUNITY): Payer: Self-pay

## 2016-03-29 ENCOUNTER — Telehealth: Payer: Self-pay | Admitting: Hematology

## 2016-03-29 NOTE — Telephone Encounter (Signed)
Pt's son cld to schedule an appt for his mother. Appt has been scheduled w/K[facility] on 1/17 at 2pm. Aware to arrive 30 minutes early. Demographics verified.

## 2016-03-31 ENCOUNTER — Other Ambulatory Visit: Payer: Self-pay | Admitting: Gastroenterology

## 2016-03-31 ENCOUNTER — Ambulatory Visit: Payer: Self-pay | Admitting: Physician Assistant

## 2016-03-31 ENCOUNTER — Encounter: Payer: Self-pay | Admitting: Physician Assistant

## 2016-03-31 ENCOUNTER — Other Ambulatory Visit: Payer: Self-pay | Admitting: *Deleted

## 2016-03-31 ENCOUNTER — Ambulatory Visit (INDEPENDENT_AMBULATORY_CARE_PROVIDER_SITE_OTHER): Payer: Medicare Other | Admitting: Physician Assistant

## 2016-03-31 ENCOUNTER — Ambulatory Visit (INDEPENDENT_AMBULATORY_CARE_PROVIDER_SITE_OTHER): Payer: Medicare Other

## 2016-03-31 VITALS — BP 130/60 | HR 50 | Ht 62.0 in | Wt 185.0 lb

## 2016-03-31 DIAGNOSIS — I2699 Other pulmonary embolism without acute cor pulmonale: Secondary | ICD-10-CM | POA: Diagnosis not present

## 2016-03-31 DIAGNOSIS — D696 Thrombocytopenia, unspecified: Secondary | ICD-10-CM

## 2016-03-31 DIAGNOSIS — R002 Palpitations: Secondary | ICD-10-CM

## 2016-03-31 DIAGNOSIS — I2510 Atherosclerotic heart disease of native coronary artery without angina pectoris: Secondary | ICD-10-CM

## 2016-03-31 DIAGNOSIS — E784 Other hyperlipidemia: Secondary | ICD-10-CM

## 2016-03-31 DIAGNOSIS — I10 Essential (primary) hypertension: Secondary | ICD-10-CM

## 2016-03-31 DIAGNOSIS — I25111 Atherosclerotic heart disease of native coronary artery with angina pectoris with documented spasm: Secondary | ICD-10-CM | POA: Insufficient documentation

## 2016-03-31 DIAGNOSIS — R001 Bradycardia, unspecified: Secondary | ICD-10-CM

## 2016-03-31 DIAGNOSIS — E7849 Other hyperlipidemia: Secondary | ICD-10-CM

## 2016-03-31 DIAGNOSIS — R1013 Epigastric pain: Secondary | ICD-10-CM

## 2016-03-31 HISTORY — DX: Bradycardia, unspecified: R00.1

## 2016-03-31 MED ORDER — CARVEDILOL 3.125 MG PO TABS: 3.1250 mg | ORAL_TABLET | Freq: Two times a day (BID) | ORAL | 3 refills | Status: DC

## 2016-03-31 NOTE — Progress Notes (Signed)
Cardiology Office Note:    Date:  03/31/2016   ID:  Michele Thompson, DOB [birth year], MRN [id number]  PCP:  Tawanna Solo, MD  Cardiologist:  Dr. Sanda Klein   Electrophysiologist:  N/a GI: Dr. Amedeo Plenty   Referring MD: Kathyrn Lass, MD   Chief Complaint  Patient presents with  . Hospitalization Follow-up    1. Chest pain, elevated Troponin, s/p Cath;  2. Bilateral Submassive Pulmonary Embolus c/b PEA arrest    History of Present Illness:    Michele Thompson is a 75 y.o. female with a hx of HTN, hypothyroidism.  Admitted 11/26-11/28 with chest pain concerning for angina with mildly elevated Troponin levels.  She underwent LHC which demonstrated mild non-obs CAD with mLAD 30% stenosis.  She was placed on calcium channel blocker for possible microvascular angina.    Readmitted 11/29-12/3 with bilateral, submassive pulmonary embolus and right heart strain complicated by PEA arrest. She was administered CPR and ACLS with ROSC.  She had reported She had severe thrombocytopenia upon admission raising concern for heparin-induced thrombocytopenia.  She was placed on IV Argatroban for several days.  Venous Duplex was positive for right lower extremity and right upper extremity DVT.  HIT antibody was neg.  She was ultimately transitioned to oral Xarelto.  FU echocardiogram demonstrated normal RV function.    She returns for follow-up. She is here with her son and her son's mother-in-law. She is working with home health physical therapy. She is staying with her son in [place]. She was originally set up for a Holter monitor by her primary care physician before her initial admission on 11/26. The family apparently spoke to someone at our office who told him that Dr. Irish Lack (rounding physician in the hospital) wanted a 30 day event monitor. There is no documentation of this in the chart. Since DC from the hospital, she notes that she has been weak. She has been short of breath with minimal activities. This  is gradually improving. She denies orthopnea or PND. She's had some mild pedal edema that seems to be improving. She denies chest discomfort. She denies syncope. She's had a nonproductive cough. This is improved. She has noted some nausea but denies vomiting. She denies any bleeding issues. Of note, she has had some abdominal pain. This seems to be linked with her shortness of breath. She has worsening abdominal pain after meals. She is going to see her gastroenterologist later today.  Prior CV studies that were reviewed today include:    Echo 03/21/16 Mild LVH, EF 60-65, no RWMA, Gr 1 DD, MAC, normal RVSF  UE Venous Duplex 03/17/16 Right upper extremity is positive for subvalvular hyperacute platelet aggregation versus mobile deep vein thrombosis involving the right axillary vein. There is rouleaux flow in the right subclavian and brachial veins.  Venous duplex 03/17/16 Findings consistent with acute deep vein thrombosis involving the right common femoral vein, right femoral vein, right posterial tibial vein, and right peroneal vein.  LHC 03/15/16 LAD mid 30 EF 55-65 1. Mild non-obstructive CAD, mid-LAD irregularity 2. Widely patent left main, left circumflex, and RCA 3. Normal LV function  No clear etiology to explain exertional or resting angina. The patient may have  microvascular angina. A trial of Ca++ Channel blockade may be reasonable.  Past Medical History:  Diagnosis Date  . Coronary artery disease 03/16/2016   a. admx with angina and mildly elevated Troponin >> LHC: mLAD 30 >> CCB started for poss microvascular angina  . Hyperlipidemia 03/16/2016  .  Hypertension   . PE (pulmonary thromboembolism) ([place]) 03/17/2016   Bilateral submassive pulmonary embolus with RUE and RLE DVT on venous duplex 02/2016 c/b PEA arrest // Echo 03/21/16: mild LVH, EF 60-65, no RWMA, Gr 1 DD, normal RVSF  . Sinus bradycardia 03/31/2016  . Thrombocytopenia ([place]) 02/2016   Profound thrombocytopenia  noted upon admission for pulmonary embolism >> initially thought to be from Hep induced thrombocytopenia given recent admit for cardiac cath and Heparin use // However, HIT Ab was neg (0.184) // Hematology consult pending  . Thyroid disease     Past Surgical History:  Procedure Laterality Date  . CARDIAC CATHETERIZATION N/A 03/15/2016   Procedure: Left Heart Cath and Coronary Angiography;  Surgeon: [name], MD;  Location: [facility];  Service: Cardiovascular;  Laterality: N/A;  . VAGINAL HYSTERECTOMY  1983   endometriosis    Current Medications: Current Meds  Medication Sig  . amLODipine (NORVASC) 5 MG tablet Take 1 tablet (5 mg total) by mouth daily.  Marland Kitchen atorvastatin (LIPITOR) 80 MG tablet Take 1 tablet (80 mg total) by mouth daily at 6 PM.  . Cholecalciferol (VITAMIN D PO) Take by mouth.  . docusate sodium (COLACE) 100 MG capsule Take 1 capsule (100 mg total) by mouth 2 (two) times daily as needed for mild constipation.  . hydrochlorothiazide (MICROZIDE) 12.5 MG capsule Take 12.5 mg by mouth daily.  Marland Kitchen levothyroxine (SYNTHROID, LEVOTHROID) 100 MCG tablet Take 100 mcg by mouth daily before breakfast.  . lisinopril (PRINIVIL,ZESTRIL) 40 MG tablet Take 1 tablet (40 mg total) by mouth daily.  . ondansetron (ZOFRAN) 4 MG tablet Take 1 tablet (4 mg total) by mouth every 8 (eight) hours as needed for nausea or vomiting.  . polyethylene glycol (MIRALAX / GLYCOLAX) packet Take 17 g by mouth daily.  . Rivaroxaban (XARELTO) 15 MG TABS tablet Take 1 tablet (15 mg total) by mouth 2 (two) times daily before a meal.  . [START ON 04/11/2016] rivaroxaban (XARELTO) 20 MG TABS tablet Take 1 tablet (20 mg total) by mouth daily with supper.  . [DISCONTINUED] carvedilol (COREG) 6.25 MG tablet Take 1 tablet (6.25 mg total) by mouth 2 (two) times daily with a meal.     Allergies:   Heparin   Social History   Social History  . Marital status: Widowed    Spouse name: N/A  . Number of  children: N/A  . Years of education: N/A   Social History Main Topics  . Smoking status: Never Smoker  . Smokeless tobacco: Never Used  . Alcohol use No  . Drug use: No  . Sexual activity: No     Comment: 1st intercourse 75 yo-Fewer than 5 partners   Other Topics Concern  . None   Social History Narrative   Financial controller Pulmonary:   Originally from [place]. Moved to [place] in 2012. 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     Family History:  The patient's family history includes Heart failure in her father, maternal grandmother, and mother; Myasthenia gravis in her brother; Stroke in her brother.   ROS:   Please see the history of present illness.    Review of Systems  HENT: Positive for hearing loss.   Cardiovascular: Positive for dyspnea on exertion and leg swelling.  Respiratory: Positive for cough and shortness of breath.   Hematologic/Lymphatic: Bruises/bleeds easily.  Gastrointestinal: Positive for abdominal pain and nausea.  Neurological: Positive for dizziness, headaches and loss of  balance.   All other systems reviewed and are negative.   EKGs/Labs/Other Test Reviewed:    EKG:  EKG is  ordered today.  The ekg ordered today demonstrates Sinus bradycardia, HR 51, normal axis, T-wave inversions in 2, 3, aVF, V3-before, QTc 416 ms, no change from prior tracing  Recent Labs: 03/17/2016: B Natriuretic Peptide 357.2 03/21/2016: BUN 9; Creatinine, Ser 0.87; Hemoglobin 10.9; Magnesium 1.8; Platelets 134; Potassium 3.9; Sodium 139   Recent Lipid Panel    Component Value Date/Time   CHOL 142 03/15/2016 0524   TRIG 73 03/15/2016 0524   HDL 44 03/15/2016 0524   CHOLHDL 3.2 03/15/2016 0524   VLDL 15 03/15/2016 0524   LDLCALC 83 03/15/2016 0524     Physical Exam:    VS:  BP 130/60   Pulse (!) 50   Ht 5\' 2"  (1.575 m)   Wt 185 lb (83.9 kg)   BMI 33.84 kg/m     Wt Readings from Last 3 Encounters:  03/31/16 185 lb  (83.9 kg)  03/19/16 202 lb 12.8 oz (92 kg)  03/14/16 188 lb (85.3 kg)     Physical Exam  Constitutional: She is oriented to person, place, and time. She appears well-developed and well-nourished. No distress.  HENT:  Head: Normocephalic and atraumatic.  Eyes: No scleral icterus.  Neck: No JVD present.  Cardiovascular: Normal rate, regular rhythm and normal heart sounds.   No murmur heard. Pulmonary/Chest: Effort normal. She has no wheezes. She has no rales.  Abdominal: Soft. There is no tenderness.  Musculoskeletal: She exhibits no edema.  Neurological: She is alert and oriented to person, place, and time.  Skin: Skin is warm and dry.  Psychiatric: She has a normal mood and affect.    ASSESSMENT:    1. PE (pulmonary thromboembolism) ([place])   2. Coronary artery disease involving native coronary artery of native heart without angina pectoris   3. Essential hypertension   4. Other hyperlipidemia   5. Sinus bradycardia   6. Thrombocytopenia ([place])    PLAN:    In order of problems listed above:  1. Pulmonary Embolism - s/p recent admit for bilateral, submassive PE 2/2 RUE and RLE DVT c/b PEA arrest with evidence of R heart strain on diagnostic Chest CTA.  Her hospital stay was c/b thrombocytopenia with concerns for HIT with thrombosis.  However, HIT test was negative.  She is due to see Heme in the future for further workup.  Fortunately, her Echo demonstrated normal RV function.  She is progressing slowly with HHPT.  Her breathing is slowly improving.  She does have some abdominal discomfort at times and this seems to be associated with her dyspnea.  There was a question of rectal bleeding at the time of her admission and she sees her gastroenterologist later today.  She has not seen Pulmonology yet and does not have any upcoming appointments.  -  Continue Xarelto  -  Arrange follow up with Pulmonology for post hospital visit  2. CAD - No significant CAD at recent [facility].  She did have  symptoms of chest pain c/w angina.  She denies recurrent symptoms on Amlodipine.  Question if she has microvascular angina vs chest pain 2/2 pulmonary embolism that contributed to her presentation with elevated Troponin levels, etc.  Continue statin, Amlodipine, beta-blocker.  She is not on ASA as she is on Xarelto.  3. HTN - Controlled.  4. HL - Continue statin.    5. Sinus bradycardia - Her HR is  in the low 50s.  She notes this is not unusual for her but is a little lower than her typical.  She was to have a Holter monitor but, apparently, someone from our office noted that she was to have an event monitor.  I reviewed her available Tele from the hospital.  There were no arrhythmias.  I do not think she needs an event monitor.  But, I will go ahead and have her wear the Holter.  -  Decrease Coreg to 3.125 bid  -  Monitor BP  -  Holter x 48 hours  6. Thrombocytopenia - As noted she had HIT panel in the hospital that was normal. There was some concern for heparin-induced thrombocytopenia. Her platelet count improved prior to DC. She does have follow-up with hematology pending.  Total time spent with patient today 45 minutes. This includes reviewing records, evaluating the patient and coordinating care. Face-to-face time >50%.   Medication Adjustments/Labs and Tests Ordered: Current medicines are reviewed at length with the patient today.  Concerns regarding medicines are outlined above.  Medication changes, Labs and Tests ordered today are outlined in the Patient Instructions noted below. Patient Instructions  Medication Instructions:  1. DECREASE COREG 3.125 MG TWICE DAILY; NEW RX HAS  BEEN SENT IN  Labwork: NONE  Testing/Procedures: 48 HOUR HOLTER MONITOR TODAY  Follow-Up: DR. Sallyanne Kuster 3-4 WEEKS  YOU ARE BEING REFERRED TO PULMONOLOGY  Any Other Special Instructions Will Be Listed Below (If Applicable).  CALL IF BP IS ABOVE 150/90 [phone]  If you need a refill on your cardiac  medications before your next appointment, please call your pharmacy.   Signed, Richardson Dopp, PA-C  03/31/2016 3:54 PM    [facility] [address], [place], [place]  [address] Phone: [phone]; Fax: [phone]

## 2016-03-31 NOTE — Patient Instructions (Addendum)
Medication Instructions:  1. DECREASE COREG 3.125 MG TWICE DAILY; NEW RX HAS  BEEN SENT IN  Labwork: NONE  Testing/Procedures: 48 HOUR HOLTER MONITOR TODAY  Follow-Up: DR. Sallyanne Kuster 3-4 WEEKS  YOU ARE BEING REFERRED TO PULMONOLOGY  Any Other Special Instructions Will Be Listed Below (If Applicable).  CALL IF BP IS ABOVE 150/90 [phone]  If you need a refill on your cardiac medications before your next appointment, please call your pharmacy.

## 2016-04-02 ENCOUNTER — Other Ambulatory Visit: Payer: Self-pay

## 2016-04-07 ENCOUNTER — Ambulatory Visit
Admission: RE | Admit: 2016-04-07 | Discharge: 2016-04-07 | Disposition: A | Discharge status: Home / Self Care | Payer: Medicare Other | Source: Ambulatory Visit | Attending: Gastroenterology | Admitting: Gastroenterology

## 2016-04-07 DIAGNOSIS — R1013 Epigastric pain: Secondary | ICD-10-CM

## 2016-04-09 ENCOUNTER — Other Ambulatory Visit: Payer: Self-pay

## 2016-04-09 ENCOUNTER — Telehealth: Payer: Self-pay | Admitting: Physician Assistant

## 2016-04-09 MED ORDER — RIVAROXABAN 20 MG PO TABS: 20.0000 mg | ORAL_TABLET | Freq: Every day | ORAL | 0 refills | Status: DC

## 2016-04-09 NOTE — Telephone Encounter (Signed)
°*  STAT* If patient is at the pharmacy, call can be transferred to refill team.   1. Which medications need to be refilled? (please list name of each medication and dose if known)  Xarelto 20 mg-pt thinks this will call for a prior authorization for her insurance to approve her Xarelto  2. Which pharmacy/location (including street and city if local pharmacy) is medication to be sent to?[phone]  3. Do they need a 30 day or 90 day supply? 30 and refills

## 2016-04-13 ENCOUNTER — Telehealth: Payer: Self-pay | Admitting: *Deleted

## 2016-04-13 ENCOUNTER — Other Ambulatory Visit: Payer: Self-pay | Admitting: Cardiovascular Disease

## 2016-04-13 MED ORDER — RIVAROXABAN 20 MG PO TABS: 20.0000 mg | ORAL_TABLET | Freq: Every day | ORAL | 1 refills | Status: DC

## 2016-04-13 NOTE — Telephone Encounter (Signed)
PA for Xarelto started through cover my meds. [id number]

## 2016-04-14 ENCOUNTER — Other Ambulatory Visit: Payer: Self-pay

## 2016-04-14 MED ORDER — RIVAROXABAN 20 MG PO TABS: 20.0000 mg | ORAL_TABLET | Freq: Every day | ORAL | 10 refills | Status: DC

## 2016-04-22 ENCOUNTER — Encounter: Payer: Self-pay | Admitting: Adult Health

## 2016-04-22 ENCOUNTER — Ambulatory Visit (INDEPENDENT_AMBULATORY_CARE_PROVIDER_SITE_OTHER): Payer: Medicare Other | Admitting: Adult Health

## 2016-04-22 VITALS — BP 126/76 | HR 80 | Ht 62.0 in | Wt 182.0 lb

## 2016-04-22 DIAGNOSIS — D696 Thrombocytopenia, unspecified: Secondary | ICD-10-CM

## 2016-04-22 DIAGNOSIS — I2699 Other pulmonary embolism without acute cor pulmonale: Secondary | ICD-10-CM | POA: Diagnosis not present

## 2016-04-22 DIAGNOSIS — I82492 Acute embolism and thrombosis of other specified deep vein of left lower extremity: Secondary | ICD-10-CM | POA: Diagnosis not present

## 2016-04-22 NOTE — Patient Instructions (Signed)
Continue on Xarelto 20mg  daily .  Avoid NSAID products  Follow up with Hematology this month as planned  Follow up Dr. Ashok Cordia in 3 months and As needed

## 2016-04-22 NOTE — Progress Notes (Signed)
@Patient  ID: Michele Thompson, female    DOB: December 07, 1940, 76 y.o.   MRN: [id number]  Chief Complaint  Patient presents with  . Follow-up    hosp follow up     Referring provider: Kathyrn Lass, MD  HPI: 76 yo female seen for PCCM consult during hospitalization for bilateral submassive PE w/ RHS complicated by PEA arrest . She required CPR.. Venous doppler showed R lower leg and RU extremity DVT . 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 Tx w/ IV Argatroban for several days then discharged on Xarelto.  She had been admitted recently for for chest pain with LHC showing nonobstructive CAD. Repeat echo showed nm EF , Gr 1 DD and nml RV Since discharge she is feeling better. Remains somewhat weak but regaining her strength. Son is with her today.  Does get winded , but dyspnea is getting better. Now can take in deep breath.  No previous hx of VTE .  Pt and son have many questions, one of their concerns is that is did she have the PE during the first admission and therefore this would be considered an unprovoked clot.  They have been referred to hematology and ov is pending later this month    Allergies  Allergen Reactions  . Heparin     HIT panel pending    Immunization History  Administered Date(s) Administered  . Influenza-Unspecified 03/29/2016    Past Medical History:  Diagnosis Date  . Coronary artery disease 03/16/2016   a. admx with angina and mildly elevated Troponin >> LHC: mLAD 30 >> CCB started for poss microvascular angina  . Hyperlipidemia 03/16/2016  . Hypertension   . PE (pulmonary thromboembolism) ([place]) 03/17/2016   Bilateral submassive pulmonary embolus with RUE and RLE DVT on venous duplex 02/2016 c/b PEA arrest // Echo 03/21/16: mild LVH, EF 60-65, no RWMA, Gr 1 DD, normal RVSF  . Sinus bradycardia 03/31/2016  . Thrombocytopenia ([place]) 02/2016   Profound thrombocytopenia noted upon admission for pulmonary embolism >> initially thought to  be from Hep induced thrombocytopenia given recent admit for cardiac cath and Heparin use // However, HIT Ab was neg (0.184) // Hematology consult pending  . Thyroid disease     Tobacco History: History  Smoking Status  . Never Smoker  Smokeless Tobacco  . Never Used   Counseling given: Not Answered   Outpatient Encounter Prescriptions as of 04/22/2016  Medication Sig  . amLODipine (NORVASC) 5 MG tablet Take 1 tablet (5 mg total) by mouth daily.  Marland Kitchen atorvastatin (LIPITOR) 80 MG tablet Take 1 tablet (80 mg total) by mouth daily at 6 PM.  . Cholecalciferol (VITAMIN D PO) Take by mouth.  . docusate sodium (COLACE) 100 MG capsule Take 1 capsule (100 mg total) by mouth 2 (two) times daily as needed for mild constipation.  Marland Kitchen esomeprazole (NEXIUM) 20 MG packet Take 20 mg by mouth daily before breakfast.  . hydrochlorothiazide (MICROZIDE) 12.5 MG capsule Take 12.5 mg by mouth daily.  Marland Kitchen levothyroxine (SYNTHROID, LEVOTHROID) 100 MCG tablet Take 100 mcg by mouth daily before breakfast.  . lisinopril (PRINIVIL,ZESTRIL) 40 MG tablet Take 1 tablet (40 mg total) by mouth daily.  . ondansetron (ZOFRAN) 4 MG tablet Take 1 tablet (4 mg total) by mouth every 8 (eight) hours as needed for nausea or vomiting.  . rivaroxaban (XARELTO) 20 MG TABS tablet Take 1 tablet (20 mg total) by mouth daily with supper.  . senna (SENOKOT) 8.6  MG tablet Take 1 tablet by mouth 2 (two) times daily as needed for constipation.  . carvedilol (COREG) 3.125 MG tablet Take 1 tablet (3.125 mg total) by mouth 2 (two) times daily. (Patient not taking: Reported on 04/22/2016)  . polyethylene glycol (MIRALAX / GLYCOLAX) packet Take 17 g by mouth daily. (Patient not taking: Reported on 04/22/2016)   No facility-administered encounter medications on file as of 04/22/2016.      Review of Systems  Constitutional:   No  weight loss, night sweats,  Fevers, chills,  +fatigue, or  lassitude.  HEENT:   No headaches,  Difficulty swallowing,   Tooth/dental problems, or  Sore throat,                No sneezing, itching, ear ache, nasal congestion, post nasal drip,   CV:  No chest pain,  Orthopnea, PND, swelling in lower extremities, anasarca, dizziness, palpitations, syncope.   GI  No heartburn, indigestion, abdominal pain, nausea, vomiting, diarrhea, change in bowel habits, loss of appetite, bloody stools.   Resp: No shortness of breath with exertion or at rest.  No excess mucus, no productive cough,  No non-productive cough,  No coughing up of blood.  No change in color of mucus.  No wheezing.  No chest wall deformity  Skin: no rash or lesions.  GU: no dysuria, change in color of urine, no urgency or frequency.  No flank pain, no hematuria   MS:  No joint pain or swelling.  No decreased range of motion.  No back pain.    Physical Exam  BP 126/76 (BP Location: Left Arm, Patient Position: Sitting, Cuff Size: Normal)   Pulse 80   Ht 5\' 2"  (1.575 m)   Wt 182 lb (82.6 kg)   SpO2 96%   BMI 33.29 kg/m   GEN: A/Ox3; pleasant , NAD, elderly    HEENT:  [place]/AT,  EACs-clear, TMs-wnl, NOSE-clear, THROAT-clear, no lesions, no postnasal drip or exudate noted.   NECK:  Supple w/ fair ROM; no JVD; normal carotid impulses w/o bruits; no thyromegaly or nodules palpated; no lymphadenopathy.    RESP  Clear  P & A; w/o, wheezes/ rales/ or rhonchi. no accessory muscle use, no dullness to percussion  CARD:  RRR, no m/r/g, no peripheral edema, pulses intact, no cyanosis or clubbing.  GI:   Soft & nt; nml bowel sounds; no organomegaly or masses detected.   Musco: Warm bil, no deformities or joint swelling noted.   Neuro: alert, no focal deficits noted.    Skin: Warm, no lesions or rashes  Psych:  No change in mood or affect. No depression or anxiety.  No memory loss.  Lab Results:  CBC    Component Value Date/Time   WBC 7.5 03/21/2016 0259   RBC 3.51 (L) 03/21/2016 0259   HGB 10.9 (L) 03/21/2016 0259   HCT 31.9 (L) 03/21/2016  0259   PLT 134 (L) 03/21/2016 0259   MCV 90.9 03/21/2016 0259   MCH 31.1 03/21/2016 0259   MCHC 34.2 03/21/2016 0259   RDW 12.7 03/21/2016 0259   LYMPHSABS 2.2 03/17/2016 0240   MONOABS 0.3 03/17/2016 0240   EOSABS 0.1 03/17/2016 0240   BASOSABS 0.0 03/17/2016 0240    BMET    Component Value Date/Time   NA 139 03/21/2016 0259   K 3.9 03/21/2016 0259   CL 106 03/21/2016 0259   CO2 26 03/21/2016 0259   GLUCOSE 107 (H) 03/21/2016 0259   BUN 9 03/21/2016 0259  CREATININE 0.87 03/21/2016 0259   CALCIUM 8.0 (L) 03/21/2016 0259   GFRNONAA >60 03/21/2016 0259   GFRAA >60 03/21/2016 0259    BNP    Component Value Date/Time   BNP 357.2 (H) 03/17/2016 0534    ProBNP No results found for: PROBNP  Imaging: US Abdomen Limited Ruq  Result Date: 04/07/2016 CLINICAL DATA:  Epigastric abdominal pain EXAM: US ABDOMEN LIMITED - RIGHT UPPER QUADRANT COMPARISON:  None. FINDINGS: Gallbladder: No gallstones or wall thickening visualized. No sonographic Murphy sign noted by sonographer. Common bile duct: Diameter: 6.8 mm Liver: No focal lesion identified. Increased hepatic parenchymal echogenicity. IMPRESSION: 1. No cholelithiasis or sonographic evidence of acute cholecystitis. 2. Increased hepatic echogenicity as can be seen with hepatic steatosis. Electronically Signed   By: Kathreen Devoid   On: 04/07/2016 15:14     Assessment & Plan:   No problem-specific Assessment & Plan notes found for this encounter.     Rexene Edison, NP 04/23/2016

## 2016-04-23 DIAGNOSIS — Z86718 Personal history of other venous thrombosis and embolism: Secondary | ICD-10-CM | POA: Insufficient documentation

## 2016-04-23 NOTE — Assessment & Plan Note (Addendum)
Bilateral submassive PE w/ RUE/RLE DVT c/b PEA arrest (right heart strain on CT chest ) it appeared to provoked from recent hospitalization . Initially thought to be HIT with thrombosis however HIT ab was neg and felt thrombocytopenia with from large clot burden w/ consumption.  This is a very complicated patient . She has had a massive clot with extensive DVT in RLE as well as a RUE DVT she would need at least 6-12 month of anticoagulation for a a provoked VTE.  She has been referred to hematology for further evaluation and appreciate their guidance.  She appears to be doing well on Xarelto  She also has some chronic GI issues and is following with GI . No known malignancies . Colon >10 yr ago.   Plan  Patient Instructions  Continue on Xarelto 20mg  daily .  Avoid NSAID products  Follow up with Hematology this month as planned  Follow up Dr. Ashok Cordia in 3 months and As needed

## 2016-04-23 NOTE — Assessment & Plan Note (Signed)
HIT ab was neg at 0.184 .  plt improving  follow up hematology .

## 2016-04-23 NOTE — Assessment & Plan Note (Signed)
Cont on Xarelto  

## 2016-04-26 NOTE — Progress Notes (Signed)
Note reviewed.  Sonia Baller Ashok Cordia, M.D. [facility] Pager:  [phone] After 3pm or if no response, call [phone] 8:39 AM 04/26/16

## 2016-04-28 ENCOUNTER — Ambulatory Visit (INDEPENDENT_AMBULATORY_CARE_PROVIDER_SITE_OTHER): Payer: Medicare Other | Admitting: Cardiology

## 2016-04-28 DIAGNOSIS — D696 Thrombocytopenia, unspecified: Secondary | ICD-10-CM | POA: Diagnosis not present

## 2016-04-28 DIAGNOSIS — I10 Essential (primary) hypertension: Secondary | ICD-10-CM

## 2016-04-28 DIAGNOSIS — I2699 Other pulmonary embolism without acute cor pulmonale: Secondary | ICD-10-CM | POA: Diagnosis not present

## 2016-04-28 DIAGNOSIS — I82401 Acute embolism and thrombosis of unspecified deep veins of right lower extremity: Secondary | ICD-10-CM

## 2016-04-28 DIAGNOSIS — I2510 Atherosclerotic heart disease of native coronary artery without angina pectoris: Secondary | ICD-10-CM | POA: Diagnosis not present

## 2016-04-28 MED ORDER — ONDANSETRON HCL 4 MG PO TABS: 4.0000 mg | ORAL_TABLET | Freq: Three times a day (TID) | ORAL | 1 refills | Status: DC | PRN

## 2016-04-28 NOTE — Assessment & Plan Note (Signed)
Bilateral submassive pulmonary embolus with RUE and RLE DVT on venous duplex 02/2016 c/b PEA arrest // Echo 03/21/16: mild LVH, EF 60-65, no RWMA, Gr 1 DD, normal RVSF

## 2016-04-28 NOTE — Assessment & Plan Note (Signed)
Mild CAD at cath 03/14/16 

## 2016-04-28 NOTE — Assessment & Plan Note (Signed)
RUE, RLE DVT Dec 2017

## 2016-04-28 NOTE — Progress Notes (Signed)
04/28/2016 Michele Thompson   March 21, 1941  [id number]  Primary Physician Tawanna Solo, MD Primary Cardiologist: Dr Sallyanne Kuster  HPI:  Michele Thompson is a pleasant 76 y/o female seen in the office today as a post hospital follow up. Her son Michele Thompson accompanied her. Michele Thompson first presented 03/14/16 to the ED at [facility] with complaints of dyspnea and exertional throat tightening concerning for angina. Her EKG was abnormal with TWI in V3-V4 and her Troponin was slightly elevated-0.57. She was admitted with a diagnosis of NSTEMI and started on Heparin, ASA, statin, and beta blocker. Her platelet count on admission was 229. She underwent diagnostic cath 03/15/16 and this revealed mild non obstructive CAD with normal LVF. Given her dyspnea, risk for PE was considered but she did not have any concerning risk factors and her pain was felt to be caused by vasospasm. Also, she was noted to have poor blood pressure control prior to admission. Norvasc was added for and she was discharged later on the 28th. Per documentation patient was able to ambulate without significant dyspnea on the day of discharge.  She was re admitted through the ED 03/17/16 with acute SOB and chest CTA done was positive for bilateral pulmonary embolism. She had also noted some rectal bleeding, though her Hgb was stable. She was seen in  Consult by by Dr Ashok Cordia with pulmonary. Also noted on admission was a platelet count of 34k.  The significant thrombocytopenia raised the concern for HIT. The plan was for systemic anticoagulation utilizing an argatroban infusion and to monitor the patient closely for signs of further bleeding seen or unseen in the intensive care unit. Given the severity of her thrombocytopenia Dr Ashok Cordia felt the risk of hemorrhage was too great from systemic or catheter directed thrombolytic therapy at that time. Interestingly a repeat platelet count 3 hours later was 151k. Later on the 29th she had a PEA arrest. ROSC was obtained with  CPR and ALS protocol. Bedside echo showed no pericardial effusion, hyperdynamic LV function and a dilated hypo contractile RV. She was started on vigorous IV hydration with normal saline. A central line was placed. Currently intubated and sedated. Based on her history of PE and evidence of Rt heart strain on echo decision was made to give TNKase.  After TNKase, more stable ROSC was established and patient began improving from a hemodynamic standpoint. She was extubated on the 30th. She was continued on Argatroban and transition to oral anticoagulation. Venous doppler were positive for Rt axillary vein thrombosis and LE dopplers also reportedly positive for DVT though I am not able to access that result today. HIT was 0.184. Her Platelet count remained stable. She was ultimately discharged 03/21/16.  Since discharge she says she has felt well, no bleeding, no unusual dyspnea. She admits to being a little weak overall. She and her son had several questions about her hospital course and I reviewed her records in detail with them. I essentially told them it was unclear weather or not she had a pulmonary embolism that cause her dyspnea before admission or weather she had an acute pulmonary embolism caused by HIT, (though she only had one platelet count less than 100k). She has followed up with pulmonary and is to see a hematologist soon.        Current Outpatient Prescriptions  Medication Sig Dispense Refill  . amLODipine (NORVASC) 5 MG tablet Take 1 tablet (5 mg total) by mouth daily. 30 tablet 6  . atorvastatin (LIPITOR) 80 MG  tablet Take 1 tablet (80 mg total) by mouth daily at 6 PM. 30 tablet 6  . Cholecalciferol (VITAMIN D PO) Take by mouth.    . docusate sodium (COLACE) 100 MG capsule Take 1 capsule (100 mg total) by mouth 2 (two) times daily as needed for mild constipation. 60 capsule 0  . esomeprazole (NEXIUM) 20 MG packet Take 20 mg by mouth daily before breakfast.    . hydrochlorothiazide  (MICROZIDE) 12.5 MG capsule Take 12.5 mg by mouth daily.    Marland Kitchen levothyroxine (SYNTHROID, LEVOTHROID) 100 MCG tablet Take 100 mcg by mouth daily before breakfast.    . lisinopril (PRINIVIL,ZESTRIL) 40 MG tablet Take 1 tablet (40 mg total) by mouth daily. 30 tablet 6  . ondansetron (ZOFRAN) 4 MG tablet Take 1 tablet (4 mg total) by mouth every 8 (eight) hours as needed for nausea or vomiting. 30 tablet 0  . rivaroxaban (XARELTO) 20 MG TABS tablet Take 1 tablet (20 mg total) by mouth daily with supper. 30 tablet 10  . senna (SENOKOT) 8.6 MG tablet Take 1 tablet by mouth 2 (two) times daily as needed for constipation.     No current facility-administered medications for this visit.     Allergies  Allergen Reactions  . Heparin     HIT panel pending    Social History   Social History  . Marital status: Widowed    Spouse name: N/A  . Number of children: N/A  . Years of education: N/A   Occupational History  . Not on file.   Social History Main Topics  . Smoking status: Never Smoker  . Smokeless tobacco: Never Used  . Alcohol use No  . Drug use: No  . Sexual activity: No     Comment: 1st intercourse 76 yo-Fewer than 5 partners   Other Topics Concern  . Not on file   Social History Narrative   [facility]:   Originally from [place]. Moved to [place] in 2012. 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     Review of Systems: General: negative for chills, fever, night sweats or weight changes.  Cardiovascular: negative for chest pain, dyspnea on exertion, edema, orthopnea, palpitations, paroxysmal nocturnal dyspnea or shortness of breath Dermatological: negative for rash Respiratory: negative for cough or wheezing Urologic: negative for hematuria Abdominal: negative for nausea, vomiting, diarrhea, bright red blood per rectum, melena, or hematemesis Neurologic: negative for visual changes, syncope, or dizziness All other  systems reviewed and are otherwise negative except as noted above.    Blood pressure 118/68, pulse 73, height 5\' 2"  (1.575 m), weight 181 lb (82.1 kg).  General appearance: alert, cooperative and no distress Neck: no carotid bruit and no JVD Lungs: clear to auscultation bilaterally Heart: regular rate and rhythm Skin: Skin color, texture, turgor normal. No rashes or lesions Neurologic: Grossly normal   ASSESSMENT AND PLAN:   PE (pulmonary thromboembolism) ([facility]) Bilateral submassive pulmonary embolus with RUE and RLE DVT on venous duplex 02/2016 c/b PEA arrest // Echo 03/21/16: mild LVH, EF 60-65, no RWMA, Gr 1 DD, normal RVSF  DVT (deep venous thrombosis) ([place]) RUE, RLE DVT Dec 2017  Coronary artery disease involving native heart without angina pectoris Mild CAD at cath 03/14/16  Thrombocytopenia ([place]) Profound thrombocytopenia noted upon admission for pulmonary embolism -one platelet count of 34k- subsequent platelet counts 100-150>> initially thought to possibly be from Hep induced thrombocytopenia given recent admit for cardiac cath and Heparin use //  However, HIT Ab was neg (0.184) // Hematology consult pending  Hypertension Controlled- some nausea which she attributes to Amlodipine which is new for her.    PLAN  The pt and her son appeared satisfied with explanation of events. She mentioned plans for a knee replacement and I told her she would not be able to stop anticoagulation this anytime soon and that she should contact her orthopedist for pain medication.  I told the hematologist evaluation would give [place] more information. F/U with Dr Canary Brim in 3 months.  Kerin Ransom PA-C 04/28/2016 11:33 AM

## 2016-04-28 NOTE — Assessment & Plan Note (Signed)
Profound thrombocytopenia noted upon admission for pulmonary embolism -one platelet count of 34k- subsequent platelet counts 100-150>> initially thought to possibly be from Hep induced thrombocytopenia given recent admit for cardiac cath and Heparin use // However, HIT Ab was neg (0.184) // Hematology consult pending

## 2016-04-28 NOTE — Patient Instructions (Addendum)
MEDICATION CHANGE:  STOP AMLODIPINE FOR NOW, IF NAUSEA DOES NOT GO AWAY , CONTINUE WITH AMLODIPINE and follow up with GI doctor.  ZOFRAN REFILLED  WITH 1 REFILL  No other changes     Your physician recommends that you schedule a follow-up appointment in 3 months with Dr Sallyanne Kuster    If you need a refill on your cardiac medications before your next appointment, please call your pharmacy.

## 2016-04-28 NOTE — Assessment & Plan Note (Signed)
Controlled- some nausea which she attributes to Amlodipine which is new for her.

## 2016-04-29 ENCOUNTER — Encounter: Payer: Self-pay | Admitting: Cardiology

## 2016-05-05 ENCOUNTER — Encounter: Payer: Self-pay | Admitting: Hematology

## 2016-05-05 ENCOUNTER — Ambulatory Visit (HOSPITAL_BASED_OUTPATIENT_CLINIC_OR_DEPARTMENT_OTHER): Payer: Medicare Other | Admitting: Hematology

## 2016-05-05 VITALS — BP 149/70 | HR 65 | Temp 98.2°F | Resp 16 | Wt 181.4 lb

## 2016-05-05 DIAGNOSIS — I824Y1 Acute embolism and thrombosis of unspecified deep veins of right proximal lower extremity: Secondary | ICD-10-CM

## 2016-05-05 DIAGNOSIS — I2699 Other pulmonary embolism without acute cor pulmonale: Secondary | ICD-10-CM | POA: Diagnosis not present

## 2016-05-05 DIAGNOSIS — I10 Essential (primary) hypertension: Secondary | ICD-10-CM | POA: Diagnosis not present

## 2016-05-05 DIAGNOSIS — I82A11 Acute embolism and thrombosis of right axillary vein: Secondary | ICD-10-CM

## 2016-05-05 DIAGNOSIS — E039 Hypothyroidism, unspecified: Secondary | ICD-10-CM

## 2016-05-05 NOTE — Progress Notes (Signed)
Marland Kitchen    HEMATOLOGY/ONCOLOGY CONSULTATION NOTE  Date of Service: 05/05/2016  Patient Care Team: Kathyrn Lass, MD as PCP - General (Family Medicine) Cardiology: Dr Sallyanne Kuster GI: Dr Amedeo Plenty  CHIEF COMPLAINTS/PURPOSE OF CONSULTATION:  Submassive b/l PE and DVT  HISTORY OF PRESENTING ILLNESS:   Michele Thompson is a wonderful 76 y.o. female who has been referred to [place] by Dr .Tawanna Solo, MD  for evaluation and management of recently noted bilateral submassive/massive PE and DVT.  Patient has a history of hypertension, dyslipidemia, hypothyroidism, on estradiol patch for hormone replacement therapy who was recently admitted to the hospital on 03/14/2016 With several weeks of exertional angina and dyspnea with persistence of symptoms at rest. She had an EKG that showed T-wave inversions in V3 V4 and slight elevation of troponin. Her blood pressure was noted to be poorly controlled. She underwent a left heart catheterization with Dr. Burt Knack that showed mild nonobstructive coronary artery disease with normal LV function. She was started on aspirin, statin, Coreg and Norvasc in the setting of possible microvascular disease. Patient was discharged home on 03/16/2016 but was unfortunately readmitted on 03/17/2016 for shortness of breath and significant dyspnea. She was noted to have a bilateral pulmonary emboli extending from the main pulmonary artery all lobes of both lungs with CT evidence of right heart strain consistent with at least submassive PE. US venous UE and LE - showed right upper extremity and large right lower extremity DVT. Patient initially had low platelet count of 34k on presentation but rapidly normalized. HIT was suspected but patient had negative antibodies. Also she had not received heparin for more than a couple of days and no other heparin in the last 100 days. Patients course was complicated by a PEA arrest. Patient apparently needed treatment with TNK and was then treated with IV  Argatroban and then transitioned to and discharged on Xarelto. Patient notes that her chest pain has resolved and that her breathing is significantly improved. No other episodes of syncope. No issues with bleeding on the Xarelto.  Patient reports she has had no VTE episodes prior to this one  despite 2 pregnancies and being on estrogen-based pulmonary replacement therapy since age 85 years. Patient has been a lifelong nonsmoker. No family history of thrombophilia. No recent surgical procedures other than left heart catheterization on 03/15/2016. No recent symptomatology suggestive of malignancy. Patient has some chronic constipation for over a year for which she is anticipating having a colonoscopy at some point. She also notes that her left knee has been bothersome due to arthritis and she eventually needs a knee replacement.  Patient's son was present for the interview. Patient notes that her leg swelling is improving. No other acute new focal symptoms at this time.  Her heparin exposure was only for a couple of days and the platelets dropped within about 3-4 days after heparin exposure without any other heparin exposure last 100 days . This along with the negative hit antibody makes it unlikely to be related to HITT syndrome .  Patient notes that she has had some issues with nausea related to anxiety and we addressed some of these . She is also having hot flashes and some sweats since being off her estrogen patch. She was encouraged to discuss alternative treatment options for her symptoms with her primary care physician.   MEDICAL HISTORY:  Past Medical History:  Diagnosis Date  . Coronary artery disease 03/16/2016   a. admx with angina and mildly elevated Troponin >> LHC:  mLAD 30 >> CCB started for poss microvascular angina  . Hyperlipidemia 03/16/2016  . Hypertension   . PE (pulmonary thromboembolism) ([place]) 03/17/2016   Bilateral submassive pulmonary embolus with RUE and RLE DVT on  venous duplex 02/2016 c/b PEA arrest // Echo 03/21/16: mild LVH, EF 60-65, no RWMA, Gr 1 DD, normal RVSF  . Sinus bradycardia 03/31/2016  . Thrombocytopenia ([place]) 02/2016   Profound thrombocytopenia noted upon admission for pulmonary embolism >> initially thought to be from Hep induced thrombocytopenia given recent admit for cardiac cath and Heparin use // However, HIT Ab was neg (0.184) // Hematology consult pending  . Thyroid disease     SURGICAL HISTORY: Past Surgical History:  Procedure Laterality Date  . CARDIAC CATHETERIZATION N/A 03/15/2016   Procedure: Left Heart Cath and Coronary Angiography;  Surgeon: [name], MD;  Location: [facility];  Service: Cardiovascular;  Laterality: N/A;  . VAGINAL HYSTERECTOMY  1983   endometriosis    SOCIAL HISTORY: Social History   Social History  . Marital status: Widowed    Spouse name: N/A  . Number of children: N/A  . Years of education: N/A   Occupational History  . Not on file.   Social History Main Topics  . Smoking status: Never Smoker  . Smokeless tobacco: Never Used  . Alcohol use No  . Drug use: No  . Sexual activity: No     Comment: 1st intercourse 76 yo-Fewer than 5 partners   Other Topics Concern  . Not on file   Social History Narrative   [facility]:   Originally from [place]. Moved to [place] in 2012. 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    FAMILY HISTORY: Family History  Problem Relation Age of Onset  . Heart failure Mother   . Heart failure Father   . Heart failure Maternal Grandmother   . Myasthenia gravis Brother   . Stroke Brother   . Clotting disorder Neg Hx     ALLERGIES:  is allergic to heparin.  MEDICATIONS:  Current Outpatient Prescriptions  Medication Sig Dispense Refill  . amLODipine (NORVASC) 5 MG tablet Take 1 tablet (5 mg total) by mouth daily. 30 tablet 6  . atorvastatin (LIPITOR) 80 MG tablet Take 1 tablet  (80 mg total) by mouth daily at 6 PM. 30 tablet 6  . Cholecalciferol (VITAMIN D PO) Take by mouth.    . docusate sodium (COLACE) 100 MG capsule Take 1 capsule (100 mg total) by mouth 2 (two) times daily as needed for mild constipation. 60 capsule 0  . esomeprazole (NEXIUM) 20 MG packet Take 20 mg by mouth daily before breakfast.    . hydrochlorothiazide (MICROZIDE) 12.5 MG capsule Take 12.5 mg by mouth daily.    Marland Kitchen levothyroxine (SYNTHROID, LEVOTHROID) 100 MCG tablet Take 100 mcg by mouth daily before breakfast.    . lisinopril (PRINIVIL,ZESTRIL) 40 MG tablet Take 1 tablet (40 mg total) by mouth daily. 30 tablet 6  . ondansetron (ZOFRAN) 4 MG tablet Take 1 tablet (4 mg total) by mouth every 8 (eight) hours as needed for nausea or vomiting. 30 tablet 1  . rivaroxaban (XARELTO) 20 MG TABS tablet Take 1 tablet (20 mg total) by mouth daily with supper. 30 tablet 10  . senna (SENOKOT) 8.6 MG tablet Take 1 tablet by mouth 2 (two) times daily as needed for constipation.     No current facility-administered medications for this visit.  REVIEW OF SYSTEMS:    10 Point review of Systems was done is negative except as noted above.  PHYSICAL EXAMINATION: ECOG PERFORMANCE STATUS: 2 - Symptomatic, <50% confined to bed  . Vitals:   05/05/16 1100  BP: (!) 149/70  Pulse: 65  Resp: 16  Temp: 98.2 F (36.8 C)   Filed Weights   05/05/16 1100  Weight: 181 lb 6.4 oz (82.3 kg)   .Body mass index is 33.18 kg/m.  GENERAL:alert, in no acute distress and comfortable SKIN: skin color, texture, turgor are normal, no rashes or significant lesions EYES: normal, conjunctiva are pink and non-injected, sclera clear OROPHARYNX:no exudate, no erythema and lips, buccal mucosa, and tongue normal  NECK: supple, no JVD, thyroid normal size, non-tender, without nodularity LYMPH:  no palpable lymphadenopathy in the cervical, axillary or inguinal LUNGS: clear to auscultation with normal respiratory effort HEART:  regular rate & rhythm,  no murmurs and no lower extremity edema ABDOMEN: abdomen soft, non-tender, normoactive bowel sounds  Musculoskeletal: no cyanosis of digits and no clubbing  PSYCH: alert & oriented x 3 with fluent speech NEURO: no focal motor/sensory deficits  LABORATORY DATA:  I have reviewed the data as listed  . CBC Latest Ref Rng & Units 03/21/2016 03/20/2016 03/19/2016  WBC 4.0 - 10.5 K/uL 7.5 9.4 13.0(H)  Hemoglobin 12.0 - 15.0 g/dL 10.9(L) 10.4(L) 8.5(L)  Hematocrit 36.0 - 46.0 % 31.9(L) 31.1(L) 26.0(L)  Platelets 150 - 400 K/uL 134(L) 116(L) 109(L)    . CMP Latest Ref Rng & Units 03/21/2016 03/20/2016 03/19/2016  Glucose 65 - 99 mg/dL 107(H) 96 99  BUN 6 - 20 mg/dL 9 10 12   Creatinine 0.44 - 1.00 mg/dL 0.87 0.99 1.06(H)  Sodium 135 - 145 mmol/L 139 139 141  Potassium 3.5 - 5.1 mmol/L 3.9 3.0(L) 3.6  Chloride 101 - 111 mmol/L 106 108 115(H)  CO2 22 - 32 mmol/L 26 24 19(L)  Calcium 8.9 - 10.3 mg/dL 8.0(L) 7.5(L) 7.3(L)     RADIOGRAPHIC STUDIES: I have personally reviewed the radiological images as listed and agreed with the findings in the report. US Abdomen Limited Ruq  Result Date: 04/07/2016 CLINICAL DATA:  Epigastric abdominal pain EXAM: US ABDOMEN LIMITED - RIGHT UPPER QUADRANT COMPARISON:  None. FINDINGS: Gallbladder: No gallstones or wall thickening visualized. No sonographic Murphy sign noted by sonographer. Common bile duct: Diameter: 6.8 mm Liver: No focal lesion identified. Increased hepatic parenchymal echogenicity. IMPRESSION: 1. No cholelithiasis or sonographic evidence of acute cholecystitis. 2. Increased hepatic echogenicity as can be seen with hepatic steatosis. Electronically Signed   By: Kathreen Devoid   On: 04/07/2016 15:14   CTA chest 03/17/2016 IMPRESSION: 1. Bilateral pulmonary embolus, extending from the main pulmonary arteries to all lobes of both lungs. Positive for acute PE with CT evidence of right heart strain (RV/LV Ratio = 2.2) consistent  with at least submassive (intermediate risk) PE. The presence of right heart strain has been associated with an increased risk of morbidity and mortality. Please activate Code PE by paging [phone]. 2. Mild patchy bilateral atelectasis noted; lungs otherwise clear. 3. Osseous fusion at T7-T8.  Critical Value/emergent results were called by telephone at the time of interpretation on 03/17/2016 at 4:21 am to Dr. Pryor Curia, who verbally acknowledged these results.   Electronically Signed   By: Garald Balding M.D.   On: 03/17/2016 04:29    ASSESSMENT & PLAN:   76 year old Caucasian female with multiple medical comorbidities including hypertension, dyslipidemia, hypothyroidism with   1) Acute  Rt LE and Rt UE DVT 2) Acute submassive b/l pulmonary embolism-concern for rt heart strain on CTA but not on ECHO. complicated by PE arrest and requiring TNK and currently on Xarelto for anticoagulation.  Given the patient's age of 14 years. Her estradiol patch was likely the primary provoking factor in the patient's extensive VTE. She appears to have had symptoms for several months suggesting progressive DVT and PE which got to the extent that it was fairly symptomatic. No family history to suggest a hereditary thrombophilia. Some element of immobilization in the hospital for a cardiac catheterization could have been an additional factor. Patient has some obesity .Body mass index is 33.18 kg/m. Which could be modifiable risk factor. She has some relative immobility due to knee problems.  2) Thrombocytopenia Low probability for HIT syndrome given time course of platelet drop with in 3-4 days and neg HIT ab with OD of 0.184. For OD<0.4 chance of a positive SRA would be 0.0 to -0.1% Thrombocytopenia could have been from platelet consumption from massive clotting. PLAN  -would recommend staying off any estrogen replacement therapy. -Work on modifiable risk factors including  avoiding  dehydration, staying physically active good perioperative VTE prophylaxis. -Given the extent of her VTE would avoid interruption of her anticoagulation for at least 6 months unless absolutely needed for an emergent diagnostic or surgical procedure that is unavoidable. -Would anticoagulate therapeutically with Xarelto for atleast 12 months given the extent of her clots. Since she does have the avoidable provoking factor (Estrogen replacement) -- and 12 months might consider would be prophylactic dose Xarelto or full dose aspirin. It would not be incorrect to offer her ongoing anticoagulation in the absence of any increased bleeding risk. -If a transition to full dose aspirin is made would recommend checking d-dimer before getting on Xarelto. If that is normal would recheck d-dimer in 4-6 weeks while on aspirin to see if it goes increases. If it does increase might use this as an early marker to consider prophylactic or therapeutic Xarelto. -Patient and her son understand they would been elevated risk of recurrent VTE off anticoagulation and the final decision after 12 months of anticoagulation would be a bleeding risk versus clotting prevention benefit analysis by her primary care physician.  Please let us know if there are any additional questions. We'll have the patient continue follow-up with her primary care physician and cardiologist for continued cares. All of the patients questions were answered with apparent satisfaction. The patient knows to call the clinic with any problems, questions or concerns.  I spent 50 minutes counseling the patient face to face. The total time spent in the appointment was 60 minutes and more than 50% was on counseling and direct patient cares.    Sullivan Lone MD [place] AAHIVMS [employer] [facility] Hematology/Oncology Physician [facility]  (Office):       [phone] (Work cell):  [phone] (Fax):           [phone]  05/05/2016 10:36 AM

## 2016-05-10 ENCOUNTER — Ambulatory Visit
Admission: RE | Admit: 2016-05-10 | Discharge: 2016-05-10 | Disposition: A | Discharge status: Home / Self Care | Payer: Medicare Other | Source: Ambulatory Visit | Attending: Family Medicine | Admitting: Family Medicine

## 2016-05-10 ENCOUNTER — Other Ambulatory Visit: Payer: Self-pay | Admitting: Family Medicine

## 2016-05-10 DIAGNOSIS — Z1231 Encounter for screening mammogram for malignant neoplasm of breast: Secondary | ICD-10-CM

## 2016-05-21 NOTE — Telephone Encounter (Signed)
PA approved through 04/18/2017.  

## 2016-06-09 ENCOUNTER — Ambulatory Visit: Payer: Medicare Other | Admitting: Psychology

## 2016-07-27 ENCOUNTER — Ambulatory Visit: Payer: Self-pay | Admitting: Pulmonary Disease

## 2016-07-28 ENCOUNTER — Encounter: Payer: Self-pay | Admitting: Pulmonary Disease

## 2016-07-28 ENCOUNTER — Ambulatory Visit (INDEPENDENT_AMBULATORY_CARE_PROVIDER_SITE_OTHER): Payer: Medicare Other | Admitting: Pulmonary Disease

## 2016-07-28 ENCOUNTER — Other Ambulatory Visit (INDEPENDENT_AMBULATORY_CARE_PROVIDER_SITE_OTHER): Payer: Medicare Other

## 2016-07-28 ENCOUNTER — Telehealth: Payer: Self-pay | Admitting: Pulmonary Disease

## 2016-07-28 VITALS — BP 136/76 | HR 71 | Ht 62.0 in | Wt 177.2 lb

## 2016-07-28 DIAGNOSIS — R0600 Dyspnea, unspecified: Secondary | ICD-10-CM

## 2016-07-28 DIAGNOSIS — I2692 Saddle embolus of pulmonary artery without acute cor pulmonale: Secondary | ICD-10-CM | POA: Diagnosis not present

## 2016-07-28 LAB — CBC WITH DIFFERENTIAL/PLATELET
Basophils Absolute: 0.1 10*3/uL (ref 0.0–0.1)
Basophils Relative: 1 % (ref 0.0–3.0)
Eosinophils Absolute: 0.1 10*3/uL (ref 0.0–0.7)
Eosinophils Relative: 1 % (ref 0.0–5.0)
HCT: 39.3 % (ref 36.0–46.0)
Hemoglobin: 13.2 g/dL (ref 12.0–15.0)
Lymphocytes Relative: 24.4 % (ref 12.0–46.0)
Lymphs Abs: 2.2 10*3/uL (ref 0.7–4.0)
MCHC: 33.5 g/dL (ref 30.0–36.0)
MCV: 92.7 fl (ref 78.0–100.0)
Monocytes Absolute: 0.5 10*3/uL (ref 0.1–1.0)
Monocytes Relative: 5.6 % (ref 3.0–12.0)
Neutro Abs: 6.2 10*3/uL (ref 1.4–7.7)
Neutrophils Relative %: 68 % (ref 43.0–77.0)
Platelets: 203 10*3/uL (ref 150.0–400.0)
RBC: 4.24 Mil/uL (ref 3.87–5.11)
RDW: 13.4 % (ref 11.5–15.5)
WBC: 9 10*3/uL (ref 4.0–10.5)

## 2016-07-28 NOTE — Patient Instructions (Signed)
   We are going to keep you on blood thinner (Xarelto) until November.  Call me if you have any bleeding that you notice or need refills on your Xarelto.  I will see you back after your breathing & walking test.  Call me if you have any questions or concerns.  TESTS ORDERED: 1. Serum CBC today 2. Full PFTs before next appointment 3. 6MWT before next appointment

## 2016-07-28 NOTE — Telephone Encounter (Signed)
IMAGING CTA CHEST 03/17/16 (previously reviewed by me):  Bilateral pulmonary embolism extending from Main pulmonary artery to all lobes of both lungs. Positive for acute pulmonary embolism with RV/LV ratio 2.2.  CARDIAC TTE (03/21/16): LV normal in size with mild LVH. EF 60-65%. No regional wall motion abnormalities & grade 1 diastolic dysfunction. LA & RA normal in size. RV normal in size and function. No aortic stenosis or regurgitation. Aortic root normal in size. No mitral stenosis or regurgitation. Trivial pulmonic regurgitation. Trivial tricuspid regurgitation. No pericardial effusion.  LHC (03/16/16):  Nonobstructive CAD w/ mid-LAD irregularity. Normal LV function.  LABS 03/17/16 HITT antibody: 0.184 (negative) D-dimer: 3.46 Fibrinogen: 430 INR: 1.06 PTT: 26

## 2016-07-28 NOTE — Progress Notes (Signed)
Subjective:    Patient ID: Michele Thompson, female    DOB: 31-Jul-1940, 76 y.o.   MRN: [id number]  C.C.:  Follow-up for Acute PE.  HPI Acute PE: Bilateral on CT angiogram from November 2017. Started on treatment with systemic anticoagulation. Likely etiology attributable to recent hospitalization with left heart catheterization/immobilization as well as estrogen patch for hormone replacement. Evaluated by hematology who recommended at least 6 months and up to 1 year of systemic anticoagulation. They recommended consideration of transition to full dose aspirin therapy after 12 months depending upon bleeding risk with evaluation utilizing a serum d-dimer after 4-6 weeks. She denies any melena, hematochezia or melena. Denies any hematuria. Reports she did have an arthrocentesis with her right knee that did have some bloody fluid. She does continue to have dyspnea on exertion. She denies any other dyspnea other than with exertion. She does feel she has shortness of breath with talking. Denies any coughing or wheezing.  Review of Systems She denies any syncope or near syncope. No chest pain, pressure, or tightness. No abdominal pain or emesis. She does have a fairly constant nausea.   Allergies  Allergen Reactions  . Heparin     HIT panel pending    Current Outpatient Prescriptions on File Prior to Visit  Medication Sig Dispense Refill  . atorvastatin (LIPITOR) 80 MG tablet Take 1 tablet (80 mg total) by mouth daily at 6 PM. 30 tablet 6  . docusate sodium (COLACE) 100 MG capsule Take 1 capsule (100 mg total) by mouth 2 (two) times daily as needed for mild constipation. 60 capsule 0  . hydrochlorothiazide (MICROZIDE) 12.5 MG capsule Take 12.5 mg by mouth daily.    Marland Kitchen levothyroxine (SYNTHROID, LEVOTHROID) 100 MCG tablet Take 100 mcg by mouth daily before breakfast.    . lisinopril (PRINIVIL,ZESTRIL) 40 MG tablet Take 1 tablet (40 mg total) by mouth daily. 30 tablet 6  . rivaroxaban (XARELTO) 20 MG TABS  tablet Take 1 tablet (20 mg total) by mouth daily with supper. 30 tablet 10  . senna (SENOKOT) 8.6 MG tablet Take 1 tablet by mouth 2 (two) times daily as needed for constipation.    Marland Kitchen amLODipine (NORVASC) 5 MG tablet Take 1 tablet (5 mg total) by mouth daily. (Patient not taking: Reported on 07/28/2016) 30 tablet 6  . Cholecalciferol (VITAMIN D PO) Take by mouth.    . esomeprazole (NEXIUM) 20 MG packet Take 20 mg by mouth daily before breakfast.    . ondansetron (ZOFRAN) 4 MG tablet Take 1 tablet (4 mg total) by mouth every 8 (eight) hours as needed for nausea or vomiting. (Patient not taking: Reported on 07/28/2016) 30 tablet 1   No current facility-administered medications on file prior to visit.     Past Medical History:  Diagnosis Date  . Coronary artery disease 03/16/2016   a. admx with angina and mildly elevated Troponin >> LHC: mLAD 30 >> CCB started for poss microvascular angina  . Hyperlipidemia 03/16/2016  . Hypertension   . PE (pulmonary thromboembolism) ([facility]) 03/17/2016   Bilateral submassive pulmonary embolus with RUE and RLE DVT on venous duplex 02/2016 c/b PEA arrest // Echo 03/21/16: mild LVH, EF 60-65, no RWMA, Gr 1 DD, normal RVSF  . Sinus bradycardia 03/31/2016  . Thrombocytopenia ([place]) 02/2016   Profound thrombocytopenia noted upon admission for pulmonary embolism >> initially thought to be from Hep induced thrombocytopenia given recent admit for cardiac cath and Heparin use // However, HIT Ab was neg (0.184) //  Hematology consult pending  . Thyroid disease     Past Surgical History:  Procedure Laterality Date  . CARDIAC CATHETERIZATION N/A 03/15/2016   Procedure: Left Heart Cath and Coronary Angiography;  Surgeon: [name], MD;  Location: [facility];  Service: Cardiovascular;  Laterality: N/A;  . VAGINAL HYSTERECTOMY  1983   endometriosis    Family History  Problem Relation Age of Onset  . Heart failure Mother   . Heart failure Father   . Heart  failure Maternal Grandmother   . Myasthenia gravis Brother   . Stroke Brother   . Clotting disorder Neg Hx     Social History   Social History  . Marital status: Widowed    Spouse name: N/A  . Number of children: N/A  . Years of education: N/A   Social History Main Topics  . Smoking status: Never Smoker  . Smokeless tobacco: Never Used  . Alcohol use No  . Drug use: No  . Sexual activity: No     Comment: 1st intercourse 76 yo-Fewer than 5 partners   Other Topics Concern  . None   Social History Narrative   [facility]:   Originally from [place]. Moved to [place] in 2012. 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      Objective:   Physical Exam BP 136/76 (BP Location: Left Arm, Patient Position: Sitting, Cuff Size: Normal)   Pulse 71   Ht 5\' 2"  (1.575 m)   Wt 177 lb 3.2 oz (80.4 kg)   SpO2 98%   BMI 32.41 kg/m  General:  Awake. Alert. No acute distress. Mild central obesity.  Integument:  Warm & dry. No rash on exposed skin. No bruising on exposed skin. Extremities:  No cyanosis or clubbing.  HEENT:  Moist mucus membranes. No oral ulcers. No scleral injection or icterus. Cardiovascular:  Regular rate and rhythm. No edema. No appreciable JVD.  Pulmonary:  Good aeration & clear to auscultation bilaterally. Symmetric chest wall expansion. No accessory muscle use on room air. Abdomen: Soft. Normal bowel sounds. Protuberant. Grossly nontender. Musculoskeletal:  Normal bulk and tone. No joint deformity or effusion appreciated.  IMAGING CTA CHEST 03/17/16 (previously reviewed by me):  Bilateral pulmonary embolism extending from Main pulmonary artery to all lobes of both lungs. Positive for acute pulmonary embolism with RV/LV ratio 2.2.  CARDIAC TTE (03/21/16): LV normal in size with mild LVH. EF 60-65%. No regional wall motion abnormalities & grade 1 diastolic dysfunction. LA & RA normal in size. RV normal in size  and function. No aortic stenosis or regurgitation. Aortic root normal in size. No mitral stenosis or regurgitation. Trivial pulmonic regurgitation. Trivial tricuspid regurgitation. No pericardial effusion.  LHC (03/16/16): Nonobstructive CAD w/ mid-LAD irregularity. Normal LV function.  LABS 03/17/16 HITT antibody: 0.184 (negative) D-dimer: 3.46 Fibrinogen: 430 INR: 1.06 PTT: 26    Assessment & Plan:  77 y.o. female with acute pulmonary embolism.Patient is continuing on systemic anticoagulation for her provoked DVT and subtle pulmonary embolism. Reviewed note from hematology and following recommendations. No obvious bleeding. However, anemia could be contributing to patient's symptoms. We will need to perform pulmonary function testing to better evaluate for possible occult causes for her dyspnea. Alternatively, this could simply be due to deconditioning. I instructed the patient contact my office if she had any new breathing problems or questions before next appointment.  1. Acute pulmonary embolism:Continuing systemic anticoagulation for 12 months per hematology recommendations (November 2018).  2. Dyspnea: Checking a CBC today. Checking full pulmonary function testing and 6 minute walk test on room air before next appointment. 3. Follow-up: Return to clinic in 8 weeks or sooner if needed.  Sonia Baller Ashok Cordia, M.D. [facility] Pager:  [phone] After 3pm or if no response, call [phone] 2:18 PM 07/28/16

## 2016-08-19 ENCOUNTER — Telehealth: Payer: Self-pay | Admitting: Pulmonary Disease

## 2016-08-19 DIAGNOSIS — I2699 Other pulmonary embolism without acute cor pulmonale: Secondary | ICD-10-CM

## 2016-08-19 NOTE — Telephone Encounter (Signed)
Pt reviewed labs on MyChart, has several questions regarding her labwork: 1. If her labwork is normal, does this indicate that she is unlikely to develop another blood clot? 2. If it is likely that the blood clot she developed came from her estrogen (which she has now been off of X5 months), and now that she's off of the estrogen, how likely is it that she can come off of anticoagulation therapy? 3. If her labs are normal, would JN give her clearance for a TKR?  JN please advise.  Thanks.

## 2016-08-19 NOTE — Telephone Encounter (Signed)
Answers: 1.  The blood work does not predict her probability to have another blood clot. It was simply to make sure she wasn't loosing unseen blood while on her anticoagulation.  2.  As we discussed at last appointment, Hematology recommended 6 months to 1 year of treatment with systemic anticoagulation. Her clot was in November 2017 so we could consider stopping the anticoagulation this month (May).  3.  Regarding a total knee replacement, this is a risk vs benefit question. Coming off of the anticoagulation after this month would allow the surgery to proceed but she would be at a higher risk of having a clot. She will have to determine if that risk is worth the potential benefit of the replacement. A lot of surgeons use the term "clearance" but this is something that does not exist. Ultimately, it is her choice if the surgeon is willing to do the operation. I would recommend waiting the full 6 months.  If she desires we could try to move her appointments up (Full PFTs & 6MWT on room air) to discuss further. Go ahead and order a serum D-dimer that can be done in the next couple of weeks. Thanks.

## 2016-08-20 NOTE — Telephone Encounter (Signed)
Pt is aware of JN's below message and voiced her understanding. Pt states she will keep her upcoming appointments as they are. D-dimer has been ordered-pt is aware to come by within the next couple of weeks. Nothing further needed.

## 2016-08-20 NOTE — Telephone Encounter (Signed)
Patient returned call, CB is [phone].

## 2016-08-20 NOTE — Telephone Encounter (Signed)
lmtcb x1 for pt. 

## 2016-08-27 ENCOUNTER — Other Ambulatory Visit: Payer: Medicare Other

## 2016-08-27 DIAGNOSIS — I2699 Other pulmonary embolism without acute cor pulmonale: Secondary | ICD-10-CM

## 2016-08-28 LAB — D-DIMER, QUANTITATIVE: D-Dimer, Quant: 1.29 mcg/mL FEU — ABNORMAL HIGH (ref <0.50)

## 2016-09-23 ENCOUNTER — Other Ambulatory Visit: Payer: Self-pay | Admitting: *Deleted

## 2016-09-23 MED ORDER — LISINOPRIL 40 MG PO TABS: 40.0000 mg | ORAL_TABLET | Freq: Every day | ORAL | 1 refills | Status: DC

## 2016-09-23 MED ORDER — ATORVASTATIN CALCIUM 80 MG PO TABS: 80.0000 mg | ORAL_TABLET | Freq: Every day | ORAL | 1 refills | Status: DC

## 2016-10-03 ENCOUNTER — Emergency Department (HOSPITAL_COMMUNITY)
Admission: EM | Admit: 2016-10-03 | Discharge: 2016-10-03 | Disposition: A | Discharge status: Home / Self Care | Payer: Medicare Other | Attending: Physician Assistant | Admitting: Physician Assistant

## 2016-10-03 ENCOUNTER — Encounter (HOSPITAL_COMMUNITY): Payer: Self-pay | Admitting: Emergency Medicine

## 2016-10-03 ENCOUNTER — Emergency Department (HOSPITAL_BASED_OUTPATIENT_CLINIC_OR_DEPARTMENT_OTHER)
Admit: 2016-10-03 | Discharge: 2016-10-03 | Disposition: A | Discharge status: Home / Self Care | Payer: Medicare Other | Attending: Emergency Medicine | Admitting: Emergency Medicine

## 2016-10-03 ENCOUNTER — Emergency Department (HOSPITAL_COMMUNITY): Payer: Medicare Other

## 2016-10-03 DIAGNOSIS — I252 Old myocardial infarction: Secondary | ICD-10-CM | POA: Insufficient documentation

## 2016-10-03 DIAGNOSIS — E039 Hypothyroidism, unspecified: Secondary | ICD-10-CM | POA: Diagnosis not present

## 2016-10-03 DIAGNOSIS — M79609 Pain in unspecified limb: Secondary | ICD-10-CM | POA: Diagnosis not present

## 2016-10-03 DIAGNOSIS — M7989 Other specified soft tissue disorders: Secondary | ICD-10-CM | POA: Diagnosis not present

## 2016-10-03 DIAGNOSIS — M25561 Pain in right knee: Secondary | ICD-10-CM | POA: Diagnosis present

## 2016-10-03 DIAGNOSIS — Z7901 Long term (current) use of anticoagulants: Secondary | ICD-10-CM | POA: Diagnosis not present

## 2016-10-03 DIAGNOSIS — G8929 Other chronic pain: Secondary | ICD-10-CM | POA: Insufficient documentation

## 2016-10-03 DIAGNOSIS — M1711 Unilateral primary osteoarthritis, right knee: Secondary | ICD-10-CM | POA: Insufficient documentation

## 2016-10-03 DIAGNOSIS — Z79899 Other long term (current) drug therapy: Secondary | ICD-10-CM | POA: Diagnosis not present

## 2016-10-03 DIAGNOSIS — I2510 Atherosclerotic heart disease of native coronary artery without angina pectoris: Secondary | ICD-10-CM | POA: Diagnosis not present

## 2016-10-03 DIAGNOSIS — I10 Essential (primary) hypertension: Secondary | ICD-10-CM | POA: Insufficient documentation

## 2016-10-03 LAB — CBC WITH DIFFERENTIAL/PLATELET
Basophils Absolute: 0 10*3/uL (ref 0.0–0.1)
Basophils Relative: 0 %
Eosinophils Absolute: 0.1 10*3/uL (ref 0.0–0.7)
Eosinophils Relative: 1 %
HCT: 38.2 % (ref 36.0–46.0)
Hemoglobin: 12.8 g/dL (ref 12.0–15.0)
Lymphocytes Relative: 14 %
Lymphs Abs: 1.4 10*3/uL (ref 0.7–4.0)
MCH: 31.4 pg (ref 26.0–34.0)
MCHC: 33.5 g/dL (ref 30.0–36.0)
MCV: 93.9 fL (ref 78.0–100.0)
Monocytes Absolute: 0.4 10*3/uL (ref 0.1–1.0)
Monocytes Relative: 4 %
Neutro Abs: 8 10*3/uL — ABNORMAL HIGH (ref 1.7–7.7)
Neutrophils Relative %: 81 %
Platelets: 171 10*3/uL (ref 150–400)
RBC: 4.07 MIL/uL (ref 3.87–5.11)
RDW: 12.1 % (ref 11.5–15.5)
WBC: 10 10*3/uL (ref 4.0–10.5)

## 2016-10-03 LAB — BASIC METABOLIC PANEL
Anion gap: 10 (ref 5–15)
BUN: 10 mg/dL (ref 6–20)
CO2: 26 mmol/L (ref 22–32)
Calcium: 9.4 mg/dL (ref 8.9–10.3)
Chloride: 101 mmol/L (ref 101–111)
Creatinine, Ser: 0.84 mg/dL (ref 0.44–1.00)
GFR calc Af Amer: >60 mL/min (ref >60)
GFR calc non Af Amer: >60 mL/min (ref >60)
Glucose, Bld: 118 mg/dL — ABNORMAL HIGH (ref 65–99)
Potassium: 3.8 mmol/L (ref 3.5–5.1)
Sodium: 137 mmol/L (ref 135–145)

## 2016-10-03 MED ORDER — OXYCODONE-ACETAMINOPHEN 5-325 MG PO TABS: 1.0000 | ORAL_TABLET | Freq: Four times a day (QID) | ORAL | 0 refills | Status: DC | PRN

## 2016-10-03 MED ORDER — OXYCODONE-ACETAMINOPHEN 5-325 MG PO TABS
1.0000 | ORAL_TABLET | Freq: Once | ORAL | Status: AC
  Administered 2016-10-03: 1 via ORAL
  Filled 2016-10-03: qty 1

## 2016-10-03 MED ORDER — MORPHINE SULFATE (PF) 4 MG/ML IV SOLN
4.0000 mg | Freq: Once | INTRAVENOUS | Status: AC
  Administered 2016-10-03: 4 mg via INTRAVENOUS
  Filled 2016-10-03: qty 1

## 2016-10-03 NOTE — ED Triage Notes (Signed)
[facility] has had R leg pain that has gotten worse today. Swelling in R knee and pain in calf, pt A+Ox4. States pain is 10/10, able to find pulses in R foot

## 2016-10-03 NOTE — Progress Notes (Signed)
VASCULAR LAB PRELIMINARY  PRELIMINARY  PRELIMINARY  PRELIMINARY  Right lower extremity venous duplex completed.    Preliminary report:  There is no obvious evidence DVT or SVT noted in the right lower extremity.  Called report to [place], [place]  [facility], [facility], RVT 10/03/2016, 3:37 PM

## 2016-10-03 NOTE — ED Notes (Addendum)
ED Provider at bedside. Dr. Thomasene Lot at bedside

## 2016-10-03 NOTE — ED Notes (Addendum)
Patient transported to X-ray 

## 2016-10-03 NOTE — Discharge Instructions (Signed)
Wear the Ace bandage to help with support and stabilization.  Take pain medication as instructed for pain.  Follow up with orthopedic doctor. Call to arrange for an appointment to be seen.  Return the emergency Department for any worsening pain, fever, worsening swelling or any other worsening or concerning symptoms.

## 2016-10-03 NOTE — ED Notes (Signed)
PT's visitor/ driver assisted to her vehicle by security.

## 2016-10-03 NOTE — ED Provider Notes (Signed)
[facility] DEPT Provider Note   CSN: [id number] Arrival date & time: 10/03/16  1353     History   Chief Complaint Chief Complaint  Patient presents with  . Leg Pain    HPI Michele Thompson is a 76 y.o. female who presents to the emergency Department complaining of worsening right knee pain and swelling that began yesterday. Patient reports that the symptoms significantly worsened this morning prompting EMS call. Pain is worsened with movement of the right lower extremity. She reports that she has been able to ambulate but with pain. She reports that since this morning ambulation is continuously more painful. She has been taking Tylenol with no relief of symptoms. Patient denies any preceding trauma or injury.  She has a known history of osteoarthritis to the right knee. She was scheduled to get a knee replacement of the knee but had to display at because she had an incidence of PE and DVT. Patient is currently on Xarelto which she takes daily and states that she has been compliant. Patient denies any recent fevers, illness, chest pain, difficulty breathing, nausea/vomiting, abdominal pain, dysuria, hematuria.  The history is provided by the patient.    Past Medical History:  Diagnosis Date  . Coronary artery disease 03/16/2016   a. admx with angina and mildly elevated Troponin >> LHC: mLAD 30 >> CCB started for poss microvascular angina  . Hyperlipidemia 03/16/2016  . Hypertension   . PE (pulmonary thromboembolism) ([place]) 03/17/2016   Bilateral submassive pulmonary embolus with RUE and RLE DVT on venous duplex 02/2016 c/b PEA arrest // Echo 03/21/16: mild LVH, EF 60-65, no RWMA, Gr 1 DD, normal RVSF  . Sinus bradycardia 03/31/2016  . Thrombocytopenia ([place]) 02/2016   Profound thrombocytopenia noted upon admission for pulmonary embolism >> initially thought to be from Hep induced thrombocytopenia given recent admit for cardiac cath and Heparin use // However, HIT Ab was neg (0.184) //  Hematology consult pending  . Thyroid disease     Patient Active Problem List   Diagnosis Date Noted  . Dyspnea 07/28/2016  . DVT (deep venous thrombosis) ([place]) 04/23/2016  . Coronary artery disease involving native heart without angina pectoris 03/31/2016  . Sinus bradycardia 03/31/2016  . NSTEMI (non-ST elevated myocardial infarction) ([place])   . Elevated troponin 03/17/2016  . PE (pulmonary thromboembolism) ([place]) 03/17/2016  . Hypothyroidism 03/17/2016  . Rectal bleeding 03/17/2016  . Acute respiratory failure with hypoxia ([place]) 03/17/2016  . Headache 03/17/2016  . Thrombocytopenia ([place])   . Hypoxemia   . Heparin induced thrombocytopenia ([facility])   . Cardiogenic shock ([place])   . Cardiac arrest ([place])   . Anoxic encephalopathy ([place])   . Hyperlipidemia 03/16/2016  . Hypertension 03/16/2016    Past Surgical History:  Procedure Laterality Date  . CARDIAC CATHETERIZATION N/A 03/15/2016   Procedure: Left Heart Cath and Coronary Angiography;  Surgeon: [name], MD;  Location: [facility];  Service: Cardiovascular;  Laterality: N/A;  . VAGINAL HYSTERECTOMY  1983   endometriosis    OB History    Gravida Para Term Preterm AB Living   2 2 2     2    SAB TAB Ectopic Multiple Live Births                   Home Medications    Prior to Admission medications   Medication Sig Start Date End Date Taking? Authorizing Provider  amLODipine (NORVASC) 5 MG tablet Take 1 tablet (5 mg total) by mouth  daily. Patient not taking: Reported on 07/28/2016 03/17/16   Reino Bellis B, NP  atorvastatin (LIPITOR) 80 MG tablet Take 1 tablet (80 mg total) by mouth daily at 6 PM. 09/23/16   Kilroy, Doreene Burke, PA-C  Cholecalciferol (VITAMIN D PO) Take by mouth.    [provider]  docusate sodium (COLACE) 100 MG capsule Take 1 capsule (100 mg total) by mouth 2 (two) times daily as needed for mild constipation. 03/21/16   Thurnell Lose, MD  esomeprazole (NEXIUM) 20 MG packet Take 20 mg  by mouth daily before breakfast.    [provider]  hydrochlorothiazide (MICROZIDE) 12.5 MG capsule Take 12.5 mg by mouth daily.    [provider]  levothyroxine (SYNTHROID, LEVOTHROID) 100 MCG tablet Take 100 mcg by mouth daily before breakfast.    [provider]  lisinopril (PRINIVIL,ZESTRIL) 40 MG tablet Take 1 tablet (40 mg total) by mouth daily. 09/23/16   Erlene Quan, PA-C  ondansetron (ZOFRAN) 4 MG tablet Take 1 tablet (4 mg total) by mouth every 8 (eight) hours as needed for nausea or vomiting. Patient not taking: Reported on 07/28/2016 04/28/16   Erlene Quan, PA-C  oxyCODONE-acetaminophen (PERCOCET/ROXICET) 5-325 MG tablet Take 1-2 tablets by mouth every 6 (six) hours as needed for severe pain. 10/03/16   Volanda Napoleon, PA-C  rivaroxaban (XARELTO) 20 MG TABS tablet Take 1 tablet (20 mg total) by mouth daily with supper. 04/14/16   Croitoru, Mihai, MD  senna (SENOKOT) 8.6 MG tablet Take 1 tablet by mouth 2 (two) times daily as needed for constipation.    [provider]    Family History Family History  Problem Relation Age of Onset  . Heart failure Mother   . Heart failure Father   . Heart failure Maternal Grandmother   . Myasthenia gravis Brother   . Stroke Brother   . Clotting disorder Neg Hx     Social History Social History  Substance Use Topics  . Smoking status: Never Smoker  . Smokeless tobacco: Never Used  . Alcohol use No     Allergies   Heparin   Review of Systems Review of Systems  Constitutional: Negative for chills and fever.  Respiratory: Negative for cough, chest tightness and shortness of breath.   Cardiovascular: Negative for chest pain and leg swelling.  Gastrointestinal: Negative for abdominal pain, nausea and vomiting.  Musculoskeletal: Positive for joint swelling (Right knee). Negative for back pain.       Right knee pain  Neurological: Negative for weakness and numbness.  All other systems reviewed  and are negative.    Physical Exam Updated Vital Signs BP (!) 170/75   Pulse 80   Temp 98.6 F (37 C) (Oral)   Resp 16   Ht 5\' 3"  (1.6 m)   Wt 78.5 kg (173 lb)   SpO2 100%   BMI 30.65 kg/m   Physical Exam  Constitutional: She is oriented to person, place, and time. She appears well-developed and well-nourished.  Appears uncomfortable.   HENT:  Head: Normocephalic and atraumatic.  Mouth/Throat: Oropharynx is clear and moist and mucous membranes are normal.  Eyes: Conjunctivae, EOM and lids are normal. Pupils are equal, round, and reactive to light.  Neck: Full passive range of motion without pain.  Cardiovascular: Normal rate, regular rhythm, normal heart sounds and normal pulses.  Exam reveals no gallop and no friction rub.   No murmur heard. Pulses:      Radial pulses are 2+  on the right side, and 2+ on the left side.       Dorsalis pedis pulses are 2+ on the right side, and 2+ on the left side.  Pulmonary/Chest: Effort normal.  Musculoskeletal: Normal range of motion.  Tenderness palpation to the right knee with overlying soft tissue swelling. No evidence of deformity or crepitus. No surrounding warmth, erythema or ecchymosis. Flexion and extension intact but with subjective reports of pain. No calf tenderness. No erythema, edema, warmth to the lower right leg. No tenderness palpation to left knee. Full flexion extension intact of left knee without difficulty.  Neurological: She is alert and oriented to person, place, and time. GCS eye subscore is 4. GCS verbal subscore is 5. GCS motor subscore is 6.  Sensation intact all major nerve distributions of the  Skin: Skin is warm and dry. Capillary refill takes less than 2 seconds.  Psychiatric: She has a normal mood and affect. Her speech is normal.  Nursing note and vitals reviewed.    ED Treatments / Results  Labs (all labs ordered are listed, but only abnormal results are displayed) Labs Reviewed  BASIC METABOLIC PANEL -  Abnormal; Notable for the following:       Result Value   Glucose, Bld 118 (*)    All other components within normal limits  CBC WITH DIFFERENTIAL/PLATELET - Abnormal; Notable for the following:    Neutro Abs 8.0 (*)    All other components within normal limits    EKG  EKG Interpretation None       Radiology Dg Knee Complete 4 Views Right  Result Date: 10/03/2016 CLINICAL DATA:  76 year old female with a history of pain and swelling right knee EXAM: RIGHT KNEE - COMPLETE 4+ VIEW COMPARISON:  None. FINDINGS: No acute displaced fracture identified. Medial greater than lateral joint space narrowing and marginal osteophyte formation. Patellofemoral degenerative changes. Joint effusion on the lateral view. Osteopenia. No unexpected radiopaque foreign body. IMPRESSION: Negative for acute bony abnormality. Tricompartmental osteoarthritis. Joint effusion. Electronically Signed   By: Corrie Mckusick D.O.   On: 10/03/2016 15:17    Procedures Procedures (including critical care time)  Medications Ordered in ED Medications  morphine 4 MG/ML injection 4 mg (4 mg Intravenous Given 10/03/16 1433)  oxyCODONE-acetaminophen (PERCOCET/ROXICET) 5-325 MG per tablet 1 tablet (1 tablet Oral Given 10/03/16 1619)     Initial Impression / Assessment and Plan / ED Course  I have reviewed the triage vital signs and the nursing notes.  Pertinent labs & imaging results that were available during my care of the patient were reviewed by me and considered in my medical decision making (see chart for details).     76 year old female with known history of osteoarthritis to knees presents via EMS for worsening right knee pain 2 days. Patient is afebrile, non-toxic appearing, appears uncomfortable but no acute distress. Patient is neurovascularly intact. Consider chronic osteoarthritis versus knee sprain. Low suspicion for acute fracture/dislocation given no history of trauma. History/physical exam are not concerning  for septic arthritis. Will plan to order basic labs including CBC and BMP for evaluation. Will order x-ray for evaluation of fracture versus dislocation. Given that patient has had history of DVTs in the right lower extremity, we'll plan to do a lower extremity ultrasound to rule out presence of DVT. Analgesics provided in the department.  Bedside ultrasound of right knee performed. There is presence of a small effusion but nothing that looks large enough to drain.  Labs reviewed. CBC with normal  white blood cell count. The BMP unremarkable. X-ray shows an acute fracture or dislocation. There is evidence of osteoarthritis and a joint effusion. Given that patient's is currently on anticoagulants, does not have good anatomical landmarks, an ultrasound does not reveal a large effusion, there are no indications for arthrocentesis at this time. Ultrasound is pending.  Discussed with Candace from vascular. Patient's ultrasound for DVT study is negative.  Lower extremity ultrasound was negative for any acute DVT. Discussed results with patient. Will give PO pain medication at this time. ACE bandage provided in the department. Will attempt to ambulate patient in department.  Patient able to ambulate from the room to a wheelchair in the hallway. Discussed plan with patient. Instructed her to follow-up with her orthopedic surgeon for further evaluation. Will provide a short course of pain medications quite home with. Strict return precautions discussed. Patient expresses understanding and agreement to plan.  Final Clinical Impressions(s) / ED Diagnoses   Final diagnoses:  Chronic pain of right knee  Osteoarthritis of right knee, unspecified osteoarthritis type    New Prescriptions Discharge Medication List as of 10/03/2016  4:49 PM    START taking these medications   Details  oxyCODONE-acetaminophen (PERCOCET/ROXICET) 5-325 MG tablet Take 1-2 tablets by mouth every 6 (six) hours as needed for severe  pain., Starting Sun 10/03/2016, Print         Volanda Napoleon, PA-C 10/03/16 2000    Macarthur Critchley, MD 10/05/16 2329

## 2016-10-03 NOTE — ED Notes (Addendum)
PT remains in vascular

## 2016-10-03 NOTE — ED Notes (Addendum)
PT assisted to vehicle via wheelchair by this nurse. This nurse has spoken to [facility] (PT's residence). They instructed PT to come to [facility] where they will assist her out of the car and for the night. PT and visitor are aware of plan. PT aware she cannot drive or operate machinery for the rest of the night or while taking oxycodone for pain.

## 2016-10-11 ENCOUNTER — Ambulatory Visit: Payer: Self-pay

## 2016-10-11 ENCOUNTER — Ambulatory Visit (INDEPENDENT_AMBULATORY_CARE_PROVIDER_SITE_OTHER): Payer: Medicare Other | Admitting: Pulmonary Disease

## 2016-10-11 ENCOUNTER — Ambulatory Visit: Payer: Self-pay | Admitting: Pulmonary Disease

## 2016-10-11 DIAGNOSIS — R0600 Dyspnea, unspecified: Secondary | ICD-10-CM

## 2016-10-11 LAB — PULMONARY FUNCTION TEST
DL/VA % pred: 85 %
DL/VA: 3.99 ml/min/mmHg/L
DLCO cor % pred: 74 %
DLCO cor: 16.96 ml/min/mmHg
DLCO unc % pred: 72 %
DLCO unc: 16.53 ml/min/mmHg
FEF 25-75 Post: 2.86 L/sec
FEF 25-75 Pre: 2.83 L/sec
FEF2575-%Change-Post: 0 %
FEF2575-%Pred-Post: 184 %
FEF2575-%Pred-Pre: 182 %
FEV1-%Change-Post: 6 %
FEV1-%Pred-Post: 116 %
FEV1-%Pred-Pre: 109 %
FEV1-Post: 2.31 L
FEV1-Pre: 2.18 L
FEV1FVC-%Change-Post: 1 %
FEV1FVC-%Pred-Pre: 110 %
FEV6-%Change-Post: 5 %
FEV6-%Pred-Post: 109 %
FEV6-%Pred-Pre: 103 %
FEV6-Post: 2.77 L
FEV6-Pre: 2.61 L
FEV6FVC-%Pred-Post: 105 %
FEV6FVC-%Pred-Pre: 105 %
FVC-%Change-Post: 5 %
FVC-%Pred-Post: 104 %
FVC-%Pred-Pre: 99 %
FVC-Post: 2.77 L
FVC-Pre: 2.63 L
Post FEV1/FVC ratio: 84 %
Post FEV6/FVC ratio: 100 %
Pre FEV1/FVC ratio: 83 %
Pre FEV6/FVC Ratio: 100 %
RV % pred: 131 %
RV: 2.96 L
TLC % pred: 115 %
TLC: 5.66 L

## 2016-10-11 NOTE — Progress Notes (Signed)
PFT done today. 

## 2016-10-13 ENCOUNTER — Telehealth: Payer: Self-pay | Admitting: Cardiology

## 2016-10-13 NOTE — Telephone Encounter (Signed)
S/w pt she states that she need cardiac clearance and ok to stop Xarelto for this surgery.  Made appt with luke to discuss

## 2016-10-13 NOTE — Telephone Encounter (Signed)
New message     Pt states that Lurena Joiner told her that she was cleared from cardiac care, now she needs written clearance before she can have surgery.   Pt is going to have Dr Philipp Deputy office to call for surgical clearance

## 2016-10-26 ENCOUNTER — Encounter: Payer: Self-pay | Admitting: Cardiology

## 2016-10-26 ENCOUNTER — Ambulatory Visit (INDEPENDENT_AMBULATORY_CARE_PROVIDER_SITE_OTHER): Payer: Medicare Other | Admitting: Cardiology

## 2016-10-26 VITALS — BP 126/72 | HR 67 | Ht 63.0 in | Wt 177.4 lb

## 2016-10-26 DIAGNOSIS — Z7901 Long term (current) use of anticoagulants: Secondary | ICD-10-CM | POA: Diagnosis not present

## 2016-10-26 DIAGNOSIS — D696 Thrombocytopenia, unspecified: Secondary | ICD-10-CM | POA: Diagnosis not present

## 2016-10-26 DIAGNOSIS — Z01810 Encounter for preprocedural cardiovascular examination: Secondary | ICD-10-CM | POA: Diagnosis not present

## 2016-10-26 DIAGNOSIS — Z86718 Personal history of other venous thrombosis and embolism: Secondary | ICD-10-CM | POA: Diagnosis not present

## 2016-10-26 DIAGNOSIS — I2699 Other pulmonary embolism without acute cor pulmonale: Secondary | ICD-10-CM

## 2016-10-26 DIAGNOSIS — I2510 Atherosclerotic heart disease of native coronary artery without angina pectoris: Secondary | ICD-10-CM

## 2016-10-26 MED ORDER — ATORVASTATIN CALCIUM 40 MG PO TABS: 40.0000 mg | ORAL_TABLET | Freq: Every day | ORAL | 3 refills | Status: DC

## 2016-10-26 NOTE — Assessment & Plan Note (Signed)
RUE, RLE DVT Dec 2017-negative venous dopplers June 2018

## 2016-10-26 NOTE — Assessment & Plan Note (Signed)
Mild CAD at cath 03/14/16

## 2016-10-26 NOTE — Progress Notes (Signed)
10/26/2016 Michele Thompson   [birth year]  [id number]  Primary Physician Kathyrn Lass, MD Primary Cardiologist: Dr Sallyanne Kuster  HPI:  76 y/o female who we saw in Mar 14 2016 when she presented with chest pain, SOB, and an elevated Troponin. She was treated as a NSTEMI. Cath though revealed only mild CAD with 30% mLAD narrowing. She was discharged and then presented 03/17/17 with acute bilateral pulmonary embolism and thrombocytopenia. Her hospital course was complicated by PEA arrest. Ultimately she was treated with TNKase. She was seen in follow up by hematology (Dr Irene Limbo) who felt the offending agent was Estrogen replacement and this was discontinued. He recommended anticoagulation with Xarelto for at least 6 months before being interrupted for surgery unless it was an emergency. In addition he did not feel the pt had HIT as a cause ofd her thrombocytopenia, it was more likely platelet consumption secondary to massive PE and DVT.  She has done well since. She is in the office today for pre op clearance, she needs a Rt TKR. She has pretty limited mobility and uses a cane. She was in the ED 10/03/16 with knee pain. Lower extremity venous doppler done 6/127/18 did not show DVT. She has not had unusual chest pain or dyspnea.    Current Outpatient Prescriptions  Medication Sig Dispense Refill  . atorvastatin (LIPITOR) 40 MG tablet Take 1 tablet (40 mg total) by mouth daily at 6 PM. 90 tablet 3  . Cholecalciferol (VITAMIN D PO) Take by mouth.    . hydrochlorothiazide (MICROZIDE) 12.5 MG capsule Take 12.5 mg by mouth daily.    Marland Kitchen levothyroxine (SYNTHROID, LEVOTHROID) 100 MCG tablet Take 100 mcg by mouth daily before breakfast.    . lisinopril (PRINIVIL,ZESTRIL) 40 MG tablet Take 1 tablet (40 mg total) by mouth daily. 90 tablet 1  . rivaroxaban (XARELTO) 20 MG TABS tablet Take 1 tablet (20 mg total) by mouth daily with supper. 30 tablet 10  . senna (SENOKOT) 8.6 MG tablet Take 1 tablet by mouth 2 (two) times  daily as needed for constipation.     No current facility-administered medications for this visit.     Allergies  Allergen Reactions  . Heparin     HIT panel pending    Past Medical History:  Diagnosis Date  . Coronary artery disease 03/16/2016   a. admx with angina and mildly elevated Troponin >> LHC: mLAD 30 >> CCB started for poss microvascular angina  . Hyperlipidemia 03/16/2016  . Hypertension   . PE (pulmonary thromboembolism) ([place]) 03/17/2016   Bilateral submassive pulmonary embolus with RUE and RLE DVT on venous duplex 02/2016 c/b PEA arrest // Echo 03/21/16: mild LVH, EF 60-65, no RWMA, Gr 1 DD, normal RVSF  . Sinus bradycardia 03/31/2016  . Thrombocytopenia ([place]) 02/2016   Profound thrombocytopenia noted upon admission for pulmonary embolism >> initially thought to be from Hep induced thrombocytopenia given recent admit for cardiac cath and Heparin use // However, HIT Ab was neg (0.184) // Hematology consult pending  . Thyroid disease     Social History   Social History  . Marital status: Widowed    Spouse name: N/A  . Number of children: N/A  . Years of education: N/A   Occupational History  . Not on file.   Social History Main Topics  . Smoking status: Never Smoker  . Smokeless tobacco: Never Used  . Alcohol use No  . Drug use: No  . Sexual activity: No  Comment: 1st intercourse 76 yo-Fewer than 5 partners   Other Topics Concern  . Not on file   Social History Narrative   [facility]:   Originally from [place]. Moved to [place] in 2012. 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     Family History  Problem Relation Age of Onset  . Heart failure Mother   . Heart failure Father   . Heart failure Maternal Grandmother   . Myasthenia gravis Brother   . Stroke Brother   . Clotting disorder Neg Hx      Review of Systems: General: negative for chills, fever, night sweats or weight changes.   Cardiovascular: negative for chest pain, dyspnea on exertion, edema, orthopnea, palpitations, paroxysmal nocturnal dyspnea or shortness of breath Dermatological: negative for rash Respiratory: negative for cough or wheezing Urologic: negative for hematuria Abdominal: negative for nausea, vomiting, diarrhea, bright red blood per rectum, melena, or hematemesis Neurologic: negative for visual changes, syncope, or dizziness All other systems reviewed and are otherwise negative except as noted above.    Blood pressure 126/72, pulse 67, height 5\' 3"  (1.6 m), weight 177 lb 6.4 oz (80.5 kg).  General appearance: alert, cooperative and no distress Neck: no JVD Lungs: clear to auscultation bilaterally Heart: regular rate and rhythm Extremities: swelling Rt knee Skin: Skin color, texture, turgor normal. No rashes or lesions Neurologic: Grossly normal  EKG NSR  ASSESSMENT AND PLAN:   Pre-operative cardiovascular examination Seen 10/26/16 for pre op cardiac clearance prior to knee surgery  PE (pulmonary thromboembolism) ([place]) Bilateral submassive pulmonary embolus with RUE and RLE DVT -02/2016 c/b PEA arrest // Echo 03/21/16: mild LVH, EF 60-65, no RWMA, Gr 1 DD, normal RVSF Ultimately felt to be secondary to estrogen replacement therapy  History of DVT (deep vein thrombosis) RUE, RLE DVT Dec 2017-negative venous dopplers June 2018  Thrombocytopenia ([place]) Profound thrombocytopenia noted upon admission for pulmonary embolism Nov 2017 - HIT negative. Felt to be secondary to platelet consumption secondary to massive PE and DVT  Coronary artery disease involving native heart without angina pectoris Mild CAD at cath 03/14/16  Chronic anticoagulation Xarelto for 12 months- then consider options   PLAN  Discussed with Dr Sallyanne Kuster. OK to stop Xarelto 48 hrs pre op without crossover with Heparin or Lovenox. Resume ASAP post op.   I also cut her Lipitor back to 40 mg daily. She is concerned  about the possibility of myalgias. Her LDL was 63 in Nov 2017. She does have mild CAD.  Kerin Ransom PA-C 10/26/2016 2:32 PM

## 2016-10-26 NOTE — Assessment & Plan Note (Signed)
Xarelto for 12 months- then consider options

## 2016-10-26 NOTE — Assessment & Plan Note (Signed)
Profound thrombocytopenia noted upon admission for pulmonary embolism Nov 2017 - HIT negative. Felt to be secondary to platelet consumption secondary to massive PE and DVT

## 2016-10-26 NOTE — Assessment & Plan Note (Signed)
Bilateral submassive pulmonary embolus with RUE and RLE DVT -02/2016 c/b PEA arrest // Echo 03/21/16: mild LVH, EF 60-65, no RWMA, Gr 1 DD, normal RVSF Ultimately felt to be secondary to estrogen replacement therapy

## 2016-10-26 NOTE — Patient Instructions (Signed)
Medication Instructions: DECREASE Atorvastatin to 40 mg tablet daily.   Follow-Up: Your physician wants you to follow-up in: 6 months with Dr. Sallyanne Kuster. You will receive a reminder letter in the mail two months in advance. If you don't receive a letter, please call our office to schedule the follow-up appointment.   Any Additional Special Instructions Will Be Listed Below (If Applicable).  STOP Xarelto 48 hours prior to surgery.   If you need a refill on your cardiac medications before your next appointment, please call your pharmacy.

## 2016-10-26 NOTE — Assessment & Plan Note (Signed)
Seen 10/26/16 for pre op cardiac clearance prior to knee surgery

## 2016-11-04 ENCOUNTER — Ambulatory Visit (INDEPENDENT_AMBULATORY_CARE_PROVIDER_SITE_OTHER): Payer: Medicare Other | Admitting: Pulmonary Disease

## 2016-11-04 ENCOUNTER — Encounter: Payer: Self-pay | Admitting: Pulmonary Disease

## 2016-11-04 ENCOUNTER — Ambulatory Visit (INDEPENDENT_AMBULATORY_CARE_PROVIDER_SITE_OTHER): Payer: Medicare Other | Admitting: *Deleted

## 2016-11-04 VITALS — BP 140/80 | HR 66 | Ht 63.0 in | Wt 178.4 lb

## 2016-11-04 DIAGNOSIS — I2699 Other pulmonary embolism without acute cor pulmonale: Secondary | ICD-10-CM | POA: Diagnosis not present

## 2016-11-04 DIAGNOSIS — R0600 Dyspnea, unspecified: Secondary | ICD-10-CM

## 2016-11-04 NOTE — Progress Notes (Signed)
SIX MIN WALK 11/04/2016  Medications HCTZ 12.5mg  and Lisinopril 40mg  were taken at 8am. Synthroid 1104mcg was taken at 7am.   Supplimental Oxygen during Test? (L/min) No  Laps 3  Partial Lap (in Meters) 24  Baseline BP (sitting) 118/76  Baseline Heartrate 73  Baseline Dyspnea ([place] Scale) 0  Baseline Fatigue ([place] Scale) 0  Baseline SPO2 99  BP (sitting) 126/80  Heartrate 90  Dyspnea ([place] Scale) 0  Fatigue ([place] Scale) 0  SPO2 91  BP (sitting) 124/80  Heartrate 90  Stopped or Paused before Six Minutes No  Distance Completed 168  Tech Comments: Patient walked at a steady pace with her walker. She denied any CP or SOB during the walk. No body aches or pains as well. She did not stop at all during the test.

## 2016-11-04 NOTE — Progress Notes (Signed)
Subjective:    Patient ID: Michele Thompson, female    DOB: August 12, 1940, 76 y.o.   MRN: [id number]  C.C.:  Follow-up for Acute PE & Dyspnea.  HPI She is planned for right knee replacement on 9/10.   Acute pulmonary emboli: Identified on CT angiogram November 2017. Likely provoked with left heart catheterization/immobilization as well as estrogen patch for hormone replacement. Started on systemic anticoagulation & evaluated by hematology with recommendation for 6 months to 1 year of treatment. No syncope or near syncope. No melena, hematochezia or hematuria.   Dyspnea: Walk test today shows significant desaturation but no oxygen requirement. Pulmonary function testing today shows air trapping but otherwise normal spirometry and carbon monoxide diffusion capacity. Denies any coughing. She denies any recent or current dyspnea. No wheezing.   Review of Systems No chest pain or pressure. No chest tightness. No fever or chills. Chronic sweats. No abdominal pain, nausea or emesis.   No Active Allergies  Current Outpatient Prescriptions on File Prior to Visit  Medication Sig Dispense Refill  . atorvastatin (LIPITOR) 40 MG tablet Take 1 tablet (40 mg total) by mouth daily at 6 PM. 90 tablet 3  . Cholecalciferol (VITAMIN D PO) Take by mouth.    . hydrochlorothiazide (MICROZIDE) 12.5 MG capsule Take 12.5 mg by mouth daily.    Marland Kitchen levothyroxine (SYNTHROID, LEVOTHROID) 100 MCG tablet Take 100 mcg by mouth daily before breakfast.    . lisinopril (PRINIVIL,ZESTRIL) 40 MG tablet Take 1 tablet (40 mg total) by mouth daily. 90 tablet 1  . rivaroxaban (XARELTO) 20 MG TABS tablet Take 1 tablet (20 mg total) by mouth daily with supper. 30 tablet 10  . senna (SENOKOT) 8.6 MG tablet Take 1 tablet by mouth 2 (two) times daily as needed for constipation.     No current facility-administered medications on file prior to visit.     Past Medical History:  Diagnosis Date  . Coronary artery disease 03/16/2016   a. admx  with angina and mildly elevated Troponin >> LHC: mLAD 30 >> CCB started for poss microvascular angina  . Hyperlipidemia 03/16/2016  . Hypertension   . PE (pulmonary thromboembolism) ([facility]) 03/17/2016   Bilateral submassive pulmonary embolus with RUE and RLE DVT on venous duplex 02/2016 c/b PEA arrest // Echo 03/21/16: mild LVH, EF 60-65, no RWMA, Gr 1 DD, normal RVSF  . Sinus bradycardia 03/31/2016  . Thrombocytopenia ([place]) 02/2016   Profound thrombocytopenia noted upon admission for pulmonary embolism >> initially thought to be from Hep induced thrombocytopenia given recent admit for cardiac cath and Heparin use // However, HIT Ab was neg (0.184) // Hematology consult pending  . Thyroid disease     Past Surgical History:  Procedure Laterality Date  . CARDIAC CATHETERIZATION N/A 03/15/2016   Procedure: Left Heart Cath and Coronary Angiography;  Surgeon: [name], MD;  Location: [facility];  Service: Cardiovascular;  Laterality: N/A;  . VAGINAL HYSTERECTOMY  1983   endometriosis    Family History  Problem Relation Age of Onset  . Heart failure Mother   . Heart failure Father   . Heart failure Maternal Grandmother   . Myasthenia gravis Brother   . Stroke Brother   . Clotting disorder Neg Hx     Social History   Social History  . Marital status: Widowed    Spouse name: N/A  . Number of children: N/A  . Years of education: N/A   Social History Main Topics  . Smoking status:  Never Smoker  . Smokeless tobacco: Never Used  . Alcohol use No  . Drug use: No  . Sexual activity: No     Comment: 1st intercourse 76 yo-Fewer than 5 partners   Other Topics Concern  . None   Social History Narrative   Financial controller Pulmonary:   Originally from [place]. Moved to [place] in 2012. 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      Objective:   Physical Exam BP 140/80 (BP Location: Left Arm, Patient Position: Sitting, Cuff  Size: Large)   Pulse 66   Ht 5\' 3"  (1.6 m)   Wt 178 lb 6.4 oz (80.9 kg)   SpO2 99%   BMI 31.60 kg/m   General:  Awake. Alert. No acute distress. Obese. Integument:  Warm & dry. No rash on exposed skin. No bruising on exposed skin. Extremities:  No cyanosis or clubbing.  HEENT:  Moist mucus membranes. No significant nasal turbinate swelling. No scleral icterus or injection. Cardiovascular:  Regular rate. Trace right lower extremity edema. Normal S1 & S2.  Pulmonary:  Clear to auscultation bilaterally. Normal work of breathing on room air. Speaking in complete sentences. Abdomen: Soft. Normal bowel sounds. Mildly protuberant. Musculoskeletal:  Normal bulk and tone. No joint deformity or effusion appreciated.  PFT 10/11/16: FVC 2.63 L (99%) FEV1 2.18 L (109%) FEV1/FVC 0.83 FEF 25-75 2.83 L (182%) negative bronchodilator response TLC 5.66 L (115%) RV 131% ERV 19% DLCO corrected 74%  6MWT 11/04/16:  Walked 168 meters / Baseline Sat 99% on RA / nadir Sat 91% on RA (walked with rolling walker)  IMAGING CTA CHEST 03/17/16 (previously reviewed by me):  Bilateral pulmonary embolism extending from Main pulmonary artery to all lobes of both lungs. Positive for acute pulmonary embolism with RV/LV ratio 2.2.  CARDIAC TTE (03/21/16): LV normal in size with mild LVH. EF 60-65%. No regional wall motion abnormalities & grade 1 diastolic dysfunction. LA & RA normal in size. RV normal in size and function. No aortic stenosis or regurgitation. Aortic root normal in size. No mitral stenosis or regurgitation. Trivial pulmonic regurgitation. Trivial tricuspid regurgitation. No pericardial effusion.  LHC (03/16/16): Nonobstructive CAD w/ mid-LAD irregularity. Normal LV function.  LABS 10/03/16 CBC: 10.0/12.8/30.2/171 BMP: 137/60.8/101/26/10/0.84/118/9.4  03/17/16 HITT antibody: 0.184 (negative) D-dimer: 3.46 Fibrinogen: 430 INR: 1.06 PTT: 26    Assessment & Plan:  76 y.o. female with acute  pulmonary embolism likely provoked due to left heart catheterization and hormone replacement therapy. Patient's pulmonary function testing performed in June shows normal spirometry without a significant bronchodilator response and normal carbon monoxide diffusion capacity. Her lung volumes do show some air trapping but this is of unclear significance. The patient's walk test today demonstrates a limitation with her knee in terms of distance but also demonstrates a significant desaturation. Despite this she has no oxygen requirement with ambulation. The patient seems to be tolerating her systemic anticoagulation well with no signs of active bleeding. Additionally, she is planned for a knee replacement in September and requests perioperative pulmonary risk assessment. I instructed the patient contact my office if she had any new breathing problems or questions before her next appointment. I reassured the patient that we are available perioperatively as his hematology to assist in her care if desired.  1. Acute pulmonary embolism:  Patient continuing on systemic anticoagulation with brief interruption to allow for right knee replacement. Plan to reassess with d-dimer before transitioning to either prophylactic Xarelto or full dose  aspirin in November. 2. Dyspnea: Resolved. 3. Prior thrombocytopenia: Resolved. Unlikely to represent HIT. Heparin allergy removed from patient's medical record. 4. Surgical risk assessment: Patient is a moderate risk for perioperative pulmonary complications, specifically recurrence of a DVT and pulmonary embolism with her proposed right knee replacement. This should not preclude her ability to undergo the surgery. I would recommend transitioning from Xarelto to heparin infusion managed by pharmacy preoperatively. I also recommend restarting heparin drip for systemic anticoagulation as soon as possible postoperatively. If there are no signs of active bleeding or further complications  the patient could transition back to Xarelto 48 hours postop. An remain on systemic anticoagulation with Xarelto until she sees me in office. Recommend early ambulation postoperatively and pulmonary toilette with incentive spirometry to minimize any atelectasis.  5. Follow-up: Return to clinic in 3 months or sooner if needed.  Sonia Baller Ashok Cordia, M.D. [facility] Pager:  [phone] After 3pm or if no response, call [phone] 5:07 PM 11/04/16

## 2016-11-04 NOTE — Patient Instructions (Signed)
   I am sending your note today to your orthopedic surgeon.  We will plan on starting you back on Xarelto/Anticoagulation as soon as possible after your knee surgery to make sure you don't have another clot.  Call me if you have any questions or concerns before your next appointment.

## 2016-11-09 ENCOUNTER — Telehealth: Payer: Self-pay | Admitting: Pulmonary Disease

## 2016-11-11 NOTE — Telephone Encounter (Signed)
A surgical clearance form and JN 11/04/16 OV note was faxed to [facility] at [phone]. A confirmation fax sheet was received. Nothing further is needed.

## 2016-11-29 ENCOUNTER — Ambulatory Visit: Payer: Self-pay | Admitting: Orthopedic Surgery

## 2016-11-29 NOTE — Progress Notes (Signed)
Please place orders in EPIC as patient is being scheduled for a pre-op appointment! Thank you! 

## 2016-11-30 ENCOUNTER — Telehealth: Payer: Self-pay | Admitting: Cardiology

## 2016-11-30 ENCOUNTER — Telehealth: Payer: Self-pay | Admitting: Pulmonary Disease

## 2016-11-30 NOTE — Telephone Encounter (Signed)
New Message  Pt call requesting to speak with Rn about getting surgical clearance approval sent over to the office requiring. Please call back to discuss

## 2016-11-30 NOTE — Telephone Encounter (Signed)
Pt is aware I have re-faxed records via EPIC to Dr Alusio's office attn [facility] McBride-surgery coordinator for MD.

## 2016-11-30 NOTE — Telephone Encounter (Signed)
Pre-op clearance OV dated 10-26-16  faxed via Epic,again to Dr Wynelle Link, for surgery in September. Pt states to send directly to [employer] fax [phone], faxed again via EPIC as requested.

## 2016-12-14 NOTE — Patient Instructions (Addendum)
Michele Thompson  12/14/2016   Your procedure is scheduled on: 12-27-16  Report to [facility] Main  Entrance Report to admitting at 6:55 AM F  Call this number if you have problems the morning of surgery  [phone]   Remember: ONLY 1 PERSON MAY GO WITH YOU TO SHORT STAY TO GET  READY MORNING OF [place].  Do not eat food or drink liquids :After Midnight.     Take these medicines the morning of surgery with A SIP OF WATER: Levothyroxine (Synthroid)                                You may not have any metal on your body including hair pins and              piercings  Do not wear jewelry, make-up, lotions, powders or perfumes, deodorant             Do not wear nail polish.  Do not shave  48 hours prior to surgery.              Men may shave face and neck.   Do not bring valuables to the hospital. [facility].  Contacts, dentures or bridgework may not be worn into surgery.  Leave suitcase in the car. After surgery it may be brought to your room.                 Please read over the following fact sheets you were given: _____________________________________________________________________             [facility] - Preparing for Surgery Before surgery, you can play an important role.  Because skin is not sterile, your skin needs to be as free of germs as possible.  You can reduce the number of germs on your skin by washing with CHG (chlorahexidine gluconate) soap before surgery.  CHG is an antiseptic cleaner which kills germs and bonds with the skin to continue killing germs even after washing. Please DO NOT use if you have an allergy to CHG or antibacterial soaps.  If your skin becomes reddened/irritated stop using the CHG and inform your nurse when you arrive at Short Stay. Do not shave (including legs and underarms) for at least 48 hours prior to the first CHG shower.  You may shave your face/neck. Please  follow these instructions carefully:  1.  Shower with CHG Soap the night before surgery and the  morning of Surgery.  2.  If you choose to wash your hair, wash your hair first as usual with your  normal  shampoo.  3.  After you shampoo, rinse your hair and body thoroughly to remove the  shampoo.                           4.  Use CHG as you would any other liquid soap.  You can apply chg directly  to the skin and wash                       Gently with a scrungie or clean washcloth.  5.  Apply the CHG Soap to your body ONLY FROM THE  NECK DOWN.   Do not use on face/ open                           Wound or open sores. Avoid contact with eyes, ears mouth and genitals (private parts).                       Wash face,  Genitals (private parts) with your normal soap.             6.  Wash thoroughly, paying special attention to the area where your surgery  will be performed.  7.  Thoroughly rinse your body with warm water from the neck down.  8.  DO NOT shower/wash with your normal soap after using and rinsing off  the CHG Soap.                9.  Pat yourself dry with a clean towel.            10.  Wear clean pajamas.            11.  Place clean sheets on your bed the night of your first shower and do not  sleep with pets. Day of Surgery : Do not apply any lotions/deodorants the morning of surgery.  Please wear clean clothes to the hospital/surgery center.  FAILURE TO FOLLOW THESE INSTRUCTIONS MAY RESULT IN THE CANCELLATION OF YOUR SURGERY PATIENT SIGNATURE_________________________________  NURSE SIGNATURE__________________________________  ________________________________________________________________________   Adam Phenix  An incentive spirometer is a tool that can help keep your lungs clear and active. This tool measures how well you are filling your lungs with each breath. Taking long deep breaths may help reverse or decrease the chance of developing breathing (pulmonary) problems  (especially infection) following:  A long period of time when you are unable to move or be active. BEFORE THE PROCEDURE   If the spirometer includes an indicator to show your best effort, your nurse or respiratory therapist will set it to a desired goal.  If possible, sit up straight or lean slightly forward. Try not to slouch.  Hold the incentive spirometer in an upright position. INSTRUCTIONS FOR USE  1. Sit on the edge of your bed if possible, or sit up as far as you can in bed or on a chair. 2. Hold the incentive spirometer in an upright position. 3. Breathe out normally. 4. Place the mouthpiece in your mouth and seal your lips tightly around it. 5. Breathe in slowly and as deeply as possible, raising the piston or the ball toward the top of the column. 6. Hold your breath for 3-5 seconds or for as long as possible. Allow the piston or ball to fall to the bottom of the column. 7. Remove the mouthpiece from your mouth and breathe out normally. 8. Rest for a few seconds and repeat Steps 1 through 7 at least 10 times every 1-2 hours when you are awake. Take your time and take a few normal breaths between deep breaths. 9. The spirometer may include an indicator to show your best effort. Use the indicator as a goal to work toward during each repetition. 10. After each set of 10 deep breaths, practice coughing to be sure your lungs are clear. If you have an incision (the cut made at the time of surgery), support your incision when coughing by placing a pillow or rolled up towels firmly against it. Once you are able  to get out of bed, walk around indoors and cough well. You may stop using the incentive spirometer when instructed by your caregiver.  RISKS AND COMPLICATIONS  Take your time so you do not get dizzy or light-headed.  If you are in pain, you may need to take or ask for pain medication before doing incentive spirometry. It is harder to take a deep breath if you are having  pain. AFTER USE  Rest and breathe slowly and easily.  It can be helpful to keep track of a log of your progress. Your caregiver can provide you with a simple table to help with this. If you are using the spirometer at home, follow these instructions: [place] IF:   You are having difficultly using the spirometer.  You have trouble using the spirometer as often as instructed.  Your pain medication is not giving enough relief while using the spirometer.  You develop fever of 100.5 F (38.1 C) or higher. SEEK IMMEDIATE MEDICAL CARE IF:   You cough up bloody sputum that had not been present before.  You develop fever of 102 F (38.9 C) or greater.  You develop worsening pain at or near the incision site. MAKE SURE YOU:   Understand these instructions.  Will watch your condition.  Will get help right away if you are not doing well or get worse. Document Released: 08/16/2006 Document Revised: 06/28/2011 Document Reviewed: 10/17/2006 ExitCare Patient Information 2014 ExitCare, [place].   ________________________________________________________________________  WHAT IS A BLOOD TRANSFUSION? Blood Transfusion Information  A transfusion is the replacement of blood or some of its parts. Blood is made up of multiple cells which provide different functions.  Red blood cells carry oxygen and are used for blood loss replacement.  White blood cells fight against infection.  Platelets control bleeding.  Plasma helps clot blood.  Other blood products are available for specialized needs, such as hemophilia or other clotting disorders. BEFORE THE TRANSFUSION  Who gives blood for transfusions?   Healthy volunteers who are fully evaluated to make sure their blood is safe. This is blood bank blood. Transfusion therapy is the safest it has ever been in the practice of medicine. Before blood is taken from a donor, a complete history is taken to make sure that person has no history  of diseases nor engages in risky social behavior (examples are intravenous drug use or sexual activity with multiple partners). The donor's travel history is screened to minimize risk of transmitting infections, such as malaria. The donated blood is tested for signs of infectious diseases, such as HIV and hepatitis. The blood is then tested to be sure it is compatible with you in order to minimize the chance of a transfusion reaction. If you or a relative donates blood, this is often done in anticipation of surgery and is not appropriate for emergency situations. It takes many days to process the donated blood. RISKS AND COMPLICATIONS Although transfusion therapy is very safe and saves many lives, the main dangers of transfusion include:   Getting an infectious disease.  Developing a transfusion reaction. This is an allergic reaction to something in the blood you were given. Every precaution is taken to prevent this. The decision to have a blood transfusion has been considered carefully by your caregiver before blood is given. Blood is not given unless the benefits outweigh the risks. AFTER THE TRANSFUSION  Right after receiving a blood transfusion, you will usually feel much better and more energetic. This is especially true  if your red blood cells have gotten low (anemic). The transfusion raises the level of the red blood cells which carry oxygen, and this usually causes an energy increase.  The nurse administering the transfusion will monitor you carefully for complications. HOME CARE INSTRUCTIONS  No special instructions are needed after a transfusion. You may find your energy is better. Speak with your caregiver about any limitations on activity for underlying diseases you may have. SEEK MEDICAL CARE IF:   Your condition is not improving after your transfusion.  You develop redness or irritation at the intravenous (IV) site. SEEK IMMEDIATE MEDICAL CARE IF:  Any of the following symptoms  occur over the next 12 hours:  Shaking chills.  You have a temperature by mouth above 102 F (38.9 C), not controlled by medicine.  Chest, back, or muscle pain.  People around you feel you are not acting correctly or are confused.  Shortness of breath or difficulty breathing.  Dizziness and fainting.  You get a rash or develop hives.  You have a decrease in urine output.  Your urine turns a dark color or changes to pink, red, or brown. Any of the following symptoms occur over the next 10 days:  You have a temperature by mouth above 102 F (38.9 C), not controlled by medicine.  Shortness of breath.  Weakness after normal activity.  The white part of the eye turns yellow (jaundice).  You have a decrease in the amount of urine or are urinating less often.  Your urine turns a dark color or changes to pink, red, or brown. Document Released: 04/02/2000 Document Revised: 06/28/2011 Document Reviewed: 11/20/2007 [facility] Patient Information 2014 [place], [place].  _______________________________________________________________________

## 2016-12-14 NOTE — Progress Notes (Signed)
12-14-16 Spoke to Orson Slick at Dr. Anne Fu office regarding pulmonary's recommendation of transitioning from Xarelto to Heparin infusion preoperatively. Judeen Hammans indicated that she will send a message to [place], [place] to address pulmonary's recommendation

## 2016-12-14 NOTE — Progress Notes (Addendum)
11-04-16 Pulmon/Cardio clearance on chart with recommendation to transition from Xarelto to Heparin preoperatively from Dr. Ashok Cordia on chart.  10-08-16 Surgical clearance from Dr. Sabra Heck on chart  10-26-16 [facility]) EKG, cardiac clearance from [facility] PA-C in office note   03-21-16 (EPIC) ECHO EF 60-65% no stenosis, no regurgitation

## 2016-12-15 ENCOUNTER — Encounter (HOSPITAL_COMMUNITY): Payer: Self-pay

## 2016-12-15 ENCOUNTER — Encounter (HOSPITAL_COMMUNITY)
Admission: RE | Admit: 2016-12-15 | Discharge: 2016-12-15 | Disposition: A | Discharge status: Home / Self Care | Payer: Medicare Other | Source: Ambulatory Visit | Attending: Orthopedic Surgery | Admitting: Orthopedic Surgery

## 2016-12-15 DIAGNOSIS — Z01818 Encounter for other preprocedural examination: Secondary | ICD-10-CM | POA: Insufficient documentation

## 2016-12-15 DIAGNOSIS — M1711 Unilateral primary osteoarthritis, right knee: Secondary | ICD-10-CM | POA: Diagnosis not present

## 2016-12-15 LAB — COMPREHENSIVE METABOLIC PANEL
ALT: 16 U/L (ref 14–54)
AST: 31 U/L (ref 15–41)
Albumin: 4.1 g/dL (ref 3.5–5.0)
Alkaline Phosphatase: 76 U/L (ref 38–126)
Anion gap: 7 (ref 5–15)
BUN: 14 mg/dL (ref 6–20)
CO2: 27 mmol/L (ref 22–32)
Calcium: 9.3 mg/dL (ref 8.9–10.3)
Chloride: 103 mmol/L (ref 101–111)
Creatinine, Ser: 0.78 mg/dL (ref 0.44–1.00)
GFR calc Af Amer: >60 mL/min (ref >60)
GFR calc non Af Amer: >60 mL/min (ref >60)
Glucose, Bld: 98 mg/dL (ref 65–99)
Potassium: 3.6 mmol/L (ref 3.5–5.1)
Sodium: 137 mmol/L (ref 135–145)
Total Bilirubin: 0.6 mg/dL (ref 0.3–1.2)
Total Protein: 7 g/dL (ref 6.5–8.1)

## 2016-12-15 LAB — CBC
HCT: 38.1 % (ref 36.0–46.0)
Hemoglobin: 12.8 g/dL (ref 12.0–15.0)
MCH: 30.5 pg (ref 26.0–34.0)
MCHC: 33.6 g/dL (ref 30.0–36.0)
MCV: 90.9 fL (ref 78.0–100.0)
Platelets: 185 10*3/uL (ref 150–400)
RBC: 4.19 MIL/uL (ref 3.87–5.11)
RDW: 12.3 % (ref 11.5–15.5)
WBC: 5.1 10*3/uL (ref 4.0–10.5)

## 2016-12-15 LAB — PROTIME-INR
INR: 1.09
Prothrombin Time: 14.1 seconds (ref 11.4–15.2)

## 2016-12-15 LAB — SURGICAL PCR SCREEN
MRSA, PCR: NEGATIVE
Staphylococcus aureus: NEGATIVE

## 2016-12-15 LAB — APTT: aPTT: 29 seconds (ref 24–36)

## 2016-12-20 ENCOUNTER — Ambulatory Visit: Payer: Self-pay | Admitting: Orthopedic Surgery

## 2016-12-20 NOTE — H&P (Signed)
Michele Thompson DOB: [birth year] Widowed / Language: Cleophus Molt / Race: White Female Date of admission: December 27, 2016 Chief complaint: Right knee pain History of Present Illness  The patient is a 76 year old female who comes in  for a preoperative History and Physical. The patient is scheduled for a right total knee arthroplasty to be performed by Dr. Dione Plover. Aluisio, MD at [facility] on 12/27/2016 . Please see the hospital record for complete dictated history and physical. The patient was seen as a second opinion from [employer]. The patient reports right knee symptoms including: pain which began year(s) ago following a specific injury (skiing injury initially approximately 40 years ago). Prior to being seen the patient was previously evaluated by Dr. Latanya Maudlin. Past treatment for this problem has included intra-articular injection of corticosteroids this past July along with Visco in October that did not provide relief of symptoms, and physical therapy. Symptoms are reported to be located in the right knee and include knee pain (more on the medial side, notes constant pain), swelling and instability. Current treatment includes application of ice, use of a walker (now a cane) and non-opioid analgesics (Tylenol). She had problems with the right knee for a while now, getting much worse over the past six months to a year. She has not had any recent injury leading to this. She did have a ski injury about 40 years ago, but it is just in the past year or so that the knee has gotten a lot worse. She does get swelling at times. It hurts with all activities. Generally, it is little better with rest. She has had the steroid injection and viscosupplement injections by Dr. Rhona Raider, but did not provide any significant relief. The knee is definitely limiting what she can and cannot do. She is using a cane to ambulate because of the knee. It has slowed her down significantly. Her left knee does not hurt. Patient  reports increased pain and swelling in the right knee after going for a long walk recently. She has reached a point where she would like to proceed with surgery at this time. They have been treated conservatively in the past for the above stated problem and despite conservative measures, they continue to have progressive pain and severe functional limitations and dysfunction. They have failed non-operative management including home exercise, medications, and injections. It is felt that they would benefit from undergoing total joint replacement. Risks and benefits of the procedure have been discussed with the patient and they elect to proceed with surgery. There are no active contraindications to surgery such as ongoing infection or rapidly progressive neurological disease.  Medical History Impaired Hearing  Tinnitus  Hypertension  Myocardial infarction  November 2017 Pulmonary Embolism  November 2017 Hemorrhoids  Coronary Artery Disease/Heart Disease  Osteoarthritis  Menopause    Allergies  No Known Drug Allergies  Family History Cerebrovascular Accident  Brother, Father. Congestive Heart Failure  Father, Mother. Heart Disease  Father, Mother. Hypertension  Father.  Social History Children  2 Current work status  retired Furniture conservator/restorer weekly Living situation  live alone Marital status  widowed Never consumed alcohol  07/01/2016: Never consumed alcohol No history of drug/alcohol rehab  Not under pain contract  Number of flights of stairs before winded  2-3 Tobacco / smoke exposure  07/01/2016: no Tobacco use  Never smoker. 07/01/2016 Post-Surgical Plans  [facility].  Medication History Atorvastatin Calcium (40MG   Tablet, Oral) Active. HydroCHLOROthiazide (12.5MG  Capsule, Oral) Active. Levothyroxine Sodium (100MCG Tablet, Oral) Active. Lisinopril (40MG  Tablet,  Oral) Active. Rivaroxaban (20MG  Tablet, Oral) Active. Senna (8.6MG  Capsule, Oral) Active. Vitamin D3 (1000UNIT Tablet, Oral) Active. MiraLax (Oral) Active.  Past Surgical History Cardiac Cath  Date: 03/14/2016.   Review of Systems  General Present- Night Sweats. Not Present- Chills, Fatigue, Fever, Memory Loss, Weight Gain and Weight Loss. Skin Not Present- Eczema, Hives, Itching, Lesions and Rash. HEENT Present- Hearing problems and Tinnitus. Not Present- Dentures, Double Vision, Headache, Hearing Loss and Visual Loss. Respiratory Present- Snoring. Not Present- Allergies, Chronic Cough, Coughing up blood, Shortness of breath at rest and Shortness of breath with exertion. Cardiovascular Present- Leg Cramps. Not Present- Chest Pain, Difficulty Breathing Lying Down, Murmur, Palpitations, Racing/skipping heartbeats and Swelling. Gastrointestinal Present- Constipation and Hemorrhoids. Not Present- Abdominal Pain, Bloody Stool, Diarrhea, Difficulty Swallowing, Heartburn, Jaundice, Loss of appetitie, Nausea and Vomiting. Female Genitourinary Not Present- Blood in Urine, Discharge, Flank Pain, Incontinence, Painful Urination, Urgency, Urinary frequency, Urinary Retention, Urinating at Night and Weak urinary stream. Musculoskeletal Present- Joint Stiffness, Morning Stiffness and Muscle Weakness. Not Present- Back Pain, Joint Pain, Joint Swelling, Muscle Pain and Spasms. Neurological Not Present- Blackout spells, Difficulty with balance, Dizziness, Paralysis, Tremor and Weakness. Psychiatric Not Present- Insomnia. Endocrine Present- Hot flashes and Thyroid Problems. Hematology Present- Easy Bleeding and Easy Bruising.  Vitals Weight: 170 lb Height: 63in Body Surface Area: 1.8 m Body Mass Index: 30.11 kg/m  Pulse: 68 (Regular)  Resp.: 16 (Unlabored)  BP: 124/64 (Sitting, Right Arm, Standard)    Physical Exam General Mental Status -Alert, cooperative and good  historian. General Appearance-pleasant, Not in acute distress. Orientation-Oriented X3. Build & Nutrition-Well nourished and Well developed.  Head and Neck Head-normocephalic, atraumatic . Neck Global Assessment - supple, no bruit auscultated on the right, no bruit auscultated on the left.  Eye Pupil - Bilateral-Regular and Round. Motion - Bilateral-EOMI.  Chest and Lung Exam Auscultation Breath sounds - clear at anterior chest wall and clear at posterior chest wall. Adventitious sounds - No Adventitious sounds.  Cardiovascular Auscultation Rhythm - Regular rate and rhythm. Heart Sounds - S1 WNL and S2 WNL. Murmurs & Other Heart Sounds - Auscultation of the heart reveals - No Murmurs.  Abdomen Palpation/Percussion Tenderness - Abdomen is non-tender to palpation. Rigidity (guarding) - Abdomen is soft. Auscultation Auscultation of the abdomen reveals - Bowel sounds normal.  Female Genitourinary Note: Not done, not pertinent to present illness   Musculoskeletal Note: Evaluation of her hips show normal range of motion, no discomfort. There is a very slight swelling in her right thigh. Her right knee has a feeling of an effusion. It is hard to tell if this is effusion or just boggy tissue. Her range is 0 to 125. There is moderate crepitus on range of motion. She is tender medial greater than lateral with no instability.  RADIOGRAPHS x-rays from last visit reviewed and she has had some medial joint space narrowing, not bone-on-bone, but she did have patellofemoral bone-on-bone change. She had x-rays at the hospital which were supine x-rays, which did not show much in the way of arthritis. This showed no acute findings. The x-rays taken here were standing x-rays, thus were much more accurate.     Assessment & Plan  Primary osteoarthritis of right knee (M17.11)  Note:Surgical Plans: Right Total Knee Replacement  Disposition: [facility] where she is a  resident  PCP: Dr. Sabra Heck - Patient has been seen preoperatively and felt  to be stable for surgery. Cards: Dr. Sharyn Lull - Patient has been seen preoperatively and felt to be stable for surgery. Hold the Xarelto 48 hours prior to surgery. Pulm: Dr. Ashok Cordia  Topical TXA - History on MI and PE - Nov. 2017  Anesthesia Issues: None  Patient was instructed on what medications to stop prior to surgery.  Signed electronically by Joelene Millin, III PA-C

## 2016-12-27 ENCOUNTER — Inpatient Hospital Stay (HOSPITAL_COMMUNITY)
Admission: RE | Admit: 2016-12-27 | Discharge: 2016-12-29 | DRG: 470 | Disposition: A | Discharge status: Skilled Nursing Facility | Payer: Medicare Other | Source: Ambulatory Visit | Attending: Orthopedic Surgery | Admitting: Orthopedic Surgery

## 2016-12-27 ENCOUNTER — Encounter (HOSPITAL_COMMUNITY): Payer: Self-pay | Admitting: Certified Registered Nurse Anesthetist

## 2016-12-27 ENCOUNTER — Encounter (HOSPITAL_COMMUNITY)
Admission: RE | Disposition: A | Discharge status: Skilled Nursing Facility | Payer: Self-pay | Source: Ambulatory Visit | Attending: Orthopedic Surgery

## 2016-12-27 ENCOUNTER — Inpatient Hospital Stay (HOSPITAL_COMMUNITY): Payer: Medicare Other

## 2016-12-27 DIAGNOSIS — Z86718 Personal history of other venous thrombosis and embolism: Secondary | ICD-10-CM

## 2016-12-27 DIAGNOSIS — I2510 Atherosclerotic heart disease of native coronary artery without angina pectoris: Secondary | ICD-10-CM | POA: Diagnosis present

## 2016-12-27 DIAGNOSIS — E785 Hyperlipidemia, unspecified: Secondary | ICD-10-CM | POA: Diagnosis present

## 2016-12-27 DIAGNOSIS — I10 Essential (primary) hypertension: Secondary | ICD-10-CM | POA: Diagnosis present

## 2016-12-27 DIAGNOSIS — M1711 Unilateral primary osteoarthritis, right knee: Secondary | ICD-10-CM | POA: Diagnosis present

## 2016-12-27 DIAGNOSIS — E039 Hypothyroidism, unspecified: Secondary | ICD-10-CM | POA: Diagnosis present

## 2016-12-27 DIAGNOSIS — I252 Old myocardial infarction: Secondary | ICD-10-CM | POA: Diagnosis not present

## 2016-12-27 DIAGNOSIS — M179 Osteoarthritis of knee, unspecified: Secondary | ICD-10-CM | POA: Diagnosis present

## 2016-12-27 DIAGNOSIS — Z86711 Personal history of pulmonary embolism: Secondary | ICD-10-CM

## 2016-12-27 DIAGNOSIS — M25561 Pain in right knee: Secondary | ICD-10-CM | POA: Diagnosis present

## 2016-12-27 DIAGNOSIS — Z8249 Family history of ischemic heart disease and other diseases of the circulatory system: Secondary | ICD-10-CM

## 2016-12-27 DIAGNOSIS — M1710 Unilateral primary osteoarthritis, unspecified knee: Secondary | ICD-10-CM | POA: Diagnosis present

## 2016-12-27 HISTORY — PX: TOTAL KNEE ARTHROPLASTY: SHX125

## 2016-12-27 LAB — TYPE AND SCREEN
ABO/RH(D): A POS
Antibody Screen: NEGATIVE

## 2016-12-27 LAB — ABO/RH: ABO/RH(D): A POS

## 2016-12-27 SURGERY — ARTHROPLASTY, KNEE, TOTAL
Anesthesia: Choice | Site: Knee | Laterality: Right

## 2016-12-27 MED ORDER — HYDROMORPHONE HCL-NACL 0.5-0.9 MG/ML-% IV SOSY
0.2500 mg | PREFILLED_SYRINGE | INTRAVENOUS | Status: DC | PRN
  Administered 2016-12-27: 0.5 mg via INTRAVENOUS

## 2016-12-27 MED ORDER — DEXAMETHASONE SODIUM PHOSPHATE 10 MG/ML IJ SOLN
10.0000 mg | Freq: Once | INTRAMUSCULAR | Status: AC
  Administered 2016-12-27: 10 mg via INTRAVENOUS

## 2016-12-27 MED ORDER — MIDAZOLAM HCL 2 MG/2ML IJ SOLN
INTRAMUSCULAR | Status: AC
  Filled 2016-12-27: qty 2

## 2016-12-27 MED ORDER — ACETAMINOPHEN 325 MG PO TABS: 650.0000 mg | ORAL_TABLET | Freq: Four times a day (QID) | ORAL | Status: DC | PRN

## 2016-12-27 MED ORDER — HYDROMORPHONE HCL 2 MG/ML IJ SOLN
INTRAMUSCULAR | Status: AC
  Filled 2016-12-27: qty 1

## 2016-12-27 MED ORDER — METHOCARBAMOL 500 MG PO TABS
500.0000 mg | ORAL_TABLET | Freq: Four times a day (QID) | ORAL | Status: DC | PRN
  Administered 2016-12-27 – 2016-12-29 (×6): 500 mg via ORAL
  Filled 2016-12-27 (×6): qty 1

## 2016-12-27 MED ORDER — FENTANYL CITRATE (PF) 100 MCG/2ML IJ SOLN
INTRAMUSCULAR | Status: AC
  Filled 2016-12-27: qty 2

## 2016-12-27 MED ORDER — SODIUM CHLORIDE 0.9 % IJ SOLN
INTRAMUSCULAR | Status: DC | PRN
  Administered 2016-12-27: 60 mL via INTRAVENOUS

## 2016-12-27 MED ORDER — EPHEDRINE 5 MG/ML INJ
INTRAVENOUS | Status: AC
  Filled 2016-12-27: qty 10

## 2016-12-27 MED ORDER — MENTHOL 3 MG MT LOZG
1.0000 | LOZENGE | OROMUCOSAL | Status: DC | PRN
  Administered 2016-12-28: 3 mg via ORAL
  Filled 2016-12-27: qty 9

## 2016-12-27 MED ORDER — MIDAZOLAM HCL 2 MG/2ML IJ SOLN
INTRAMUSCULAR | Status: AC
  Administered 2016-12-27: 1.5 mg via INTRAVENOUS
  Filled 2016-12-27: qty 2

## 2016-12-27 MED ORDER — TRANEXAMIC ACID 1000 MG/10ML IV SOLN
2000.0000 mg | Freq: Once | INTRAVENOUS | Status: DC
  Filled 2016-12-27: qty 20

## 2016-12-27 MED ORDER — LIDOCAINE 2% (20 MG/ML) 5 ML SYRINGE
INTRAMUSCULAR | Status: AC
  Filled 2016-12-27: qty 5

## 2016-12-27 MED ORDER — POLYETHYLENE GLYCOL 3350 17 G PO PACK
17.0000 g | PACK | Freq: Every day | ORAL | Status: DC | PRN
  Administered 2016-12-27: 17 g via ORAL
  Filled 2016-12-27: qty 1

## 2016-12-27 MED ORDER — RIVAROXABAN 10 MG PO TABS
20.0000 mg | ORAL_TABLET | Freq: Every day | ORAL | Status: DC
  Administered 2016-12-28 – 2016-12-29 (×2): 20 mg via ORAL
  Filled 2016-12-27 (×2): qty 2

## 2016-12-27 MED ORDER — SODIUM CHLORIDE 0.9 % IR SOLN
Status: DC | PRN
  Administered 2016-12-27: 1000 mL

## 2016-12-27 MED ORDER — ACETAMINOPHEN 10 MG/ML IV SOLN
1000.0000 mg | Freq: Once | INTRAVENOUS | Status: AC
  Administered 2016-12-27: 1000 mg via INTRAVENOUS

## 2016-12-27 MED ORDER — TRAMADOL HCL 50 MG PO TABS: 50.0000 mg | ORAL_TABLET | Freq: Four times a day (QID) | ORAL | Status: DC | PRN

## 2016-12-27 MED ORDER — CEFAZOLIN SODIUM-DEXTROSE 2-4 GM/100ML-% IV SOLN
2.0000 g | INTRAVENOUS | Status: AC
  Administered 2016-12-27: 2 g via INTRAVENOUS

## 2016-12-27 MED ORDER — FENTANYL CITRATE (PF) 100 MCG/2ML IJ SOLN
INTRAMUSCULAR | Status: AC
  Administered 2016-12-27: 50 ug via INTRAVENOUS
  Filled 2016-12-27: qty 2

## 2016-12-27 MED ORDER — OXYCODONE HCL 5 MG PO TABS
5.0000 mg | ORAL_TABLET | ORAL | Status: DC | PRN
Start: 2016-12-27 — End: 2016-12-29
  Administered 2016-12-28: 16:00:00 10 mg via ORAL
  Administered 2016-12-28: 5 mg via ORAL
  Administered 2016-12-28 – 2016-12-29 (×8): 10 mg via ORAL
  Filled 2016-12-27 (×9): qty 2
  Filled 2016-12-27: qty 1
  Filled 2016-12-27 (×2): qty 2

## 2016-12-27 MED ORDER — METOCLOPRAMIDE HCL 5 MG/ML IJ SOLN
5.0000 mg | Freq: Three times a day (TID) | INTRAMUSCULAR | Status: DC | PRN
  Administered 2016-12-27: 10 mg via INTRAVENOUS
  Filled 2016-12-27: qty 2

## 2016-12-27 MED ORDER — LACTATED RINGERS IV SOLN
INTRAVENOUS | Status: DC
  Administered 2016-12-27 (×2): via INTRAVENOUS

## 2016-12-27 MED ORDER — METHOCARBAMOL 1000 MG/10ML IJ SOLN
500.0000 mg | Freq: Four times a day (QID) | INTRAMUSCULAR | Status: DC | PRN
  Administered 2016-12-27: 500 mg via INTRAVENOUS
  Filled 2016-12-27: qty 550

## 2016-12-27 MED ORDER — STERILE WATER FOR IRRIGATION IR SOLN
Status: DC | PRN
  Administered 2016-12-27: 2000 mL

## 2016-12-27 MED ORDER — DEXAMETHASONE SODIUM PHOSPHATE 10 MG/ML IJ SOLN
INTRAMUSCULAR | Status: AC
  Filled 2016-12-27: qty 1

## 2016-12-27 MED ORDER — BUPIVACAINE LIPOSOME 1.3 % IJ SUSP
20.0000 mL | Freq: Once | INTRAMUSCULAR | Status: DC
  Filled 2016-12-27: qty 20

## 2016-12-27 MED ORDER — CEFAZOLIN SODIUM-DEXTROSE 2-4 GM/100ML-% IV SOLN
2.0000 g | Freq: Four times a day (QID) | INTRAVENOUS | Status: AC
  Administered 2016-12-27 (×2): 2 g via INTRAVENOUS
  Filled 2016-12-27 (×2): qty 100

## 2016-12-27 MED ORDER — SUCCINYLCHOLINE CHLORIDE 200 MG/10ML IV SOSY
PREFILLED_SYRINGE | INTRAVENOUS | Status: DC | PRN
  Administered 2016-12-27: 100 mg via INTRAVENOUS

## 2016-12-27 MED ORDER — BISACODYL 10 MG RE SUPP: 10.0000 mg | Freq: Every day | RECTAL | Status: DC | PRN

## 2016-12-27 MED ORDER — HYDROCHLOROTHIAZIDE 12.5 MG PO CAPS
12.5000 mg | ORAL_CAPSULE | Freq: Every day | ORAL | Status: DC
  Administered 2016-12-28 – 2016-12-29 (×2): 12.5 mg via ORAL
  Filled 2016-12-27 (×2): qty 1

## 2016-12-27 MED ORDER — ATORVASTATIN CALCIUM 40 MG PO TABS
40.0000 mg | ORAL_TABLET | Freq: Every day | ORAL | Status: DC
  Administered 2016-12-28: 40 mg via ORAL
  Filled 2016-12-27 (×2): qty 1

## 2016-12-27 MED ORDER — GLYCOPYRROLATE 0.2 MG/ML IV SOSY
PREFILLED_SYRINGE | INTRAVENOUS | Status: DC | PRN
  Administered 2016-12-27: .1 mg via INTRAVENOUS

## 2016-12-27 MED ORDER — EPHEDRINE SULFATE 50 MG/ML IJ SOLN
INTRAMUSCULAR | Status: DC | PRN
  Administered 2016-12-27: 10 mg via INTRAVENOUS

## 2016-12-27 MED ORDER — 0.9 % SODIUM CHLORIDE (POUR BTL) OPTIME
TOPICAL | Status: DC | PRN
  Administered 2016-12-27: 1000 mL

## 2016-12-27 MED ORDER — SUCCINYLCHOLINE CHLORIDE 200 MG/10ML IV SOSY
PREFILLED_SYRINGE | INTRAVENOUS | Status: AC
  Filled 2016-12-27: qty 10

## 2016-12-27 MED ORDER — MORPHINE SULFATE (PF) 2 MG/ML IV SOLN
1.0000 mg | INTRAVENOUS | Status: DC | PRN
  Administered 2016-12-27 – 2016-12-28 (×3): 1 mg via INTRAVENOUS
  Filled 2016-12-27 (×3): qty 1

## 2016-12-27 MED ORDER — CEFAZOLIN SODIUM-DEXTROSE 2-4 GM/100ML-% IV SOLN
INTRAVENOUS | Status: AC
  Filled 2016-12-27: qty 100

## 2016-12-27 MED ORDER — PROPOFOL 10 MG/ML IV BOLUS
INTRAVENOUS | Status: DC | PRN
  Administered 2016-12-27: 150 mg via INTRAVENOUS

## 2016-12-27 MED ORDER — SODIUM CHLORIDE 0.9 % IV SOLN
INTRAVENOUS | Status: DC
  Administered 2016-12-27: 15:00:00 via INTRAVENOUS

## 2016-12-27 MED ORDER — HYDROMORPHONE HCL 1 MG/ML IJ SOLN
INTRAMUSCULAR | Status: DC | PRN
  Administered 2016-12-27: 1 mg via INTRAVENOUS
  Administered 2016-12-27 (×2): 0.5 mg via INTRAVENOUS

## 2016-12-27 MED ORDER — ONDANSETRON HCL 4 MG PO TABS: 4.0000 mg | ORAL_TABLET | Freq: Four times a day (QID) | ORAL | Status: DC | PRN

## 2016-12-27 MED ORDER — FLEET ENEMA 7-19 GM/118ML RE ENEM: 1.0000 | ENEMA | Freq: Once | RECTAL | Status: DC | PRN

## 2016-12-27 MED ORDER — HYDROMORPHONE HCL-NACL 0.5-0.9 MG/ML-% IV SOSY
PREFILLED_SYRINGE | INTRAVENOUS | Status: AC
  Filled 2016-12-27: qty 1

## 2016-12-27 MED ORDER — DOCUSATE SODIUM 100 MG PO CAPS
100.0000 mg | ORAL_CAPSULE | Freq: Two times a day (BID) | ORAL | Status: DC
  Administered 2016-12-27 – 2016-12-29 (×4): 100 mg via ORAL
  Filled 2016-12-27 (×4): qty 1

## 2016-12-27 MED ORDER — FENTANYL CITRATE (PF) 100 MCG/2ML IJ SOLN
INTRAMUSCULAR | Status: DC | PRN
  Administered 2016-12-27: 50 ug via INTRAVENOUS
  Administered 2016-12-27: 100 ug via INTRAVENOUS
  Administered 2016-12-27 (×2): 50 ug via INTRAVENOUS

## 2016-12-27 MED ORDER — SODIUM CHLORIDE 0.9 % IJ SOLN
INTRAMUSCULAR | Status: AC
  Filled 2016-12-27: qty 50

## 2016-12-27 MED ORDER — SODIUM CHLORIDE 0.9 % IJ SOLN
INTRAMUSCULAR | Status: AC
  Filled 2016-12-27: qty 10

## 2016-12-27 MED ORDER — ONDANSETRON HCL 4 MG/2ML IJ SOLN
INTRAMUSCULAR | Status: AC
  Filled 2016-12-27: qty 2

## 2016-12-27 MED ORDER — METOCLOPRAMIDE HCL 5 MG PO TABS: 5.0000 mg | ORAL_TABLET | Freq: Three times a day (TID) | ORAL | Status: DC | PRN

## 2016-12-27 MED ORDER — LEVOTHYROXINE SODIUM 100 MCG PO TABS
100.0000 ug | ORAL_TABLET | Freq: Every day | ORAL | Status: DC
  Administered 2016-12-28 – 2016-12-29 (×2): 100 ug via ORAL
  Filled 2016-12-27 (×2): qty 1

## 2016-12-27 MED ORDER — ACETAMINOPHEN 10 MG/ML IV SOLN
INTRAVENOUS | Status: AC
  Filled 2016-12-27: qty 100

## 2016-12-27 MED ORDER — FENTANYL CITRATE (PF) 250 MCG/5ML IJ SOLN
INTRAMUSCULAR | Status: AC
  Filled 2016-12-27: qty 5

## 2016-12-27 MED ORDER — DIPHENHYDRAMINE HCL 12.5 MG/5ML PO ELIX: 12.5000 mg | ORAL_SOLUTION | ORAL | Status: DC | PRN

## 2016-12-27 MED ORDER — CHLORHEXIDINE GLUCONATE 4 % EX LIQD: 60.0000 mL | Freq: Once | CUTANEOUS | Status: DC

## 2016-12-27 MED ORDER — PHENOL 1.4 % MT LIQD: 1.0000 | OROMUCOSAL | Status: DC | PRN

## 2016-12-27 MED ORDER — PROPOFOL 10 MG/ML IV BOLUS
INTRAVENOUS | Status: AC
  Filled 2016-12-27: qty 40

## 2016-12-27 MED ORDER — ACETAMINOPHEN 650 MG RE SUPP: 650.0000 mg | Freq: Four times a day (QID) | RECTAL | Status: DC | PRN

## 2016-12-27 MED ORDER — ONDANSETRON HCL 4 MG/2ML IJ SOLN
4.0000 mg | Freq: Four times a day (QID) | INTRAMUSCULAR | Status: DC | PRN
  Administered 2016-12-27: 4 mg via INTRAVENOUS
  Filled 2016-12-27: qty 2

## 2016-12-27 MED ORDER — LIDOCAINE HCL (CARDIAC) 20 MG/ML IV SOLN
INTRAVENOUS | Status: DC | PRN
  Administered 2016-12-27: 50 mg via INTRAVENOUS

## 2016-12-27 MED ORDER — MIDAZOLAM HCL 2 MG/2ML IJ SOLN
1.0000 mg | INTRAMUSCULAR | Status: DC | PRN
  Administered 2016-12-27: 1.5 mg via INTRAVENOUS

## 2016-12-27 MED ORDER — ACETAMINOPHEN 500 MG PO TABS
1000.0000 mg | ORAL_TABLET | Freq: Four times a day (QID) | ORAL | Status: AC
  Administered 2016-12-27 – 2016-12-28 (×4): 1000 mg via ORAL
  Filled 2016-12-27 (×4): qty 2

## 2016-12-27 MED ORDER — DEXAMETHASONE SODIUM PHOSPHATE 10 MG/ML IJ SOLN
10.0000 mg | Freq: Once | INTRAMUSCULAR | Status: AC
  Administered 2016-12-28: 11:00:00 10 mg via INTRAVENOUS
  Filled 2016-12-27: qty 1

## 2016-12-27 MED ORDER — BUPIVACAINE LIPOSOME 1.3 % IJ SUSP
INTRAMUSCULAR | Status: DC | PRN
  Administered 2016-12-27: 20 mL

## 2016-12-27 MED ORDER — FENTANYL CITRATE (PF) 100 MCG/2ML IJ SOLN
50.0000 ug | INTRAMUSCULAR | Status: DC | PRN
  Administered 2016-12-27: 50 ug via INTRAVENOUS

## 2016-12-27 MED ORDER — ONDANSETRON HCL 4 MG/2ML IJ SOLN
INTRAMUSCULAR | Status: DC | PRN
  Administered 2016-12-27: 4 mg via INTRAVENOUS

## 2016-12-27 MED ORDER — GLYCOPYRROLATE 0.2 MG/ML IV SOSY
PREFILLED_SYRINGE | INTRAVENOUS | Status: AC
  Filled 2016-12-27: qty 5

## 2016-12-27 MED ORDER — TRANEXAMIC ACID 1000 MG/10ML IV SOLN
INTRAVENOUS | Status: AC | PRN
  Administered 2016-12-27: 2000 mg via TOPICAL

## 2016-12-27 SURGICAL SUPPLY — 51 items
BAG DECANTER FOR FLEXI CONT (MISCELLANEOUS) ×2 IMPLANT
BAG ZIPLOCK 12X15 (MISCELLANEOUS) ×2 IMPLANT
BANDAGE ACE 6X5 VEL STRL LF (GAUZE/BANDAGES/DRESSINGS) ×2 IMPLANT
BLADE SAG 18X100X1.27 (BLADE) ×2 IMPLANT
BLADE SAW SGTL 11.0X1.19X90.0M (BLADE) ×2 IMPLANT
BNDG CONFORM 6X.82 1P STRL (GAUZE/BANDAGES/DRESSINGS) ×2 IMPLANT
BOWL SMART MIX CTS (DISPOSABLE) ×2 IMPLANT
CAP KNEE TOTAL 3 SIGMA ×2 IMPLANT
CEMENT HV SMART SET (Cement) ×4 IMPLANT
COVER SURGICAL LIGHT HANDLE (MISCELLANEOUS) ×2 IMPLANT
CUFF TOURN SGL QUICK 34 (TOURNIQUET CUFF) ×1
CUFF TRNQT CYL 34X4X40X1 (TOURNIQUET CUFF) ×1 IMPLANT
DECANTER SPIKE VIAL GLASS SM (MISCELLANEOUS) ×2 IMPLANT
DRAPE U-SHAPE 47X51 STRL (DRAPES) ×2 IMPLANT
DRSG ADAPTIC 3X8 NADH LF (GAUZE/BANDAGES/DRESSINGS) ×2 IMPLANT
DRSG PAD ABDOMINAL 8X10 ST (GAUZE/BANDAGES/DRESSINGS) ×2 IMPLANT
DURAPREP 26ML APPLICATOR (WOUND CARE) ×2 IMPLANT
ELECT REM PT RETURN 15FT ADLT (MISCELLANEOUS) ×2 IMPLANT
EVACUATOR 1/8 PVC DRAIN (DRAIN) ×2 IMPLANT
GAUZE SPONGE 4X4 12PLY STRL (GAUZE/BANDAGES/DRESSINGS) ×2 IMPLANT
GLOVE BIO SURGEON STRL SZ7.5 (GLOVE) IMPLANT
GLOVE BIO SURGEON STRL SZ8 (GLOVE) ×2 IMPLANT
GLOVE BIOGEL PI IND STRL 6.5 (GLOVE) IMPLANT
GLOVE BIOGEL PI IND STRL 8 (GLOVE) ×1 IMPLANT
GLOVE BIOGEL PI INDICATOR 6.5 (GLOVE)
GLOVE BIOGEL PI INDICATOR 8 (GLOVE) ×1
GLOVE SURG SS PI 6.5 STRL IVOR (GLOVE) IMPLANT
GOWN STRL REUS W/TWL LRG LVL3 (GOWN DISPOSABLE) ×2 IMPLANT
GOWN STRL REUS W/TWL XL LVL3 (GOWN DISPOSABLE) IMPLANT
HANDPIECE INTERPULSE COAX TIP (DISPOSABLE) ×1
IMMOBILIZER KNEE 20 (SOFTGOODS) ×4 IMPLANT
IMMOBILIZER KNEE 20 THIGH 36 (SOFTGOODS) ×1 IMPLANT
MANIFOLD NEPTUNE II (INSTRUMENTS) ×2 IMPLANT
NS IRRIG 1000ML POUR BTL (IV SOLUTION) ×2 IMPLANT
PACK TOTAL KNEE CUSTOM (KITS) ×2 IMPLANT
PAD ABD 8X10 STRL (GAUZE/BANDAGES/DRESSINGS) ×2 IMPLANT
PADDING CAST COTTON 6X4 STRL (CAST SUPPLIES) ×6 IMPLANT
POSITIONER SURGICAL ARM (MISCELLANEOUS) ×2 IMPLANT
SET HNDPC FAN SPRY TIP SCT (DISPOSABLE) ×1 IMPLANT
STRIP CLOSURE SKIN 1/2X4 (GAUZE/BANDAGES/DRESSINGS) ×4 IMPLANT
SUT MNCRL AB 4-0 PS2 18 (SUTURE) ×2 IMPLANT
SUT STRATAFIX 0 PDS 27 VIOLET (SUTURE) ×2
SUT VIC AB 2-0 CT1 27 (SUTURE) ×3
SUT VIC AB 2-0 CT1 TAPERPNT 27 (SUTURE) ×3 IMPLANT
SUTURE STRATFX 0 PDS 27 VIOLET (SUTURE) ×1 IMPLANT
SYR 30ML LL (SYRINGE) ×4 IMPLANT
TAPE STRIPS DRAPE STRL (GAUZE/BANDAGES/DRESSINGS) ×2 IMPLANT
TRAY FOLEY W/METER SILVER 16FR (SET/KITS/TRAYS/PACK) ×2 IMPLANT
WATER STERILE IRR 1000ML POUR (IV SOLUTION) ×4 IMPLANT
WRAP KNEE MAXI GEL POST OP (GAUZE/BANDAGES/DRESSINGS) ×2 IMPLANT
YANKAUER SUCT BULB TIP 10FT TU (MISCELLANEOUS) ×2 IMPLANT

## 2016-12-27 NOTE — NC FL2 (Signed)
[place] LEVEL OF CARE SCREENING TOOL     IDENTIFICATION  Patient Name: Michele Thompson Birthdate: [birth year] Sex: female Admission Date (Current Location): 12/27/2016  [facility] and [place] Number:  Herbalist and Address:  [facility],  [place] [address] [place]      Provider Number: [id number]  Attending Physician Name and Address:  Gaynelle Arabian, MD  Relative Name and Phone Number:       Current Level of Care: Hospital Recommended Level of Care: [place] Prior Approval Number:    Date Approved/Denied:   PASRR Number: [id number]  Discharge Plan: SNF    Current Diagnoses: Patient Active Problem List   Diagnosis Date Noted  . OA (osteoarthritis) of knee 12/27/2016  . Pre-operative cardiovascular examination 10/26/2016  . Chronic anticoagulation 10/26/2016  . Dyspnea 07/28/2016  . History of DVT (deep vein thrombosis) 04/23/2016  . Coronary artery disease involving native heart without angina pectoris 03/31/2016  . Sinus bradycardia 03/31/2016  . NSTEMI (non-ST elevated myocardial infarction) ([place])   . PE (pulmonary thromboembolism) ([place]) 03/17/2016  . Hypothyroidism 03/17/2016  . Rectal bleeding 03/17/2016  . Headache 03/17/2016  . Thrombocytopenia ([place])   . Hyperlipidemia 03/16/2016  . Hypertension 03/16/2016    Orientation RESPIRATION BLADDER Height & Weight     Self, Time, Situation, Place  O2 Indwelling catheter Weight: 81.2 kg (179 lb) Height:  5\' 2"  (157.5 cm)  BEHAVIORAL SYMPTOMS/MOOD NEUROLOGICAL BOWEL NUTRITION STATUS  Other (Comment) (no behaviors)   Continent Diet  AMBULATORY STATUS COMMUNICATION OF NEEDS Skin   Extensive Assist Verbally Surgical wounds                       Personal Care Assistance Level of Assistance  Bathing, Feeding, Dressing Bathing Assistance: Limited assistance Feeding assistance: Independent Dressing Assistance: Limited assistance     Functional  Limitations Info  Sight, Hearing, Speech Sight Info: Impaired Hearing Info: Adequate Speech Info: Adequate    SPECIAL CARE FACTORS FREQUENCY  PT (By licensed PT), OT (By licensed OT)     PT Frequency: 5x wk OT Frequency: 5x wk            Contractures Contractures Info: Not present    Additional Factors Info  Code Status, Allergies Code Status Info: Full Code Allergies Info: NKA           Current Medications (12/27/2016):  This is the current hospital active medication list Current Facility-Administered Medications  Medication Dose Route Frequency Provider Last Rate Last Dose  . 0.9 %  sodium chloride infusion   Intravenous Continuous Gaynelle Arabian, MD 75 mL/hr at 12/27/16 1438    . [START ON 12/28/2016] acetaminophen (TYLENOL) tablet 650 mg  650 mg Oral Q6H PRN Gaynelle Arabian, MD       Or  . Derrill Memo ON 12/28/2016] acetaminophen (TYLENOL) suppository 650 mg  650 mg Rectal Q6H PRN Aluisio, Pilar Plate, MD      . acetaminophen (TYLENOL) tablet 1,000 mg  1,000 mg Oral Q6H Aluisio, Pilar Plate, MD      . atorvastatin (LIPITOR) tablet 40 mg  40 mg Oral q1800 Aluisio, Pilar Plate, MD      . bisacodyl (DULCOLAX) suppository 10 mg  10 mg Rectal Daily PRN Gaynelle Arabian, MD      . ceFAZolin (ANCEF) IVPB 2g/100 mL premix  2 g Intravenous Q6H Aluisio, Pilar Plate, MD      . Derrill Memo ON 12/28/2016] dexamethasone (DECADRON) injection 10 mg  10 mg  Intravenous Once Gaynelle Arabian, MD      . diphenhydrAMINE (BENADRYL) 12.5 MG/5ML elixir 12.5-25 mg  12.5-25 mg Oral Q4H PRN Aluisio, Pilar Plate, MD      . docusate sodium (COLACE) capsule 100 mg  100 mg Oral BID Gaynelle Arabian, MD      . Derrill Memo ON 12/28/2016] hydrochlorothiazide (MICROZIDE) capsule 12.5 mg  12.5 mg Oral Daily Aluisio, Pilar Plate, MD      . HYDROmorphone (DILAUDID) 0.5 MG/ML injection           . [START ON 12/28/2016] levothyroxine (SYNTHROID, LEVOTHROID) tablet 100 mcg  100 mcg Oral QAC breakfast Aluisio, Pilar Plate, MD      . menthol-cetylpyridinium (CEPACOL) lozenge 3  mg  1 lozenge Oral PRN Gaynelle Arabian, MD       Or  . phenol (CHLORASEPTIC) mouth spray 1 spray  1 spray Mouth/Throat PRN Aluisio, Pilar Plate, MD      . methocarbamol (ROBAXIN) tablet 500 mg  500 mg Oral Q6H PRN Gaynelle Arabian, MD       Or  . methocarbamol (ROBAXIN) 500 mg in dextrose 5 % 50 mL IVPB  500 mg Intravenous Q6H PRN Gaynelle Arabian, MD 110 mL/hr at 12/27/16 1204 500 mg at 12/27/16 1204  . metoCLOPramide (REGLAN) tablet 5-10 mg  5-10 mg Oral Q8H PRN Gaynelle Arabian, MD       Or  . metoCLOPramide (REGLAN) injection 5-10 mg  5-10 mg Intravenous Q8H PRN Aluisio, Pilar Plate, MD      . morphine 2 MG/ML injection 1 mg  1 mg Intravenous Q2H PRN Aluisio, Pilar Plate, MD      . ondansetron (ZOFRAN) tablet 4 mg  4 mg Oral Q6H PRN Gaynelle Arabian, MD       Or  . ondansetron (ZOFRAN) injection 4 mg  4 mg Intravenous Q6H PRN Gaynelle Arabian, MD   4 mg at 12/27/16 1438  . oxyCODONE (Oxy IR/ROXICODONE) immediate release tablet 5-10 mg  5-10 mg Oral Q3H PRN Aluisio, Pilar Plate, MD      . polyethylene glycol (MIRALAX / GLYCOLAX) packet 17 g  17 g Oral Daily PRN Gaynelle Arabian, MD      . Derrill Memo ON 12/28/2016] rivaroxaban (XARELTO) tablet 20 mg  20 mg Oral Q breakfast Aluisio, Pilar Plate, MD      . sodium phosphate (FLEET) 7-19 GM/118ML enema 1 enema  1 enema Rectal Once PRN Aluisio, Pilar Plate, MD      . traMADol Veatrice Bourbon) tablet 50-100 mg  50-100 mg Oral Q6H PRN Gaynelle Arabian, MD         Discharge Medications: Please see discharge summary for a list of discharge medications.  Relevant Imaging Results:  Relevant Lab Results:   Additional Information SS # [id number]  , Randall An, LCSW

## 2016-12-27 NOTE — H&P (View-Only) (Signed)
Michele Thompson DOB: 1940/07/05 Widowed / Language: Cleophus Molt / Race: White Female Date of admission: December 27, 2016 Chief complaint: Right knee pain History of Present Illness  The patient is a 76 year old female who comes in  for a preoperative History and Physical. The patient is scheduled for a right total knee arthroplasty to be performed by Dr. Dione Plover. Aluisio, MD at [facility] on 12/27/2016 . Please see the hospital record for complete dictated history and physical. The patient was seen as a second opinion from [employer]. The patient reports right knee symptoms including: pain which began year(s) ago following a specific injury (skiing injury initially approximately 40 years ago). Prior to being seen the patient was previously evaluated by Dr. Latanya Maudlin. Past treatment for this problem has included intra-articular injection of corticosteroids this past July along with Visco in October that did not provide relief of symptoms, and physical therapy. Symptoms are reported to be located in the right knee and include knee pain (more on the medial side, notes constant pain), swelling and instability. Current treatment includes application of ice, use of a walker (now a cane) and non-opioid analgesics (Tylenol). She had problems with the right knee for a while now, getting much worse over the past six months to a year. She has not had any recent injury leading to this. She did have a ski injury about 40 years ago, but it is just in the past year or so that the knee has gotten a lot worse. She does get swelling at times. It hurts with all activities. Generally, it is little better with rest. She has had the steroid injection and viscosupplement injections by Dr. Rhona Raider, but did not provide any significant relief. The knee is definitely limiting what she can and cannot do. She is using a cane to ambulate because of the knee. It has slowed her down significantly. Her left knee does not hurt. Patient  reports increased pain and swelling in the right knee after going for a long walk recently. She has reached a point where she would like to proceed with surgery at this time. They have been treated conservatively in the past for the above stated problem and despite conservative measures, they continue to have progressive pain and severe functional limitations and dysfunction. They have failed non-operative management including home exercise, medications, and injections. It is felt that they would benefit from undergoing total joint replacement. Risks and benefits of the procedure have been discussed with the patient and they elect to proceed with surgery. There are no active contraindications to surgery such as ongoing infection or rapidly progressive neurological disease.  Medical History Impaired Hearing  Tinnitus  Hypertension  Myocardial infarction  November 2017 Pulmonary Embolism  November 2017 Hemorrhoids  Coronary Artery Disease/Heart Disease  Osteoarthritis  Menopause    Allergies  No Known Drug Allergies  Family History Cerebrovascular Accident  Brother, Father. Congestive Heart Failure  Father, Mother. Heart Disease  Father, Mother. Hypertension  Father.  Social History Children  2 Current work status  retired Furniture conservator/restorer weekly Living situation  live alone Marital status  widowed Never consumed alcohol  07/01/2016: Never consumed alcohol No history of drug/alcohol rehab  Not under pain contract  Number of flights of stairs before winded  2-3 Tobacco / smoke exposure  07/01/2016: no Tobacco use  Never smoker. 07/01/2016 Post-Surgical Plans  [facility].  Medication History Atorvastatin Calcium (40MG   Tablet, Oral) Active. HydroCHLOROthiazide (12.5MG  Capsule, Oral) Active. Levothyroxine Sodium (100MCG Tablet, Oral) Active. Lisinopril (40MG  Tablet,  Oral) Active. Rivaroxaban (20MG  Tablet, Oral) Active. Senna (8.6MG  Capsule, Oral) Active. Vitamin D3 (1000UNIT Tablet, Oral) Active. MiraLax (Oral) Active.  Past Surgical History Cardiac Cath  Date: 03/14/2016.   Review of Systems  General Present- Night Sweats. Not Present- Chills, Fatigue, Fever, Memory Loss, Weight Gain and Weight Loss. Skin Not Present- Eczema, Hives, Itching, Lesions and Rash. HEENT Present- Hearing problems and Tinnitus. Not Present- Dentures, Double Vision, Headache, Hearing Loss and Visual Loss. Respiratory Present- Snoring. Not Present- Allergies, Chronic Cough, Coughing up blood, Shortness of breath at rest and Shortness of breath with exertion. Cardiovascular Present- Leg Cramps. Not Present- Chest Pain, Difficulty Breathing Lying Down, Murmur, Palpitations, Racing/skipping heartbeats and Swelling. Gastrointestinal Present- Constipation and Hemorrhoids. Not Present- Abdominal Pain, Bloody Stool, Diarrhea, Difficulty Swallowing, Heartburn, Jaundice, Loss of appetitie, Nausea and Vomiting. Female Genitourinary Not Present- Blood in Urine, Discharge, Flank Pain, Incontinence, Painful Urination, Urgency, Urinary frequency, Urinary Retention, Urinating at Night and Weak urinary stream. Musculoskeletal Present- Joint Stiffness, Morning Stiffness and Muscle Weakness. Not Present- Back Pain, Joint Pain, Joint Swelling, Muscle Pain and Spasms. Neurological Not Present- Blackout spells, Difficulty with balance, Dizziness, Paralysis, Tremor and Weakness. Psychiatric Not Present- Insomnia. Endocrine Present- Hot flashes and Thyroid Problems. Hematology Present- Easy Bleeding and Easy Bruising.  Vitals Weight: 170 lb Height: 63in Body Surface Area: 1.8 m Body Mass Index: 30.11 kg/m  Pulse: 68 (Regular)  Resp.: 16 (Unlabored)  BP: 124/64 (Sitting, Right Arm, Standard)    Physical Exam General Mental Status -Alert, cooperative and good  historian. General Appearance-pleasant, Not in acute distress. Orientation-Oriented X3. Build & Nutrition-Well nourished and Well developed.  Head and Neck Head-normocephalic, atraumatic . Neck Global Assessment - supple, no bruit auscultated on the right, no bruit auscultated on the left.  Eye Pupil - Bilateral-Regular and Round. Motion - Bilateral-EOMI.  Chest and Lung Exam Auscultation Breath sounds - clear at anterior chest wall and clear at posterior chest wall. Adventitious sounds - No Adventitious sounds.  Cardiovascular Auscultation Rhythm - Regular rate and rhythm. Heart Sounds - S1 WNL and S2 WNL. Murmurs & Other Heart Sounds - Auscultation of the heart reveals - No Murmurs.  Abdomen Palpation/Percussion Tenderness - Abdomen is non-tender to palpation. Rigidity (guarding) - Abdomen is soft. Auscultation Auscultation of the abdomen reveals - Bowel sounds normal.  Female Genitourinary Note: Not done, not pertinent to present illness   Musculoskeletal Note: Evaluation of her hips show normal range of motion, no discomfort. There is a very slight swelling in her right thigh. Her right knee has a feeling of an effusion. It is hard to tell if this is effusion or just boggy tissue. Her range is 0 to 125. There is moderate crepitus on range of motion. She is tender medial greater than lateral with no instability.  RADIOGRAPHS x-rays from last visit reviewed and she has had some medial joint space narrowing, not bone-on-bone, but she did have patellofemoral bone-on-bone change. She had x-rays at the hospital which were supine x-rays, which did not show much in the way of arthritis. This showed no acute findings. The x-rays taken here were standing x-rays, thus were much more accurate.     Assessment & Plan  Primary osteoarthritis of right knee (M17.11)  Note:Surgical Plans: Right Total Knee Replacement  Disposition: [facility] where she is a  resident  PCP: Dr. Sabra Heck - Patient has been seen preoperatively and felt  to be stable for surgery. Cards: Dr. Sharyn Lull - Patient has been seen preoperatively and felt to be stable for surgery. Hold the Xarelto 48 hours prior to surgery. Pulm: Dr. Ashok Cordia  Topical TXA - History on MI and PE - Nov. 2017  Anesthesia Issues: None  Patient was instructed on what medications to stop prior to surgery.  Signed electronically by Joelene Millin, III PA-C

## 2016-12-27 NOTE — Anesthesia Postprocedure Evaluation (Signed)
Anesthesia Post Note  Patient: Michele Thompson  Procedure(s) Performed: Procedure(s) (LRB): RIGHT TOTAL KNEE ARTHROPLASTY (Right)     Patient location during evaluation: PACU Anesthesia Type: General Level of consciousness: awake Pain management: pain level controlled Vital Signs Assessment: post-procedure vital signs reviewed and stable Respiratory status: spontaneous breathing Cardiovascular status: stable Anesthetic complications: no    Last Vitals:  Vitals:   12/27/16 1551 12/27/16 1749  BP: (!) 155/63 (!) 150/55  Pulse: (!) 54 (!) 52  Resp: 12 12  Temp: 36.6 C (!) 36.4 C  SpO2: 100% 100%    Last Pain:  Vitals:   12/27/16 1749  TempSrc: Axillary  PainSc:                   

## 2016-12-27 NOTE — Progress Notes (Signed)
PT Cancellation Note  Patient Details Name: Michele Thompson MRN: [id number] DOB: 06-05-1940   Cancelled Treatment:    Reason Eval/Treat Not Completed: Medical issues which prohibited therapy (pt is nauseous. Will follow. )   Philomena Doheny 12/27/2016, 3:13 PM [phone]

## 2016-12-27 NOTE — Progress Notes (Signed)
AssistedDr. Edwards with right, ultrasound guided, adductor canal block. Side rails up, monitors on throughout procedure. See vital signs in flow sheet. Tolerated Procedure well.  

## 2016-12-27 NOTE — Op Note (Signed)
OPERATIVE REPORT-TOTAL KNEE ARTHROPLASTY   Pre-operative diagnosis- Osteoarthritis  Right knee(s)  Post-operative diagnosis- Osteoarthritis Right knee(s)  Procedure-  Right  Total Knee Arthroplasty  Surgeon- Dione Plover. , MD  Assistant- Arlee Muslim, PA-C   Anesthesia-  GA combined with regional for post-op pain  EBL-* No blood loss amount entered *   Drains Hemovac  Tourniquet time-  Total Tourniquet Time Documented: Thigh (Right) - 40 minutes Total: Thigh (Right) - 40 minutes     Complications- None  Condition-PACU - hemodynamically stable.   Brief Clinical Note  Michele Thompson is a 76 y.o. year old female with end stage OA of her right knee with progressively worsening pain and dysfunction. She has constant pain, with activity and at rest and significant functional deficits with difficulties even with ADLs. She has had extensive non-op management including analgesics, injections of cortisone and viscosupplements, and home exercise program, but remains in significant pain with significant dysfunction.Radiographs show bone on bone arthritis medial and patellofemoral. She presents now for right Total Knee Arthroplasty.    Procedure in detail---   The patient is brought into the operating room and positioned supine on the operating table. After successful administration of  GA combined with regional for post-op pain,   a tourniquet is placed high on the  Right thigh(s) and the lower extremity is prepped and draped in the usual sterile fashion. Time out is performed by the operating team and then the  Right lower extremity is wrapped in Esmarch, knee flexed and the tourniquet inflated to 300 mmHg.       A midline incision is made with a ten blade through the subcutaneous tissue to the level of the extensor mechanism. A fresh blade is used to make a medial parapatellar arthrotomy. Soft tissue over the proximal medial tibia is subperiosteally elevated to the joint line with a  knife and into the semimembranosus bursa with a Cobb elevator. Soft tissue over the proximal lateral tibia is elevated with attention being paid to avoiding the patellar tendon on the tibial tubercle. The patella is everted, knee flexed 90 degrees and the ACL and PCL are removed. Findings are bone on bone medial and patellofemoral with large global osteophytes.        The drill is used to create a starting hole in the distal femur and the canal is thoroughly irrigated with sterile saline to remove the fatty contents. The 5 degree Right  valgus alignment guide is placed into the femoral canal and the distal femoral cutting block is pinned to remove 10 mm off the distal femur. Resection is made with an oscillating saw.      The tibia is subluxed forward and the menisci are removed. The extramedullary alignment guide is placed referencing proximally at the medial aspect of the tibial tubercle and distally along the second metatarsal axis and tibial crest. The block is pinned to remove 6mm off the more deficient medial  side. Resection is made with an oscillating saw. Size 2.5is the most appropriate size for the tibia and the proximal tibia is prepared with the modular drill and keel punch for that size.      The femoral sizing guide is placed and size 3 is most appropriate. Rotation is marked off the epicondylar axis and confirmed by creating a rectangular flexion gap at 90 degrees. The size 3 cutting block is pinned in this rotation and the anterior, posterior and chamfer cuts are made with the oscillating saw. The intercondylar block is  then placed and that cut is made.      Trial size 2.5 tibial component, trial size 3 posterior stabilized femur and a 12.5  mm posterior stabilized rotating platform insert trial is placed. Full extension is achieved with excellent varus/valgus and anterior/posterior balance throughout full range of motion. The patella is everted and thickness measured to be 21  mm. Free hand  resection is taken to 12 mm, a 35 template is placed, lug holes are drilled, trial patella is placed, and it tracks normally. Osteophytes are removed off the posterior femur with the trial in place. All trials are removed and the cut bone surfaces prepared with pulsatile lavage. Cement is mixed and once ready for implantation, the size 2.5 tibial implant, size  3 posterior stabilized femoral component, and the size 35 patella are cemented in place and the patella is held with the clamp. The trial insert is placed and the knee held in full extension. The Exparel (20 ml mixed with 60 ml saline) is injected into the extensor mechanism, posterior capsule, medial and lateral gutters and subcutaneous tissues.  All extruded cement is removed and once the cement is hard the permanent 12.5 mm posterior stabilized rotating platform insert is placed into the tibial tray.      The wound is copiously irrigated with saline solution and the extensor mechanism closed over a hemovac drain with #1 V-loc suture. The tourniquet is released for a total tourniquet time of 40  minutes. Flexion against gravity is 130 degrees and the patella tracks normally. Subcutaneous tissue is closed with 2.0 vicryl and subcuticular with running 4.0 Monocryl. The incision is cleaned and dried and steri-strips and a bulky sterile dressing are applied. The limb is placed into a knee immobilizer and the patient is awakened and transported to recovery in stable condition.      Please note that a surgical assistant was a medical necessity for this procedure in order to perform it in a safe and expeditious manner. Surgical assistant was necessary to retract the ligaments and vital neurovascular structures to prevent injury to them and also necessary for proper positioning of the limb to allow for anatomic placement of the prosthesis.   Dione Plover , MD    12/27/2016, 10:56 AM

## 2016-12-27 NOTE — Anesthesia Preprocedure Evaluation (Addendum)
Anesthesia Evaluation  Patient identified by MRN, date of birth, ID band Patient awake    Reviewed: Allergy & Precautions, NPO status , Patient's Chart, lab work & pertinent test results  Airway Mallampati: II  TM Distance: >3 FB     Dental   Pulmonary shortness of breath,    breath sounds clear to auscultation       Cardiovascular hypertension, + Past MI   Rhythm:Regular Rate:Normal     Neuro/Psych  Headaches,    GI/Hepatic negative GI ROS, Neg liver ROS,   Endo/Other  Hypothyroidism   Renal/GU negative Renal ROS     Musculoskeletal   Abdominal   Peds  Hematology   Anesthesia Other Findings   Reproductive/Obstetrics                            Anesthesia Physical Anesthesia Plan  ASA: III  Anesthesia Plan: General   Post-op Pain Management:  Regional for Post-op pain   Induction: Intravenous  PONV Risk Score and Plan: 2 and Ondansetron, Dexamethasone, Midazolam and Treatment may vary due to age or medical condition  Airway Management Planned: Oral ETT  Additional Equipment:   Intra-op Plan:   Post-operative Plan: Possible Post-op intubation/ventilation  Informed Consent: I have reviewed the patients History and Physical, chart, labs and discussed the procedure including the risks, benefits and alternatives for the proposed anesthesia with the patient or authorized representative who has indicated his/her understanding and acceptance.   Dental advisory given  Plan Discussed with: CRNA, Anesthesiologist and Surgeon  Anesthesia Plan Comments:       Anesthesia Quick Evaluation

## 2016-12-27 NOTE — Anesthesia Procedure Notes (Addendum)
Anesthesia Regional Block: Adductor canal block   Pre-Anesthetic Checklist: ,, timeout performed, Correct Patient, Correct Site, Correct Laterality, Correct Procedure, Correct Position, site marked, Risks and benefits discussed,  Surgical consent,  Pre-op evaluation,  At surgeon's request and post-op pain management  Laterality: Right  Prep: chloraprep       Needles:  Injection technique: Single-shot  Needle Type: Stimulator Needle - 80          Additional Needles:   Procedures: Doppler guided, Ultrasound guided,,,,,,  Narrative:  Start time: 12/27/2016 8:45 AM End time: 12/27/2016 9:00 AM Injection made incrementally with aspirations every 5 mL.  Performed by: Personally  Anesthesiologist: Belinda Block

## 2016-12-27 NOTE — Transfer of Care (Signed)
Immediate Anesthesia Transfer of Care Note  Patient: Michele Thompson  Procedure(s) Performed: Procedure(s): RIGHT TOTAL KNEE ARTHROPLASTY (Right)  Patient Location: PACU  Anesthesia Type:General  Level of Consciousness:  sedated, patient cooperative and responds to stimulation  Airway & Oxygen Therapy:Patient Spontanous Breathing and Patient connected to face mask oxgen  Post-op Assessment:  Report given to PACU RN and Post -op Vital signs reviewed and stable  Post vital signs:  Reviewed and stable  Last Vitals:  Vitals:   12/27/16 0900 12/27/16 0901  BP: (!) 127/58   Pulse: 73 63  Resp: 12 14  Temp:    SpO2: 127% 517%    Complications: No apparent anesthesia complications

## 2016-12-27 NOTE — Interval H&P Note (Signed)
History and Physical Interval Note:  12/27/2016 7:06 AM  Michele Thompson  has presented today for surgery, with the diagnosis of Osteoarthritis Right Knee  The various methods of treatment have been discussed with the patient and family. After consideration of risks, benefits and other options for treatment, the patient has consented to  Procedure(s): RIGHT TOTAL KNEE ARTHROPLASTY (Right) as a surgical intervention .  The patient's history has been reviewed, patient examined, no change in status, stable for surgery.  I have reviewed the patient's chart and labs.  Questions were answered to the patient's satisfaction.     [name]

## 2016-12-27 NOTE — Clinical Social Work Note (Signed)
Clinical Social Work Assessment  Patient Details  Name: Michele Thompson MRN: [id number] Date of Birth: 05-13-1940  Date of referral:  12/27/16               Reason for consult:  Discharge Planning                Permission sought to share information with:  Case Manager Permission granted to share information::  Yes, Verbal Permission Granted  Name::        Agency::     Relationship::     Contact Information:     Housing/Transportation Living arrangements for the past 2 months:  [place] of Information:  Adult Children Patient Interpreter Needed:  None Criminal Activity/Legal Involvement Pertinent to Current Situation/Hospitalization:  No - Comment as needed Significant Relationships:  Adult Children Lives with:  Self Do you feel safe going back to the place where you live?  No (SNF  required.) Need for family participation in patient care:  Yes (Comment)  Care giving concerns:  Pt resides alone and will need more assistance than is available at home following hospital dc.   Social Worker assessment / plan: Pt hospitalized from [place] on 12/27/16 for pre planned right total knee arthroplasty. PT eval is pending. Pt sleeping during CSW visit with son at bedside. Son reports that ST Rehab will be needed at dc. CSW explained that PT will evaluate and make recommendations, possibly for ST Rehab. CSW will meet with pt / family following PT eval. Clinicals sent to [facility] ) and [facility], with son's permission, incase SNF is recommended. CSW will continue to follow to assist with dc planning needs.  Employment status:  Retired Nurse, adult PT Recommendations:  Not assessed at this time Information / Referral to community resources:  [place]  Patient/Family's Response to care:  PT recommendations pending. Pt / family requesting ST Rehab.  Patient/Family's Understanding of and  Emotional Response to Diagnosis, Current Treatment, and Prognosis: Son is aware of pt's medical status. Pt / son are planning for ST Rehab at dc. Appears to be some confusion regarding [facility] covering SNF at [facility]. Pt has looked into SNF at [facility]) due to insurance issues. CSW is helping to clarify coverage issues.  Emotional Assessment Appearance:  Appears stated age Attitude/Demeanor/Rapport:  Unable to Assess Affect (typically observed):  Unable to Assess Orientation:  Oriented to Self, Oriented to Place, Oriented to  Time, Oriented to Situation Alcohol / Substance use:  Not Applicable Psych involvement (Current and /or in the community):  No (Comment)  Discharge Needs  Concerns to be addressed:  Discharge Planning Concerns Readmission within the last 30 days:  No Current discharge risk:  None Barriers to Discharge:  No Barriers Identified   Luretha Rued, [place] 12/27/2016, 3:25 PM

## 2016-12-27 NOTE — Anesthesia Procedure Notes (Signed)
Procedure Name: Intubation Date/Time: 12/27/2016 9:43 AM Performed by: Deliah Boston Pre-anesthesia Checklist: Patient identified, Emergency Drugs available, Suction available and Patient being monitored Patient Re-evaluated:Patient Re-evaluated prior to induction Oxygen Delivery Method: Circle system utilized Preoxygenation: Pre-oxygenation with 100% oxygen Induction Type: IV induction Ventilation: Mask ventilation without difficulty Laryngoscope Size: Mac and 3 Grade View: Grade I Tube type: Oral Tube size: 7.0 mm Number of attempts: 1 Airway Equipment and Method: Stylet and Oral airway Placement Confirmation: ETT inserted through vocal cords under direct vision,  positive ETCO2 and breath sounds checked- equal and bilateral Secured at: 22 cm Tube secured with: Tape Dental Injury: Teeth and Oropharynx as per pre-operative assessment

## 2016-12-28 LAB — BASIC METABOLIC PANEL
Anion gap: 9 (ref 5–15)
BUN: 10 mg/dL (ref 6–20)
CO2: 23 mmol/L (ref 22–32)
Calcium: 8.5 mg/dL — ABNORMAL LOW (ref 8.9–10.3)
Chloride: 101 mmol/L (ref 101–111)
Creatinine, Ser: 0.68 mg/dL (ref 0.44–1.00)
GFR calc Af Amer: >60 mL/min (ref >60)
GFR calc non Af Amer: >60 mL/min (ref >60)
Glucose, Bld: 110 mg/dL — ABNORMAL HIGH (ref 65–99)
Potassium: 3.8 mmol/L (ref 3.5–5.1)
Sodium: 133 mmol/L — ABNORMAL LOW (ref 135–145)

## 2016-12-28 LAB — CBC
HCT: 34.5 % — ABNORMAL LOW (ref 36.0–46.0)
Hemoglobin: 11.7 g/dL — ABNORMAL LOW (ref 12.0–15.0)
MCH: 31.1 pg (ref 26.0–34.0)
MCHC: 33.9 g/dL (ref 30.0–36.0)
MCV: 91.8 fL (ref 78.0–100.0)
Platelets: 151 10*3/uL (ref 150–400)
RBC: 3.76 MIL/uL — ABNORMAL LOW (ref 3.87–5.11)
RDW: 12.4 % (ref 11.5–15.5)
WBC: 10.9 10*3/uL — ABNORMAL HIGH (ref 4.0–10.5)

## 2016-12-28 MED ORDER — TRAMADOL HCL 50 MG PO TABS: 50.0000 mg | ORAL_TABLET | Freq: Four times a day (QID) | ORAL | 0 refills | Status: DC | PRN

## 2016-12-28 MED ORDER — METHOCARBAMOL 500 MG PO TABS: 500.0000 mg | ORAL_TABLET | Freq: Four times a day (QID) | ORAL | 0 refills | Status: DC | PRN

## 2016-12-28 MED ORDER — OXYCODONE HCL 5 MG PO TABS: 5.0000 mg | ORAL_TABLET | ORAL | 0 refills | Status: DC | PRN

## 2016-12-28 NOTE — Evaluation (Signed)
Physical Therapy Evaluation Patient Details Name: Michele Thompson MRN: [id number] DOB: 1940-10-22 Today's Date: 12/28/2016   History of Present Illness  s/p R TKA.  H/O MI, PE, tinnitus  Clinical Impression  Pt is s/p TKA resulting in the deficits listed below (see PT Problem List).  Pt will benefit from skilled PT to increase their independence and safety with mobility to allow discharge to the venue listed below.  Pt assisted with ambulating short distance which was limited by fatigue.  Pt also performed exercises in recliner.  Pt would benefit from SNF upon d/c prior to return to [place].     Follow Up Recommendations SNF    Equipment Recommendations  None recommended by PT    Recommendations for Other Services       Precautions / Restrictions Precautions Precautions: Knee;Fall Required Braces or Orthoses: Knee Immobilizer - Right Restrictions Weight Bearing Restrictions: No Other Position/Activity Restrictions: WBAT      Mobility  Bed Mobility Overal bed mobility: Needs Assistance Bed Mobility: Supine to Sit     Supine to sit: Min assist     General bed mobility comments: pt up in recliner on arrival  Transfers Overall transfer level: Needs assistance Equipment used: Rolling walker (2 wheeled) Transfers: Sit to/from Stand Sit to Stand: Min assist Stand pivot transfers: Min assist       General transfer comment: verbal cues for UE and LE positioning, assist to rise, steady, and control descent  Ambulation/Gait Ambulation/Gait assistance: Min assist Ambulation Distance (Feet): 30 Feet Assistive device: Rolling walker (2 wheeled) Gait Pattern/deviations: Step-to pattern;Decreased stance time - right;Antalgic     General Gait Details: verbal cues for sequence, RW positioning, step length, pt reports increased fatigue so recliner brought behind pt  Stairs            Wheelchair Mobility    Modified Rankin (Stroke Patients Only)       Balance                                              Pertinent Vitals/Pain Pain Assessment: 0-10 Pain Score: 5  Faces Pain Scale: Hurts even more Pain Location: R knee Pain Descriptors / Indicators: Aching;Sore Pain Intervention(s): Limited activity within patient's tolerance;Monitored during session;Repositioned;Patient requesting pain meds-RN notified;Ice applied    Home Living Family/patient expects to be discharged to:: Skilled nursing facility                 Additional Comments: from independent living at [facility]    Prior Function Level of Independence: Independent               Hand Dominance        Extremity/Trunk Assessment   Upper Extremity Assessment Upper Extremity Assessment: Overall WFL for tasks assessed    Lower Extremity Assessment Lower Extremity Assessment: RLE deficits/detail RLE Deficits / Details: unable to perform SLR, fair quad contraction, AAROM knee flexion approx 35* limited by pain       Communication   Communication: HOH (wears bil hearing aides)  Cognition Arousal/Alertness: Awake/alert Behavior During Therapy: WFL for tasks assessed/performed Overall Cognitive Status: Within Functional Limits for tasks assessed  General Comments      Exercises Total Joint Exercises Ankle Circles/Pumps: AROM;Both;10 reps Quad Sets: AROM;Both;10 reps Gluteal Sets: AROM;Both;10 reps Short Arc QuadSinclair Ship;Right;10 reps Heel Slides: AAROM;Right;10 reps Hip ABduction/ADduction: AAROM;Right;10 reps   Assessment/Plan    PT Assessment Patient needs continued PT services  PT Problem List Decreased strength;Decreased range of motion;Decreased mobility;Decreased activity tolerance;Decreased knowledge of use of DME;Decreased knowledge of precautions;Pain       PT Treatment Interventions Functional mobility training;Gait training;Therapeutic exercise;DME instruction;Therapeutic  activities;Patient/family education    PT Goals (Current goals can be found in the Care Plan section)  Acute Rehab PT Goals Patient Stated Goal: go to SNF to restore independence prior to home alone PT Goal Formulation: With patient Time For Goal Achievement: 01/01/17 Potential to Achieve Goals: Good    Frequency 7X/week   Barriers to discharge        Co-evaluation               AM-PAC PT "6 Clicks" Daily Activity  Outcome Measure Difficulty turning over in bed (including adjusting bedclothes, sheets and blankets)?: A Lot Difficulty moving from lying on back to sitting on the side of the bed? : Unable Difficulty sitting down on and standing up from a chair with arms (e.g., wheelchair, bedside commode, etc,.)?: Unable Help needed moving to and from a bed to chair (including a wheelchair)?: A Little Help needed walking in hospital room?: A Little Help needed climbing 3-5 steps with a railing? : A Lot 6 Click Score: 12    End of Session Equipment Utilized During Treatment: Gait belt;Right knee immobilizer Activity Tolerance: Patient limited by fatigue Patient left: in chair;with call bell/phone within reach   PT Visit Diagnosis: Difficulty in walking, not elsewhere classified (R26.2)    Time: 2924-4628 PT Time Calculation (min) (ACUTE ONLY): 27 min   Charges:   PT Evaluation $PT Eval Low Complexity: 1 Low PT Treatments $Therapeutic Exercise: 8-22 mins   PT G Codes:        Carmelia Bake, PT, DPT 12/28/2016 Pager: [phone]  York Ram E 12/28/2016, 11:55 AM

## 2016-12-28 NOTE — Progress Notes (Signed)
Physical Therapy Treatment Patient Details Name: Michele Thompson MRN: [id number] DOB: January 13, 1941 Today's Date: 12/28/2016    History of Present Illness s/p R TKA.  H/O MI, PE, tinnitus    PT Comments    Pt ambulated in hallway and able to return to room this afternoon instead of requiring chair due to fatigue.  Continue to recommend SNF.  Follow Up Recommendations  SNF     Equipment Recommendations  None recommended by PT    Recommendations for Other Services       Precautions / Restrictions Precautions Precautions: Knee;Fall Required Braces or Orthoses: Knee Immobilizer - Right Restrictions Other Position/Activity Restrictions: WBAT    Mobility  Bed Mobility Overal bed mobility: Needs Assistance Bed Mobility: Sit to Supine       Sit to supine: Min assist   General bed mobility comments: assist for R LE onto bed  Transfers Overall transfer level: Needs assistance Equipment used: Rolling walker (2 wheeled) Transfers: Sit to/from Stand Sit to Stand: Min guard         General transfer comment: verbal cues for UE and LE positioning  Ambulation/Gait Ambulation/Gait assistance: Min guard Ambulation Distance (Feet): 50 Feet Assistive device: Rolling walker (2 wheeled) Gait Pattern/deviations: Step-to pattern;Decreased stance time - right;Antalgic     General Gait Details: verbal cues for sequence, RW positioning, step length   Stairs            Wheelchair Mobility    Modified Rankin (Stroke Patients Only)       Balance                                            Cognition Arousal/Alertness: Awake/alert Behavior During Therapy: WFL for tasks assessed/performed Overall Cognitive Status: Within Functional Limits for tasks assessed                                        Exercises     General Comments        Pertinent Vitals/Pain Pain Assessment: 0-10 Pain Score: 5  Pain Location: R knee Pain Descriptors  / Indicators: Aching;Sore Pain Intervention(s): Limited activity within patient's tolerance;Monitored during session;Repositioned    Home Living Family/patient expects to be discharged to:: Skilled nursing facility               Additional Comments: from independent living at [facility]    Prior Function Level of Independence: Independent          PT Goals (current goals can now be found in the care plan section) Acute Rehab PT Goals PT Goal Formulation: With patient Time For Goal Achievement: 01/01/17 Potential to Achieve Goals: Good Progress towards PT goals: Progressing toward goals    Frequency    7X/week      PT Plan Current plan remains appropriate    Co-evaluation              AM-PAC PT "6 Clicks" Daily Activity  Outcome Measure  Difficulty turning over in bed (including adjusting bedclothes, sheets and blankets)?: A Lot Difficulty moving from lying on back to sitting on the side of the bed? : Unable Difficulty sitting down on and standing up from a chair with arms (e.g., wheelchair, bedside commode, etc,.)?: Unable Help needed moving to and from a bed  to chair (including a wheelchair)?: A Little Help needed walking in hospital room?: A Little Help needed climbing 3-5 steps with a railing? : A Lot 6 Click Score: 12    End of Session Equipment Utilized During Treatment: Gait belt;Right knee immobilizer Activity Tolerance: Patient tolerated treatment well Patient left: with call bell/phone within reach;in bed   PT Visit Diagnosis: Difficulty in walking, not elsewhere classified (R26.2)     Time: 9688-6484 PT Time Calculation (min) (ACUTE ONLY): 13 min  Charges:  $Gait Training: 8-22 mins                    G Codes:       Carmelia Bake, PT, DPT 12/28/2016 Pager: [phone]  York Ram E 12/28/2016, 3:25 PM

## 2016-12-28 NOTE — Progress Notes (Signed)
CSW met with PA this am to confirm plan for SNF. CSW met with pt / son at bedside this am to assist with dc planning. Pt has chosen [facility] ( [facility] ) for ST Rehab following hospital dc. SNF expects to have availability 9/12 if stable for dc. CSW will continue to follow to assist with dc planning to SNF.  Werner Lean LCSW [phone]

## 2016-12-28 NOTE — Discharge Instructions (Signed)
° °Dr. Frank Aluisio °Total Joint Specialist °[facility] °[address] °[place], [place] [address] °[phone] ° °TOTAL KNEE REPLACEMENT POSTOPERATIVE DIRECTIONS ° °Knee Rehabilitation, Guidelines Following Surgery  °Results after knee surgery are often greatly improved when you follow the exercise, range of motion and muscle strengthening exercises prescribed by your doctor. Safety measures are also important to protect the knee from further injury. Any time any of these exercises cause you to have increased pain or swelling in your knee joint, decrease the amount until you are comfortable again and slowly increase them. If you have problems or questions, call your caregiver or physical therapist for advice.  ° °HOME CARE INSTRUCTIONS  °Remove items at home which could result in a fall. This includes throw rugs or furniture in walking pathways.  °· ICE to the affected knee every three hours for 30 minutes at a time and then as needed for pain and swelling.  Continue to use ice on the knee for pain and swelling from surgery. You may notice swelling that will progress down to the foot and ankle.  This is normal after surgery.  Elevate the leg when you are not up walking on it.   °· Continue to use the breathing machine which will help keep your temperature down.  It is common for your temperature to cycle up and down following surgery, especially at night when you are not up moving around and exerting yourself.  The breathing machine keeps your lungs expanded and your temperature down. °· Do not place pillow under knee, focus on keeping the knee straight while resting ° °DIET °You may resume your previous home diet once your are discharged from the hospital. ° °DRESSING / WOUND CARE / SHOWERING °You may shower 3 days after surgery, but keep the wounds dry during showering.  You may use an occlusive plastic wrap (Press'n Seal for example), NO SOAKING/SUBMERGING IN THE BATHTUB.  If the  bandage gets wet, change with a clean dry gauze.  If the incision gets wet, pat the wound dry with a clean towel. °You may start showering once you are discharged home but do not submerge the incision under water. Just pat the incision dry and apply a dry gauze dressing on daily. °Change the surgical dressing daily and reapply a dry dressing each time. ° °ACTIVITY °Walk with your walker as instructed. °Use walker as long as suggested by your caregivers. °Avoid periods of inactivity such as sitting longer than an hour when not asleep. This helps prevent blood clots.  °You may resume a sexual relationship in one month or when given the OK by your doctor.  °You may return to work once you are cleared by your doctor.  °Do not drive a car for 6 weeks or until released by you surgeon.  °Do not drive while taking narcotics. ° °WEIGHT BEARING °Weight bearing as tolerated with assist device (walker, cane, etc) as directed, use it as long as suggested by your surgeon or therapist, typically at least 4-6 weeks. ° °POSTOPERATIVE CONSTIPATION PROTOCOL °Constipation - defined medically as fewer than three stools per week and severe constipation as less than one stool per week. ° °One of the most common issues patients have following surgery is constipation.  Even if you have a regular bowel pattern at home, your normal regimen is likely to be disrupted due to multiple reasons following surgery.  Combination of anesthesia, postoperative narcotics, change in appetite and fluid intake all can affect your bowels.    In order to avoid complications following surgery, here are some recommendations in order to help you during your recovery period. ° °Colace (docusate) - Pick up an over-the-counter form of Colace or another stool softener and take twice a day as long as you are requiring postoperative pain medications.  Take with a full glass of water daily.  If you experience loose stools or diarrhea, hold the colace until you stool forms  back up.  If your symptoms do not get better within 1 week or if they get worse, check with your doctor. ° °Dulcolax (bisacodyl) - Pick up over-the-counter and take as directed by the product packaging as needed to assist with the movement of your bowels.  Take with a full glass of water.  Use this product as needed if not relieved by Colace only.  ° °MiraLax (polyethylene glycol) - Pick up over-the-counter to have on hand.  MiraLax is a solution that will increase the amount of water in your bowels to assist with bowel movements.  Take as directed and can mix with a glass of water, juice, soda, coffee, or tea.  Take if you go more than two days without a movement. °Do not use MiraLax more than once per day. Call your doctor if you are still constipated or irregular after using this medication for 7 days in a row. ° °If you continue to have problems with postoperative constipation, please contact the office for further assistance and recommendations.  If you experience "the worst abdominal pain ever" or develop nausea or vomiting, please contact the office immediatly for further recommendations for treatment. ° °ITCHING ° If you experience itching with your medications, try taking only a single pain pill, or even half a pain pill at a time.  You can also use Benadryl over the counter for itching or also to help with sleep.  ° °TED HOSE STOCKINGS °Wear the elastic stockings on both legs for three weeks following surgery during the day but you may remove then at night for sleeping. ° °MEDICATIONS °See your medication summary on the “After Visit Summary” that the nursing staff will review with you prior to discharge.  You may have some home medications which will be placed on hold until you complete the course of blood thinner medication.  It is important for you to complete the blood thinner medication as prescribed by your surgeon.  Continue your approved medications as instructed at time of  discharge. ° °PRECAUTIONS °If you experience chest pain or shortness of breath - call 911 immediately for transfer to the hospital emergency department.  °If you develop a fever greater that 101 F, purulent drainage from wound, increased redness or drainage from wound, foul odor from the wound/dressing, or calf pain - CONTACT YOUR SURGEON.   °                                                °FOLLOW-UP APPOINTMENTS °Make sure you keep all of your appointments after your operation with your surgeon and caregivers. You should call the office at the above phone number and make an appointment for approximately two weeks after the date of your surgery or on the date instructed by your surgeon outlined in the "After Visit Summary". ° ° °RANGE OF MOTION AND STRENGTHENING EXERCISES  °Rehabilitation of the knee is important following a knee injury or   an operation. After just a few days of immobilization, the muscles of the thigh which control the knee become weakened and shrink (atrophy). Knee exercises are designed to build up the tone and strength of the thigh muscles and to improve knee motion. Often times heat used for twenty to thirty minutes before working out will loosen up your tissues and help with improving the range of motion but do not use heat for the first two weeks following surgery. These exercises can be done on a training (exercise) mat, on the floor, on a table or on a bed. Use what ever works the best and is most comfortable for you Knee exercises include:  Leg Lifts - While your knee is still immobilized in a splint or cast, you can do straight leg raises. Lift the leg to 60 degrees, hold for 3 sec, and slowly lower the leg. Repeat 10-20 times 2-3 times daily. Perform this exercise against resistance later as your knee gets better.  Quad and Hamstring Sets - Tighten up the muscle on the front of the thigh (Quad) and hold for 5-10 sec. Repeat this 10-20 times hourly. Hamstring sets are done by pushing the  foot backward against an object and holding for 5-10 sec. Repeat as with quad sets.   Leg Slides: Lying on your back, slowly slide your foot toward your buttocks, bending your knee up off the floor (only go as far as is comfortable). Then slowly slide your foot back down until your leg is flat on the floor again.  Angel Wings: Lying on your back spread your legs to the side as far apart as you can without causing discomfort.  A rehabilitation program following serious knee injuries can speed recovery and prevent re-injury in the future due to weakened muscles. Contact your doctor or a physical therapist for more information on knee rehabilitation.   IF YOU ARE TRANSFERRED TO A SKILLED REHAB FACILITY If the patient is transferred to a skilled rehab facility following release from the hospital, a list of the current medications will be sent to the facility for the patient to continue.  When discharged from the skilled rehab facility, please have the facility set up the patient's [place] prior to being released. Also, the skilled facility will be responsible for providing the patient with their medications at time of release from the facility to include their pain medication, the muscle relaxants, and their blood thinner medication. If the patient is still at the rehab facility at time of the two week follow up appointment, the skilled rehab facility will also need to assist the patient in arranging follow up appointment in our office and any transportation needs.  MAKE SURE YOU:  Understand these instructions.  Get help right away if you are not doing well or get worse.    Pick up stool softner and laxative for home use following surgery while on pain medications. Do not submerge incision under water. Please use good hand washing techniques while changing dressing each day. May shower starting three days after surgery. Please use a clean towel to pat the incision dry following  showers. Continue to use ice for pain and swelling after surgery. Do not use any lotions or creams on the incision until instructed by your surgeon.  Resume the Xarelto 20 mg dosing at home following discharge.

## 2016-12-28 NOTE — Discharge Summary (Signed)
Physician Discharge Summary   Patient ID: Michele Thompson MRN: [id number] DOB/AGE: [birth year] 76 y.o.  Admit date: 12/27/2016 Discharge date: 12-29-2016  Primary Diagnosis:  Osteoarthritis  Right knee(s)  Admission Diagnoses:  Past Medical History:  Diagnosis Date  . Coronary artery disease 03/16/2016   a. admx with angina and mildly elevated Troponin >> LHC: mLAD 30 >> CCB started for poss microvascular angina  . Hyperlipidemia 03/16/2016  . Hypertension   . PE (pulmonary thromboembolism) ([place]) 03/17/2016   Bilateral submassive pulmonary embolus with RUE and RLE DVT on venous duplex 02/2016 c/b PEA arrest // Echo 03/21/16: mild LVH, EF 60-65, no RWMA, Gr 1 DD, normal RVSF  . Sinus bradycardia 03/31/2016  . Thrombocytopenia ([place]) 02/2016   Profound thrombocytopenia noted upon admission for pulmonary embolism >> initially thought to be from Hep induced thrombocytopenia given recent admit for cardiac cath and Heparin use // However, HIT Ab was neg (0.184) // Hematology consult pending  . Thyroid disease    Discharge Diagnoses:   Active Problems:   OA (osteoarthritis) of knee  Estimated body mass index is 32.74 kg/m as calculated from the following:   Height as of this encounter: 5' 2" (1.575 m).   Weight as of this encounter: 81.2 kg (179 lb).  Procedure:  Procedure(s) (LRB): RIGHT TOTAL KNEE ARTHROPLASTY (Right)   Consults: None  HPI: Michele Thompson is a 76 y.o. year old female with end stage OA of her right knee with progressively worsening pain and dysfunction. She has constant pain, with activity and at rest and significant functional deficits with difficulties even with ADLs. She has had extensive non-op management including analgesics, injections of cortisone and viscosupplements, and home exercise program, but remains in significant pain with significant dysfunction.Radiographs show bone on bone arthritis medial and patellofemoral. She presents now for right Total Knee  Arthroplasty.    Laboratory Data: Admission on 12/27/2016  Component Date Value Ref Range Status  . ABO/RH(D) 12/27/2016 A POS   Final  . Antibody Screen 12/27/2016 NEG   Final  . Sample Expiration 12/27/2016 12/30/2016   Final  . ABO/RH(D) 12/27/2016 A POS   Final  . WBC 12/28/2016 10.9* 4.0 - 10.5 K/uL Final  . RBC 12/28/2016 3.76* 3.87 - 5.11 MIL/uL Final  . Hemoglobin 12/28/2016 11.7* 12.0 - 15.0 g/dL Final  . HCT 12/28/2016 34.5* 36.0 - 46.0 % Final  . MCV 12/28/2016 91.8  78.0 - 100.0 fL Final  . MCH 12/28/2016 31.1  26.0 - 34.0 pg Final  . MCHC 12/28/2016 33.9  30.0 - 36.0 g/dL Final  . RDW 12/28/2016 12.4  11.5 - 15.5 % Final  . Platelets 12/28/2016 151  150 - 400 K/uL Final  . Sodium 12/28/2016 133* 135 - 145 mmol/L Final  . Potassium 12/28/2016 3.8  3.5 - 5.1 mmol/L Final  . Chloride 12/28/2016 101  101 - 111 mmol/L Final  . CO2 12/28/2016 23  22 - 32 mmol/L Final  . Glucose, Bld 12/28/2016 110* 65 - 99 mg/dL Final  . BUN 12/28/2016 10  6 - 20 mg/dL Final  . Creatinine, Ser 12/28/2016 0.68  0.44 - 1.00 mg/dL Final  . Calcium 12/28/2016 8.5* 8.9 - 10.3 mg/dL Final  . GFR calc non Af Amer 12/28/2016 >60  >60 mL/min Final  . GFR calc Af Amer 12/28/2016 >60  >60 mL/min Final   Comment: (NOTE) The eGFR has been calculated using the CKD EPI equation. This calculation has not been validated in all clinical situations.  eGFR's persistently <60 mL/min signify possible Chronic Kidney Disease.   Georgiann Hahn gap 12/28/2016 9  5 - 15 Final  Hospital Outpatient Visit on 12/15/2016  Component Date Value Ref Range Status  . aPTT 12/15/2016 29  24 - 36 seconds Final  . WBC 12/15/2016 5.1  4.0 - 10.5 K/uL Final  . RBC 12/15/2016 4.19  3.87 - 5.11 MIL/uL Final  . Hemoglobin 12/15/2016 12.8  12.0 - 15.0 g/dL Final  . HCT 12/15/2016 38.1  36.0 - 46.0 % Final  . MCV 12/15/2016 90.9  78.0 - 100.0 fL Final  . MCH 12/15/2016 30.5  26.0 - 34.0 pg Final  . MCHC 12/15/2016 33.6  30.0 - 36.0 g/dL  Final  . RDW 12/15/2016 12.3  11.5 - 15.5 % Final  . Platelets 12/15/2016 185  150 - 400 K/uL Final  . Sodium 12/15/2016 137  135 - 145 mmol/L Final  . Potassium 12/15/2016 3.6  3.5 - 5.1 mmol/L Final  . Chloride 12/15/2016 103  101 - 111 mmol/L Final  . CO2 12/15/2016 27  22 - 32 mmol/L Final  . Glucose, Bld 12/15/2016 98  65 - 99 mg/dL Final  . BUN 12/15/2016 14  6 - 20 mg/dL Final  . Creatinine, Ser 12/15/2016 0.78  0.44 - 1.00 mg/dL Final  . Calcium 12/15/2016 9.3  8.9 - 10.3 mg/dL Final  . Total Protein 12/15/2016 7.0  6.5 - 8.1 g/dL Final  . Albumin 12/15/2016 4.1  3.5 - 5.0 g/dL Final  . AST 12/15/2016 31  15 - 41 U/L Final  . ALT 12/15/2016 16  14 - 54 U/L Final  . Alkaline Phosphatase 12/15/2016 76  38 - 126 U/L Final  . Total Bilirubin 12/15/2016 0.6  0.3 - 1.2 mg/dL Final  . GFR calc non Af Amer 12/15/2016 >60  >60 mL/min Final  . GFR calc Af Amer 12/15/2016 >60  >60 mL/min Final   Comment: (NOTE) The eGFR has been calculated using the CKD EPI equation. This calculation has not been validated in all clinical situations. eGFR's persistently <60 mL/min signify possible Chronic Kidney Disease.   . Anion gap 12/15/2016 7  5 - 15 Final  . Prothrombin Time 12/15/2016 14.1  11.4 - 15.2 seconds Final  . INR 12/15/2016 1.09   Final  . MRSA, PCR 12/15/2016 NEGATIVE  NEGATIVE Final  . Staphylococcus aureus 12/15/2016 NEGATIVE  NEGATIVE Final   Comment: (NOTE) The Xpert SA Assay (FDA approved for NASAL specimens in patients 16 years of age and older), is one component of a comprehensive surveillance program. It is not intended to diagnose infection nor to guide or monitor treatment.      X-Rays:No results found.  EKG: Orders placed or performed in visit on 10/26/16  . EKG 12-Lead     Hospital Course: Michele Thompson is a 76 y.o. who was admitted to [name]. They were brought to the operating room on 12/27/2016 and underwent Procedure(s): RIGHT TOTAL KNEE  ARTHROPLASTY.  Patient tolerated the procedure well and was later transferred to the recovery room and then to the orthopaedic floor for postoperative care.  They were given PO and IV analgesics for pain control following their surgery.  They were given 24 hours of postoperative antibiotics of  Anti-infectives    Start     Dose/Rate Route Frequency Ordered Stop   12/27/16 1530  ceFAZolin (ANCEF) IVPB 2g/100 mL premix     2 g 200 mL/hr over 30 Minutes Intravenous Every 6  hours 12/27/16 1301 12/27/16 2059   12/27/16 0700  ceFAZolin (ANCEF) 2-4 GM/100ML-% IVPB    Comments:  Algis Liming   : cabinet override      12/27/16 0700 12/27/16 0950   12/27/16 0657  ceFAZolin (ANCEF) IVPB 2g/100 mL premix     2 g 200 mL/hr over 30 Minutes Intravenous On call to O.R. 12/27/16 [id number] 12/27/16 0950     and started on DVT prophylaxis in the form of Xarelto.   PT and OT were ordered for total joint protocol.  Discharge planning consulted to help with postop disposition and equipment needs.  Patient had a tough night on the evening of surgery.  They started to get up OOB with therapy on day one. Hemovac drain was pulled without difficulty.  Continued to work with therapy into day two.  Dressing was changed on day two and the incision was healing well. Patient was seen in rounds and was ready to go to the SNF of choice.  Discharge to SNF Diet - Cardiac diet Follow up - in 2 weeks Activity - WBAT Disposition - Skilled nursing facility Condition Upon Discharge - Stable D/C Meds - See DC Summary DVT Prophylaxis - Xarelto   Discharge Instructions    Call MD / Call 911    Complete by:  As directed    If you experience chest pain or shortness of breath, CALL 911 and be transported to the hospital emergency room.  If you develope a fever above 101 F, pus (white drainage) or increased drainage or redness at the wound, or calf pain, call your surgeon's office.   Change dressing    Complete by:  As directed     Change dressing daily with sterile 4 x 4 inch gauze dressing and apply TED hose. Do not submerge the incision under water.   Constipation Prevention    Complete by:  As directed    Drink plenty of fluids.  Prune juice may be helpful.  You may use a stool softener, such as Colace (over the counter) 100 mg twice a day.  Use MiraLax (over the counter) for constipation as needed.   Diet - low sodium heart healthy    Complete by:  As directed    Discharge instructions    Complete by:  As directed    Resume the Xarelto 20 mg dosing at home following discharge.  Pick up stool softner and laxative for home use following surgery while on pain medications. Do not submerge incision under water. Please use good hand washing techniques while changing dressing each day. May shower starting three days after surgery. Please use a clean towel to pat the incision dry following showers. Continue to use ice for pain and swelling after surgery. Do not use any lotions or creams on the incision until instructed by your surgeon.  Wear both TED hose on both legs during the day every day for three weeks, but may remove the TED hose at night at home.  Postoperative Constipation Protocol  Constipation - defined medically as fewer than three stools per week and severe constipation as less than one stool per week.  One of the most common issues patients have following surgery is constipation.  Even if you have a regular bowel pattern at home, your normal regimen is likely to be disrupted due to multiple reasons following surgery.  Combination of anesthesia, postoperative narcotics, change in appetite and fluid intake all can affect your bowels.  In order to avoid complications following  surgery, here are some recommendations in order to help you during your recovery period.  Colace (docusate) - Pick up an over-the-counter form of Colace or another stool softener and take twice a day as long as you are requiring  postoperative pain medications.  Take with a full glass of water daily.  If you experience loose stools or diarrhea, hold the colace until you stool forms back up.  If your symptoms do not get better within 1 week or if they get worse, check with your doctor.  Dulcolax (bisacodyl) - Pick up over-the-counter and take as directed by the product packaging as needed to assist with the movement of your bowels.  Take with a full glass of water.  Use this product as needed if not relieved by Colace only.   MiraLax (polyethylene glycol) - Pick up over-the-counter to have on hand.  MiraLax is a solution that will increase the amount of water in your bowels to assist with bowel movements.  Take as directed and can mix with a glass of water, juice, soda, coffee, or tea.  Take if you go more than two days without a movement. Do not use MiraLax more than once per day. Call your doctor if you are still constipated or irregular after using this medication for 7 days in a row.  If you continue to have problems with postoperative constipation, please contact the office for further assistance and recommendations.  If you experience "the worst abdominal pain ever" or develop nausea or vomiting, please contact the office immediatly for further recommendations for treatment.   Do not put a pillow under the knee. Place it under the heel.    Complete by:  As directed    Do not sit on low chairs, stoools or toilet seats, as it may be difficult to get up from low surfaces    Complete by:  As directed    Driving restrictions    Complete by:  As directed    No driving until released by the physician.   Increase activity slowly as tolerated    Complete by:  As directed    Lifting restrictions    Complete by:  As directed    No lifting until released by the physician.   Patient may shower    Complete by:  As directed    You may shower without a dressing once there is no drainage.  Do not wash over the wound.  If drainage  remains, do not shower until drainage stops.   TED hose    Complete by:  As directed    Use stockings (TED hose) for 3 weeks on both leg(s).  You may remove them at night for sleeping.   Weight bearing as tolerated    Complete by:  As directed    Laterality:  right   Extremity:  Lower     Allergies as of 12/28/2016   No Known Allergies     Medication List    STOP taking these medications   Vitamin D 2000 units Caps     TAKE these medications   acetaminophen 650 MG CR tablet Commonly known as:  TYLENOL Take 1,300 mg by mouth at bedtime.   atorvastatin 40 MG tablet Commonly known as:  LIPITOR Take 1 tablet (40 mg total) by mouth daily at 6 PM.   hydrochlorothiazide 12.5 MG capsule Commonly known as:  MICROZIDE Take 12.5 mg by mouth daily.   levothyroxine 100 MCG tablet Commonly known as:  SYNTHROID, LEVOTHROID  Take 100 mcg by mouth daily before breakfast.   lisinopril 40 MG tablet Commonly known as:  PRINIVIL,ZESTRIL Take 1 tablet (40 mg total) by mouth daily.   methocarbamol 500 MG tablet Commonly known as:  ROBAXIN Take 1 tablet (500 mg total) by mouth every 6 (six) hours as needed for muscle spasms.   oxyCODONE 5 MG immediate release tablet Commonly known as:  Oxy IR/ROXICODONE Take 1-2 tablets (5-10 mg total) by mouth every 4 (four) hours as needed for moderate pain or severe pain.   polyethylene glycol packet Commonly known as:  MIRALAX / GLYCOLAX Take 17 g by mouth daily.   rivaroxaban 20 MG Tabs tablet Commonly known as:  XARELTO Take 1 tablet (20 mg total) by mouth daily with supper.   sennosides-docusate sodium 8.6-50 MG tablet Commonly known as:  SENOKOT-S Take 2 tablets by mouth at bedtime.   traMADol 50 MG tablet Commonly known as:  ULTRAM Take 1-2 tablets (50-100 mg total) by mouth every 6 (six) hours as needed for moderate pain.            Discharge Care Instructions        Start     Ordered   12/28/16 0000  methocarbamol (ROBAXIN)  500 MG tablet  Every 6 hours PRN    Question:  Supervising Provider  Answer:  Gaynelle Arabian   12/28/16 2121   12/28/16 0000  oxyCODONE (OXY IR/ROXICODONE) 5 MG immediate release tablet  Every 4 hours PRN    Question:  Supervising Provider  Answer:  Gaynelle Arabian   12/28/16 2121   12/28/16 0000  traMADol (ULTRAM) 50 MG tablet  Every 6 hours PRN    Question:  Supervising Provider  Answer:  Gaynelle Arabian   12/28/16 2121   12/28/16 0000  Call MD / Call 911    Comments:  If you experience chest pain or shortness of breath, CALL 911 and be transported to the hospital emergency room.  If you develope a fever above 101 F, pus (white drainage) or increased drainage or redness at the wound, or calf pain, call your surgeon's office.   12/28/16 2121   12/28/16 0000  Discharge instructions    Comments:  Resume the Xarelto 20 mg dosing at home following discharge.  Pick up stool softner and laxative for home use following surgery while on pain medications. Do not submerge incision under water. Please use good hand washing techniques while changing dressing each day. May shower starting three days after surgery. Please use a clean towel to pat the incision dry following showers. Continue to use ice for pain and swelling after surgery. Do not use any lotions or creams on the incision until instructed by your surgeon.  Wear both TED hose on both legs during the day every day for three weeks, but may remove the TED hose at night at home.  Postoperative Constipation Protocol  Constipation - defined medically as fewer than three stools per week and severe constipation as less than one stool per week.  One of the most common issues patients have following surgery is constipation.  Even if you have a regular bowel pattern at home, your normal regimen is likely to be disrupted due to multiple reasons following surgery.  Combination of anesthesia, postoperative narcotics, change in appetite and fluid intake  all can affect your bowels.  In order to avoid complications following surgery, here are some recommendations in order to help you during your recovery period.  Colace (docusate) - Pick  up an over-the-counter form of Colace or another stool softener and take twice a day as long as you are requiring postoperative pain medications.  Take with a full glass of water daily.  If you experience loose stools or diarrhea, hold the colace until you stool forms back up.  If your symptoms do not get better within 1 week or if they get worse, check with your doctor.  Dulcolax (bisacodyl) - Pick up over-the-counter and take as directed by the product packaging as needed to assist with the movement of your bowels.  Take with a full glass of water.  Use this product as needed if not relieved by Colace only.   MiraLax (polyethylene glycol) - Pick up over-the-counter to have on hand.  MiraLax is a solution that will increase the amount of water in your bowels to assist with bowel movements.  Take as directed and can mix with a glass of water, juice, soda, coffee, or tea.  Take if you go more than two days without a movement. Do not use MiraLax more than once per day. Call your doctor if you are still constipated or irregular after using this medication for 7 days in a row.  If you continue to have problems with postoperative constipation, please contact the office for further assistance and recommendations.  If you experience "the worst abdominal pain ever" or develop nausea or vomiting, please contact the office immediatly for further recommendations for treatment.   12/28/16 2121   12/28/16 0000  Diet - low sodium heart healthy     12/28/16 2121   12/28/16 0000  Constipation Prevention    Comments:  Drink plenty of fluids.  Prune juice may be helpful.  You may use a stool softener, such as Colace (over the counter) 100 mg twice a day.  Use MiraLax (over the counter) for constipation as needed.   12/28/16 2121    12/28/16 0000  Increase activity slowly as tolerated     12/28/16 2121   12/28/16 0000  Patient may shower    Comments:  You may shower without a dressing once there is no drainage.  Do not wash over the wound.  If drainage remains, do not shower until drainage stops.   12/28/16 2121   12/28/16 0000  Weight bearing as tolerated    Question Answer Comment  Laterality right   Extremity Lower      12/28/16 2121   12/28/16 0000  Driving restrictions    Comments:  No driving until released by the physician.   12/28/16 2121   12/28/16 0000  Lifting restrictions    Comments:  No lifting until released by the physician.   12/28/16 2121   12/28/16 0000  TED hose    Comments:  Use stockings (TED hose) for 3 weeks on both leg(s).  You may remove them at night for sleeping.   12/28/16 2121   12/28/16 0000  Change dressing    Comments:  Change dressing daily with sterile 4 x 4 inch gauze dressing and apply TED hose. Do not submerge the incision under water.   12/28/16 2121   12/28/16 0000  Do not put a pillow under the knee. Place it under the heel.     12/28/16 2121   12/28/16 0000  Do not sit on low chairs, stoools or toilet seats, as it may be difficult to get up from low surfaces     12/28/16 2121      Contact information for follow-up providers  Gaynelle Arabian, MD. Schedule an appointment as soon as possible for a visit on 01/11/2017.   Specialty:  Orthopedic Surgery Contact information: [address] [place] [address] [phone]            Contact information for after-discharge care    Destination    [facility] Follow up.   Specialty:  Skilled Nursing Facility Contact information: [place] [phone]                  Signed: Arlee Muslim, PA-C Orthopaedic Surgery 12/28/2016, 9:22 PM

## 2016-12-28 NOTE — Progress Notes (Signed)
Plan for d/c to SNF, discharge planning per CSW. [phone] 

## 2016-12-28 NOTE — Evaluation (Signed)
Occupational Therapy Evaluation Patient Details Name: Michele Thompson MRN: [id number] DOB: Jan 26, 1941 Today's Date: 12/28/2016    History of Present Illness s/p R TKA.  H/O MI, PE, tinnitus   Clinical Impression   This 76 year old female was admitted for the above surgery. She lives alone at [facility] independent living at baseline. She will benefit from continued OT in acute as well as snf to restore independence with goal of returning to her home. Goals in acute are for min A overall. Pt needs min A for SPT to 3:1 commode and up to max A for LB adls at this time    Follow Up Recommendations  SNF    Equipment Recommendations  3 in 1 bedside commode    Recommendations for Other Services       Precautions / Restrictions Precautions Precautions: Knee;Fall Required Braces or Orthoses: Knee Immobilizer - Right Restrictions Weight Bearing Restrictions: No Other Position/Activity Restrictions: WBAT      Mobility Bed Mobility Overal bed mobility: Needs Assistance Bed Mobility: Supine to Sit     Supine to sit: Min assist     General bed mobility comments: for RLE.  Extra time and assist to scoot to EOB  Transfers Overall transfer level: Needs assistance Equipment used: Rolling walker (2 wheeled) Transfers: Sit to/from [facility] Sit to Stand: Min assist Stand pivot transfers: Min assist       General transfer comment: assist to rise and steady. Cues for UE/LE placement    Balance                                           ADL either performed or assessed with clinical judgement   ADL Overall ADL's : Needs assistance/impaired     Grooming: Wash/dry hands;Set up;Sitting   Upper Body Bathing: Set up;Sitting   Lower Body Bathing: Moderate assistance;Sit to/from stand   Upper Body Dressing : Set up;Sitting   Lower Body Dressing: Maximal assistance;Sit to/from stand   Toilet Transfer: Minimal assistance;Stand-pivot;BSC;RW    Toileting- Clothing Manipulation and Hygiene: Moderate assistance;Sit to/from stand         General ADL Comments: performed SPT to 3:1 commode then took several steps to recliner with cues for sequencing.  Pt initially anxious with movement.  Educated on use of reacher for ADLs; pt is unable to lift RLE against gravity at this time. Used KI     Museum/gallery curator      Pertinent Vitals/Pain Pain Assessment: Faces Faces Pain Scale: Hurts even more Pain Location: R knee Pain Descriptors / Indicators: Aching Pain Intervention(s): Limited activity within patient's tolerance;Monitored during session;Premedicated before session;Repositioned;Ice applied     Hand Dominance     Extremity/Trunk Assessment Upper Extremity Assessment Upper Extremity Assessment: Overall WFL for tasks assessed           Communication Communication Communication:  (wears bil hearing aides)   Cognition Arousal/Alertness: Awake/alert Behavior During Therapy: WFL for tasks assessed/performed Overall Cognitive Status: Within Functional Limits for tasks assessed                                     General Comments       Exercises     Shoulder Instructions  Home Living Family/patient expects to be discharged to:: Skilled nursing facility                                 Additional Comments: from independent living at [facility]      Prior Functioning/Environment Level of Independence: Independent                 OT Problem List: Decreased strength;Decreased activity tolerance;Decreased knowledge of use of DME or AE;Decreased knowledge of precautions;Pain      OT Treatment/Interventions: Self-care/ADL training;DME and/or AE instruction;Patient/family education;Therapeutic activities    OT Goals(Current goals can be found in the care plan section) Acute Rehab OT Goals Patient Stated Goal: go to SNF to restore independence prior to  home alone OT Goal Formulation: With patient Time For Goal Achievement: 01/04/17 Potential to Achieve Goals: Good ADL Goals Pt Will Perform Grooming: with supervision;standing Pt Will Perform Lower Body Bathing: with min assist;with adaptive equipment;sit to/from stand Pt Will Perform Lower Body Dressing: with min assist;with adaptive equipment;sit to/from stand Education officer, environmental) Pt Will Transfer to Toilet: with supervision;ambulating;bedside commode Pt Will Perform Toileting - Clothing Manipulation and hygiene: with supervision;sit to/from stand  OT Frequency: Min 2X/week   Barriers to D/C:            Co-evaluation              AM-PAC PT "6 Clicks" Daily Activity     Outcome Measure Help from another person eating meals?: None Help from another person taking care of personal grooming?: A Little Help from another person toileting, which includes using toliet, bedpan, or urinal?: A Lot Help from another person bathing (including washing, rinsing, drying)?: A Lot Help from another person to put on and taking off regular upper body clothing?: A Little Help from another person to put on and taking off regular lower body clothing?: A Lot 6 Click Score: 16   End of Session    Activity Tolerance: Patient tolerated treatment well Patient left: in chair;with call bell/phone within reach  OT Visit Diagnosis: Pain Pain - Right/Left: Right Pain - part of body: Knee                Time: 6503-5465 OT Time Calculation (min): 32 min Charges:  OT General Charges $OT Visit: 1 Visit OT Evaluation $OT Eval Low Complexity: 1 Low OT Treatments $Self Care/Home Management : 8-22 mins G-Codes:     [place], OTR/L [phone] 12/28/2016  , 12/28/2016, 10:08 AM

## 2016-12-28 NOTE — Progress Notes (Signed)
   Subjective: 1 Day Post-Op Procedure(s) (LRB): RIGHT TOTAL KNEE ARTHROPLASTY (Right) Patient reports pain as moderate.   Patient seen in rounds with Dr. Wynelle Link. Patient is well, but has had some minor complaints of pain in the knee, requiring pain medications We will start therapy today.  Plan is to go Home after hospital stay.  Objective: Vital signs in last 24 hours: Temp:  [97.3 F (36.3 C)-98.1 F (36.7 C)] 97.9 F (36.6 C) (09/11 0514) Pulse Rate:  [51-111] 54 (09/11 0514) Resp:  [9-23] 16 (09/11 0514) BP: (127-171)/(53-88) 137/55 (09/11 0514) SpO2:  [97 %-100 %] 99 % (09/11 0514) Weight:  [81.2 kg (179 lb)] 81.2 kg (179 lb) (09/10 1245)  Intake/Output from previous day:  Intake/Output Summary (Last 24 hours) at 12/28/16 0748 Last data filed at 12/28/16 0700  Gross per 24 hour  Intake           4162.5 ml  Output             2105 ml  Net           2057.5 ml    Intake/Output this shift: No intake/output data recorded.  Labs:  Recent Labs  12/28/16 0510  HGB 11.7*    Recent Labs  12/28/16 0510  WBC 10.9*  RBC 3.76*  HCT 34.5*  PLT 151    Recent Labs  12/28/16 0510  NA 133*  K 3.8  CL 101  CO2 23  BUN 10  CREATININE 0.68  GLUCOSE 110*  CALCIUM 8.5*   No results for input(s): LABPT, INR in the last 72 hours.  EXAM General - Patient is Alert, Appropriate and Oriented Extremity - Neurovascular intact Sensation intact distally Intact pulses distally Dorsiflexion/Plantar flexion intact Dressing - dressing C/D/I Motor Function - intact, moving foot and toes well on exam.  Hemovac pulled without difficulty.  Past Medical History:  Diagnosis Date  . Coronary artery disease 03/16/2016   a. admx with angina and mildly elevated Troponin >> LHC: mLAD 30 >> CCB started for poss microvascular angina  . Hyperlipidemia 03/16/2016  . Hypertension   . PE (pulmonary thromboembolism) ([place]) 03/17/2016   Bilateral submassive pulmonary embolus with RUE  and RLE DVT on venous duplex 02/2016 c/b PEA arrest // Echo 03/21/16: mild LVH, EF 60-65, no RWMA, Gr 1 DD, normal RVSF  . Sinus bradycardia 03/31/2016  . Thrombocytopenia ([place]) 02/2016   Profound thrombocytopenia noted upon admission for pulmonary embolism >> initially thought to be from Hep induced thrombocytopenia given recent admit for cardiac cath and Heparin use // However, HIT Ab was neg (0.184) // Hematology consult pending  . Thyroid disease     Assessment/Plan: 1 Day Post-Op Procedure(s) (LRB): RIGHT TOTAL KNEE ARTHROPLASTY (Right) Active Problems:   OA (osteoarthritis) of knee  Estimated body mass index is 32.74 kg/m as calculated from the following:   Height as of this encounter: 5\' 2"  (1.575 m).   Weight as of this encounter: 81.2 kg (179 lb). Advance diet Up with therapy Plan for discharge tomorrow  DVT Prophylaxis - Xarelto Weight-Bearing as tolerated to right leg D/C O2 and Pulse OX and try on Room Air  Arlee Muslim, PA-C Orthopaedic Surgery 12/28/2016, 7:48 AM

## 2016-12-29 LAB — BASIC METABOLIC PANEL
Anion gap: 6 (ref 5–15)
BUN: 8 mg/dL (ref 6–20)
CO2: 29 mmol/L (ref 22–32)
Calcium: 8.5 mg/dL — ABNORMAL LOW (ref 8.9–10.3)
Chloride: 102 mmol/L (ref 101–111)
Creatinine, Ser: 0.61 mg/dL (ref 0.44–1.00)
GFR calc Af Amer: >60 mL/min (ref >60)
GFR calc non Af Amer: >60 mL/min (ref >60)
Glucose, Bld: 111 mg/dL — ABNORMAL HIGH (ref 65–99)
Potassium: 3.7 mmol/L (ref 3.5–5.1)
Sodium: 137 mmol/L (ref 135–145)

## 2016-12-29 LAB — CBC
HCT: 30 % — ABNORMAL LOW (ref 36.0–46.0)
Hemoglobin: 10.2 g/dL — ABNORMAL LOW (ref 12.0–15.0)
MCH: 31.7 pg (ref 26.0–34.0)
MCHC: 34 g/dL (ref 30.0–36.0)
MCV: 93.2 fL (ref 78.0–100.0)
Platelets: 140 10*3/uL — ABNORMAL LOW (ref 150–400)
RBC: 3.22 MIL/uL — ABNORMAL LOW (ref 3.87–5.11)
RDW: 12.7 % (ref 11.5–15.5)
WBC: 9.8 10*3/uL (ref 4.0–10.5)

## 2016-12-29 MED ORDER — FLEET ENEMA 7-19 GM/118ML RE ENEM: 1.0000 | ENEMA | Freq: Once | RECTAL | 0 refills | Status: DC | PRN

## 2016-12-29 MED ORDER — DOCUSATE SODIUM 100 MG PO CAPS: 100.0000 mg | ORAL_CAPSULE | Freq: Two times a day (BID) | ORAL | 0 refills | Status: DC

## 2016-12-29 MED ORDER — BISACODYL 10 MG RE SUPP: 10.0000 mg | Freq: Every day | RECTAL | 0 refills | Status: DC | PRN

## 2016-12-29 MED ORDER — DIPHENHYDRAMINE HCL 12.5 MG/5ML PO ELIX: 12.5000 mg | ORAL_SOLUTION | ORAL | 0 refills | Status: DC | PRN

## 2016-12-29 NOTE — Clinical Social Work Placement (Signed)
   CLINICAL SOCIAL WORK PLACEMENT  NOTE  Date:  12/29/2016  Patient Details  Name: Michele Thompson MRN: [id number] Date of Birth: [birth year]  Clinical Social Work is seeking post-discharge placement for this patient at the [place] level of care (*CSW will initial, date and re-position this form in  chart as items are completed):  Yes   Patient/family provided with [facility] Work Department's list of facilities offering this level of care within the geographic area requested by the patient (or if unable, by the patient's family).  Yes   Patient/family informed of their freedom to choose among providers that offer the needed level of care, that participate in Medicare, Medicaid or managed care program needed by the patient, have an available bed and are willing to accept the patient.  Yes   Patient/family informed of [place]'s ownership interest in [facility] and [facility], as well as of the fact that they are under no obligation to receive care at these facilities.  PASRR submitted to EDS on 12/27/16     PASRR number received on 12/27/16     Existing PASRR number confirmed on       FL2 transmitted to all facilities in geographic area requested by pt/family on 12/27/16     FL2 transmitted to all facilities within larger geographic area on       Patient informed that his/her managed care company has contracts with or will negotiate with certain facilities, including the following:        Yes   Patient/family informed of bed offers received.  Patient chooses bed at [place], [place]     Physician recommends and patient chooses bed at      Patient to be transferred to [place], [place] on 12/29/16.  Patient to be transferred to facility by [facility]     Patient family notified on 12/29/16 of transfer.  Name of family member notified:  Daughter in law     PHYSICIAN       Additional Comment: Pt / family are in agreement with  dc to [facility] ( [facility] ) today. PT approved transport by car. DC Summary sent to SNF for review. Scripts included in [employer]. # for report provided to [place] provided to pt.   _______________________________________________ Luretha Rued, [place] 12/29/2016, 5:07 PM

## 2016-12-29 NOTE — Progress Notes (Signed)
Physical Therapy Treatment Patient Details Name: Michele Thompson MRN: [id number] DOB: 1941/03/16 Today's Date: 12/29/2016    History of Present Illness s/p R TKA.  H/O MI, PE, tinnitus    PT Comments    Pt ambulated in hallway and performed LE exercises.  Pt to d/c to SNF today.   Follow Up Recommendations  SNF     Equipment Recommendations  None recommended by PT    Recommendations for Other Services       Precautions / Restrictions Precautions Precautions: Knee;Fall Required Braces or Orthoses: Knee Immobilizer - Right Restrictions Other Position/Activity Restrictions: WBAT    Mobility  Bed Mobility               General bed mobility comments: pt up in recliner on arrival  Transfers Overall transfer level: Needs assistance Equipment used: Rolling walker (2 wheeled) Transfers: Sit to/from Stand Sit to Stand: Min guard         General transfer comment: verbal cues for UE and LE positioning  Ambulation/Gait Ambulation/Gait assistance: Min guard Ambulation Distance (Feet): 70 Feet Assistive device: Rolling walker (2 wheeled) Gait Pattern/deviations: Step-to pattern;Decreased stance time - right;Antalgic     General Gait Details: verbal cues for sequence, RW positioning, step length   Stairs            Wheelchair Mobility    Modified Rankin (Stroke Patients Only)       Balance                                            Cognition Arousal/Alertness: Awake/alert Behavior During Therapy: WFL for tasks assessed/performed Overall Cognitive Status: Within Functional Limits for tasks assessed                                        Exercises Total Joint Exercises Ankle Circles/Pumps: AROM;Both;10 reps Quad Sets: AROM;Both;10 reps Gluteal Sets: AROM;Both;10 reps Short Arc QuadSinclair Ship;Right;10 reps Heel Slides: AAROM;Right;10 reps Hip ABduction/ADduction: AAROM;Right;10 reps    General Comments         Pertinent Vitals/Pain Pain Assessment: 0-10 Pain Score: 5  Pain Location: R knee Pain Descriptors / Indicators: Aching;Sore Pain Intervention(s): Limited activity within patient's tolerance;Repositioned;Monitored during session;Ice applied    Home Living                      Prior Function            PT Goals (current goals can now be found in the care plan section) Progress towards PT goals: Progressing toward goals    Frequency    7X/week      PT Plan Current plan remains appropriate    Co-evaluation              AM-PAC PT "6 Clicks" Daily Activity  Outcome Measure  Difficulty turning over in bed (including adjusting bedclothes, sheets and blankets)?: A Lot Difficulty moving from lying on back to sitting on the side of the bed? : Unable Difficulty sitting down on and standing up from a chair with arms (e.g., wheelchair, bedside commode, etc,.)?: Unable Help needed moving to and from a bed to chair (including a wheelchair)?: A Little Help needed walking in hospital room?: A Little Help needed climbing 3-5 steps with a railing? :  A Lot 6 Click Score: 12    End of Session Equipment Utilized During Treatment: Gait belt;Right knee immobilizer Activity Tolerance: Patient tolerated treatment well Patient left: with call bell/phone within reach;in chair   PT Visit Diagnosis: Difficulty in walking, not elsewhere classified (R26.2)     Time: 4514-6047 PT Time Calculation (min) (ACUTE ONLY): 22 min  Charges:  $Therapeutic Exercise: 8-22 mins                    G Codes:       Carmelia Bake, PT, DPT 12/29/2016 Pager: [phone]   York Ram E 12/29/2016, 2:44 PM

## 2016-12-29 NOTE — Progress Notes (Signed)
   Subjective: 2 Days Post-Op Procedure(s) (LRB): RIGHT TOTAL KNEE ARTHROPLASTY (Right) Patient reports pain as mild and moderate.   Patient seen in rounds for Dr. Wynelle Link. Patient is well, but has had some minor complaints of pain in the knee, requiring pain medications Patient is ready to go home  Objective: Vital signs in last 24 hours: Temp:  [97.8 F (36.6 C)-98.7 F (37.1 C)] 98 F (36.7 C) (09/12 0620) Pulse Rate:  [57-80] 80 (09/12 0620) Resp:  [16-17] 16 (09/12 0620) BP: (114-153)/(43-66) 114/66 (09/12 0620) SpO2:  [94 %-98 %] 94 % (09/12 0620)  Intake/Output from previous day:  Intake/Output Summary (Last 24 hours) at 12/29/16 0829 Last data filed at 12/29/16 8756  Gross per 24 hour  Intake          1212.83 ml  Output              950 ml  Net           262.83 ml    Intake/Output this shift: No intake/output data recorded.  Labs:  Recent Labs  12/28/16 0510 12/29/16 0533  HGB 11.7* 10.2*    Recent Labs  12/28/16 0510 12/29/16 0533  WBC 10.9* 9.8  RBC 3.76* 3.22*  HCT 34.5* 30.0*  PLT 151 140*    Recent Labs  12/28/16 0510 12/29/16 0533  NA 133* 137  K 3.8 3.7  CL 101 102  CO2 23 29  BUN 10 8  CREATININE 0.68 0.61  GLUCOSE 110* 111*  CALCIUM 8.5* 8.5*   No results for input(s): LABPT, INR in the last 72 hours.  EXAM: General - Patient is Alert, Appropriate and Oriented Extremity - Neurovascular intact Sensation intact distally Intact pulses distally Dorsiflexion/Plantar flexion intact Incision - clean, dry, no drainage Motor Function - intact, moving foot and toes well on exam.   Assessment/Plan: 2 Days Post-Op Procedure(s) (LRB): RIGHT TOTAL KNEE ARTHROPLASTY (Right) Procedure(s) (LRB): RIGHT TOTAL KNEE ARTHROPLASTY (Right) Past Medical History:  Diagnosis Date  . Coronary artery disease 03/16/2016   a. admx with angina and mildly elevated Troponin >> LHC: mLAD 30 >> CCB started for poss microvascular angina  .  Hyperlipidemia 03/16/2016  . Hypertension   . PE (pulmonary thromboembolism) ([place]) 03/17/2016   Bilateral submassive pulmonary embolus with RUE and RLE DVT on venous duplex 02/2016 c/b PEA arrest // Echo 03/21/16: mild LVH, EF 60-65, no RWMA, Gr 1 DD, normal RVSF  . Sinus bradycardia 03/31/2016  . Thrombocytopenia ([place]) 02/2016   Profound thrombocytopenia noted upon admission for pulmonary embolism >> initially thought to be from Hep induced thrombocytopenia given recent admit for cardiac cath and Heparin use // However, HIT Ab was neg (0.184) // Hematology consult pending  . Thyroid disease    Active Problems:   OA (osteoarthritis) of knee  Estimated body mass index is 32.74 kg/m as calculated from the following:   Height as of this encounter: 5\' 2"  (1.575 m).   Weight as of this encounter: 81.2 kg (179 lb). Up with therapy Discharge to SNF Diet - Cardiac diet Follow up - in 2 weeks Activity - WBAT Disposition - Skilled nursing facility Condition Upon Discharge - Stable D/C Meds - See DC Summary DVT Prophylaxis - Xarelto  Arlee Muslim, PA-C Orthopaedic Surgery 12/29/2016, 8:29 AM

## 2016-12-30 NOTE — Addendum Note (Signed)
Addendum  created 12/30/16 0818 by Belinda Block, MD   Anesthesia Intra Blocks edited, Sign clinical note

## 2017-02-10 ENCOUNTER — Telehealth: Payer: Self-pay | Admitting: Pulmonary Disease

## 2017-02-10 NOTE — Telephone Encounter (Signed)
Spoke with pt, states she took her usual dose of xarelto last night at 6:00, and also accidentally took xarelto this morning.  Pt has not taken any additional ibuprofen/asa/nsaids today.   Pt is requesting recs on this as well.  JN please advise.  Thanks.

## 2017-02-10 NOTE — Telephone Encounter (Signed)
Spoke with pt she states she is very anxious, shaky, and lack energy and doesnt want to do anything. She wants to know if JN thinks it is because she has taken Xarelto for so long. She had complete knee surgery but she doesn't have any energy to do this. She would like to speak to [facility] about keeping her on the Xarelto since her appt was rescheduled from 10/26  to Nov 2. Please call JN.

## 2017-02-10 NOTE — Telephone Encounter (Signed)
Spoke with the patient regarding her additional dose of Xarelto this morning. Recommended that she skip this evening's dose and restart tomorrow night at 6pm as scheduled. At her follow-up we will do a D-dimer and discuss stopping her Xarelto depending on this result. I also recommended not taking the anxiety medication (Lexapro, Cymbalta, Celexa) with the Xarelto given the interaction and increased bleeding risk.

## 2017-02-10 NOTE — Telephone Encounter (Signed)
See my other phone note.

## 2017-02-10 NOTE — Telephone Encounter (Signed)
Will close encounter, as another as been created.

## 2017-02-10 NOTE — Telephone Encounter (Signed)
Michele Glazier, MD      02/10/17 4:35 PM  Note    Spoke with the patient regarding her additional dose of Xarelto this morning. Recommended that she skip this evening's dose and restart tomorrow night at 6pm as scheduled. At her follow-up we will do a D-dimer and discuss stopping her Xarelto depending on this result. I also recommended not taking the anxiety medication (Lexapro, Cymbalta, Celexa) with the Xarelto given the interaction and increased bleeding ri

## 2017-02-10 NOTE — Telephone Encounter (Signed)
Spoke with Lenna Sciara with [facility], who states Courtney, NP is wanting to start pt on anxiety medication. Pt is concerned about medication interacting with Xarelto. Melissa states Loma Sousa did some research and did not find where there would be any interactions. Pt would like to know what anxiety meds can be taken with Xarelto.  JN please advise. Thanks.

## 2017-02-10 NOTE — Telephone Encounter (Signed)
Attempted to contact the patient but no answer. Left a message and will re-attempt to call this afternoon.

## 2017-02-10 NOTE — Telephone Encounter (Signed)
Patient called and states that she has taken her regular dose of Xarelto last night at 6pm. She accidentally took it again this morning, she wants to know if she take it again tonight. She can be reached at [phone] -pr

## 2017-02-10 NOTE — Telephone Encounter (Signed)
Will hold message in triage until 02/11/17 to call [facility] to make [facility] aware.

## 2017-02-11 ENCOUNTER — Ambulatory Visit: Payer: Self-pay | Admitting: Pulmonary Disease

## 2017-02-11 NOTE — Telephone Encounter (Signed)
Called and spoke with [facility] and they are aware of JN recs and that the pt will not start any anxiety meds due to pt being on xarelto.  Pt is aware per JN.

## 2017-02-16 NOTE — Progress Notes (Signed)
Subjective:    Patient ID: Michele Thompson, female    DOB: February 11, 1941, 76 y.o.   MRN: [id number]  C.C.:  Follow-up for Pulmonary Emboli.  HPI Since last appointment patient underwent total right knee replacement in September. She has been undergoing rehabilitation.  Pulmonary emboli: Identified on CT angiogram November 2017. Likely provoked with left heart catheterization/immobilization as well as estrogen patch for hormone replacement. Evaluated by hematology with recommendation for treatment of 6 months to 1 year with systemic anticoagulation. She reports her breathing is doing ok. She denies any coughing or wheezing. She admits she doesn't want to take the blood thinner any more if she doesn't have to.   Review of Systems She reports she does have some fatigue. She does have some abdominal discomfort. No emesis but does have intermittent nausea relieved with Zofran. Denies any gross hematuria but does have some microscopic hematuria. No melena or hematochezia. No chest pain or pressure.   No Known Allergies  Current Outpatient Prescriptions on File Prior to Visit  Medication Sig Dispense Refill  . acetaminophen (TYLENOL) 650 MG CR tablet Take 1,300 mg by mouth at bedtime.    Marland Kitchen atorvastatin (LIPITOR) 40 MG tablet Take 1 tablet (40 mg total) by mouth daily at 6 PM. 90 tablet 3  . hydrochlorothiazide (MICROZIDE) 12.5 MG capsule Take 12.5 mg by mouth daily.    Marland Kitchen levothyroxine (SYNTHROID, LEVOTHROID) 100 MCG tablet Take 100 mcg by mouth daily before breakfast.    . lisinopril (PRINIVIL,ZESTRIL) 40 MG tablet Take 1 tablet (40 mg total) by mouth daily. 90 tablet 1  . polyethylene glycol (MIRALAX / GLYCOLAX) packet Take 17 g by mouth daily.    . rivaroxaban (XARELTO) 20 MG TABS tablet Take 1 tablet (20 mg total) by mouth daily with supper. 30 tablet 10  . sennosides-docusate sodium (SENOKOT-S) 8.6-50 MG tablet Take 2 tablets by mouth at bedtime.     No current facility-administered medications on  file prior to visit.     Past Medical History:  Diagnosis Date  . Coronary artery disease 03/16/2016   a. admx with angina and mildly elevated Troponin >> LHC: mLAD 30 >> CCB started for poss microvascular angina  . Hyperlipidemia 03/16/2016  . Hypertension   . PE (pulmonary thromboembolism) ([place]) 03/17/2016   Bilateral submassive pulmonary embolus with RUE and RLE DVT on venous duplex 02/2016 c/b PEA arrest // Echo 03/21/16: mild LVH, EF 60-65, no RWMA, Gr 1 DD, normal RVSF  . Sinus bradycardia 03/31/2016  . Thrombocytopenia ([place]) 02/2016   Profound thrombocytopenia noted upon admission for pulmonary embolism >> initially thought to be from Hep induced thrombocytopenia given recent admit for cardiac cath and Heparin use // However, HIT Ab was neg (0.184) // Hematology consult pending  . Thyroid disease     Past Surgical History:  Procedure Laterality Date  . CARDIAC CATHETERIZATION N/A 03/15/2016   Procedure: Left Heart Cath and Coronary Angiography;  Surgeon: [name], MD;  Location: [facility];  Service: Cardiovascular;  Laterality: N/A;  . TOTAL KNEE ARTHROPLASTY Right 12/27/2016   Procedure: RIGHT TOTAL KNEE ARTHROPLASTY;  Surgeon: Gaynelle Arabian, MD;  Location: [facility];  Service: Orthopedics;  Laterality: Right;  Marland Kitchen VAGINAL HYSTERECTOMY  1983   endometriosis    Family History  Problem Relation Age of Onset  . Heart failure Mother   . Heart failure Father   . Heart failure Maternal Grandmother   . Myasthenia gravis Brother   . Stroke Brother   .  Clotting disorder Neg Hx     Social History   Social History  . Marital status: Widowed    Spouse name: N/A  . Number of children: N/A  . Years of education: N/A   Social History Main Topics  . Smoking status: Never Smoker  . Smokeless tobacco: Never Used  . Alcohol use No  . Drug use: No  . Sexual activity: No     Comment: 1st intercourse 76 yo-Fewer than 5 partners   Other Topics Concern  . None    Social History Narrative   Financial controller Pulmonary:   Originally from [place]. Moved to [place] in 2012. 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      Objective:   Physical Exam BP 124/68 (BP Location: Right Arm, Cuff Size: Normal)   Pulse 62   Ht 5\' 3"  (1.6 m)   Wt 173 lb (78.5 kg)   SpO2 96%   BMI 30.65 kg/m   General:  Mild central obesity. Awake. Alert.  Integument:  No rash. No bruising. Warm. Dry. Healed surgical incision of her right knee. Extremities:  No cyanosis or clubbing.  HEENT: Moist membranes. No scleral icterus. No scleral injection Cardiovascular:  Regular rate. Compression stockings in place over bilateral lower extremities. Unable to appreciate JVD.  Pulmonary:  Clear bilaterally to auscultation. Normal work of breathing on room air. Abdomen: Soft. Normal bowel sounds. Protuberant. Musculoskeletal:  Normal bulk and tone. No joint deformity or effusion appreciated. Neurological:  Cranial nerves 2-12 grossly in tact. No meningismus. Moving all 4 extremities equally.   PFT 10/11/16: FVC 2.63 L (99%) FEV1 2.18 L (109%) FEV1/FVC 0.83 FEF 25-75 2.83 L (182%) negative bronchodilator response TLC 5.66 L (115%) RV 131% ERV 19% DLCO corrected 74%  6MWT 11/04/16:  Walked 168 meters / Baseline Sat 99% on RA / nadir Sat 91% on RA (walked with rolling walker)  IMAGING CTA CHEST 03/17/16 (previously reviewed by me):  Bilateral pulmonary embolism extending from Main pulmonary artery to all lobes of both lungs. Positive for acute pulmonary embolism with RV/LV ratio 2.2.  CARDIAC TTE (03/21/16): LV normal in size with mild LVH. EF 60-65%. No regional wall motion abnormalities & grade 1 diastolic dysfunction. LA & RA normal in size. RV normal in size and function. No aortic stenosis or regurgitation. Aortic root normal in size. No mitral stenosis or regurgitation. Trivial pulmonic regurgitation. Trivial tricuspid regurgitation.  No pericardial effusion.  LHC (03/16/16): Nonobstructive CAD w/ mid-LAD irregularity. Normal LV function.  LABS 10/03/16 CBC: 10.0/12.8/30.2/171 BMP: 137/60.8/101/26/10/0.84/118/9.4  03/17/16 HITT antibody: 0.184 (negative) D-dimer: 3.46 Fibrinogen: 430 INR: 1.06 PTT: 26    Assessment & Plan:  76 y.o. female with acute pulmonary embolism diagnosed in November 2017 after left heart catheterization while on hormone replacement therapy. This marks approximately 12 months of systemic anticoagulation with Xarelto. I had a lengthy discussion with the patient regarding the risks of continued oral anticoagulant therapy and bleeding as well as the [place] decreasing risk of recurrent thromboembolism and DVT. At this point the patient seems to be recovering well from her right knee replacement. I believe she would benefit from anti-inflammatory medication to help with pain control and as she is preparing to come off Xarelto today this may help to further speed her recovery. We did discuss her ongoing risk of forming a DVT and the need to continue exercise and routine mobilization during long trips. I instructed the patient to contact our office if she  had questions or concerns before her next appointment.  1. Pulmonary emboli: Checking d-dimer today. Stopping systemic anticoagulation with Xarelto. Advised patient to avoid NSAIDs and daily aspirin for 48-72 hours. 2. Follow-up: Return to clinic in 3 months or sooner if needed.  I have spent a total of 29 minutes of time on the patient's office visit today today with more than 50% of that time spent face-to-face with the patient during her visit.  Sonia Baller Ashok Cordia, M.D. [facility] Pager:  [phone] After 3pm or if no response, call [phone] 11:17 AM 02/18/17

## 2017-02-18 ENCOUNTER — Encounter: Payer: Self-pay | Admitting: Pulmonary Disease

## 2017-02-18 ENCOUNTER — Ambulatory Visit (INDEPENDENT_AMBULATORY_CARE_PROVIDER_SITE_OTHER): Payer: Medicare Other | Admitting: Pulmonary Disease

## 2017-02-18 ENCOUNTER — Other Ambulatory Visit: Payer: Medicare Other

## 2017-02-18 VITALS — BP 124/68 | HR 62 | Ht 63.0 in | Wt 173.0 lb

## 2017-02-18 DIAGNOSIS — I2699 Other pulmonary embolism without acute cor pulmonale: Secondary | ICD-10-CM

## 2017-02-18 NOTE — Patient Instructions (Addendum)
   Call my office if you have any questions or concerns before your next appointment.  We are stopping your Xarelto today. Avoid any Aspirin or NSAIDs like Aleve or Ibuprofen for 48-72 hours.  You can start taking a daily Aspirin 81mg  after 48-72 hours.  TESTS ORDERED: 1. D-dimer today

## 2017-02-19 LAB — D-DIMER, QUANTITATIVE: D-Dimer, Quant: 1.08 mcg/mL FEU — ABNORMAL HIGH (ref <0.50)

## 2017-04-11 ENCOUNTER — Other Ambulatory Visit: Payer: Self-pay | Admitting: Cardiology

## 2017-04-11 NOTE — Telephone Encounter (Signed)
REFILL 

## 2017-05-19 ENCOUNTER — Ambulatory Visit (INDEPENDENT_AMBULATORY_CARE_PROVIDER_SITE_OTHER): Payer: PPO | Admitting: Acute Care

## 2017-05-19 ENCOUNTER — Encounter: Payer: Self-pay | Admitting: Acute Care

## 2017-05-19 ENCOUNTER — Other Ambulatory Visit: Payer: PPO

## 2017-05-19 VITALS — BP 138/80 | HR 62 | Ht 63.0 in | Wt 180.4 lb

## 2017-05-19 DIAGNOSIS — T81718A Complication of other artery following a procedure, not elsewhere classified, initial encounter: Principal | ICD-10-CM

## 2017-05-19 DIAGNOSIS — I2699 Other pulmonary embolism without acute cor pulmonale: Secondary | ICD-10-CM

## 2017-05-19 NOTE — Assessment & Plan Note (Addendum)
Patient continues to do well off anticoagulation She denies shortness of breath, chest pain, leg or ankle swelling. Plan We will do a d-dimer today. We will call you with results Clinically no evidence of PE Follow up in 6 months with PFT's before with  or Dr. Vaughan Browner  Follow up with PCP for any continued dizziness. Please contact office for sooner follow up if symptoms do not improve or worsen or seek emergency care  Please reassign to Dr. Vaughan Browner

## 2017-05-19 NOTE — Progress Notes (Signed)
History of Present Illness Michele Thompson is a 77 y.o. female  Never smoker with history of PE, on 02/2016. This was thought to be provoked by hormone therapy. She was followed by Dr. Ashok Cordia.   Pulmonary emboli: Identified on CT angiogram November 2017. Likely provoked with left heart catheterization/immobilization as well as estrogen patch for hormone replacement. Evaluated by hematology with recommendation for treatment of 6 months to 1 year with systemic anticoagulation. She reports her breathing is doing ok. She denies any coughing or wheezing. She admits she doesn't want to take the blood thinner any more if she doesn't have to.    05/19/2017  Follow up for PE: Pt. Presents for follow up. She states she came off her blood thinner Jennye Moccasin) in November of 2018, after 12 months of therapy.No leg pain, no chest pain, no SOB. . Off hormone therapy, which they think was the etiology of clot..Recent knee replacement 12/2016. She is active and she has completed her physical therapy. She has started playing ping pong again.She did have an episode of dizziness in the office today. She states she thinks it might be due to hunger .It self resolved.  He really likes her she has been trying to drink plenty of water.She is on blood pressure medication. Blood pressure did not drop or change. She denies fever, chest pain, orthopnea or hemoptysis.   Test Results: 05/19/2017>> D-Dimer>> pending  PFT 10/11/16: FVC 2.63 L (99%) FEV1 2.18 L (109%) FEV1/FVC 0.83 FEF 25-75 2.83 L (182%) negative bronchodilator response TLC 5.66 L (115%) RV 131% ERV 19% DLCO corrected 74%  6MWT 11/04/16:  Walked 168 meters / Baseline Sat 99% on RA / nadir Sat 91% on RA (walked with rolling walker)  IMAGING CTA CHEST 03/17/16 (previously reviewed by me): Bilateral pulmonary embolism extending from Main pulmonary artery to all lobes of both lungs. Positive for acute pulmonary embolism with RV/LV ratio 2.2.  CARDIAC TTE  (03/21/16):LV normal in size with mild LVH. EF 60-65%.No regional wall motion abnormalities &grade 1 diastolic dysfunction. LA &RA normal in size. RV normal in size and function. No aortic stenosis or regurgitation. Aortic root normal in size. No mitral stenosis or regurgitation. Trivial pulmonic regurgitation. Trivial tricuspid regurgitation. No pericardial effusion.  LHC (03/16/16): Nonobstructive CAD w/ mid-LAD irregularity. Normal LV function.  LABS 10/03/16 CBC: 10.0/12.8/30.2/171 BMP: 137/60.8/101/26/10/0.84/118/9.4  03/17/16 HITTantibody: 0.184 (negative) D-dimer: 3.46 Fibrinogen: 430 INR: 1.06 PTT: 26  CBC Latest Ref Rng & Units 12/29/2016 12/28/2016 12/15/2016  WBC 4.0 - 10.5 K/uL 9.8 10.9(H) 5.1  Hemoglobin 12.0 - 15.0 g/dL 10.2(L) 11.7(L) 12.8  Hematocrit 36.0 - 46.0 % 30.0(L) 34.5(L) 38.1  Platelets 150 - 400 K/uL 140(L) 151 185    BMP Latest Ref Rng & Units 12/29/2016 12/28/2016 12/15/2016  Glucose 65 - 99 mg/dL 111(H) 110(H) 98  BUN 6 - 20 mg/dL 8 10 14   Creatinine 0.44 - 1.00 mg/dL 0.61 0.68 0.78  Sodium 135 - 145 mmol/L 137 133(L) 137  Potassium 3.5 - 5.1 mmol/L 3.7 3.8 3.6  Chloride 101 - 111 mmol/L 102 101 103  CO2 22 - 32 mmol/L 29 23 27   Calcium 8.9 - 10.3 mg/dL 8.5(L) 8.5(L) 9.3    BNP    Component Value Date/Time   BNP 357.2 (H) 03/17/2016 0534    Because she is English because she is English PFT    Component Value Date/Time   FEV1PRE 2.18 10/11/2016 0930   FEV1POST 2.31 10/11/2016 0930   FVCPRE 2.63 10/11/2016 0930  FVCPOST 2.77 10/11/2016 0930   TLC 5.66 10/11/2016 0930   DLCOUNC 16.53 10/11/2016 0930   PREFEV1FVCRT 83 10/11/2016 0930   PSTFEV1FVCRT 84 10/11/2016 0930    No results found.   Past medical hx Past Medical History:  Diagnosis Date  . Coronary artery disease 03/16/2016   a. admx with angina and mildly elevated Troponin >> LHC: mLAD 30 >> CCB started for poss microvascular angina  . Hyperlipidemia 03/16/2016  .  Hypertension   . PE (pulmonary thromboembolism) ([place]) 03/17/2016   Bilateral submassive pulmonary embolus with RUE and RLE DVT on venous duplex 02/2016 c/b PEA arrest // Echo 03/21/16: mild LVH, EF 60-65, no RWMA, Gr 1 DD, normal RVSF  . Sinus bradycardia 03/31/2016  . Thrombocytopenia ([place]) 02/2016   Profound thrombocytopenia noted upon admission for pulmonary embolism >> initially thought to be from Hep induced thrombocytopenia given recent admit for cardiac cath and Heparin use // However, HIT Ab was neg (0.184) // Hematology consult pending  . Thyroid disease      Social History   Tobacco Use  . Smoking status: Never Smoker  . Smokeless tobacco: Never Used  Substance Use Topics  . Alcohol use: No    Alcohol/week: 0.0 oz  . Drug use: No    Ms.Boehme reports that  has never smoked. she has never used smokeless tobacco. She reports that she does not drink alcohol or use drugs.  Tobacco Cessation: Never smoker  Past surgical hx, Family hx, Social hx all reviewed.  Current Outpatient Medications on File Prior to Visit  Medication Sig  . atorvastatin (LIPITOR) 40 MG tablet Take 1 tablet (40 mg total) by mouth daily at 6 PM.  . hydrochlorothiazide (MICROZIDE) 12.5 MG capsule Take 12.5 mg by mouth daily.  Marland Kitchen levothyroxine (SYNTHROID, LEVOTHROID) 100 MCG tablet Take 100 mcg by mouth daily before breakfast.  . lisinopril (PRINIVIL,ZESTRIL) 40 MG tablet TAKE 1 TABLET EACH DAY.  . sennosides-docusate sodium (SENOKOT-S) 8.6-50 MG tablet Take 2 tablets by mouth at bedtime.   No current facility-administered medications on file prior to visit.      No Known Allergies  Review Of Systems:  Constitutional:   No  weight loss, night sweats,  Fevers, chills, fatigue, or  lassitude.  HEENT:   No headaches,  Difficulty swallowing,  Tooth/dental problems, or  Sore throat,                No sneezing, itching, ear ache, nasal congestion, post nasal drip,   CV:  No chest pain,  Orthopnea, PND,  swelling in lower extremities, anasarca, dizziness, palpitations, syncope.   GI  No heartburn, indigestion, abdominal pain, nausea, vomiting, diarrhea, change in bowel habits, loss of appetite, bloody stools.   Resp: No shortness of breath with exertion or at rest.  No excess mucus, no productive cough,  No non-productive cough,  No coughing up of blood.  No change in color of mucus.  No wheezing.  No chest wall deformity  Skin: no rash or lesions.  GU: no dysuria, change in color of urine, no urgency or frequency.  No flank pain, no hematuria   MS:  No joint pain or swelling.  No decreased range of motion.  No back pain.  Psych:  No change in mood or affect. No depression or anxiety.  No memory loss.   Vital Signs BP 138/80   Pulse 62   Ht 5\' 3"  (1.6 m)   Wt 180 lb 6.4 oz (81.8 kg)   SpO2  98%   BMI 31.96 kg/m    Physical Exam:  General- No distress,  A&Ox3 pleasant and appropriate ENT: No sinus tenderness, TM clear, pale nasal mucosa, no oral exudate,no post nasal drip, no LAN Cardiac: S1, S2, regular rate and rhythm, no murmur Chest: No wheeze/ rales/ dullness; no accessory muscle use, no nasal flaring, no sternal retractions Abd.: Soft Non-tender, bowel sounds positive, nondistended Ext: No clubbing cyanosis, edema Neuro:  normal strength moving all extremities x4, alert and oriented x3.,  Skin: No rashes, warm and dry Psych: normal mood and behavior   Assessment/Plan  PE (pulmonary thromboembolism) ([place]) Patient continues to do well off anticoagulation She denies shortness of breath, chest pain, leg or ankle swelling. Plan We will do a d-dimer today. We will call you with results Clinically no evidence of PE Follow up in 6 months with PFT's before with  or Dr. Vaughan Browner  Follow up with PCP for any continued dizziness. Please contact office for sooner follow up if symptoms do not improve or worsen or seek emergency care  Please reassign to Dr.  Orland Mustard, NP 05/19/2017  5:00 PM

## 2017-05-19 NOTE — Patient Instructions (Addendum)
It is good to see you today. We will do a d-dimer today. We will call you with results Follow up in 6 months with PFT's before with Sarah or Dr. Vaughan Browner  Follow up with PCP for any continued dizziness. Please contact office for sooner follow up if symptoms do not improve or worsen or seek emergency care  Please reassign to Dr. Vaughan Browner

## 2017-05-20 LAB — D-DIMER, QUANTITATIVE: D-Dimer, Quant: 1.81 mcg/mL FEU — ABNORMAL HIGH (ref <0.50)

## 2017-05-26 ENCOUNTER — Ambulatory Visit: Payer: PPO | Admitting: Cardiovascular Disease

## 2017-05-26 ENCOUNTER — Encounter: Payer: Self-pay | Admitting: Cardiovascular Disease

## 2017-05-26 VITALS — BP 108/68 | HR 67 | Resp 12 | Ht 62.0 in | Wt 182.4 lb

## 2017-05-26 DIAGNOSIS — Z86711 Personal history of pulmonary embolism: Secondary | ICD-10-CM | POA: Diagnosis not present

## 2017-05-26 DIAGNOSIS — R0602 Shortness of breath: Secondary | ICD-10-CM | POA: Diagnosis not present

## 2017-05-26 DIAGNOSIS — I2699 Other pulmonary embolism without acute cor pulmonale: Secondary | ICD-10-CM | POA: Diagnosis not present

## 2017-05-26 DIAGNOSIS — E7800 Pure hypercholesterolemia, unspecified: Secondary | ICD-10-CM | POA: Diagnosis not present

## 2017-05-26 DIAGNOSIS — I498 Other specified cardiac arrhythmias: Secondary | ICD-10-CM

## 2017-05-26 DIAGNOSIS — I495 Sick sinus syndrome: Secondary | ICD-10-CM

## 2017-05-26 DIAGNOSIS — R55 Syncope and collapse: Secondary | ICD-10-CM | POA: Diagnosis not present

## 2017-05-26 DIAGNOSIS — I10 Essential (primary) hypertension: Secondary | ICD-10-CM | POA: Diagnosis not present

## 2017-05-26 DIAGNOSIS — R7989 Other specified abnormal findings of blood chemistry: Secondary | ICD-10-CM

## 2017-05-26 NOTE — Progress Notes (Signed)
.  Cardiology Office Note:    Date:  05/26/2017   ID:  Michele Thompson, DOB 1941/01/08, MRN [id number]  PCP:  Kathyrn Lass, MD  Cardiologist:  No primary care provider on file.   Referring MD: Kathyrn Lass, MD   Chief Complaint  Patient presents with  . Follow-up    PE, SOB, dizzy  Exertional dyspnea  History of Present Illness:    Michele Thompson is a 77 y.o. female with a hx of massive pulmonary embolism, complicated by cardiorespiratory arrest, while on estrogen therapy in 2017.  She also has minor coronary artery disease and suspicion for coronary vasospasm on previous angiography, hypertension and hyperlipidemia.  She returns with complaints of gradually worsening dyspnea.  She recently moved to friend's home [place].  She has tried to become physically active, but is held back by dyspnea.  She had to stop playing ping-pong because of shortness of breath.  When asked to take repeated deep breaths doing a physical exam she felt that she was going to pass out.  Recently seen in the pulmonology office and she had 2 consecutive abnormal D-dimers on 2 separate dates.  It was felt that these may be related to persistent inflammation at the site of her recent right total knee replacement.  That knee is still very uncomfortable, although she can walk without assistive devices.  She had pulmonary function test there were completely normal about 7 months ago.  Anticoagulation was stopped a few months ago and she believes that her shortness of breath began worsening at that time.  The patient specifically denies any chest pain at rest or with exertion, dyspnea at rest, orthopnea, paroxysmal nocturnal dyspnea, syncope, palpitations, focal neurological deficits, intermittent claudication, lower extremity edema, unexplained weight gain, cough, hemoptysis or wheezing.  She does not appear to be in any distress and resting heart rate is only 56 bpm (sinus bradycardia).  Her blood pressure is normal.   In 2017 a  48-hour Holter monitor showed persistent mild-moderate sinus bradycardia and blunted circadian rhythm variation.  She had occasional sinus pauses up to 2.3 seconds in duration and rare PACs.  Her beta-blocker was discontinued at that time.   Past Medical History:  Diagnosis Date  . Coronary artery disease 03/16/2016   a. admx with angina and mildly elevated Troponin >> LHC: mLAD 30 >> CCB started for poss microvascular angina  . Hyperlipidemia 03/16/2016  . Hypertension   . PE (pulmonary thromboembolism) ([place]) 03/17/2016   Bilateral submassive pulmonary embolus with RUE and RLE DVT on venous duplex 02/2016 c/b PEA arrest // Echo 03/21/16: mild LVH, EF 60-65, no RWMA, Gr 1 DD, normal RVSF  . Sinus bradycardia 03/31/2016  . Thrombocytopenia ([place]) 02/2016   Profound thrombocytopenia noted upon admission for pulmonary embolism >> initially thought to be from Hep induced thrombocytopenia given recent admit for cardiac cath and Heparin use // However, HIT Ab was neg (0.184) // Hematology consult pending  . Thyroid disease     Past Surgical History:  Procedure Laterality Date  . CARDIAC CATHETERIZATION N/A 03/15/2016   Procedure: Left Heart Cath and Coronary Angiography;  Surgeon: [name], MD;  Location: [facility];  Service: Cardiovascular;  Laterality: N/A;  . TOTAL KNEE ARTHROPLASTY Right 12/27/2016   Procedure: RIGHT TOTAL KNEE ARTHROPLASTY;  Surgeon: Gaynelle Arabian, MD;  Location: [facility];  Service: Orthopedics;  Laterality: Right;  Marland Kitchen VAGINAL HYSTERECTOMY  1983   endometriosis    Current Medications: Current Meds  Medication Sig  .  aspirin EC 81 MG tablet Take 81 mg by mouth daily.  Marland Kitchen atorvastatin (LIPITOR) 40 MG tablet Take 1 tablet (40 mg total) by mouth daily at 6 PM.  . Cholecalciferol (VITAMIN D3) 2000 units TABS Take 1 tablet by mouth daily.  . cyanocobalamin 100 MCG tablet Take 1,000 mcg by mouth daily.  . hydrochlorothiazide (MICROZIDE) 12.5 MG capsule Take 12.5 mg  by mouth daily.  Marland Kitchen levothyroxine (SYNTHROID, LEVOTHROID) 100 MCG tablet Take 100 mcg by mouth daily before breakfast.  . lisinopril (PRINIVIL,ZESTRIL) 40 MG tablet TAKE 1 TABLET EACH DAY.  . sennosides-docusate sodium (SENOKOT-S) 8.6-50 MG tablet Take 2 tablets by mouth at bedtime.     Allergies:   Patient has no known allergies.   Social History   Socioeconomic History  . Marital status: Widowed    Spouse name: None  . Number of children: None  . Years of education: None  . Highest education level: None  Social Needs  . Financial resource strain: None  . Food insecurity - worry: None  . Food insecurity - inability: None  . Transportation needs - medical: None  . Transportation needs - non-medical: None  Occupational History  . None  Tobacco Use  . Smoking status: Never Smoker  . Smokeless tobacco: Never Used  Substance and Sexual Activity  . Alcohol use: No    Alcohol/week: 0.0 oz  . Drug use: No  . Sexual activity: No    Birth control/protection: Surgical    Comment: 1st intercourse 77 yo-Fewer than 5 partners  Other Topics Concern  . None  Social History Narrative   [employer] [facility]:   Originally from [place]. Moved to [place] in 2012. 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     Family History: The patient's family history includes Heart failure in her father, maternal grandmother, and mother; Myasthenia gravis in her brother; Stroke in her brother. There is no history of Clotting disorder.  ROS:   Please see the history of present illness.     All other systems reviewed and are negative.  EKGs/Labs/Other Studies Reviewed:    The following studies were reviewed today: Notes from pulmonary clinic, pulmonary function tests,  EKG:  EKG is  ordered today.  The ekg ordered today demonstrates sinus bradycardia at 56 bpm, otherwise normal.  QTc 401 ms  Recent Labs: 12/15/2016: ALT 16 12/29/2016: BUN 8;  Creatinine, Ser 0.61; Hemoglobin 10.2; Platelets 140; Potassium 3.7; Sodium 137  Recent Lipid Panel    Component Value Date/Time   CHOL 142 03/15/2016 0524   TRIG 73 03/15/2016 0524   HDL 44 03/15/2016 0524   CHOLHDL 3.2 03/15/2016 0524   VLDL 15 03/15/2016 0524   LDLCALC 83 03/15/2016 0524    Physical Exam:    VS:  BP 108/68   Pulse 67   Resp 12   Ht 5\' 2"  (1.575 m)   Wt 182 lb 6.4 oz (82.7 kg)   SpO2 96%   BMI 33.36 kg/m     Wt Readings from Last 3 Encounters:  05/26/17 182 lb 6.4 oz (82.7 kg)  05/19/17 180 lb 6.4 oz (81.8 kg)  02/18/17 173 lb (78.5 kg)     GEN: Mildly obese, well nourished, well developed in no acute distress HEENT: Normal NECK: No JVD; No carotid bruits LYMPHATICS: No lymphadenopathy CARDIAC: RRR, no murmurs, rubs, gallops RESPIRATORY:  Clear to auscultation without rales, wheezing or rhonchi  ABDOMEN: Soft, non-tender, non-distended MUSCULOSKELETAL:  No edema;  No deformity  SKIN: Warm and dry NEUROLOGIC:  Alert and oriented x 3 PSYCHIATRIC:  Normal affect   ASSESSMENT:    1. Shortness of breath   2. History of pulmonary embolism   3. Chronotropic incompetence with sinus node dysfunction (HCC)   4. Essential hypertension   5. Hypercholesterolemia   6. Positive D dimer    PLAN:    In order of problems listed above:  1. Dyspnea: The patient believes this is steadily worse since coming off the anticoagulant.  She had a couple of abnormal d-dimer tests, which while they are not diagnostic maintain some concern for possible thromboembolic events.  She has not had any swelling or tenderness and denies cough or hemoptysis.  We will check an echocardiogram to look for residual pulmonary artery hypertension after her massive pulmonary embolism.  I also think it is important to check a chest CT angiogram to exclude recent pulmonary embolism after stopping her anticoagulants. 2. SSS/chronotropic incompetence: If her workup is otherwise negative, it is  possible that her symptoms are related to chronotropic incompetence.  This was fairly clearly demonstrated on her Holter monitor in late 2017 (although she was on beta-blockers at the time).  We might want to re-documented that with a Holter monitor now that she is off beta-blockers.  If no other source of dyspnea is identified, she might benefit from implantation of a dual-chamber permanent pacemaker with rate response features (preferably a minute ventilation device such as a Chiropractor or a closed loop sensor device from [employer]). 3. HTN: well-controlled.  Avoid agents with negative chronotropic effect. 4. HLP: On statin.  She had relatively minor coronary atherosclerosis at cardiac catheterization in the recent past.  Target LDL preferably under 70.  Retrospectively, I wonder if her elevated troponin was actually a sign of subclinical pulmonary embolism which became apparent the day after her cardiac catheterization.     Medication Adjustments/Labs and Tests Ordered: Current medicines are reviewed at length with the patient today.  Concerns regarding medicines are outlined above.  No orders of the defined types were placed in this encounter.  No orders of the defined types were placed in this encounter.   Signed, Sanda Klein, MD  05/26/2017 11:29 AM    [facility]

## 2017-05-26 NOTE — Patient Instructions (Signed)
Dr Sallyanne Kuster recommends that you continue on your current medications as directed. Please refer to the Current Medication list given to you today.  Your physician has requested that you have an echocardiogram. Echocardiography is a painless test that uses sound waves to create images of your heart. It provides your doctor with information about the size and shape of your heart and how well your heart's chambers and valves are working. This procedure takes approximately one hour. There are no restrictions for this procedure.  Your physician has requested that you have a CT angiogram of the chest.   These tests will be performed at our [facility] location: [address], [address]   Dr [employer] recommends that you schedule a follow-up appointment in 4-6 weeks.  If you need a refill on your cardiac medications before your next appointment, please call your pharmacy.

## 2017-05-27 ENCOUNTER — Ambulatory Visit (INDEPENDENT_AMBULATORY_CARE_PROVIDER_SITE_OTHER)
Admission: RE | Admit: 2017-05-27 | Discharge: 2017-05-27 | Disposition: A | Discharge status: Home / Self Care | Payer: PPO | Source: Ambulatory Visit | Attending: Cardiovascular Disease | Admitting: Cardiovascular Disease

## 2017-05-27 ENCOUNTER — Other Ambulatory Visit: Payer: Self-pay

## 2017-05-27 ENCOUNTER — Ambulatory Visit (HOSPITAL_COMMUNITY): Payer: PPO | Attending: Cardiovascular Disease

## 2017-05-27 DIAGNOSIS — Z8674 Personal history of sudden cardiac arrest: Secondary | ICD-10-CM | POA: Diagnosis not present

## 2017-05-27 DIAGNOSIS — I119 Hypertensive heart disease without heart failure: Secondary | ICD-10-CM | POA: Diagnosis not present

## 2017-05-27 DIAGNOSIS — Z86711 Personal history of pulmonary embolism: Secondary | ICD-10-CM | POA: Insufficient documentation

## 2017-05-27 DIAGNOSIS — R0602 Shortness of breath: Secondary | ICD-10-CM | POA: Insufficient documentation

## 2017-05-27 DIAGNOSIS — E785 Hyperlipidemia, unspecified: Secondary | ICD-10-CM | POA: Insufficient documentation

## 2017-05-27 DIAGNOSIS — I2510 Atherosclerotic heart disease of native coronary artery without angina pectoris: Secondary | ICD-10-CM | POA: Insufficient documentation

## 2017-05-27 DIAGNOSIS — R7989 Other specified abnormal findings of blood chemistry: Secondary | ICD-10-CM | POA: Diagnosis not present

## 2017-05-27 LAB — BASIC METABOLIC PANEL
BUN/Creatinine Ratio: 19 (ref 12–28)
BUN: 15 mg/dL (ref 8–27)
CO2: 27 mmol/L (ref 20–29)
Calcium: 9.2 mg/dL (ref 8.7–10.3)
Chloride: 103 mmol/L (ref 96–106)
Creatinine, Ser: 0.78 mg/dL (ref 0.57–1.00)
GFR calc Af Amer: 85 mL/min/{1.73_m2} (ref >59)
GFR calc non Af Amer: 74 mL/min/{1.73_m2} (ref >59)
Glucose: 87 mg/dL (ref 65–99)
Potassium: 4.3 mmol/L (ref 3.5–5.2)
Sodium: 142 mmol/L (ref 134–144)

## 2017-05-27 MED ORDER — IOPAMIDOL (ISOVUE-370) INJECTION 76%
80.0000 mL | Freq: Once | INTRAVENOUS | Status: AC | PRN
  Administered 2017-05-27: 80 mL via INTRAVENOUS

## 2017-06-21 ENCOUNTER — Other Ambulatory Visit: Payer: Self-pay | Admitting: Family Medicine

## 2017-06-21 DIAGNOSIS — Z139 Encounter for screening, unspecified: Secondary | ICD-10-CM

## 2017-07-05 DIAGNOSIS — Z6830 Body mass index (BMI) 30.0-30.9, adult: Secondary | ICD-10-CM | POA: Diagnosis not present

## 2017-07-05 DIAGNOSIS — D7582 Heparin induced thrombocytopenia (HIT): Secondary | ICD-10-CM | POA: Diagnosis not present

## 2017-07-05 DIAGNOSIS — I2510 Atherosclerotic heart disease of native coronary artery without angina pectoris: Secondary | ICD-10-CM | POA: Diagnosis not present

## 2017-07-05 DIAGNOSIS — Z86711 Personal history of pulmonary embolism: Secondary | ICD-10-CM | POA: Diagnosis not present

## 2017-07-05 DIAGNOSIS — I700 Atherosclerosis of aorta: Secondary | ICD-10-CM | POA: Diagnosis not present

## 2017-07-05 DIAGNOSIS — E890 Postprocedural hypothyroidism: Secondary | ICD-10-CM | POA: Diagnosis not present

## 2017-07-05 DIAGNOSIS — I10 Essential (primary) hypertension: Secondary | ICD-10-CM | POA: Diagnosis not present

## 2017-07-05 DIAGNOSIS — E038 Other specified hypothyroidism: Secondary | ICD-10-CM | POA: Diagnosis not present

## 2017-07-05 DIAGNOSIS — Z1389 Encounter for screening for other disorder: Secondary | ICD-10-CM | POA: Diagnosis not present

## 2017-07-05 DIAGNOSIS — E0500 Thyrotoxicosis with diffuse goiter without thyrotoxic crisis or storm: Secondary | ICD-10-CM | POA: Diagnosis not present

## 2017-07-08 ENCOUNTER — Encounter: Payer: Self-pay | Admitting: Cardiovascular Disease

## 2017-07-08 ENCOUNTER — Ambulatory Visit (INDEPENDENT_AMBULATORY_CARE_PROVIDER_SITE_OTHER): Payer: PPO | Admitting: Cardiovascular Disease

## 2017-07-08 VITALS — BP 140/82 | HR 58 | Ht 63.0 in | Wt 182.8 lb

## 2017-07-08 DIAGNOSIS — R0600 Dyspnea, unspecified: Secondary | ICD-10-CM

## 2017-07-08 DIAGNOSIS — I10 Essential (primary) hypertension: Secondary | ICD-10-CM | POA: Diagnosis not present

## 2017-07-08 DIAGNOSIS — R0609 Other forms of dyspnea: Secondary | ICD-10-CM | POA: Diagnosis not present

## 2017-07-08 DIAGNOSIS — I4589 Other specified conduction disorders: Secondary | ICD-10-CM

## 2017-07-08 DIAGNOSIS — E7800 Pure hypercholesterolemia, unspecified: Secondary | ICD-10-CM

## 2017-07-08 DIAGNOSIS — T466X5A Adverse effect of antihyperlipidemic and antiarteriosclerotic drugs, initial encounter: Secondary | ICD-10-CM

## 2017-07-08 DIAGNOSIS — M791 Myalgia, unspecified site: Secondary | ICD-10-CM

## 2017-07-08 NOTE — Progress Notes (Signed)
.  Cardiology Office Note:    Date:  07/08/2017   ID:  Michele Thompson, DOB 1941-02-15, MRN [id number]  PCP:  Kathyrn Lass, MD  Cardiologist:  No primary care provider on file.   Referring MD: Kathyrn Lass, MD   Chief Complaint  Patient presents with  . Follow-up  . Medication Problem    would like an alternative therapy to atorva, reports severe bilat thigh pain. she has not been taking atorva for ~3 weeks. she has had absolutely no pain in her thigh since holding medication  Exertional dyspnea  History of Present Illness:    Michele Thompson is a 77 y.o. female with a hx of massive pulmonary embolism, complicated by cardiorespiratory arrest, while on estrogen therapy in 2017.  She also has minor coronary artery disease and suspicion for coronary vasospasm on previous angiography, hypertension and hyperlipidemia.  She recently has had complaints of worsening dyspnea.  Chest CT angiogram did not show any evidence of new pulmonary embolism.  Her echocardiogram shows normal left ventricular systolic function.  There is no evidence of elevated filling pressures.  There was some suspicion for chronotropic incompetence, but she has recorded heart rates in the 70s and 80s and 90s with activity.  Pulmonary function tests were normal last year.  She continues to describe NYHA functional class II-3 dyspnea.  She has not had chest pain, palpitations, syncope, leg edema, intermittent claudication, focal neurological complaints or other cardiovascular problems.  She has had problems with bilateral thigh pain for a few months.  She stopped taking her atorvastatin 3 weeks ago and the symptoms have completely resolved.  In 2017 a 48-hour Holter monitor showed persistent mild-moderate sinus bradycardia and blunted circadian rhythm variation.  She had occasional sinus pauses up to 2.3 seconds in duration and rare PACs.  Her beta-blocker was discontinued at that time.  Heart rate today's 58 bpm.   Past Medical  History:  Diagnosis Date  . Coronary artery disease 03/16/2016   a. admx with angina and mildly elevated Troponin >> LHC: mLAD 30 >> CCB started for poss microvascular angina  . Hyperlipidemia 03/16/2016  . Hypertension   . PE (pulmonary thromboembolism) ([place]) 03/17/2016   Bilateral submassive pulmonary embolus with RUE and RLE DVT on venous duplex 02/2016 c/b PEA arrest // Echo 03/21/16: mild LVH, EF 60-65, no RWMA, Gr 1 DD, normal RVSF  . Sinus bradycardia 03/31/2016  . Thrombocytopenia ([place]) 02/2016   Profound thrombocytopenia noted upon admission for pulmonary embolism >> initially thought to be from Hep induced thrombocytopenia given recent admit for cardiac cath and Heparin use // However, HIT Ab was neg (0.184) // Hematology consult pending  . Thyroid disease     Past Surgical History:  Procedure Laterality Date  . CARDIAC CATHETERIZATION N/A 03/15/2016   Procedure: Left Heart Cath and Coronary Angiography;  Surgeon: [name], MD;  Location: [facility];  Service: Cardiovascular;  Laterality: N/A;  . TOTAL KNEE ARTHROPLASTY Right 12/27/2016   Procedure: RIGHT TOTAL KNEE ARTHROPLASTY;  Surgeon: Gaynelle Arabian, MD;  Location: [facility];  Service: Orthopedics;  Laterality: Right;  Marland Kitchen VAGINAL HYSTERECTOMY  1983   endometriosis    Current Medications: Current Meds  Medication Sig  . aspirin EC 81 MG tablet Take 81 mg by mouth daily.  . Cholecalciferol (VITAMIN D3) 2000 units TABS Take 1 tablet by mouth daily.  . cyanocobalamin 100 MCG tablet Take 1,000 mcg by mouth daily.  . diclofenac sodium (VOLTAREN) 1 % GEL Apply  2 g topically daily as needed.  . gabapentin (NEURONTIN) 300 MG capsule Take 1 capsule by mouth at bedtime.  . hydrochlorothiazide (MICROZIDE) 12.5 MG capsule Take 12.5 mg by mouth daily.  Marland Kitchen levothyroxine (SYNTHROID, LEVOTHROID) 100 MCG tablet Take 100 mcg by mouth daily before breakfast.  . lisinopril (PRINIVIL,ZESTRIL) 40 MG tablet TAKE 1 TABLET EACH DAY.  .  Melatonin 5 MG TABS Take 1 tablet by mouth at bedtime.  . polyethylene glycol (MIRALAX / GLYCOLAX) packet Take 17 g by mouth 3 (three) times a week.     Allergies:   Patient has no known allergies.   Social History   Socioeconomic History  . Marital status: Widowed    Spouse name: Not on file  . Number of children: Not on file  . Years of education: Not on file  . Highest education level: Not on file  Occupational History  . Not on file  Social Needs  . Financial resource strain: Not on file  . Food insecurity:    Worry: Not on file    Inability: Not on file  . Transportation needs:    Medical: Not on file    Non-medical: Not on file  Tobacco Use  . Smoking status: Never Smoker  . Smokeless tobacco: Never Used  Substance and Sexual Activity  . Alcohol use: No    Alcohol/week: 0.0 oz  . Drug use: No  . Sexual activity: Never    Birth control/protection: Surgical    Comment: 1st intercourse 77 yo-Fewer than 5 partners  Lifestyle  . Physical activity:    Days per week: Not on file    Minutes per session: Not on file  . Stress: Not on file  Relationships  . Social connections:    Talks on phone: Not on file    Gets together: Not on file    Attends religious service: Not on file    Active member of club or organization: Not on file    Attends meetings of clubs or organizations: Not on file    Relationship status: Not on file  Other Topics Concern  . Not on file  Social History Narrative   [place] [facility]:   Originally from [place]. Moved to [place] in 2012. 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     Family History: The patient's family history includes Heart failure in her father, maternal grandmother, and mother; Myasthenia gravis in her brother; Stroke in her brother. There is no history of Clotting disorder.  ROS:   Please see the history of present illness.     All other systems reviewed and are  negative.  EKGs/Labs/Other Studies Reviewed:    The following studies were reviewed today: Pulmonary embolism protocol CT angiogram of the chest, echocardiogram  EKG:  EKG is ordered today.  Shows normal sinus rhythm, normal tracing  Recent Labs: 12/15/2016: ALT 16 12/29/2016: Hemoglobin 10.2; Platelets 140 05/26/2017: BUN 15; Creatinine, Ser 0.78; Potassium 4.3; Sodium 142  Recent Lipid Panel    Component Value Date/Time   CHOL 142 03/15/2016 0524   TRIG 73 03/15/2016 0524   HDL 44 03/15/2016 0524   CHOLHDL 3.2 03/15/2016 0524   VLDL 15 03/15/2016 0524   LDLCALC 83 03/15/2016 0524    Physical Exam:    VS:  BP 140/82   Pulse (!) 58   Ht 5\' 3"  (1.6 m)   Wt 182 lb 12.8 oz (82.9 kg)   BMI 32.38 kg/m  Wt Readings from Last 3 Encounters:  07/08/17 182 lb 12.8 oz (82.9 kg)  05/26/17 182 lb 6.4 oz (82.7 kg)  05/19/17 180 lb 6.4 oz (81.8 kg)     General: Alert, oriented x3, no distress, mildly obese Head: no evidence of trauma, PERRL, EOMI, no exophtalmos or lid lag, no myxedema, no xanthelasma; normal ears, nose and oropharynx Neck: normal jugular venous pulsations and no hepatojugular reflux; brisk carotid pulses without delay and no carotid bruits Chest: clear to auscultation, no signs of consolidation by percussion or palpation, normal fremitus, symmetrical and full respiratory excursions Cardiovascular: normal position and quality of the apical impulse, regular rhythm, normal first and second heart sounds, no murmurs, rubs or gallops Abdomen: no tenderness or distention, no masses by palpation, no abnormal pulsatility or arterial bruits, normal bowel sounds, no hepatosplenomegaly Extremities: no clubbing, cyanosis or edema; 2+ radial, ulnar and brachial pulses bilaterally; 2+ right femoral, posterior tibial and dorsalis pedis pulses; 2+ left femoral, posterior tibial and dorsalis pedis pulses; no subclavian or femoral bruits Neurological: grossly nonfocal Psych: Normal mood  and affect   ASSESSMENT:    1. Exertional dyspnea   2. Chronotropic incompetence   3. Essential hypertension   4. Hypercholesterolemia   5. Myalgia due to atorvastatin    PLAN:    In order of problems listed above:  1. Dyspnea: No clear explanation.  Part of it is simply obesity and deconditioning.  There is no evidence of left ventricular systolic or diastolic dysfunction or major valve abnormalities.  I think it safe for her to start exercising more frequently trying to gradually build up her stamina.  Weight loss would be beneficial. 2. SSS/chronotropic incompetence: Questionable diagnosis, since she has seen her heart rate frequently reached the 80s and 90s just doing regular activity.  Last year when she wore her Holter monitor she was on a beta-blocker which has since been stopped.  I offered formal evaluation with a treadmill stress test, she said she was simply do that with her own equipment at the [facility] gym.  I told her that if her heart rate can increase to over 100 bpm is highly unlikely that she would benefit from pacemaker implantation. 3. HTN: Usually very well controlled, recently had a doctor's appointment it was 100/67.  No change in medications. 4. HLP: It appears that she has statin related myalgia.  This has resolved after 3 weeks of "statin holiday.  She had relatively minor coronary atherosclerosis at cardiac catheterization in the recent past.  Target LDL preferably under 70.  We will try to resume the same statin with coenzyme Q 10 supplementation, 300 mg daily.  If this does not work I would try switching to rosuvastatin 10 mg once daily.  If that is also associated with myalgia, will sit down and talk to her about the cost benefit balance for a PCSK9 inhibitor.     Medication Adjustments/Labs and Tests Ordered: Current medicines are reviewed at length with the patient today.  Concerns regarding medicines are outlined above.  No orders of the defined types  were placed in this encounter.  No orders of the defined types were placed in this encounter.   Signed, Sanda Klein, MD  07/08/2017 2:45 PM    [facility]

## 2017-07-08 NOTE — Patient Instructions (Signed)
Dr Sallyanne Kuster has recommended making the following medication changes: 1. START over-the-counter CO Q10 300 mg daily  Your physician recommends that you schedule a follow-up appointment in 6 months.  If you need a refill on your cardiac medications before your next appointment, please call your pharmacy.    Endocrinologists in [place]:  - Dr Philemon Kingdom at [facility] [address] [place] [address] [phone]  - Dr Jacelyn Pi at [facility] [address], [place] [address] [phone]

## 2017-07-11 ENCOUNTER — Encounter: Payer: Self-pay | Admitting: Gastroenterology

## 2017-07-11 ENCOUNTER — Ambulatory Visit
Admission: RE | Admit: 2017-07-11 | Discharge: 2017-07-11 | Disposition: A | Discharge status: Home / Self Care | Payer: PPO | Source: Ambulatory Visit | Attending: Family Medicine | Admitting: Family Medicine

## 2017-07-11 DIAGNOSIS — Z1231 Encounter for screening mammogram for malignant neoplasm of breast: Secondary | ICD-10-CM | POA: Diagnosis not present

## 2017-07-11 DIAGNOSIS — Z139 Encounter for screening, unspecified: Secondary | ICD-10-CM

## 2017-07-21 ENCOUNTER — Other Ambulatory Visit: Payer: Self-pay | Admitting: Cardiology

## 2017-08-30 ENCOUNTER — Ambulatory Visit (INDEPENDENT_AMBULATORY_CARE_PROVIDER_SITE_OTHER): Payer: PPO | Admitting: Gastroenterology

## 2017-08-30 ENCOUNTER — Encounter: Payer: Self-pay | Admitting: Gastroenterology

## 2017-08-30 VITALS — BP 134/66 | HR 68 | Ht 63.0 in | Wt 185.8 lb

## 2017-08-30 DIAGNOSIS — Z1211 Encounter for screening for malignant neoplasm of colon: Secondary | ICD-10-CM | POA: Diagnosis not present

## 2017-08-30 MED ORDER — ONDANSETRON HCL 4 MG PO TABS: 4.0000 mg | ORAL_TABLET | Freq: Three times a day (TID) | ORAL | 0 refills | Status: DC | PRN

## 2017-08-30 MED ORDER — PEG-KCL-NACL-NASULF-NA ASC-C 140 G PO SOLR: 1.0000 | Freq: Once | ORAL | 0 refills | Status: AC

## 2017-08-30 NOTE — Patient Instructions (Signed)
You have been scheduled for a colonoscopy. Please follow written instructions given to you at your visit today.  Please pick up your prep supplies at the pharmacy within the next 1-3 days. If you use inhalers (even only as needed), please bring them with you on the day of your procedure. Your physician has requested that you go to www.startemmi.com and enter the access code given to you at your visit today. This web site gives a general overview about your procedure. However, you should still follow specific instructions given to you by our office regarding your preparation for the procedure.  If you are age 31 or older, your body mass index should be between 23-30. Your Body mass index is 32.91 kg/m. If this is out of the aforementioned range listed, please consider follow up with your Primary Care Provider.  If you are age 45 or younger, your body mass index should be between 19-25. Your Body mass index is 32.91 kg/m. If this is out of the aformentioned range listed, please consider follow up with your Primary Care Provider.

## 2017-08-30 NOTE — Progress Notes (Signed)
Michele Thompson    [id number]    01/31/1941  Primary Care Physician:Sparano, Lattie Haw, MD  Referring Physician: Kathyrn Lass, MD [place], [place] [id number]  Chief complaint: Colorectal cancer screening  HPI: 77 year old female here for new patient visit to discuss colorectal cancer screening.  Last colonoscopy in [place] in 2007, had sigmoid diverticulosis, ?  AVM, small nonbleeding internal hemorrhoids.  No polyps were removed.  She has intermittent constipation, symptoms improved with daily MiraLAX and currently has regular bowel movements. DVT/PE in Nov 2017 s/p anticoagulation 10 months.  Hypercoagulable state was though to be secondary to hormone replacement therapy.  She had knee replacement surgery Sep 2018. Denies any nausea, vomiting, abdominal pain, melena or bright red blood per rectum No family history of colon cancer.   Outpatient Encounter Medications as of 08/30/2017  Medication Sig  . aspirin EC 81 MG tablet Take 81 mg by mouth daily.  Marland Kitchen atorvastatin (LIPITOR) 40 MG tablet Take 1 tablet (40 mg total) by mouth daily at 6 PM.  . Cholecalciferol (VITAMIN D3) 2000 units TABS Take 1 tablet by mouth daily.  . diclofenac sodium (VOLTAREN) 1 % GEL Apply 2 g topically daily as needed.  . gabapentin (NEURONTIN) 300 MG capsule Take 1 capsule by mouth at bedtime.  . hydrochlorothiazide (MICROZIDE) 12.5 MG capsule Take 12.5 mg by mouth daily.  Marland Kitchen levothyroxine (SYNTHROID, LEVOTHROID) 100 MCG tablet Take 100 mcg by mouth daily before breakfast.  . lisinopril (PRINIVIL,ZESTRIL) 40 MG tablet TAKE 1 TABLET EACH DAY.  . Melatonin 5 MG TABS Take 1 tablet by mouth at bedtime.  . polyethylene glycol (MIRALAX / GLYCOLAX) packet Take 17 g by mouth 4 (four) times a week.   . [DISCONTINUED] cyanocobalamin 100 MCG tablet Take 1,000 mcg by mouth daily.   No facility-administered encounter medications on file as of 08/30/2017.     Allergies as of 08/30/2017  . (No Known  Allergies)    Past Medical History:  Diagnosis Date  . Arthritis   . Coronary artery disease 03/16/2016   a. admx with angina and mildly elevated Troponin >> LHC: mLAD 30 >> CCB started for poss microvascular angina  . Diverticulosis   . Hyperlipidemia 03/16/2016  . Hypertension   . PE (pulmonary thromboembolism) ([facility]) 03/17/2016   Bilateral submassive pulmonary embolus with RUE and RLE DVT on venous duplex 02/2016 c/b PEA arrest // Echo 03/21/16: mild LVH, EF 60-65, no RWMA, Gr 1 DD, normal RVSF  . Sinus bradycardia 03/31/2016  . Thrombocytopenia ([facility]) 02/2016   Profound thrombocytopenia noted upon admission for pulmonary embolism >> initially thought to be from Hep induced thrombocytopenia given recent admit for cardiac cath and Heparin use // However, HIT Ab was neg (0.184) // Hematology consult pending  . Thyroid disease   . Vascular malformation    mid ascending colon    Past Surgical History:  Procedure Laterality Date  . CARDIAC CATHETERIZATION N/A 03/15/2016   Procedure: Left Heart Cath and Coronary Angiography;  Surgeon: [name], MD;  Location: [facility];  Service: Cardiovascular;  Laterality: N/A;  . TOTAL KNEE ARTHROPLASTY Right 12/27/2016   Procedure: RIGHT TOTAL KNEE ARTHROPLASTY;  Surgeon: Gaynelle Arabian, MD;  Location: [facility];  Service: Orthopedics;  Laterality: Right;  Marland Kitchen VAGINAL HYSTERECTOMY  1983   endometriosis    Family History  Problem Relation Age of Onset  . Heart failure Mother   . Heart failure Father   .  Heart failure Maternal Grandmother   . Myasthenia gravis Brother   . Stroke Brother   . Clotting disorder Neg Hx   . Colon cancer Neg Hx     Social History   Socioeconomic History  . Marital status: Widowed    Spouse name: Not on file  . Number of children: Not on file  . Years of education: Not on file  . Highest education level: Not on file  Occupational History  . Not on file  Social Needs  . Financial resource strain: Not  on file  . Food insecurity:    Worry: Not on file    Inability: Not on file  . Transportation needs:    Medical: Not on file    Non-medical: Not on file  Tobacco Use  . Smoking status: Never Smoker  . Smokeless tobacco: Never Used  Substance and Sexual Activity  . Alcohol use: No    Alcohol/week: 0.0 oz  . Drug use: No  . Sexual activity: Never    Birth control/protection: Surgical    Comment: 1st intercourse 77 yo-Fewer than 5 partners  Lifestyle  . Physical activity:    Days per week: Not on file    Minutes per session: Not on file  . Stress: Not on file  Relationships  . Social connections:    Talks on phone: Not on file    Gets together: Not on file    Attends religious service: Not on file    Active member of club or organization: Not on file    Attends meetings of clubs or organizations: Not on file    Relationship status: Not on file  . Intimate partner violence:    Fear of current or ex partner: Not on file    Emotionally abused: Not on file    Physically abused: Not on file    Forced sexual activity: Not on file  Other Topics Concern  . Not on file  Social History Narrative   [facility]:   Originally from [place]. Moved to [place] in 2012. 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      Review of systems: Review of Systems  Constitutional: Negative for fever and chills.  HENT: Positive for hearing problems Eyes: Negative for blurred vision.  Respiratory: Negative for cough, shortness of breath and wheezing.   Cardiovascular: Negative for chest pain and palpitations.  Gastrointestinal: as per HPI Genitourinary: Negative for dysuria, urgency, frequency and hematuria.  Musculoskeletal: Negative for myalgias, back pain and positive for joint pain.  Skin: Negative for itching and rash.  Neurological: Negative for dizziness, tremors, focal weakness, seizures and loss of consciousness.    Endo/Heme/Allergies: Positive for seasonal allergies.  Psychiatric/Behavioral: Negative for depression, suicidal ideas and hallucinations.  Positive for sleeping problems All other systems reviewed and are negative.   Physical Exam: Vitals:   08/30/17 1417  BP: 134/66  Pulse: 68   Body mass index is 32.91 kg/m. Gen:      No acute distress HEENT:  EOMI, sclera anicteric Neck:     No masses; no thyromegaly Lungs:    Clear to auscultation bilaterally; normal respiratory effort CV:         Regular rate and rhythm; no murmurs Abd:      + bowel sounds; soft, non-tender; no palpable masses, no distension Ext:    No edema; adequate peripheral perfusion Skin:      Warm and dry; no rash Neuro: alert and  oriented x 3 Psych: normal mood and affect  Data Reviewed:  Reviewed labs, radiology imaging, old records and pertinent past GI work up   Assessment and Plan/Recommendations:  77 year old female with history of DVT/PE hormone replacement therapy status post anticoagulation, knee replacement surgery here to discuss colorectal cancer screening. She is currently not on any anticoagulants, only takes aspirin 81 mg daily Last colonoscopy in 2007, patient is past due for screening colonoscopy We will schedule for colonoscopy The risks and benefits as well as alternatives of endoscopic procedure(s) have been discussed and reviewed. All questions answered. The patient agrees to proceed.     Greater than 50% of the time used for counseling as well as treatment plan and follow-up. She had multiple questions which were answered to her satisfaction  K. Denzil Magnuson , MD [phone]    CC: Kathyrn Lass, MD

## 2017-08-31 ENCOUNTER — Encounter: Payer: Self-pay | Admitting: Gastroenterology

## 2017-09-15 DIAGNOSIS — H524 Presbyopia: Secondary | ICD-10-CM | POA: Diagnosis not present

## 2017-09-15 DIAGNOSIS — Z961 Presence of intraocular lens: Secondary | ICD-10-CM | POA: Diagnosis not present

## 2017-09-15 DIAGNOSIS — H52202 Unspecified astigmatism, left eye: Secondary | ICD-10-CM | POA: Diagnosis not present

## 2017-09-15 DIAGNOSIS — H5201 Hypermetropia, right eye: Secondary | ICD-10-CM | POA: Diagnosis not present

## 2017-09-15 DIAGNOSIS — H40013 Open angle with borderline findings, low risk, bilateral: Secondary | ICD-10-CM | POA: Diagnosis not present

## 2017-09-15 DIAGNOSIS — H5212 Myopia, left eye: Secondary | ICD-10-CM | POA: Diagnosis not present

## 2017-09-16 DIAGNOSIS — I700 Atherosclerosis of aorta: Secondary | ICD-10-CM | POA: Diagnosis not present

## 2017-09-16 DIAGNOSIS — Z6832 Body mass index (BMI) 32.0-32.9, adult: Secondary | ICD-10-CM | POA: Diagnosis not present

## 2017-09-16 DIAGNOSIS — M7551 Bursitis of right shoulder: Secondary | ICD-10-CM | POA: Diagnosis not present

## 2017-09-20 ENCOUNTER — Telehealth: Payer: Self-pay | Admitting: Gastroenterology

## 2017-09-20 NOTE — Telephone Encounter (Signed)
Called patient back to inform her that I have a sample prep kit here for her to pick up  [place]

## 2017-09-21 ENCOUNTER — Other Ambulatory Visit: Payer: PPO

## 2017-09-26 ENCOUNTER — Encounter: Payer: Self-pay | Admitting: Gastroenterology

## 2017-10-04 ENCOUNTER — Other Ambulatory Visit: Payer: Self-pay

## 2017-10-04 ENCOUNTER — Ambulatory Visit (AMBULATORY_SURGERY_CENTER): Payer: PPO | Admitting: Gastroenterology

## 2017-10-04 ENCOUNTER — Encounter: Payer: Self-pay | Admitting: Gastroenterology

## 2017-10-04 VITALS — BP 108/45 | HR 53 | Temp 98.6°F | Resp 12 | Ht 63.0 in | Wt 185.0 lb

## 2017-10-04 DIAGNOSIS — Z1211 Encounter for screening for malignant neoplasm of colon: Secondary | ICD-10-CM | POA: Diagnosis not present

## 2017-10-04 DIAGNOSIS — K635 Polyp of colon: Secondary | ICD-10-CM

## 2017-10-04 DIAGNOSIS — D124 Benign neoplasm of descending colon: Secondary | ICD-10-CM

## 2017-10-04 DIAGNOSIS — I2510 Atherosclerotic heart disease of native coronary artery without angina pectoris: Secondary | ICD-10-CM | POA: Diagnosis not present

## 2017-10-04 DIAGNOSIS — I10 Essential (primary) hypertension: Secondary | ICD-10-CM | POA: Diagnosis not present

## 2017-10-04 MED ORDER — SODIUM CHLORIDE 0.9 % IV SOLN: 500.0000 mL | Freq: Once | INTRAVENOUS | Status: DC

## 2017-10-04 NOTE — Progress Notes (Signed)
Called to room to assist during endoscopic procedure.  Patient ID and intended procedure confirmed with present staff. Received instructions for my participation in the procedure from the performing physician.  

## 2017-10-04 NOTE — Progress Notes (Signed)
Pt's states no medical or surgical changes since previsit or office visit. 

## 2017-10-04 NOTE — Patient Instructions (Signed)
Discharge instructions given. Handouts on polyps,diverticulosis and hemorrhoids. Resume previous medications. YOU HAD AN ENDOSCOPIC PROCEDURE TODAY AT THE [facility]:   Refer to the procedure report that was given to you for any specific questions about what was found during the examination.  If the procedure report does not answer your questions, please call your gastroenterologist to clarify.  If you requested that your care partner not be given the details of your procedure findings, then the procedure report has been included in a sealed envelope for you to review at your convenience later.  YOU SHOULD EXPECT: Some feelings of bloating in the abdomen. Passage of more gas than usual.  Walking can help get rid of the air that was put into your GI tract during the procedure and reduce the bloating. If you had a lower endoscopy (such as a colonoscopy or flexible sigmoidoscopy) you may notice spotting of blood in your stool or on the toilet paper. If you underwent a bowel prep for your procedure, you may not have a normal bowel movement for a few days.  Please Note:  You might notice some irritation and congestion in your nose or some drainage.  This is from the oxygen used during your procedure.  There is no need for concern and it should clear up in a day or so.  SYMPTOMS TO REPORT IMMEDIATELY:   Following lower endoscopy (colonoscopy or flexible sigmoidoscopy):  Excessive amounts of blood in the stool  Significant tenderness or worsening of abdominal pains  Swelling of the abdomen that is new, acute  Fever of 100F or higher   For urgent or emergent issues, a gastroenterologist can be reached at any hour by calling [phone].   DIET:  We do recommend a small meal at first, but then you may proceed to your regular diet.  Drink plenty of fluids but you should avoid alcoholic beverages for 24 hours.  ACTIVITY:  You should plan to take it easy for the rest of today and you  should NOT DRIVE or use heavy machinery until tomorrow (because of the sedation medicines used during the test).    FOLLOW UP: Our staff will call the number listed on your records the next business day following your procedure to check on you and address any questions or concerns that you may have regarding the information given to you following your procedure. If we do not reach you, we will leave a message.  However, if you are feeling well and you are not experiencing any problems, there is no need to return our call.  We will assume that you have returned to your regular daily activities without incident.  If any biopsies were taken you will be contacted by phone or by letter within the next 1-3 weeks.  Please call us at [phone] if you have not heard about the biopsies in 3 weeks.    SIGNATURES/CONFIDENTIALITY: You and/or your care partner have signed paperwork which will be entered into your electronic medical record.  These signatures attest to the fact that that the information above on your After Visit Summary has been reviewed and is understood.  Full responsibility of the confidentiality of this discharge information lies with you and/or your care-partner. 

## 2017-10-04 NOTE — Op Note (Signed)
[place] Patient Name: Michele Thompson Procedure Date: 10/04/2017 8:30 AM MRN: [id number] Endoscopist: Mauri Pole , MD Age: 77 Referring MD:  Date of Birth: 1940/06/25 Gender: Female Account #: [id number] Procedure:                Colonoscopy Indications:              Screening for colorectal malignant neoplasm Medicines:                Monitored Anesthesia Care Procedure:                Pre-Anesthesia Assessment:                           - Prior to the procedure, a History and Physical                            was performed, and patient medications and                            allergies were reviewed. The patient's tolerance of                            previous anesthesia was also reviewed. The risks                            and benefits of the procedure and the sedation                            options and risks were discussed with the patient.                            All questions were answered, and informed consent                            was obtained. Prior Anticoagulants: The patient has                            taken no previous anticoagulant or antiplatelet                            agents. ASA Grade Assessment: II - A patient with                            mild systemic disease. After reviewing the risks                            and benefits, the patient was deemed in                            satisfactory condition to undergo the procedure.                           After obtaining informed consent, the colonoscope  was passed under direct vision. Throughout the                            procedure, the patient's blood pressure, pulse, and                            oxygen saturations were monitored continuously. The                            Colonoscope was introduced through the anus and                            advanced to the the cecum, identified by                            appendiceal orifice and  ileocecal valve. The                            colonoscopy was performed without difficulty. The                            patient tolerated the procedure well. The quality                            of the bowel preparation was excellent. The                            ileocecal valve, appendiceal orifice, and rectum                            were photographed. Scope In: 8:40:20 AM Scope Out: 8:52:03 AM Scope Withdrawal Time: 0 hours 7 minutes 27 seconds  Total Procedure Duration: 0 hours 11 minutes 43 seconds  Findings:                 The perianal and digital rectal examinations were                            normal.                           Two sessile polyps were found in the descending                            colon. The polyps were 4 to 6 mm in size. These                            polyps were removed with a cold snare. Resection                            and retrieval were complete.                           Multiple small and large-mouthed diverticula were  found in the sigmoid colon, descending colon,                            transverse colon and ascending colon.                           Non-bleeding internal hemorrhoids were found during                            retroflexion. The hemorrhoids were small. Complications:            No immediate complications. Estimated Blood Loss:     Estimated blood loss was minimal. Impression:               - Two 4 to 6 mm polyps in the descending colon,                            removed with a cold snare. Resected and retrieved.                           - Severe diverticulosis in the sigmoid colon, in                            the descending colon, in the transverse colon and                            in the ascending colon.                           - Non-bleeding internal hemorrhoids. Recommendation:           - Patient has a contact number available for                            emergencies.  The signs and symptoms of potential                            delayed complications were discussed with the                            patient. Return to normal activities tomorrow.                            Written discharge instructions were provided to the                            patient.                           - Resume previous diet.                           - Continue present medications.                           - Await pathology results.                           -  No repeat colonoscopy due to age. Mauri Pole, MD 10/04/2017 9:01:57 AM This report has been signed electronically.

## 2017-10-04 NOTE — Progress Notes (Signed)
Report to PACU, RN, vss, BBS= Clear.  

## 2017-10-05 ENCOUNTER — Telehealth: Payer: Self-pay | Admitting: *Deleted

## 2017-10-05 NOTE — Telephone Encounter (Signed)
  Follow up Call-  Call back number 10/04/2017  Post procedure Call Back phone  # [phone]  Permission to leave phone message Yes  Some recent data might be hidden     Patient questions:  Do you have a fever, pain , or abdominal swelling? No. Pain Score  0 *  Have you tolerated food without any problems? Yes.    Have you been able to return to your normal activities? Yes.    Do you have any questions about your discharge instructions: Diet   No. Medications  No. Follow up visit  No.  Do you have questions or concerns about your Care? No.  Actions: * If pain score is 4 or above: No action needed, pain <4.

## 2017-10-08 ENCOUNTER — Encounter: Payer: Self-pay | Admitting: Gastroenterology

## 2017-10-13 DIAGNOSIS — Z96651 Presence of right artificial knee joint: Secondary | ICD-10-CM | POA: Diagnosis not present

## 2017-10-13 DIAGNOSIS — M1712 Unilateral primary osteoarthritis, left knee: Secondary | ICD-10-CM | POA: Diagnosis not present

## 2017-10-13 DIAGNOSIS — M25562 Pain in left knee: Secondary | ICD-10-CM | POA: Diagnosis not present

## 2017-11-04 ENCOUNTER — Telehealth: Payer: Self-pay | Admitting: Acute Care

## 2017-11-04 NOTE — Telephone Encounter (Signed)
Called [facility].  Delana Meyer was on the phone, but Caryl Pina took the patient information and the PFT codes.

## 2017-11-04 NOTE — Telephone Encounter (Signed)
Called and spoke with [facility], she states that the patient is wanting to know how much it will cost her to have the PFT done. Jasmine advised that she will need to know how it is coded. Will route to Kathlee Nations to follow up on. She asked that you request to speak to [facility].

## 2017-11-24 ENCOUNTER — Other Ambulatory Visit: Payer: Self-pay

## 2017-11-24 ENCOUNTER — Emergency Department (HOSPITAL_COMMUNITY)
Admission: EM | Admit: 2017-11-24 | Discharge: 2017-11-24 | Disposition: A | Discharge status: Home / Self Care | Payer: PPO | Attending: Emergency Medicine | Admitting: Emergency Medicine

## 2017-11-24 ENCOUNTER — Encounter (HOSPITAL_COMMUNITY): Payer: Self-pay | Admitting: Emergency Medicine

## 2017-11-24 ENCOUNTER — Emergency Department (HOSPITAL_COMMUNITY): Payer: PPO

## 2017-11-24 DIAGNOSIS — R0789 Other chest pain: Secondary | ICD-10-CM | POA: Diagnosis not present

## 2017-11-24 DIAGNOSIS — R000 Tachycardia, unspecified: Secondary | ICD-10-CM | POA: Diagnosis not present

## 2017-11-24 DIAGNOSIS — Z79899 Other long term (current) drug therapy: Secondary | ICD-10-CM | POA: Insufficient documentation

## 2017-11-24 DIAGNOSIS — E785 Hyperlipidemia, unspecified: Secondary | ICD-10-CM | POA: Diagnosis not present

## 2017-11-24 DIAGNOSIS — Z86718 Personal history of other venous thrombosis and embolism: Secondary | ICD-10-CM | POA: Diagnosis not present

## 2017-11-24 DIAGNOSIS — E7800 Pure hypercholesterolemia, unspecified: Secondary | ICD-10-CM | POA: Insufficient documentation

## 2017-11-24 DIAGNOSIS — I10 Essential (primary) hypertension: Secondary | ICD-10-CM | POA: Insufficient documentation

## 2017-11-24 DIAGNOSIS — E039 Hypothyroidism, unspecified: Secondary | ICD-10-CM | POA: Diagnosis not present

## 2017-11-24 DIAGNOSIS — I2510 Atherosclerotic heart disease of native coronary artery without angina pectoris: Secondary | ICD-10-CM | POA: Diagnosis not present

## 2017-11-24 DIAGNOSIS — R0902 Hypoxemia: Secondary | ICD-10-CM | POA: Diagnosis not present

## 2017-11-24 DIAGNOSIS — R079 Chest pain, unspecified: Secondary | ICD-10-CM | POA: Diagnosis not present

## 2017-11-24 DIAGNOSIS — Z7982 Long term (current) use of aspirin: Secondary | ICD-10-CM | POA: Diagnosis not present

## 2017-11-24 DIAGNOSIS — I4891 Unspecified atrial fibrillation: Secondary | ICD-10-CM | POA: Diagnosis not present

## 2017-11-24 DIAGNOSIS — S0990XA Unspecified injury of head, initial encounter: Secondary | ICD-10-CM | POA: Diagnosis not present

## 2017-11-24 LAB — CBC
HCT: 39.4 % (ref 36.0–46.0)
Hemoglobin: 12.8 g/dL (ref 12.0–15.0)
MCH: 31.2 pg (ref 26.0–34.0)
MCHC: 32.5 g/dL (ref 30.0–36.0)
MCV: 96.1 fL (ref 78.0–100.0)
Platelets: 175 10*3/uL (ref 150–400)
RBC: 4.1 MIL/uL (ref 3.87–5.11)
RDW: 12.4 % (ref 11.5–15.5)
WBC: 6.4 10*3/uL (ref 4.0–10.5)

## 2017-11-24 LAB — I-STAT TROPONIN, ED
Troponin i, poc: 0.01 ng/mL (ref 0.00–0.08)
Troponin i, poc: 0.01 ng/mL (ref 0.00–0.08)

## 2017-11-24 LAB — COMPREHENSIVE METABOLIC PANEL
ALT: 12 U/L (ref 0–44)
AST: 35 U/L (ref 15–41)
Albumin: 3.6 g/dL (ref 3.5–5.0)
Alkaline Phosphatase: 77 U/L (ref 38–126)
Anion gap: 11 (ref 5–15)
BUN: 14 mg/dL (ref 8–23)
CO2: 28 mmol/L (ref 22–32)
Calcium: 9.2 mg/dL (ref 8.9–10.3)
Chloride: 101 mmol/L (ref 98–111)
Creatinine, Ser: 0.86 mg/dL (ref 0.44–1.00)
GFR calc Af Amer: >60 mL/min (ref >60)
GFR calc non Af Amer: >60 mL/min (ref >60)
Glucose, Bld: 113 mg/dL — ABNORMAL HIGH (ref 70–99)
Potassium: 4.9 mmol/L (ref 3.5–5.1)
Sodium: 140 mmol/L (ref 135–145)
Total Bilirubin: 1.1 mg/dL (ref 0.3–1.2)
Total Protein: 6.3 g/dL — ABNORMAL LOW (ref 6.5–8.1)

## 2017-11-24 LAB — PROTIME-INR
INR: 0.9
Prothrombin Time: 12.1 seconds (ref 11.4–15.2)

## 2017-11-24 LAB — MAGNESIUM: Magnesium: 2.3 mg/dL (ref 1.7–2.4)

## 2017-11-24 MED ORDER — DILTIAZEM LOAD VIA INFUSION
10.0000 mg | Freq: Once | INTRAVENOUS | Status: AC
Start: 2017-11-24 — End: 2017-11-24
  Administered 2017-11-24: 10 mg via INTRAVENOUS
  Filled 2017-11-24: qty 10

## 2017-11-24 MED ORDER — APIXABAN 5 MG PO TABS
5.0000 mg | ORAL_TABLET | Freq: Once | ORAL | Status: AC
  Administered 2017-11-24: 5 mg via ORAL
  Filled 2017-11-24: qty 1

## 2017-11-24 MED ORDER — PROPOFOL 10 MG/ML IV BOLUS
INTRAVENOUS | Status: AC | PRN
  Administered 2017-11-24: 40 mg via INTRAVENOUS

## 2017-11-24 MED ORDER — APIXABAN 5 MG PO TABS: 5.0000 mg | ORAL_TABLET | Freq: Two times a day (BID) | ORAL | 1 refills | Status: DC

## 2017-11-24 MED ORDER — DILTIAZEM HCL-DEXTROSE 100-5 MG/100ML-% IV SOLN (PREMIX)
5.0000 mg/h | INTRAVENOUS | Status: DC
  Administered 2017-11-24: 5 mg/h via INTRAVENOUS
  Filled 2017-11-24: qty 100

## 2017-11-24 MED ORDER — PROPOFOL 10 MG/ML IV BOLUS
0.5000 mg/kg | Freq: Once | INTRAVENOUS | Status: DC
  Filled 2017-11-24: qty 20

## 2017-11-24 NOTE — ED Notes (Signed)
Patient transported to X-ray 

## 2017-11-24 NOTE — ED Triage Notes (Signed)
BIB [facility] from home with c/o of chest pressure that began @0100 . Pt reports it radiates into her throat. Received 10mg  Metoprolol in route.

## 2017-11-24 NOTE — ED Provider Notes (Signed)
[facility] EMERGENCY DEPARTMENT Provider Note   CSN: [id number] Arrival date & time: 11/24/17  0151     History   Chief Complaint Chief Complaint  Patient presents with  . Chest Pain    HPI Michele Thompson is a 77 y.o. female.  77 year old female with a history of hypertension, dyslipidemia, pulmonary embolus (off chronic anticoagulation since August 2018), CAD, thyroid disease presents to the emergency department for evaluation of chest pressure.  Pressure began at 1 AM, waking the patient from sleep.  She states that discomfort radiated towards her neck and throat.  Patient found to be in atrial fibrillation with RVR on EMS arrival.  Pain has improved slightly with 10 mg metoprolol in route given by EMS.  Patient denies any history of atrial fibrillation.  She has not had any shortness of breath, nausea, vomiting, syncope or near syncope, abdominal pain, leg swelling, recent fevers.  Patient has DNR order.     Past Medical History:  Diagnosis Date  . Arthritis   . Coronary artery disease 03/16/2016   a. admx with angina and mildly elevated Troponin >> LHC: mLAD 30 >> CCB started for poss microvascular angina  . Diverticulosis   . Hyperlipidemia 03/16/2016  . Hypertension   . PE (pulmonary thromboembolism) ([place]) 03/17/2016   Bilateral submassive pulmonary embolus with RUE and RLE DVT on venous duplex 02/2016 c/b PEA arrest // Echo 03/21/16: mild LVH, EF 60-65, no RWMA, Gr 1 DD, normal RVSF  . Sinus bradycardia 03/31/2016  . Thrombocytopenia ([place]) 02/2016   Profound thrombocytopenia noted upon admission for pulmonary embolism >> initially thought to be from Hep induced thrombocytopenia given recent admit for cardiac cath and Heparin use // However, HIT Ab was neg (0.184) // Hematology consult pending  . Thyroid disease   . Vascular malformation    mid ascending colon    Patient Active Problem List   Diagnosis Date Noted  . Chronotropic incompetence with  sinus node dysfunction (HCC) 05/26/2017  . OA (osteoarthritis) of knee 12/27/2016  . Pre-operative cardiovascular examination 10/26/2016  . Chronic anticoagulation 10/26/2016  . History of DVT (deep vein thrombosis) 04/23/2016  . Coronary artery disease involving native heart without angina pectoris 03/31/2016  . Sinus bradycardia 03/31/2016  . NSTEMI (non-ST elevated myocardial infarction) ([place])   . PE (pulmonary thromboembolism) ([place]) 03/17/2016  . Hypothyroidism 03/17/2016  . Rectal bleeding 03/17/2016  . Headache 03/17/2016  . Hypercholesterolemia 03/16/2016  . Hypertension 03/16/2016    Past Surgical History:  Procedure Laterality Date  . CARDIAC CATHETERIZATION N/A 03/15/2016   Procedure: Left Heart Cath and Coronary Angiography;  Surgeon: [name], MD;  Location: [facility];  Service: Cardiovascular;  Laterality: N/A;  . TOTAL KNEE ARTHROPLASTY Right 12/27/2016   Procedure: RIGHT TOTAL KNEE ARTHROPLASTY;  Surgeon: Gaynelle Arabian, MD;  Location: [facility];  Service: Orthopedics;  Laterality: Right;  Marland Kitchen VAGINAL HYSTERECTOMY  1983   endometriosis     OB History    Gravida  2   Para  2   Term  2   Preterm      AB      Living  2     SAB      TAB      Ectopic      Multiple      Live Births               Home Medications    Prior to Admission medications   Medication Sig Start  Date End Date Taking? Authorizing Provider  aspirin EC 81 MG tablet Take 81 mg by mouth daily.    [provider]  atorvastatin (LIPITOR) 40 MG tablet Take 1 tablet (40 mg total) by mouth daily at 6 PM. 10/26/16   Kilroy, Doreene Burke, PA-C  Cholecalciferol (VITAMIN D3) 2000 units TABS Take 1 tablet by mouth daily.    [provider]  diclofenac sodium (VOLTAREN) 1 % GEL Apply 2 g topically daily as needed.    [provider]  gabapentin (NEURONTIN) 300 MG capsule Take 1 capsule by mouth at bedtime. 06/20/17   [provider]    hydrochlorothiazide (MICROZIDE) 12.5 MG capsule Take 12.5 mg by mouth daily.    [provider]  levothyroxine (SYNTHROID, LEVOTHROID) 100 MCG tablet Take 100 mcg by mouth daily before breakfast.    [provider]  lisinopril (PRINIVIL,ZESTRIL) 40 MG tablet TAKE 1 TABLET EACH DAY. 07/21/17   Croitoru, Mihai, MD  Melatonin 5 MG TABS Take 1 tablet by mouth at bedtime.    [provider]  ondansetron (ZOFRAN) 4 MG tablet Take 1 tablet (4 mg total) by mouth every 8 (eight) hours as needed for nausea or vomiting. 08/30/17   Nandigam, Venia Minks, MD  polyethylene glycol (MIRALAX / GLYCOLAX) packet Take 17 g by mouth 4 (four) times a week.     [provider]    Family History Family History  Problem Relation Age of Onset  . Heart failure Mother   . Heart failure Father   . Heart failure Maternal Grandmother   . Myasthenia gravis Brother   . Stroke Brother   . Clotting disorder Neg Hx   . Colon cancer Neg Hx     Social History Social History   Tobacco Use  . Smoking status: Never Smoker  . Smokeless tobacco: Never Used  Substance Use Topics  . Alcohol use: No    Alcohol/week: 0.0 standard drinks  . Drug use: No     Allergies   Patient has no known allergies.   Review of Systems Review of Systems Ten systems reviewed and are negative for acute change, except as noted in the HPI.    Physical Exam Updated Vital Signs BP (!) 108/46   Pulse (!) 47   Resp 19   Ht 5\' 3"  (1.6 m)   Wt 81.6 kg   SpO2 99%   BMI 31.89 kg/m   Physical Exam  Constitutional: She is oriented to person, place, and time. She appears well-developed and well-nourished. No distress.  Nontoxic appearing, pleasant.  HENT:  Head: Normocephalic and atraumatic.  Eyes: Conjunctivae and EOM are normal. No scleral icterus.  Neck: Normal range of motion.  Cardiovascular: Intact distal pulses. An irregularly irregular rhythm present. Tachycardia present.  Pulmonary/Chest:  Effort normal. No stridor. No respiratory distress. She has no wheezes.  Lungs CTAB. Respirations even and unlabored.  Abdominal: Soft. She exhibits no distension and no mass. There is no tenderness. There is no guarding.  Musculoskeletal: Normal range of motion.  Trace edema in ankles bilaterally  Neurological: She is alert and oriented to person, place, and time. She exhibits normal muscle tone. Coordination normal.  Skin: Skin is warm and dry. No rash noted. She is not diaphoretic. No erythema. No pallor.  Psychiatric: She has a normal mood and affect. Her behavior is normal.  Nursing note and vitals reviewed.    ED Treatments / Results  Labs (all labs ordered are listed, but only abnormal results  are displayed) Labs Reviewed  COMPREHENSIVE METABOLIC PANEL - Abnormal; Notable for the following components:      Result Value   Glucose, Bld 113 (*)    Total Protein 6.3 (*)    All other components within normal limits  CBC  PROTIME-INR  MAGNESIUM  I-STAT TROPONIN, ED    EKG EKG Interpretation  Date/Time:  Thursday November 24 2017 03:33:33 EDT Ventricular Rate:  85 PR Interval:    QRS Duration: 80 QT Interval:  348 QTC Calculation: 414 R Axis:   19 Text Interpretation:  Atrial fibrillation Nonspecific ST abnormality When compared with ECG of EARLIER SAME DATE HEART RATE has decreased Confirmed by Delora Fuel ([id number]) on 11/24/2017 3:36:52 AM    EKG Interpretation  Date/Time:  Thursday November 24 2017 05:06:10 EDT Ventricular Rate:  47 PR Interval:    QRS Duration: 73 QT Interval:  436 QTC Calculation: 386 R Axis:   40 Text Interpretation:  Sinus bradycardia Otherwise within normal limits When compared with ECG of EARLIER SAME DATE Nonspecific ST abnormality has resolved Confirmed by Delora Fuel ([id number]) on 11/24/2017 5:12:04 AM       Radiology Dg Chest 2 View  Result Date: 11/24/2017 CLINICAL DATA:  Acute onset of heart flutter and generalized chest pain. EXAM: CHEST -  2 VIEW COMPARISON:  Chest radiograph performed 03/18/2016, and CTA of the chest performed 05/27/2017 FINDINGS: The lungs are well-aerated and clear. There is no evidence of focal opacification, pleural effusion or pneumothorax. The heart is borderline normal in size. No acute osseous abnormalities are seen. IMPRESSION: No acute cardiopulmonary process seen. Electronically Signed   By: Garald Balding M.D.   On: 11/24/2017 03:19    Procedures Procedures (including critical care time)   Cardiac Catheterization Conclusions, 03/15/16 1. Mild non-obstructive CAD, mid-LAD irregularity 2. Widely patent left main, left circumflex, and RCA 3. Normal LV function    Medications Ordered in ED Medications  diltiazem (CARDIZEM) 1 mg/mL load via infusion 10 mg (10 mg Intravenous Bolus from Bag 11/24/17 0324)    And  diltiazem (CARDIZEM) 100 mg in dextrose 5% 142mL (1 mg/mL) infusion (5 mg/hr Intravenous New Bag/Given 11/24/17 0325)  apixaban (ELIQUIS) tablet 5 mg (has no administration in time range)  propofol (DIPRIVAN) 10 mg/mL bolus/IV push 40.8 mg (has no administration in time range)  propofol (DIPRIVAN) 10 mg/mL bolus/IV push (40 mg Intravenous Given 11/24/17 0502)    CRITICAL CARE Performed by: Antonietta Breach   Total critical care time: 45 minutes  Critical care time was exclusive of separately billable procedures and treating other patients.  Critical care was necessary to treat or prevent imminent or life-threatening deterioration.  Critical care was time spent personally by me on the following activities: development of treatment plan with patient and/or surrogate as well as nursing, discussions with consultants, evaluation of patient's response to treatment, examination of patient, obtaining history from patient or surrogate, ordering and performing treatments and interventions, ordering and review of laboratory studies, ordering and review of radiographic studies, pulse oximetry and re-evaluation  of patient's condition.   Initial Impression / Assessment and Plan / ED Course  I have reviewed the triage vital signs and the nursing notes.  Pertinent labs & imaging results that were available during my care of the patient were reviewed by me and considered in my medical decision making (see chart for details).     2:35 AM 77 year old female presents to the emergency department for evaluation of chest pressure which woke her from sleep  at 1 AM.  She was found to be in A. fib with RVR.  No complaints of shortness of breath.  No recent fevers or viral illness.  Was given 10mg  metoprolol by EMS prior to arrival.  3:41 AM Cardizem given.  Patient better rate controlled, but remains in A. fib.  Denies any current complaints of chest pain or pressure.  Have discussed cardioversion with the patient.  She is agreeable to proceed with this.  CHADSVASC score is 6  5:11 AM Cardioversion completed under conscious sedation.  See attending MD note for procedure details.  Patient tolerated well.  Converted to sinus bradycardia.  6:30 AM Patient with heart rate in the 50s.  States this is her normal resting heart rate.  Her blood pressure has improved with IV fluids.  She is awake, alert, acting at baseline.  Remains in normal sinus rhythm.  Will attempt to ambulate to the bathroom to ensure no lightheadedness or decompensation.  7:00 AM Patient ambulated well without complaints of dizziness.  She has no complaints of chest pain or shortness of breath.  Vitals remained stable.  Patient appropriate for discharge at this time.  All questions answered.  Patient will attempt to find a ride back to her independent living facility, but may require transport via [place].   Final Clinical Impressions(s) / ED Diagnoses   Final diagnoses:  Atrial fibrillation with RVR [facility])    ED Discharge Orders         Ordered    Amb referral to AFIB Clinic     11/24/17 0254           Antonietta Breach, PA-C 76/72/09  [id number]    Delora Fuel, MD 62/83/66 [phone]

## 2017-11-24 NOTE — Discharge Instructions (Signed)
Your found to be in atrial fibrillation while in the emergency department today.  You underwent cardioversion to regulate your heart rate.    Continue your daily medications as prescribed, but begin taking Eliquis.  Do not stop this medication unless advised to do so by your primary doctor or a cardiologist.  Eliquis is a blood thinner.  It will make it more difficult for bleeding to stop.  Should you fall or hit your head in any way, return to the ED for evaluation as head injuries on a blood thinner put you at increased risk for intracranial bleeding.  You have been referred to the atrial fibrillation clinic for follow-up.  You should receive a call to schedule close follow-up in the office.  If you do not hear from the clinic by midday tomorrow, call to schedule an appointment.  You may follow-up with your primary care doctor as needed.  Return to the emergency department for new or concerning symptoms.  Information on my medicine - ELIQUIS (apixaban)  Why was Eliquis prescribed for you? Eliquis was prescribed for you to reduce the risk of a blood clot forming that can cause a stroke if you have a medical condition called atrial fibrillation (a type of irregular heartbeat).  What do You need to know about Eliquis ? Take your Eliquis TWICE DAILY - one tablet in the morning and one tablet in the evening with or without food. If you have difficulty swallowing the tablet whole please discuss with your pharmacist how to take the medication safely.  Take Eliquis exactly as prescribed by your doctor and DO NOT stop taking Eliquis without talking to the doctor who prescribed the medication.  Stopping may increase your risk of developing a stroke.  Refill your prescription before you run out.  After discharge, you should have regular check-up appointments with your healthcare provider that is prescribing your Eliquis.  In the future your dose may need to be changed if your kidney function or weight  changes by a significant amount or as you get older.  What do you do if you miss a dose? If you miss a dose, take it as soon as you remember on the same day and resume taking twice daily.  Do not take more than one dose of ELIQUIS at the same time to make up a missed dose.  Important Safety Information A possible side effect of Eliquis is bleeding. You should call your healthcare provider right away if you experience any of the following: ? Bleeding from an injury or your nose that does not stop. ? Unusual colored urine (red or dark brown) or unusual colored stools (red or black). ? Unusual bruising for unknown reasons. ? A serious fall or if you hit your head (even if there is no bleeding).  Some medicines may interact with Eliquis and might increase your risk of bleeding or clotting while on Eliquis. To help avoid this, consult your healthcare provider or pharmacist prior to using any new prescription or non-prescription medications, including herbals, vitamins, non-steroidal anti-inflammatory drugs (NSAIDs) and supplements.  This website has more information on Eliquis (apixaban): http://www.eliquis.com/eliquis/home

## 2017-11-25 ENCOUNTER — Telehealth: Payer: Self-pay | Admitting: Cardiovascular Disease

## 2017-11-25 NOTE — Telephone Encounter (Signed)
Spoke with the pt and she reported that she was in the ER yesterday with rapid Afib and had a cardioversion. She is feeling well today but nervous that she cannot get in with Dr. Sallyanne Kuster before her trip to [place] to see her son on 8/15... I made her a follow appointment with [facility] and advised her to call us if anything changes and to just relax over the weekend. I will also forward message to Dr. Loletha Grayer and will let her know of he has any further recommendations. Pt agrees with plan and will call back with any questions or problems. Pt also has appt with Dr. Loletha Grayer on 01/09/18.

## 2017-11-25 NOTE — Telephone Encounter (Signed)
New Message:       Pt is calling and states she was just released from the hosp. Pt would like a soon appt and I offered 12/06/17 with Dr. Loletha Grayer

## 2017-11-25 NOTE — Telephone Encounter (Signed)
Appt rescheduled with Jory Sims NP for Monday 11/28/17. Pt agreed.

## 2017-11-27 NOTE — Progress Notes (Deleted)
Cardiology Office Note   Date:  11/27/2017   ID:  Michele Thompson, DOB February 19, 1941, MRN [id number]  PCP:  Kathyrn Lass, MD  Cardiologist: Dr. Sallyanne Kuster No chief complaint on file.    History of Present Illness: Michele Thompson is a 77 y.o. female who presents for ongoing assessment and management of dyspnea, minor coronary artery disease with suspicion for coronary vasospasm, hyperlipidemia, hypertension, and history of massive pulmonary embolism complicated by cardiorespiratory arrest while on estrogen therapy in 2017.  The patient was last seen in our office by Dr. Sallyanne Kuster with complaints of worsening dyspnea.  Repeat CT scan was negative for new pulmonary embolism.  Echocardiogram revealed normal LV systolic function with no evidence of elevated filling pressures.    There was some suspicion of chronotropic incompetence but this was ruled out when her heart rates were between 70 and 90s with activity.  The patient has at [place] functional class II-III dyspnea.  She is intolerant of atorvastatin causing significant myalgias in her thighs.  Stopping it resolves her symptoms.  Beta-blocker was discontinued due to a 48-hour Holter monitor which revealed mild to moderate sinus bradycardia and blunted circadian rhythm variation.  The patient had sinus pauses of 2.3 seconds in duration and rare PACs per monitor in 2017  It was felt that the patient symptoms were likely related to obesity and deconditioning.  Atorvastatin was restarted with coenzyme Q 10 supplementation, and if this did not work they were going to switch her to rosuvastatin 10 mg daily.  If neither was tolerated consideration for PCSK9 inhibitor would be discussed.  On 11/24/2017 the patient was seen in the emergency room for atrial fibrillation with RVR.  Patient had cardioversion completed in the ER, which converted her to sinus bradycardia heart rate of 47 bpm.  Symptoms on presentation with chest pain.  She was also  treated with IV diltiazem.  After close observation in the emergency room her vital signs remained stable she remained in normal sinus rhythm and she was returned home.  She was to be referred to the atrial fibrillation clinic but requested a follow-up in our office.  Past Medical History:  Diagnosis Date  . Arthritis   . Coronary artery disease 03/16/2016   a. admx with angina and mildly elevated Troponin >> LHC: mLAD 30 >> CCB started for poss microvascular angina  . Diverticulosis   . Hyperlipidemia 03/16/2016  . Hypertension   . PE (pulmonary thromboembolism) ([place]) 03/17/2016   Bilateral submassive pulmonary embolus with RUE and RLE DVT on venous duplex 02/2016 c/b PEA arrest // Echo 03/21/16: mild LVH, EF 60-65, no RWMA, Gr 1 DD, normal RVSF  . Sinus bradycardia 03/31/2016  . Thrombocytopenia ([place]) 02/2016   Profound thrombocytopenia noted upon admission for pulmonary embolism >> initially thought to be from Hep induced thrombocytopenia given recent admit for cardiac cath and Heparin use // However, HIT Ab was neg (0.184) // Hematology consult pending  . Thyroid disease   . Vascular malformation    mid ascending colon    Past Surgical History:  Procedure Laterality Date  . CARDIAC CATHETERIZATION N/A 03/15/2016   Procedure: Left Heart Cath and Coronary Angiography;  Surgeon: [name], MD;  Location: [facility];  Service: Cardiovascular;  Laterality: N/A;  . TOTAL KNEE ARTHROPLASTY Right 12/27/2016   Procedure: RIGHT TOTAL KNEE ARTHROPLASTY;  Surgeon: Gaynelle Arabian, MD;  Location: [facility];  Service: Orthopedics;  Laterality: Right;  Marland Kitchen Naplate  endometriosis     Current Outpatient Medications  Medication Sig Dispense Refill  . apixaban (ELIQUIS) 5 MG TABS tablet Take 1 tablet (5 mg total) by mouth 2 (two) times daily. 60 tablet 1  . aspirin EC 81 MG tablet Take 81 mg by mouth daily.    Marland Kitchen atorvastatin (LIPITOR) 40 MG tablet Take 1 tablet (40 mg  total) by mouth daily at 6 PM. 90 tablet 3  . Cholecalciferol (VITAMIN D3) 2000 units TABS Take 1 tablet by mouth daily.    . diclofenac sodium (VOLTAREN) 1 % GEL Apply 2 g topically daily as needed.    . gabapentin (NEURONTIN) 300 MG capsule Take 1 capsule by mouth at bedtime.    . hydrochlorothiazide (MICROZIDE) 12.5 MG capsule Take 12.5 mg by mouth daily.    Marland Kitchen levothyroxine (SYNTHROID, LEVOTHROID) 100 MCG tablet Take 100 mcg by mouth daily before breakfast.    . lisinopril (PRINIVIL,ZESTRIL) 40 MG tablet TAKE 1 TABLET EACH DAY. 90 tablet 3  . Melatonin 5 MG TABS Take 1 tablet by mouth at bedtime.    . ondansetron (ZOFRAN) 4 MG tablet Take 1 tablet (4 mg total) by mouth every 8 (eight) hours as needed for nausea or vomiting. 6 tablet 0  . polyethylene glycol (MIRALAX / GLYCOLAX) packet Take 17 g by mouth 4 (four) times a week.      Current Facility-Administered Medications  Medication Dose Route Frequency Provider Last Rate Last Dose  . 0.9 %  sodium chloride infusion  500 mL Intravenous Once Nandigam, Venia Minks, MD        Allergies:   Patient has no known allergies.    Social History:  The patient  reports that she has never smoked. She has never used smokeless tobacco. She reports that she does not drink alcohol or use drugs.   Family History:  The patient's family history includes Heart failure in her father, maternal grandmother, and mother; Myasthenia gravis in her brother; Stroke in her brother.    ROS: All other systems are reviewed and negative. Unless otherwise mentioned in H&P    PHYSICAL EXAM: VS:  There were no vitals taken for this visit. , BMI There is no height or weight on file to calculate BMI. GEN: Well nourished, well developed, in no acute distress HEENT: normal Neck: no JVD, carotid bruits, or masses Cardiac: ***RRR; no murmurs, rubs, or gallops,no edema  Respiratory:  clear to auscultation bilaterally, normal work of breathing GI: soft, nontender,  nondistended, + BS MS: no deformity or atrophy Skin: warm and dry, no rash Neuro:  Strength and sensation are intact Psych: euthymic mood, full affect   EKG:  EKG {ACTION; IS/IS [id number]} ordered today. The ekg ordered today demonstrates ***   Recent Labs: 11/24/2017: ALT 12; BUN 14; Creatinine, Ser 0.86; Hemoglobin 12.8; Magnesium 2.3; Platelets 175; Potassium 4.9; Sodium 140    Lipid Panel    Component Value Date/Time   CHOL 142 03/15/2016 0524   TRIG 73 03/15/2016 0524   HDL 44 03/15/2016 0524   CHOLHDL 3.2 03/15/2016 0524   VLDL 15 03/15/2016 0524   LDLCALC 83 03/15/2016 0524      Wt Readings from Last 3 Encounters:  11/24/17 180 lb (81.6 kg)  10/04/17 185 lb (83.9 kg)  08/30/17 185 lb 12.8 oz (84.3 kg)      Other studies Reviewed: Additional studies/ records that were reviewed today include: ***. Review of the above records demonstrates: ***   ASSESSMENT AND PLAN:  1.  ***  Current medicines are reviewed at length with the patient today.    Labs/ tests ordered today include: *** Phill Myron. [place] Pugh, ANP, AACC   11/27/2017 1:27 PM     [facility] [address] [place], [place] [address] Phone: [phone]; Fax: [phone]

## 2017-11-28 ENCOUNTER — Ambulatory Visit (HOSPITAL_COMMUNITY)
Admission: RE | Admit: 2017-11-28 | Discharge: 2017-11-28 | Disposition: A | Discharge status: Home / Self Care | Payer: PPO | Source: Ambulatory Visit | Attending: Nurse Practitioner | Admitting: Nurse Practitioner

## 2017-11-28 ENCOUNTER — Ambulatory Visit: Payer: Self-pay | Admitting: Adult Health

## 2017-11-28 VITALS — BP 116/64 | HR 63 | Ht 63.0 in | Wt 186.0 lb

## 2017-11-28 DIAGNOSIS — Z96651 Presence of right artificial knee joint: Secondary | ICD-10-CM | POA: Insufficient documentation

## 2017-11-28 DIAGNOSIS — I4891 Unspecified atrial fibrillation: Secondary | ICD-10-CM | POA: Insufficient documentation

## 2017-11-28 DIAGNOSIS — Z79890 Hormone replacement therapy (postmenopausal): Secondary | ICD-10-CM | POA: Diagnosis not present

## 2017-11-28 DIAGNOSIS — Z79899 Other long term (current) drug therapy: Secondary | ICD-10-CM | POA: Diagnosis not present

## 2017-11-28 DIAGNOSIS — Z90710 Acquired absence of both cervix and uterus: Secondary | ICD-10-CM | POA: Diagnosis not present

## 2017-11-28 DIAGNOSIS — I2510 Atherosclerotic heart disease of native coronary artery without angina pectoris: Secondary | ICD-10-CM | POA: Insufficient documentation

## 2017-11-28 DIAGNOSIS — I10 Essential (primary) hypertension: Secondary | ICD-10-CM | POA: Insufficient documentation

## 2017-11-28 DIAGNOSIS — Z6832 Body mass index (BMI) 32.0-32.9, adult: Secondary | ICD-10-CM | POA: Diagnosis not present

## 2017-11-28 DIAGNOSIS — E785 Hyperlipidemia, unspecified: Secondary | ICD-10-CM | POA: Diagnosis not present

## 2017-11-28 DIAGNOSIS — Z86711 Personal history of pulmonary embolism: Secondary | ICD-10-CM | POA: Insufficient documentation

## 2017-11-28 MED ORDER — APIXABAN 5 MG PO TABS: 5.0000 mg | ORAL_TABLET | Freq: Two times a day (BID) | ORAL | 6 refills | Status: DC

## 2017-11-28 MED ORDER — DILTIAZEM HCL 30 MG PO TABS: ORAL_TABLET | ORAL | 1 refills | Status: DC

## 2017-11-28 NOTE — Progress Notes (Signed)
Primary Care Physician: Kathyrn Lass, MD Referring Physician: [facility] f/u    Michele Thompson is a 77 y.o. female with a h/o non obstructive CAD, HTN, bilateral submassive  PE with cardiopulmonary arrest, that was recently seen in the ER for afib that awoke pt with mild chest pressure. She initially  had RVR and was given IV metoprolol 10 mg IV en route and v rate had slowed on arrival but was still in afib. She had successful cardioversion.  She is in SR today. No further episodes.  Feels well. She feels that she probably does snore as per some people's remarks in the past. She currently sleeps alone and is in  an assisted living environment.  No excessive caffeine, no alcohol. Does not use tobacco. Has gained around 10 lbs since moving into  [facility]. She does attend the exercise classes there. Has had recent echo 05/1017 and [place] 2017.  She was placed on eliquis with a CHA2DS2VASc score of 6. She was on xarelto for at least 6 months after her PE and then placed on ASA.  Today, she denies symptoms of palpitations, chest pain, shortness of breath, orthopnea, PND, lower extremity edema, dizziness, presyncope, syncope, or neurologic sequela. The patient is tolerating medications without difficulties and is otherwise without complaint today.   Past Medical History:  Diagnosis Date  . Arthritis   . Coronary artery disease 03/16/2016   a. admx with angina and mildly elevated Troponin >> LHC: mLAD 30 >> CCB started for poss microvascular angina  . Diverticulosis   . Hyperlipidemia 03/16/2016  . Hypertension   . PE (pulmonary thromboembolism) ([place]) 03/17/2016   Bilateral submassive pulmonary embolus with RUE and RLE DVT on venous duplex 02/2016 c/b PEA arrest // Echo 03/21/16: mild LVH, EF 60-65, no RWMA, Gr 1 DD, normal RVSF  . Sinus bradycardia 03/31/2016  . Thrombocytopenia ([place]) 02/2016   Profound thrombocytopenia noted upon admission for pulmonary embolism >> initially thought to be  from Hep induced thrombocytopenia given recent admit for cardiac cath and Heparin use // However, HIT Ab was neg (0.184) // Hematology consult pending  . Thyroid disease   . Vascular malformation    mid ascending colon   Past Surgical History:  Procedure Laterality Date  . CARDIAC CATHETERIZATION N/A 03/15/2016   Procedure: Left Heart Cath and Coronary Angiography;  Surgeon: [name], MD;  Location: [facility];  Service: Cardiovascular;  Laterality: N/A;  . TOTAL KNEE ARTHROPLASTY Right 12/27/2016   Procedure: RIGHT TOTAL KNEE ARTHROPLASTY;  Surgeon: Gaynelle Arabian, MD;  Location: [facility];  Service: Orthopedics;  Laterality: Right;  Marland Kitchen VAGINAL HYSTERECTOMY  1983   endometriosis    Current Outpatient Medications  Medication Sig Dispense Refill  . apixaban (ELIQUIS) 5 MG TABS tablet Take 1 tablet (5 mg total) by mouth 2 (two) times daily. 60 tablet 6  . atorvastatin (LIPITOR) 40 MG tablet Take 1 tablet (40 mg total) by mouth daily at 6 PM. 90 tablet 3  . Cholecalciferol (VITAMIN D3) 2000 units TABS Take 1 tablet by mouth daily.    . diclofenac sodium (VOLTAREN) 1 % GEL Apply 2 g topically daily as needed.    . gabapentin (NEURONTIN) 300 MG capsule Take 1 capsule by mouth at bedtime.    . hydrochlorothiazide (MICROZIDE) 12.5 MG capsule Take 12.5 mg by mouth daily.    Marland Kitchen levothyroxine (SYNTHROID, LEVOTHROID) 100 MCG tablet Take 100 mcg by mouth daily before breakfast.    . lisinopril (PRINIVIL,ZESTRIL)  40 MG tablet TAKE 1 TABLET EACH DAY. 90 tablet 3  . diltiazem (CARDIZEM) 30 MG tablet Take 1 tablet every 4 hours AS NEEDED for AFIB heart rate >100 as long as top number of blood pressure >100. 45 tablet 1   Current Facility-Administered Medications  Medication Dose Route Frequency Provider Last Rate Last Dose  . 0.9 %  sodium chloride infusion  500 mL Intravenous Once Nandigam, Venia Minks, MD        No Known Allergies  Social History   Socioeconomic History  . Marital  status: Widowed    Spouse name: Not on file  . Number of children: Not on file  . Years of education: Not on file  . Highest education level: Not on file  Occupational History  . Not on file  Social Needs  . Financial resource strain: Not on file  . Food insecurity:    Worry: Not on file    Inability: Not on file  . Transportation needs:    Medical: Not on file    Non-medical: Not on file  Tobacco Use  . Smoking status: Never Smoker  . Smokeless tobacco: Never Used  Substance and Sexual Activity  . Alcohol use: No    Alcohol/week: 0.0 standard drinks  . Drug use: No  . Sexual activity: Never    Birth control/protection: Surgical    Comment: 1st intercourse 77 yo-Fewer than 5 partners  Lifestyle  . Physical activity:    Days per week: Not on file    Minutes per session: Not on file  . Stress: Not on file  Relationships  . Social connections:    Talks on phone: Not on file    Gets together: Not on file    Attends religious service: Not on file    Active member of club or organization: Not on file    Attends meetings of clubs or organizations: Not on file    Relationship status: Not on file  . Intimate partner violence:    Fear of current or ex partner: Not on file    Emotionally abused: Not on file    Physically abused: Not on file    Forced sexual activity: Not on file  Other Topics Concern  . Not on file  Social History Narrative   [facility]:   Originally from [place]. Moved to [place] in 2012. 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    Family History  Problem Relation Age of Onset  . Heart failure Mother   . Heart failure Father   . Heart failure Maternal Grandmother   . Myasthenia gravis Brother   . Stroke Brother   . Clotting disorder Neg Hx   . Colon cancer Neg Hx     ROS- All systems are reviewed and negative except as per the HPI above  Physical Exam: Vitals:   11/28/17 1350  BP:  116/64  Pulse: 63  Weight: 84.4 kg  Height: 5\' 3"  (1.6 m)   Wt Readings from Last 3 Encounters:  11/28/17 84.4 kg  11/24/17 81.6 kg  10/04/17 83.9 kg    Labs: Lab Results  Component Value Date   NA 140 11/24/2017   K 4.9 11/24/2017   CL 101 11/24/2017   CO2 28 11/24/2017   GLUCOSE 113 (H) 11/24/2017   BUN 14 11/24/2017   CREATININE 0.86 11/24/2017   CALCIUM 9.2 11/24/2017   PHOS 2.6 03/20/2016   MG 2.3 11/24/2017  Lab Results  Component Value Date   INR 0.90 11/24/2017   Lab Results  Component Value Date   CHOL 142 03/15/2016   HDL 44 03/15/2016   LDLCALC 83 03/15/2016   TRIG 73 03/15/2016     GEN- The patient is well appearing, alert and oriented x 3 today.   Head- normocephalic, atraumatic Eyes-  Sclera clear, conjunctiva pink Ears- hearing intact Oropharynx- clear Neck- supple, no JVP Lymph- no cervical lymphadenopathy Lungs- Clear to ausculation bilaterally, normal work of breathing Heart- Regular rate and rhythm, no murmurs, rubs or gallops, PMI not laterally displaced GI- soft, NT, ND, + BS Extremities- no clubbing, cyanosis, or edema MS- no significant deformity or atrophy Skin- no rash or lesion Psych- euthymic mood, full affect Neuro- strength and sensation are intact  EKG-NSR at 63 bpm, PR int 134 ms, qrs int 74 ms, qtc 395 bpm Echo-Study Conclusions  - Left ventricle: The cavity size was normal. Systolic function was   normal. The estimated ejection fraction was in the range of 60%   to 65%. Wall motion was normal; there were no regional wall   motion abnormalities. Left ventricular diastolic function   parameters were normal. - Left atrium: The atrium was mildly dilated.  LHC- 02/2016  1. Mild non-obstructive CAD, mid-LAD irregularity 2. Widely patent left main, left circumflex, and RCA 3. Normal LV function   No clear etiology to explain exertional or resting angina. The patient may have  microvascular angina. A trial of Ca++  Channel blockade may be reasonable.   Assessment and Plan: 1. New onset  afib Successful cardioversion General education re afib  Will not place on daily rate control as she is slow at baseline, at 63 bpm today I will rx cardizem 30 mg as needed for onset afib Reviewed when it is appropriate to be treated in the ER vrs trying the prn cardizem at home Will  refer for sleep study  2. Chadsvasc score of 6 Placed on eliquis 5 mg bid  Has been on xarelto in the past and did well Can stop ASA now on ELiquis  Bleeding precautions discussed  3. BMI of 32.95 Regular exercise encouraged as well as  diet modification   F/u with Dr. Loletha Grayer 9/23 afib clinic as needed  Butch Penny C. , [facility] [address] [place], [place] [address] [phone]

## 2017-11-28 NOTE — Progress Notes (Signed)
Thank you, Donna! MCr 

## 2017-11-28 NOTE — Patient Instructions (Signed)
Stop Aspirin  Cardizem 30mg  -- take 1 tablet every 4 hours AS NEEDED for AFIB heart rate >100 as long as top number of blood pressure >100.

## 2017-11-29 ENCOUNTER — Ambulatory Visit: Payer: Self-pay | Admitting: Physician Assistant

## 2017-12-05 ENCOUNTER — Telehealth (HOSPITAL_COMMUNITY): Payer: Self-pay

## 2017-12-05 ENCOUNTER — Telehealth: Payer: Self-pay | Admitting: Cardiovascular Disease

## 2017-12-05 NOTE — Telephone Encounter (Signed)
New message  Pt c/o BP issue: STAT if pt c/o blurred vision, one-sided weakness or slurred speech  1. What are your last 5 BP readings?175/81 153/77 136/68 128/71 137/87   2. Are you having any other symptoms (ex. Dizziness, headache, blurred vision, passed out)? Tired, headache   3. What is your BP issue? Bp has been elevated, The patient states that her bp is better today.

## 2017-12-05 NOTE — Telephone Encounter (Signed)
Spoke with pt. Pt sts that she has been out of town visiting family since 8/15 and will return on 12/13/17.  Pt sts that while traveling she could tell her BP was elevated she was fatigued and had a headache. She was traveling for 2 days. Once she reached her destination she checked her BP and it was 176/81. She has been anxious about the reading and began checking her BP several times a day (sometimes every hour). She has been taking all of her prescribed medications.  Pt sts that she has stayed in a relaxed the last couple of days and her BP readings have improved. Her BP this morning was 136/68 and then 128/71.  Adv pt that checking her BP hourly is not recommend being anxious could cause her BP to be elevated. Recommended that she continue to monitor her BP daily 1-2 hours after taking her medications and a second reading later in the afternoon if she would like. Adv pt to limit sodium in her diet.   Pt had new onset AFIB and was cardioverted on 11/24/17 successfully, pt sts that she is still in NSR, pt denies chest pain, sob, palpitations.  Adv pt to call the office if her BP is consistently elevated, or if symptoms develop (palpiations, sob, chest pain). Pt is scheduled to see Dr.C in Sept 2019.  Adv pt that I will fwd an update to Dr.C and we will call back if he has any additional recommendations.  Pt agreeable with plan and verbalized understanding.

## 2017-12-05 NOTE — Telephone Encounter (Signed)
Thank you.  She should always check her blood pressure after she has had a chance to relax, sitting down for at least 10 minutes.  I agree with your advice.  No change in medications

## 2017-12-05 NOTE — Telephone Encounter (Signed)
Returned pt [email or web address] 

## 2017-12-05 NOTE — Telephone Encounter (Signed)
error 

## 2017-12-14 ENCOUNTER — Other Ambulatory Visit: Payer: Self-pay | Admitting: Cardiology

## 2017-12-14 NOTE — Telephone Encounter (Signed)
Rx sent to pharmacy   

## 2017-12-26 ENCOUNTER — Telehealth: Payer: Self-pay

## 2017-12-26 DIAGNOSIS — I4891 Unspecified atrial fibrillation: Secondary | ICD-10-CM

## 2017-12-26 DIAGNOSIS — R002 Palpitations: Secondary | ICD-10-CM

## 2017-12-26 NOTE — Telephone Encounter (Signed)
Event monitor ordered. See patient email from 12/20/17.

## 2017-12-27 ENCOUNTER — Telehealth: Payer: Self-pay | Admitting: Cardiovascular Disease

## 2017-12-27 NOTE — Telephone Encounter (Signed)
Called patient and LVM to call back to schedule 14 day monitor.

## 2017-12-28 ENCOUNTER — Ambulatory Visit (INDEPENDENT_AMBULATORY_CARE_PROVIDER_SITE_OTHER): Payer: PPO

## 2017-12-28 DIAGNOSIS — I4891 Unspecified atrial fibrillation: Secondary | ICD-10-CM | POA: Diagnosis not present

## 2017-12-28 DIAGNOSIS — R002 Palpitations: Secondary | ICD-10-CM

## 2018-01-03 DIAGNOSIS — Z6830 Body mass index (BMI) 30.0-30.9, adult: Secondary | ICD-10-CM | POA: Diagnosis not present

## 2018-01-03 DIAGNOSIS — I10 Essential (primary) hypertension: Secondary | ICD-10-CM | POA: Diagnosis not present

## 2018-01-03 DIAGNOSIS — Z86711 Personal history of pulmonary embolism: Secondary | ICD-10-CM | POA: Diagnosis not present

## 2018-01-03 DIAGNOSIS — I252 Old myocardial infarction: Secondary | ICD-10-CM | POA: Diagnosis not present

## 2018-01-03 DIAGNOSIS — E890 Postprocedural hypothyroidism: Secondary | ICD-10-CM | POA: Diagnosis not present

## 2018-01-03 DIAGNOSIS — I4891 Unspecified atrial fibrillation: Secondary | ICD-10-CM | POA: Diagnosis not present

## 2018-01-09 ENCOUNTER — Ambulatory Visit: Payer: Self-pay | Admitting: Cardiovascular Disease

## 2018-01-16 ENCOUNTER — Encounter: Payer: Self-pay | Admitting: Physician Assistant

## 2018-01-16 ENCOUNTER — Ambulatory Visit (INDEPENDENT_AMBULATORY_CARE_PROVIDER_SITE_OTHER): Payer: PPO | Admitting: Physician Assistant

## 2018-01-16 VITALS — BP 138/66 | HR 72 | Ht 63.0 in | Wt 183.0 lb

## 2018-01-16 DIAGNOSIS — Z7901 Long term (current) use of anticoagulants: Secondary | ICD-10-CM | POA: Diagnosis not present

## 2018-01-16 DIAGNOSIS — I2510 Atherosclerotic heart disease of native coronary artery without angina pectoris: Secondary | ICD-10-CM | POA: Diagnosis not present

## 2018-01-16 DIAGNOSIS — I480 Paroxysmal atrial fibrillation: Secondary | ICD-10-CM | POA: Diagnosis not present

## 2018-01-16 DIAGNOSIS — E785 Hyperlipidemia, unspecified: Secondary | ICD-10-CM

## 2018-01-16 DIAGNOSIS — R079 Chest pain, unspecified: Secondary | ICD-10-CM | POA: Diagnosis not present

## 2018-01-16 DIAGNOSIS — I10 Essential (primary) hypertension: Secondary | ICD-10-CM | POA: Diagnosis not present

## 2018-01-16 NOTE — Progress Notes (Signed)
Cardiology Office Note    Date:  01/16/2018   ID:  Michele Thompson, DOB 1941-04-17, MRN [id number]  PCP:  Michele Lass, MD  Cardiologist:  Dr. Sallyanne Thompson  Chief Complaint  Patient presents with  . Follow-up    seen for Dr. Sallyanne Thompson    History of Present Illness:  Michele Thompson is a 77 y.o. female with PMH of massive PE complicated by cardiorespiratory arrest while on estrogen therapy in 2017, minor coronary artery disease, suspicion for coronary vasospasm, hypertension and hyperlipidemia.  48-hour Holter monitor obtained in 2017 showed persistent mild to moderate sinus bradycardia and blunted circadian rhythm variation.  She also had occasional sinus pauses up to 2.3 seconds in duration and rare PACs.  Her beta-blocker was discontinued at the time.  Cardiac catheterization performed on 03/15/2016 showed very mild nonobstructive disease in the mid LAD, widely patent left main, left circumflex and RCA, normal LV function.  Etiology behind her exertional angina was unclear at the time, it was felt patient may had microvascular angina versus vasospasm.  Last CT angiogram of the chest was obtained in February 2019 which showed no evidence of PE, mild aortic atherosclerosis.  Echocardiogram obtained on 05/27/2017 showed EF 60 to 65%, no wall motion abnormality, mild LAE.  Although there was some suspicion for chronotropic incompetence in the past, however her heart rate has always been in the 70s to 90s with activity.  She was last seen by Dr. Sallyanne Thompson in March 2019, at which time she continued to have dyspnea with exertion.  More recently, patient presented to the ED on 11/24/2017 with chest pain that woke her up from sleep around 1 AM.  She was found to be in new atrial fibrillation with RVR on EMS arrival.  Symptoms improved on IV Lopressor.  She eventually underwent DC cardioversion in the ED.  Post cardioversion, her heart rate was in the 50s.  She was released back to her independent living facility.   Patient was seen in the A. fib clinic on 11/28/2017, she was placed on as needed dose of Cardizem 30 mg for recurrent atrial fibrillation, otherwise she was not placed on a daily rate control therapy due to baseline bradycardia.  Her aspirin was stopped and she was continued on Eliquis.  Patient presents today for cardiology office visit.  I have reviewed with her her 2-week event monitor report.  There was a single episode of possible SVT and an another episode where her heart rate bradycardia down to the high 30s.  The significant bradycardia occurred around 2 AM in the morning.  As far as the SVT episode, it only occurred for 22 beats and she was asymptomatic.  It was a auto triggered event.  She occasionally has some chest discomfort, however this only lasted about 15 seconds each up to a minute.  This is not related to exertion and the usually occurs at rest.  She is able to ride her stationary bicycle for 30 minutes without any issue.  I would hold off on ischemic work-up given the reassuring cardiac catheterization in 2017.  She is aware that if her chest discomfort become more frequent or last longer, she will need to contact cardiology and let us know.   Past Medical History:  Diagnosis Date  . Arthritis   . Coronary artery disease 03/16/2016   a. admx with angina and mildly elevated Troponin >> LHC: mLAD 30 >> CCB started for poss microvascular angina  . Diverticulosis   . Hyperlipidemia  03/16/2016  . Hypertension   . PE (pulmonary thromboembolism) ([place]) 03/17/2016   Bilateral submassive pulmonary embolus with RUE and RLE DVT on venous duplex 02/2016 c/b PEA arrest // Echo 03/21/16: mild LVH, EF 60-65, no RWMA, Gr 1 DD, normal RVSF  . Sinus bradycardia 03/31/2016  . Thrombocytopenia ([place]) 02/2016   Profound thrombocytopenia noted upon admission for pulmonary embolism >> initially thought to be from Hep induced thrombocytopenia given recent admit for cardiac cath and Heparin use // However,  HIT Ab was neg (0.184) // Hematology consult pending  . Thyroid disease   . Vascular malformation    mid ascending colon    Past Surgical History:  Procedure Laterality Date  . CARDIAC CATHETERIZATION N/A 03/15/2016   Procedure: Left Heart Cath and Coronary Angiography;  Surgeon: [name], MD;  Location: [facility];  Service: Cardiovascular;  Laterality: N/A;  . TOTAL KNEE ARTHROPLASTY Right 12/27/2016   Procedure: RIGHT TOTAL KNEE ARTHROPLASTY;  Surgeon: Gaynelle Arabian, MD;  Location: [facility];  Service: Orthopedics;  Laterality: Right;  Marland Kitchen VAGINAL HYSTERECTOMY  1983   endometriosis    Current Medications: Outpatient Medications Prior to Visit  Medication Sig Dispense Refill  . apixaban (ELIQUIS) 5 MG TABS tablet Take 1 tablet (5 mg total) by mouth 2 (two) times daily. 60 tablet 6  . atorvastatin (LIPITOR) 40 MG tablet Take 1 tablet (40 mg total) by mouth daily at 6 PM. Keep Appointment as scheduled. 60 tablet 0  . Cholecalciferol (VITAMIN D3) 2000 units TABS Take 1 tablet by mouth daily.    . diclofenac sodium (VOLTAREN) 1 % GEL Apply 2 g topically daily as needed.    . diltiazem (CARDIZEM) 30 MG tablet Take 1 tablet every 4 hours AS NEEDED for AFIB heart rate >100 as long as top number of blood pressure >100. 45 tablet 1  . gabapentin (NEURONTIN) 300 MG capsule Take 1 capsule by mouth at bedtime.    . hydrochlorothiazide (MICROZIDE) 12.5 MG capsule Take 12.5 mg by mouth daily.    Marland Kitchen levothyroxine (SYNTHROID, LEVOTHROID) 100 MCG tablet Take 100 mcg by mouth daily before breakfast. TAKE WHOLE TABLET (100 MCG) ALL DAYS EXCEPT ON SUNDAYS TAKE 1/2 TABLET (50 MCG).    Marland Kitchen lisinopril (PRINIVIL,ZESTRIL) 40 MG tablet TAKE 1 TABLET EACH DAY. 90 tablet 3   Facility-Administered Medications Prior to Visit  Medication Dose Route Frequency Provider Last Rate Last Dose  . 0.9 %  sodium chloride infusion  500 mL Intravenous Once Nandigam, Venia Minks, MD         Allergies:   Patient has no  known allergies.   Social History   Socioeconomic History  . Marital status: Widowed    Spouse name: Not on file  . Number of children: Not on file  . Years of education: Not on file  . Highest education level: Not on file  Occupational History  . Not on file  Social Needs  . Financial resource strain: Not on file  . Food insecurity:    Worry: Not on file    Inability: Not on file  . Transportation needs:    Medical: Not on file    Non-medical: Not on file  Tobacco Use  . Smoking status: Never Smoker  . Smokeless tobacco: Never Used  Substance and Sexual Activity  . Alcohol use: No    Alcohol/week: 0.0 standard drinks  . Drug use: No  . Sexual activity: Never    Birth control/protection: Surgical    Comment:  1st intercourse 77 yo-Fewer than 5 partners  Lifestyle  . Physical activity:    Days per week: Not on file    Minutes per session: Not on file  . Stress: Not on file  Relationships  . Social connections:    Talks on phone: Not on file    Gets together: Not on file    Attends religious service: Not on file    Active member of club or organization: Not on file    Attends meetings of clubs or organizations: Not on file    Relationship status: Not on file  Other Topics Concern  . Not on file  Social History Narrative   [facility]:   Originally from [place]. Moved to [place] in 2012. 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     Family History:  The patient's family history includes Heart failure in her father, maternal grandmother, and mother; Myasthenia gravis in her brother; Stroke in her brother.   ROS:   Please see the history of present illness.    ROS All other systems reviewed and are negative.   PHYSICAL EXAM:   VS:  BP 138/66 (BP Location: Left Arm, Patient Position: Sitting, Cuff Size: Normal)   Pulse 72   Ht 5\' 3"  (1.6 m)   Wt 183 lb (83 kg)   BMI 32.42 kg/m    GEN: Well nourished,  well developed, in no acute distress  HEENT: normal  Neck: no JVD, carotid bruits, or masses Cardiac: RRR; no murmurs, rubs, or gallops,no edema  Respiratory:  clear to auscultation bilaterally, normal work of breathing GI: soft, nontender, nondistended, + BS MS: no deformity or atrophy  Skin: warm and dry, no rash Neuro:  Alert and Oriented x 3, Strength and sensation are intact Psych: euthymic mood, full affect  Wt Readings from Last 3 Encounters:  01/16/18 183 lb (83 kg)  11/28/17 186 lb (84.4 kg)  11/24/17 180 lb (81.6 kg)      Studies/Labs Reviewed:   EKG:  EKG is not ordered today.  She is in sinus rhythm based on physical exam.  Recent Labs: 11/24/2017: ALT 12; BUN 14; Creatinine, Ser 0.86; Hemoglobin 12.8; Magnesium 2.3; Platelets 175; Potassium 4.9; Sodium 140   Lipid Panel    Component Value Date/Time   CHOL 142 03/15/2016 0524   TRIG 73 03/15/2016 0524   HDL 44 03/15/2016 0524   CHOLHDL 3.2 03/15/2016 0524   VLDL 15 03/15/2016 0524   LDLCALC 83 03/15/2016 0524    Additional studies/ records that were reviewed today include:   Echo 05/27/2017 LV EF: 60% -   65% Study Conclusions  - Left ventricle: The cavity size was normal. Systolic function was   normal. The estimated ejection fraction was in the range of 60%   to 65%. Wall motion was normal; there were no regional wall   motion abnormalities. Left ventricular diastolic function   parameters were normal. - Left atrium: The atrium was mildly dilated.    ASSESSMENT:    1. PAF (paroxysmal atrial fibrillation) ([place])   2. Chronic anticoagulation   3. Chest pain, unspecified type   4. Coronary artery disease involving native coronary artery of native heart without angina pectoris   5. Essential hypertension   6. Hyperlipidemia, unspecified hyperlipidemia type      PLAN:  In order of problems listed above:  1. Paroxysmal atrial fibrillation: Started on Eliquis.  Not on rate control therapy given  prior  history of nighttime bradycardia with pauses longer than 2 seconds.  Also seen on recent 2-week event monitor as well.  I would recommend continue on the current therapy with as needed dose of diltiazem outside to help control breakthrough atrial fibrillation. CHA2DS2-Vasc score 5 (age, CAD, HTN, female).  Obtain CBC to make sure her hemoglobin is stable on systemic anticoagulation therapy.  2. Chest pain: Her chest pain is fairly atypical and it does not occur with exertion.  Last from 15 seconds to a minute.  She had a cardiac catheterization near the end of 2017 that showed very mild disease.  Given the atypical nature of the symptom, we recommended observation for now.  She is aware that she need to contact cardiology if her chest pain become more frequent or last longer.  3. CAD: Mild disease seen on previous cardiac catheterization in 2017.  Aspirin has been stopped due to the need for Eliquis  4. Hypertension: Blood pressure improved after the recent travel.  Her blood pressure was in the 160s 170s, now improved back down to the 130s.  5. Hyperlipidemia: On Lipitor 40 mg daily.  Continue on current therapy.    Medication Adjustments/Labs and Tests Ordered: Current medicines are reviewed at length with the patient today.  Concerns regarding medicines are outlined above.  Medication changes, Labs and Tests ordered today are listed in the Patient Instructions below. Patient Instructions  Medication Instructions:  Your physician recommends that you continue on your current medications as directed. Please refer to the Current Medication list given to you today.  If you need a refill on your cardiac medications before your next appointment, please call your pharmacy.  Labwork: Your physician recommends that you return for lab work in: [employer] in our office  Testing/Procedures: NONE   Follow-Up: Your physician recommends that you schedule a follow-up appointment  in: [place].  Any Other Special Instructions Will Be Listed Below (If Applicable).          Hilbert Corrigan, [place]  01/16/2018 6:15 PM    [facility] [place], [place], [place]  [address] Phone: [phone]; Fax: [phone]

## 2018-01-16 NOTE — Patient Instructions (Signed)
Medication Instructions:  Your physician recommends that you continue on your current medications as directed. Please refer to the Current Medication list given to you today.  If you need a refill on your cardiac medications before your next appointment, please call your pharmacy.  Labwork: Your physician recommends that you return for lab work in: [employer] in our office  Testing/Procedures: NONE   Follow-Up: Your physician recommends that you schedule a follow-up appointment in: [place].  Any Other Special Instructions Will Be Listed Below (If Applicable).

## 2018-01-17 LAB — CBC
Hematocrit: 37.7 % (ref 34.0–46.6)
Hemoglobin: 12.6 g/dL (ref 11.1–15.9)
MCH: 31.2 pg (ref 26.6–33.0)
MCHC: 33.4 g/dL (ref 31.5–35.7)
MCV: 93 fL (ref 79–97)
Platelets: 198 10*3/uL (ref 150–450)
RBC: 4.04 x10E6/uL (ref 3.77–5.28)
RDW: 12.8 % (ref 12.3–15.4)
WBC: 5.6 10*3/uL (ref 3.4–10.8)

## 2018-01-26 DIAGNOSIS — M1712 Unilateral primary osteoarthritis, left knee: Secondary | ICD-10-CM | POA: Diagnosis not present

## 2018-01-26 DIAGNOSIS — Z96651 Presence of right artificial knee joint: Secondary | ICD-10-CM | POA: Diagnosis not present

## 2018-02-07 ENCOUNTER — Other Ambulatory Visit: Payer: Self-pay | Admitting: Cardiovascular Disease

## 2018-03-08 DIAGNOSIS — N952 Postmenopausal atrophic vaginitis: Secondary | ICD-10-CM | POA: Diagnosis not present

## 2018-03-27 ENCOUNTER — Ambulatory Visit: Payer: PPO | Admitting: Cardiovascular Disease

## 2018-03-27 ENCOUNTER — Encounter: Payer: Self-pay | Admitting: Cardiovascular Disease

## 2018-03-27 VITALS — BP 132/68 | HR 72 | Ht 63.0 in | Wt 185.2 lb

## 2018-03-27 DIAGNOSIS — I25111 Atherosclerotic heart disease of native coronary artery with angina pectoris with documented spasm: Secondary | ICD-10-CM | POA: Diagnosis not present

## 2018-03-27 DIAGNOSIS — I10 Essential (primary) hypertension: Secondary | ICD-10-CM | POA: Diagnosis not present

## 2018-03-27 DIAGNOSIS — E7800 Pure hypercholesterolemia, unspecified: Secondary | ICD-10-CM

## 2018-03-27 DIAGNOSIS — I480 Paroxysmal atrial fibrillation: Secondary | ICD-10-CM | POA: Diagnosis not present

## 2018-03-27 DIAGNOSIS — Z86711 Personal history of pulmonary embolism: Secondary | ICD-10-CM

## 2018-03-27 NOTE — Patient Instructions (Signed)
Medication Instructions:  Dr Croitoru recommends that you continue on your current medications as directed. Please refer to the Current Medication list given to you today.  If you need a refill on your cardiac medications before your next appointment, please call your pharmacy.   Follow-Up: At [facility], you and your health needs are our priority.  As part of our continuing mission to provide you with exceptional heart care, we have created designated Provider Care Teams.  These Care Teams include your primary Cardiologist (physician) and Advanced Practice Providers (APPs -  Physician Assistants and Nurse Practitioners) who all work together to provide you with the care you need, when you need it. You will need a follow up appointment in 12 months.  Please call our office 2 months in advance to schedule this appointment.  You may see Mihai Croitoru, MD or one of the following Advanced Practice Providers on your designated Care Team: Hao Meng, PA-C .  [facility], PA-C . You will receive a reminder letter in the mail two months in advance. If you don't receive a letter, please call our office to schedule the follow-up appointment. 

## 2018-03-27 NOTE — Progress Notes (Signed)
.  Cardiology Office Note:    Date:  03/28/2018   ID:  Michele Thompson, DOB 02-21-1941, MRN [id number]  PCP:  Kathyrn Lass, MD  Cardiologist:  Sanda Klein, MD   Referring MD: Kathyrn Lass, MD   Chief Complaint  Patient presents with  . Follow-up  Exertional dyspnea  History of Present Illness:    Michele Thompson is a 77 y.o. female with a hx of massive pulmonary embolism, complicated by cardiorespiratory arrest, while on estrogen therapy in 2017.  She had an episode of paroxysmal atrial fibrillation and underwent cardioversion in August 2019.  She also has minor coronary artery disease and suspicion for coronary vasospasm on previous angiography, hypertension and hyperlipidemia.  Previous Holter monitor showed evidence of sinus node dysfunction and chronotropic incompetence while taking beta-blockers.  Consequently, she is only receiving "as needed" diltiazem for episodes of palpitations consistent with atrial fibrillation, rather than a scheduled AV blocking agent.  She has normal left ventricular systolic function on echo and had normal pulmonary function test in 2018.   She has noticed rather erratic readings on her home blood pressure monitor at times.  Possibly had atrial fibrillation during those episodes, but she did not feel particularly bad.  Not had any bleeding episodes while taking Eliquis.  She has not had falls or injuries.  Past Medical History:  Diagnosis Date  . Arthritis   . Coronary artery disease 03/16/2016   a. admx with angina and mildly elevated Troponin >> LHC: mLAD 30 >> CCB started for poss microvascular angina  . Diverticulosis   . Hyperlipidemia 03/16/2016  . Hypertension   . PE (pulmonary thromboembolism) ([name]) 03/17/2016   Bilateral submassive pulmonary embolus with RUE and RLE DVT on venous duplex 02/2016 c/b PEA arrest // Echo 03/21/16: mild LVH, EF 60-65, no RWMA, Gr 1 DD, normal RVSF  . Sinus bradycardia 03/31/2016  . Thrombocytopenia ([place]) 02/2016   Profound thrombocytopenia noted upon admission for pulmonary embolism >> initially thought to be from Hep induced thrombocytopenia given recent admit for cardiac cath and Heparin use // However, HIT Ab was neg (0.184) // Hematology consult pending  . Thyroid disease   . Vascular malformation    mid ascending colon    Past Surgical History:  Procedure Laterality Date  . CARDIAC CATHETERIZATION N/A 03/15/2016   Procedure: Left Heart Cath and Coronary Angiography;  Surgeon: [name], MD;  Location: [facility];  Service: Cardiovascular;  Laterality: N/A;  . TOTAL KNEE ARTHROPLASTY Right 12/27/2016   Procedure: RIGHT TOTAL KNEE ARTHROPLASTY;  Surgeon: Gaynelle Arabian, MD;  Location: [facility];  Service: Orthopedics;  Laterality: Right;  Marland Kitchen VAGINAL HYSTERECTOMY  1983   endometriosis    Current Medications: Current Meds  Medication Sig  . apixaban (ELIQUIS) 5 MG TABS tablet Take 1 tablet (5 mg total) by mouth 2 (two) times daily.  Marland Kitchen atorvastatin (LIPITOR) 40 MG tablet TAKE 1 TABLET AT 6PM.  . Cholecalciferol (VITAMIN D3) 2000 units TABS Take 1 tablet by mouth daily.  . diclofenac sodium (VOLTAREN) 1 % GEL Apply 2 g topically daily as needed.  . diltiazem (CARDIZEM) 30 MG tablet Take 1 tablet every 4 hours AS NEEDED for AFIB heart rate >100 as long as top number of blood pressure >100.  . gabapentin (NEURONTIN) 300 MG capsule Take 1 capsule by mouth at bedtime.  . hydrochlorothiazide (MICROZIDE) 12.5 MG capsule Take 12.5 mg by mouth daily.  Marland Kitchen levothyroxine (SYNTHROID, LEVOTHROID) 100 MCG tablet Take 100 mcg by  mouth daily before breakfast. TAKE WHOLE TABLET (100 MCG) ALL DAYS EXCEPT ON SUNDAYS TAKE 1/2 TABLET (50 MCG).  Marland Kitchen lisinopril (PRINIVIL,ZESTRIL) 40 MG tablet TAKE 1 TABLET EACH DAY.   Current Facility-Administered Medications for the 03/27/18 encounter (Office Visit) with Sanda Klein, MD  Medication  . 0.9 %  sodium chloride infusion     Allergies:   Patient has no known  allergies.   Social History   Socioeconomic History  . Marital status: Widowed    Spouse name: Not on file  . Number of children: Not on file  . Years of education: Not on file  . Highest education level: Not on file  Occupational History  . Not on file  Social Needs  . Financial resource strain: Not on file  . Food insecurity:    Worry: Not on file    Inability: Not on file  . Transportation needs:    Medical: Not on file    Non-medical: Not on file  Tobacco Use  . Smoking status: Never Smoker  . Smokeless tobacco: Never Used  Substance and Sexual Activity  . Alcohol use: No    Alcohol/week: 0.0 standard drinks  . Drug use: No  . Sexual activity: Never    Birth control/protection: Surgical    Comment: 1st intercourse 77 yo-Fewer than 5 partners  Lifestyle  . Physical activity:    Days per week: Not on file    Minutes per session: Not on file  . Stress: Not on file  Relationships  . Social connections:    Talks on phone: Not on file    Gets together: Not on file    Attends religious service: Not on file    Active member of club or organization: Not on file    Attends meetings of clubs or organizations: Not on file    Relationship status: Not on file  Other Topics Concern  . Not on file  Social History Narrative   [facility]:   Originally from [place]. Moved to [place] in 2012. 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     Family History: The patient's family history includes Heart failure in her father, maternal grandmother, and mother; Myasthenia gravis in her brother; Stroke in her brother. There is no history of Clotting disorder or Colon cancer.  ROS:   Please see the history of present illness.     All other systems reviewed and are negative.  EKGs/Labs/Other Studies Reviewed:    The following studies were reviewed today: Pulmonary embolism protocol CT angiogram of the chest,  echocardiogram  EKG:  EKG is ordered today.  Shows normal sinus rhythm, normal tracing  Recent Labs: 11/24/2017: ALT 12; BUN 14; Creatinine, Ser 0.86; Magnesium 2.3; Potassium 4.9; Sodium 140 01/16/2018: Hemoglobin 12.6; Platelets 198  Recent Lipid Panel    Component Value Date/Time   CHOL 142 03/15/2016 0524   TRIG 73 03/15/2016 0524   HDL 44 03/15/2016 0524   CHOLHDL 3.2 03/15/2016 0524   VLDL 15 03/15/2016 0524   LDLCALC 83 03/15/2016 0524    Physical Exam:    VS:  BP 132/68   Pulse 72   Ht 5\' 3"  (1.6 m)   Wt 185 lb 3.2 oz (84 kg)   BMI 32.81 kg/m     Wt Readings from Last 3 Encounters:  03/27/18 185 lb 3.2 oz (84 kg)  01/16/18 183 lb (83 kg)  11/28/17 186 lb (84.4 kg)  General: Alert, oriented x3, no distress, mildly obese Head: no evidence of trauma, PERRL, EOMI, no exophtalmos or lid lag, no myxedema, no xanthelasma; normal ears, nose and oropharynx Neck: normal jugular venous pulsations and no hepatojugular reflux; brisk carotid pulses without delay and no carotid bruits Chest: clear to auscultation, no signs of consolidation by percussion or palpation, normal fremitus, symmetrical and full respiratory excursions Cardiovascular: normal position and quality of the apical impulse, regular rhythm, normal first and second heart sounds, no murmurs, rubs or gallops Abdomen: no tenderness or distention, no masses by palpation, no abnormal pulsatility or arterial bruits, normal bowel sounds, no hepatosplenomegaly Extremities: no clubbing, cyanosis or edema; 2+ radial, ulnar and brachial pulses bilaterally; 2+ right femoral, posterior tibial and dorsalis pedis pulses; 2+ left femoral, posterior tibial and dorsalis pedis pulses; no subclavian or femoral bruits Neurological: grossly nonfocal Psych: Normal mood and affect   ASSESSMENT:    1. Paroxysmal atrial fibrillation (HCC)   2. Coronary artery disease involving native coronary artery of native heart with angina pectoris  with documented spasm ([place])   3. History of pulmonary embolism   4. Essential hypertension   5. Hypercholesterolemia    PLAN:    In order of problems listed above:  1. PAFib: CHA2DS2-Vasc score 5 (age, CAD, HTN, female).  Asymptomatic.  Antiarrhythmics are not indicated.  We are avoiding AV blocking agents due to history of bradycardia and nocturnal pauses when she was taking beta-blockers in the past. 2. CAD: Mild lesion seen on previous catheterization in 2017, not on aspirin due to full anticoagulation, taking a statin with target LDL less than 70.  Suspected of having coronary vasospasm, but currently asymptomatic. 3. Hx of PE: Will be on anticoagulation for her atrial fibrillation anyway. 4. HTN: Well-controlled. 5. HLP: Seems to be tolerating statin now without complaints.   Patient Instructions  Medication Instructions:  Dr Sallyanne Kuster recommends that you continue on your current medications as directed. Please refer to the Current Medication list given to you today.  If you need a refill on your cardiac medications before your next appointment, please call your pharmacy.   Follow-Up: At [facility], you and your health needs are our priority.  As part of our continuing mission to provide you with exceptional heart care, we have created designated Provider Care Teams.  These Care Teams include your primary Cardiologist (physician) and Advanced Practice Providers (APPs -  Physician Assistants and Nurse Practitioners) who all work together to provide you with the care you need, when you need it. You will need a follow up appointment in 12 months.  Please call our office 2 months in advance to schedule this appointment.  You may see Sanda Klein, MD or one of the following Advanced Practice Providers on your designated Care Team: [place], [place] . Fabian Sharp, PA-C . You will receive a reminder letter in the mail two months in advance. If you don't receive a letter, please call our  office to schedule the follow-up appointment.    Medication Adjustments/Labs and Tests Ordered: Current medicines are reviewed at length with the patient today.  Concerns regarding medicines are outlined above.  No orders of the defined types were placed in this encounter.  No orders of the defined types were placed in this encounter.   Signed, Sanda Klein, MD  03/28/2018 5:30 PM    [place]

## 2018-03-28 DIAGNOSIS — I480 Paroxysmal atrial fibrillation: Secondary | ICD-10-CM | POA: Insufficient documentation

## 2018-03-28 DIAGNOSIS — Z86711 Personal history of pulmonary embolism: Secondary | ICD-10-CM | POA: Insufficient documentation

## 2018-04-05 DIAGNOSIS — Z6832 Body mass index (BMI) 32.0-32.9, adult: Secondary | ICD-10-CM | POA: Diagnosis not present

## 2018-04-05 DIAGNOSIS — I480 Paroxysmal atrial fibrillation: Secondary | ICD-10-CM | POA: Diagnosis not present

## 2018-04-05 DIAGNOSIS — I10 Essential (primary) hypertension: Secondary | ICD-10-CM | POA: Diagnosis not present

## 2018-04-05 DIAGNOSIS — I700 Atherosclerosis of aorta: Secondary | ICD-10-CM | POA: Diagnosis not present

## 2018-04-05 DIAGNOSIS — I25119 Atherosclerotic heart disease of native coronary artery with unspecified angina pectoris: Secondary | ICD-10-CM | POA: Diagnosis not present

## 2018-04-16 ENCOUNTER — Other Ambulatory Visit: Payer: Self-pay | Admitting: Cardiovascular Disease

## 2018-06-28 ENCOUNTER — Other Ambulatory Visit (HOSPITAL_COMMUNITY): Payer: Self-pay | Admitting: Nurse Practitioner

## 2018-06-28 ENCOUNTER — Other Ambulatory Visit: Payer: Self-pay | Admitting: Cardiovascular Disease

## 2018-07-12 DIAGNOSIS — Z86711 Personal history of pulmonary embolism: Secondary | ICD-10-CM | POA: Diagnosis not present

## 2018-07-12 DIAGNOSIS — E890 Postprocedural hypothyroidism: Secondary | ICD-10-CM | POA: Diagnosis not present

## 2018-07-12 DIAGNOSIS — E7849 Other hyperlipidemia: Secondary | ICD-10-CM | POA: Diagnosis not present

## 2018-07-12 DIAGNOSIS — I10 Essential (primary) hypertension: Secondary | ICD-10-CM | POA: Diagnosis not present

## 2018-07-12 DIAGNOSIS — Z7901 Long term (current) use of anticoagulants: Secondary | ICD-10-CM | POA: Diagnosis not present

## 2018-07-12 DIAGNOSIS — I4891 Unspecified atrial fibrillation: Secondary | ICD-10-CM | POA: Diagnosis not present

## 2018-07-12 DIAGNOSIS — E0500 Thyrotoxicosis with diffuse goiter without thyrotoxic crisis or storm: Secondary | ICD-10-CM | POA: Diagnosis not present

## 2018-07-12 DIAGNOSIS — I2510 Atherosclerotic heart disease of native coronary artery without angina pectoris: Secondary | ICD-10-CM | POA: Diagnosis not present

## 2018-08-16 ENCOUNTER — Non-Acute Institutional Stay: Payer: PPO | Admitting: Internal Medicine

## 2018-08-16 ENCOUNTER — Encounter: Payer: Self-pay | Admitting: Internal Medicine

## 2018-08-16 ENCOUNTER — Other Ambulatory Visit: Payer: Self-pay

## 2018-08-16 VITALS — BP 142/88 | HR 56 | Temp 98.3°F | Ht 65.0 in | Wt 187.0 lb

## 2018-08-16 DIAGNOSIS — I480 Paroxysmal atrial fibrillation: Secondary | ICD-10-CM

## 2018-08-16 DIAGNOSIS — E038 Other specified hypothyroidism: Secondary | ICD-10-CM

## 2018-08-16 DIAGNOSIS — I10 Essential (primary) hypertension: Secondary | ICD-10-CM

## 2018-08-16 DIAGNOSIS — I25111 Atherosclerotic heart disease of native coronary artery with angina pectoris with documented spasm: Secondary | ICD-10-CM | POA: Diagnosis not present

## 2018-08-16 DIAGNOSIS — L719 Rosacea, unspecified: Secondary | ICD-10-CM | POA: Diagnosis not present

## 2018-08-16 MED ORDER — MELOXICAM 7.5 MG PO TABS: ORAL_TABLET | ORAL | 0 refills | Status: DC

## 2018-08-16 MED ORDER — METRONIDAZOLE 0.75 % EX CREA: TOPICAL_CREAM | Freq: Two times a day (BID) | CUTANEOUS | Status: DC

## 2018-08-16 NOTE — Progress Notes (Signed)
Location: [place] of Service:  Clinic (12)  Provider:   Code Status:  Goals of Care:  Advanced Directives 08/16/2018  Does Patient Have a Medical Advance Directive? Yes  Type of Paramedic of [place];Living will  Does patient want to make changes to medical advance directive? No - Patient declined  Copy of [place] in Chart? No - copy requested  Would patient like information on creating a medical advance directive? -     Chief Complaint  Patient presents with  . Acute Visit    Rash on face for 4-5 months that stings     HPI: Patient is a 78 y.o. female seen today for an acute visit for Rash and Right knee pain  Patient is a new patient for our practice.  She follows with Dr. Forde Dandy he states that she has moved to friend's home and wanted to establish care with [place].  Has a past medical history of PE complicated by cardiorespiratory arrest while on estrogen therapy in 2017 history of paroxysmal A. fib status post cardioversion in 08/19, coronary CAD, hypertension, hyperlipidemia, history of sinus node dysfunction cannot take scheduled AV blocking agents, normal EF on echo and normal PFT in 2018, h/o right knee replacement, Hypothyroid  Patient main complain today was rash on her face . It started on her nose.  And then spread all over her face mostly around her cheeks but also on her forehead and chin.  She states that is been like this for now 4 to 5 months.  She initially used alcohol to clean it and it got worse.  Its tinges but does not hurt or itch. Her other complaint is a chronic knee pain.  Patient states that she had a knee replacement done few years ago and has seen orthopedics for continues pain specially after prolong walks.  She was told she has tendinitis and she wanted to know if there is something she can take for that  She also has  hot  flashes.  She was on chronic estrogen therapy till she was 51 when she  was taken off due to PE Patient lives in an independent apartment and is very active, does not need any assistive device to walk.  Has not had any falls recently and is still driving  Past Medical History:  Diagnosis Date  . Arthritis   . Coronary artery disease 03/16/2016   a. admx with angina and mildly elevated Troponin >> LHC: mLAD 30 >> CCB started for poss microvascular angina  . Diverticulosis   . Hyperlipidemia 03/16/2016  . Hypertension   . PE (pulmonary thromboembolism) ([place]) 03/17/2016   Bilateral submassive pulmonary embolus with RUE and RLE DVT on venous duplex 02/2016 c/b PEA arrest // Echo 03/21/16: mild LVH, EF 60-65, no RWMA, Gr 1 DD, normal RVSF  . Sinus bradycardia 03/31/2016  . Thrombocytopenia ([place]) 02/2016   Profound thrombocytopenia noted upon admission for pulmonary embolism >> initially thought to be from Hep induced thrombocytopenia given recent admit for cardiac cath and Heparin use // However, HIT Ab was neg (0.184) // Hematology consult pending  . Thyroid disease   . Vascular malformation    mid ascending colon    Past Surgical History:  Procedure Laterality Date  . CARDIAC CATHETERIZATION N/A 03/15/2016   Procedure: Left Heart Cath and Coronary Angiography;  Surgeon: [name], MD;  Location: [facility];  Service: Cardiovascular;  Laterality: N/A;  . TOTAL  KNEE ARTHROPLASTY Right 12/27/2016   Procedure: RIGHT TOTAL KNEE ARTHROPLASTY;  Surgeon: Gaynelle Arabian, MD;  Location: [facility];  Service: Orthopedics;  Laterality: Right;  Marland Kitchen VAGINAL HYSTERECTOMY  1983   endometriosis    No Known Allergies  Outpatient Encounter Medications as of 08/16/2018  Medication Sig  . atorvastatin (LIPITOR) 40 MG tablet TAKE 1 TABLET AT 6PM.  . Cholecalciferol (VITAMIN D3) 2000 units TABS Take 1 tablet by mouth daily.  Marland Kitchen diltiazem (CARDIZEM) 30 MG tablet Take 1 tablet every 4 hours AS NEEDED for AFIB heart rate >100 as long as top number of blood pressure >100.  Marland Kitchen  ELIQUIS 5 MG TABS tablet TAKE 1 TABLET BY MOUTH TWICE DAILY.  Marland Kitchen gabapentin (NEURONTIN) 300 MG capsule Take 1 capsule by mouth at bedtime.  . hydrochlorothiazide (MICROZIDE) 12.5 MG capsule Take 12.5 mg by mouth daily.  Marland Kitchen levothyroxine (SYNTHROID, LEVOTHROID) 100 MCG tablet Take 100 mcg by mouth daily before breakfast. TAKE WHOLE TABLET (100 MCG) ALL DAYS EXCEPT ON SUNDAYS TAKE 1/2 TABLET (50 MCG).  Marland Kitchen lisinopril (PRINIVIL,ZESTRIL) 40 MG tablet TAKE 1 TABLET EACH DAY.  . meloxicam (MOBIC) 7.5 MG tablet Use it PRN for Right Knee pain  . [DISCONTINUED] diclofenac sodium (VOLTAREN) 1 % GEL Apply 2 g topically daily as needed.  . [DISCONTINUED] meloxicam (MOBIC) 7.5 MG tablet Use it PRN for Right Knee pain   Facility-Administered Encounter Medications as of 08/16/2018  Medication  . 0.9 %  sodium chloride infusion  . metroNIDAZOLE (METROCREAM) 0.75 % cream    Review of Systems:  Review of Systems  Constitutional: Negative.   HENT: Negative.   Respiratory: Negative.   Cardiovascular: Negative.   Gastrointestinal: Negative.   Genitourinary: Negative.   Musculoskeletal: Positive for arthralgias and myalgias.  Skin: Positive for rash.  Neurological: Negative.   Psychiatric/Behavioral: Negative.   All other systems reviewed and are negative.   Health Maintenance  Topic Date Due  . TETANUS/TDAP  10/24/1959  . PNA vac Low Risk Adult (1 of 2 - PCV13) 10/23/2005  . INFLUENZA VACCINE  11/18/2018  . DEXA SCAN  Completed    Physical Exam: Vitals:   08/16/18 1618  BP: (!) 142/88  Pulse: (!) 56  Temp: 98.3 F (36.8 C)  TempSrc: Oral  SpO2: 97%  Weight: 187 lb (84.8 kg)  Height: 5' 5"  (1.651 m)   Body mass index is 31.12 kg/m. Physical Exam Vitals signs reviewed.  Constitutional:      Appearance: Normal appearance.  HENT:     Head: Normocephalic.     Comments: Has redness around nose, Forehead More on Cheeks.      Nose: Nose normal.     Mouth/Throat:     Mouth: Mucous  membranes are moist.     Pharynx: Oropharynx is clear.  Eyes:     Pupils: Pupils are equal, round, and reactive to light.  Cardiovascular:     Rate and Rhythm: Normal rate and regular rhythm.     Pulses: Normal pulses.     Heart sounds: Normal heart sounds. No murmur.  Pulmonary:     Effort: Pulmonary effort is normal.     Breath sounds: Normal breath sounds.  Abdominal:     General: Abdomen is flat. Bowel sounds are normal.     Palpations: Abdomen is soft.  Musculoskeletal:        General: No swelling.  Skin:    General: Skin is warm and dry.  Neurological:     General: No focal deficit  present.     Mental Status: She is alert and oriented to person, place, and time.  Psychiatric:        Mood and Affect: Mood normal.        Thought Content: Thought content normal.        Judgment: Judgment normal.     Labs reviewed: Basic Metabolic Panel: Recent Labs    11/24/17 0230  NA 140  K 4.9  CL 101  CO2 28  GLUCOSE 113*  BUN 14  CREATININE 0.86  CALCIUM 9.2  MG 2.3   Liver Function Tests: Recent Labs    11/24/17 0230  AST 35  ALT 12  ALKPHOS 77  BILITOT 1.1  PROT 6.3*  ALBUMIN 3.6   No results for input(s): LIPASE, AMYLASE in the last 8760 hours. No results for input(s): AMMONIA in the last 8760 hours. CBC: Recent Labs    11/24/17 0230 01/16/18 1624  WBC 6.4 5.6  HGB 12.8 12.6  HCT 39.4 37.7  MCV 96.1 93  PLT 175 198   Lipid Panel: No results for input(s): CHOL, HDL, LDLCALC, TRIG, CHOLHDL, LDLDIRECT in the last 8760 hours. Lab Results  Component Value Date   HGBA1C 5.5 03/14/2016    Procedures since last visit: No results found.  Assessment/Plan  Paroxysmal atrial fibrillation (HCC) Cannot tolerate any AV nodal agent due to bradycardia On Cardizem PRN Follows Closely with cardiology On Eliquis  Essential hypertension - Plan: CMP with eGFR([facility]), Lipid Panel On HCTZ  Coronary artery disease  On Statin Cannot tolerate Beta blcoker    - Plan: CBC with Differential/Platelet, Lipid Panel  Other specified hypothyroidism  Follows closely with Dr Forde Dandy On Replacement- Plan: TSH  Rosacea Will start her on Metrogel Avoid washing face with harsh soaps Sunscreen when goes outside Right Knee pain Chronic Will start on Mobic 7.5 mg PRN  #15 tabs  Health maintenance Will need Repeat DEXA scan, Colonoscopy ? And Mammogram ?   Labs/tests ordered:  @ORDERS @ Next appt:  11/09/2018

## 2018-08-17 ENCOUNTER — Other Ambulatory Visit: Payer: Self-pay | Admitting: Internal Medicine

## 2018-08-17 ENCOUNTER — Telehealth: Payer: Self-pay | Admitting: *Deleted

## 2018-08-17 MED ORDER — METRONIDAZOLE 0.75 % EX CREA: TOPICAL_CREAM | Freq: Two times a day (BID) | CUTANEOUS | Status: DC

## 2018-08-17 MED ORDER — MELOXICAM 7.5 MG PO TABS: ORAL_TABLET | ORAL | 0 refills | Status: DC

## 2018-08-17 NOTE — Telephone Encounter (Signed)
Virgie Dad, MD  You 22 minutes ago (3:05 PM)    I called it in    Routing comment        Patient notified.

## 2018-08-17 NOTE — Telephone Encounter (Signed)
Patient called and stated that she was seen yesterday and was suppose to get a cream for her face for redness. Stated that the pharmacy never received a rx for cream. Please Advise.   Pharmacy: [facility]

## 2018-08-20 MED ORDER — METRONIDAZOLE 0.75 % EX CREA: TOPICAL_CREAM | Freq: Two times a day (BID) | CUTANEOUS | Status: DC

## 2018-08-30 ENCOUNTER — Other Ambulatory Visit: Payer: Self-pay | Admitting: Family Medicine

## 2018-08-30 DIAGNOSIS — Z1231 Encounter for screening mammogram for malignant neoplasm of breast: Secondary | ICD-10-CM

## 2018-09-29 DIAGNOSIS — L661 Lichen planopilaris: Secondary | ICD-10-CM | POA: Diagnosis not present

## 2018-09-29 DIAGNOSIS — L821 Other seborrheic keratosis: Secondary | ICD-10-CM | POA: Diagnosis not present

## 2018-09-29 DIAGNOSIS — L718 Other rosacea: Secondary | ICD-10-CM | POA: Diagnosis not present

## 2018-09-29 DIAGNOSIS — Q828 Other specified congenital malformations of skin: Secondary | ICD-10-CM | POA: Diagnosis not present

## 2018-10-04 DIAGNOSIS — I480 Paroxysmal atrial fibrillation: Secondary | ICD-10-CM | POA: Diagnosis not present

## 2018-10-04 DIAGNOSIS — I10 Essential (primary) hypertension: Secondary | ICD-10-CM | POA: Diagnosis not present

## 2018-10-04 DIAGNOSIS — I700 Atherosclerosis of aorta: Secondary | ICD-10-CM | POA: Diagnosis not present

## 2018-10-04 DIAGNOSIS — E890 Postprocedural hypothyroidism: Secondary | ICD-10-CM | POA: Diagnosis not present

## 2018-10-12 ENCOUNTER — Telehealth: Payer: Self-pay | Admitting: *Deleted

## 2018-10-12 MED ORDER — ROSUVASTATIN CALCIUM 20 MG PO TABS: 20.0000 mg | ORAL_TABLET | Freq: Every day | ORAL | 3 refills | Status: DC

## 2018-10-12 NOTE — Telephone Encounter (Signed)
-----   Message from Sanda Klein, MD sent at 10/12/2018 10:06 AM EDT ----- Please send in Rx for rosuvastatin 20 mg daily, #90, RF3. Instead of atorvastatin

## 2018-10-12 NOTE — Telephone Encounter (Signed)
New script sent to the pharmacy

## 2018-10-15 ENCOUNTER — Other Ambulatory Visit: Payer: Self-pay | Admitting: Cardiovascular Disease

## 2018-10-16 NOTE — Telephone Encounter (Signed)
T IS A 77YOF REQUESTING ELIQUIS 5MG . WT 84KG, SCR 0.86(11/24/17), LOVW/CROITORU(03/27/18)

## 2018-10-26 ENCOUNTER — Ambulatory Visit
Admission: RE | Admit: 2018-10-26 | Discharge: 2018-10-26 | Disposition: A | Discharge status: Home / Self Care | Payer: PPO | Source: Ambulatory Visit | Attending: Family Medicine | Admitting: Family Medicine

## 2018-10-26 ENCOUNTER — Other Ambulatory Visit: Payer: Self-pay

## 2018-10-26 DIAGNOSIS — Z1231 Encounter for screening mammogram for malignant neoplasm of breast: Secondary | ICD-10-CM | POA: Diagnosis not present

## 2018-11-09 ENCOUNTER — Other Ambulatory Visit: Payer: Self-pay

## 2018-11-15 ENCOUNTER — Encounter: Payer: Self-pay | Admitting: Internal Medicine

## 2018-11-16 ENCOUNTER — Other Ambulatory Visit: Payer: Self-pay

## 2018-11-22 ENCOUNTER — Encounter: Payer: Self-pay | Admitting: Internal Medicine

## 2018-12-22 DIAGNOSIS — Z23 Encounter for immunization: Secondary | ICD-10-CM | POA: Diagnosis not present

## 2019-01-04 DIAGNOSIS — H903 Sensorineural hearing loss, bilateral: Secondary | ICD-10-CM | POA: Diagnosis not present

## 2019-01-04 DIAGNOSIS — Z822 Family history of deafness and hearing loss: Secondary | ICD-10-CM | POA: Diagnosis not present

## 2019-01-04 DIAGNOSIS — H9313 Tinnitus, bilateral: Secondary | ICD-10-CM | POA: Diagnosis not present

## 2019-01-10 DIAGNOSIS — D126 Benign neoplasm of colon, unspecified: Secondary | ICD-10-CM | POA: Diagnosis not present

## 2019-01-10 DIAGNOSIS — Z86711 Personal history of pulmonary embolism: Secondary | ICD-10-CM | POA: Diagnosis not present

## 2019-01-10 DIAGNOSIS — I10 Essential (primary) hypertension: Secondary | ICD-10-CM | POA: Diagnosis not present

## 2019-01-10 DIAGNOSIS — Z7901 Long term (current) use of anticoagulants: Secondary | ICD-10-CM | POA: Diagnosis not present

## 2019-01-10 DIAGNOSIS — E890 Postprocedural hypothyroidism: Secondary | ICD-10-CM | POA: Diagnosis not present

## 2019-01-10 DIAGNOSIS — I700 Atherosclerosis of aorta: Secondary | ICD-10-CM | POA: Diagnosis not present

## 2019-01-10 DIAGNOSIS — I2510 Atherosclerotic heart disease of native coronary artery without angina pectoris: Secondary | ICD-10-CM | POA: Diagnosis not present

## 2019-01-10 DIAGNOSIS — K760 Fatty (change of) liver, not elsewhere classified: Secondary | ICD-10-CM | POA: Diagnosis not present

## 2019-01-10 DIAGNOSIS — E0500 Thyrotoxicosis with diffuse goiter without thyrotoxic crisis or storm: Secondary | ICD-10-CM | POA: Diagnosis not present

## 2019-01-10 DIAGNOSIS — E785 Hyperlipidemia, unspecified: Secondary | ICD-10-CM | POA: Diagnosis not present

## 2019-01-10 DIAGNOSIS — I4891 Unspecified atrial fibrillation: Secondary | ICD-10-CM | POA: Diagnosis not present

## 2019-01-11 DIAGNOSIS — E890 Postprocedural hypothyroidism: Secondary | ICD-10-CM | POA: Diagnosis not present

## 2019-01-11 DIAGNOSIS — E7849 Other hyperlipidemia: Secondary | ICD-10-CM | POA: Diagnosis not present

## 2019-01-12 ENCOUNTER — Other Ambulatory Visit: Payer: Self-pay | Admitting: Cardiovascular Disease

## 2019-01-12 DIAGNOSIS — E890 Postprocedural hypothyroidism: Secondary | ICD-10-CM | POA: Diagnosis not present

## 2019-01-12 DIAGNOSIS — I25119 Atherosclerotic heart disease of native coronary artery with unspecified angina pectoris: Secondary | ICD-10-CM | POA: Diagnosis not present

## 2019-01-12 DIAGNOSIS — I10 Essential (primary) hypertension: Secondary | ICD-10-CM | POA: Diagnosis not present

## 2019-01-12 DIAGNOSIS — I480 Paroxysmal atrial fibrillation: Secondary | ICD-10-CM | POA: Diagnosis not present

## 2019-01-12 NOTE — Telephone Encounter (Signed)
Scr 0.8 07/12/2018 - [facility]

## 2019-01-25 ENCOUNTER — Encounter: Payer: Self-pay | Admitting: Gynecology

## 2019-01-31 DIAGNOSIS — H524 Presbyopia: Secondary | ICD-10-CM | POA: Diagnosis not present

## 2019-01-31 DIAGNOSIS — H5212 Myopia, left eye: Secondary | ICD-10-CM | POA: Diagnosis not present

## 2019-01-31 DIAGNOSIS — H5201 Hypermetropia, right eye: Secondary | ICD-10-CM | POA: Diagnosis not present

## 2019-01-31 DIAGNOSIS — H52202 Unspecified astigmatism, left eye: Secondary | ICD-10-CM | POA: Diagnosis not present

## 2019-01-31 DIAGNOSIS — Z961 Presence of intraocular lens: Secondary | ICD-10-CM | POA: Diagnosis not present

## 2019-02-05 DIAGNOSIS — H903 Sensorineural hearing loss, bilateral: Secondary | ICD-10-CM | POA: Diagnosis not present

## 2019-02-21 DIAGNOSIS — E669 Obesity, unspecified: Secondary | ICD-10-CM | POA: Diagnosis not present

## 2019-02-26 ENCOUNTER — Encounter (INDEPENDENT_AMBULATORY_CARE_PROVIDER_SITE_OTHER): Payer: Self-pay | Admitting: Bariatrics

## 2019-02-26 ENCOUNTER — Ambulatory Visit (INDEPENDENT_AMBULATORY_CARE_PROVIDER_SITE_OTHER): Payer: PPO | Admitting: Bariatrics

## 2019-02-26 ENCOUNTER — Other Ambulatory Visit: Payer: Self-pay

## 2019-02-26 ENCOUNTER — Encounter: Payer: Self-pay | Admitting: Bariatrics

## 2019-02-26 VITALS — BP 139/87 | HR 55 | Temp 98.5°F | Ht 62.0 in | Wt 171.0 lb

## 2019-02-26 DIAGNOSIS — R5383 Other fatigue: Secondary | ICD-10-CM | POA: Diagnosis not present

## 2019-02-26 DIAGNOSIS — E559 Vitamin D deficiency, unspecified: Secondary | ICD-10-CM | POA: Diagnosis not present

## 2019-02-26 DIAGNOSIS — I5022 Chronic systolic (congestive) heart failure: Secondary | ICD-10-CM | POA: Diagnosis not present

## 2019-02-26 DIAGNOSIS — E669 Obesity, unspecified: Secondary | ICD-10-CM | POA: Diagnosis not present

## 2019-02-26 DIAGNOSIS — Z0289 Encounter for other administrative examinations: Secondary | ICD-10-CM

## 2019-02-26 DIAGNOSIS — E7849 Other hyperlipidemia: Secondary | ICD-10-CM | POA: Diagnosis not present

## 2019-02-26 DIAGNOSIS — F3289 Other specified depressive episodes: Secondary | ICD-10-CM

## 2019-02-26 DIAGNOSIS — R739 Hyperglycemia, unspecified: Secondary | ICD-10-CM

## 2019-02-26 DIAGNOSIS — Z6831 Body mass index (BMI) 31.0-31.9, adult: Secondary | ICD-10-CM | POA: Diagnosis not present

## 2019-02-26 DIAGNOSIS — R0602 Shortness of breath: Secondary | ICD-10-CM | POA: Diagnosis not present

## 2019-02-26 DIAGNOSIS — I10 Essential (primary) hypertension: Secondary | ICD-10-CM | POA: Diagnosis not present

## 2019-02-26 NOTE — Progress Notes (Signed)
.  Office: [phone]  /  Fax: [phone]   HPI:   Chief Complaint: OBESITY  Michele Thompson (MR# [id number]) is a 78 y.o. female who presents on 02/26/2019 for obesity evaluation and treatment. Current BMI is Body mass index is 31.28 kg/m.Marland Kitchen Jazzma has struggled with obesity for years and has been unsuccessful in either losing weight or maintaining long term weight loss. Laticia attended our information session and states she is currently in the action stage of change and ready to dedicate time achieving and maintaining a healthier weight.   Shaleah does not like to cook. She craves chocolate and chips. She states that she likes most foods. She lives in independent living and her meals are brought to her.  Brennley states she started gaining weight at age 21 her heaviest weight ever was 212 lbs. she has significant food cravings issues  she skips evening dinner frequently she maybe makes poor food choices she has binge eating behaviors she struggles with emotional eating    Fatigue Foye feels her energy is lower than it should be. This has worsened with weight gain and has worsened recently. Glendola admits to daytime somnolence and admits to waking up still tired. Patient is at risk for obstructive sleep apnea. Patent has a history of symptoms of daytime fatigue. Patient generally gets 7-8 hours of sleep per night, and states they generally have restful sleep if they take Gabapentin. Snoring is present. Apneic episodes are not present. Epworth Sleepiness Score is 18.  Dyspnea on exertion Saphyra notes increasing shortness of breath with exercising and seems to be worsening over time with weight gain. She notes getting out of breath sooner with activity than she used to. This has gotten worse recently. Emma denies orthopnea.  Chronic Systolic Congestive Heart Failure Meenu has a diagnosis of chronic systolic CHF and is taking Entresto, Lasix, and Aldactone. She underwent an echo in February 2019 which showed an LVEF  of 60-65%.  Hypertension LAKETHA FU is a 78 y.o. female with hypertension and is taking HCTZ, lisinopril, and diltiazem. [name] denies chest pain or shortness of breath on exertion. She is working weight loss to help control her blood pressure with the goal of decreasing her risk of heart attack and stroke. Xochil's blood pressure is reasonably well controlled.  Hyperlipidemia Nazyia has hyperlipidemia and is taking Crestor. She has been trying to improve her cholesterol levels with intensive lifestyle modification including a low saturated fat diet, exercise and weight loss. She denies any chest pain, claudication or myalgias.  Hyperglycemia (Elevated Glucose) Drewcilla has a history of some elevated blood glucose readings without a diagnosis of diabetes. She reports an increased hunger for sweets.  Vitamin D deficiency Shaina has a diagnosis of Vitamin D deficiency. She is currently taking OTC Vit D 1,000 mg and denies nausea, vomiting or muscle weakness.  Depression with emotional eating behaviors Ariel is struggling with emotional eating and using food for comfort to the extent that it is negatively impacting her health. She often snacks when she is not hungry. Sinda sometimes feels she is out of control and then feels guilty that she made poor food choices. She has been working on behavior modification techniques to help reduce her emotional eating and has been somewhat successful. Hodaya's PHQ-9 score is 22 today. She reports stress eating. She shows no sign of suicidal or homicidal ideations.  Depression Screen Brentley's Food and Mood (modified PHQ-9) score was 22. Depression screen [facility] 2/9 02/26/2019  Decreased  Interest 3  Down, Depressed, Hopeless 3  PHQ - 2 Score 6  Altered sleeping 3  Tired, decreased energy 3  Change in appetite 2  Feeling bad or failure about yourself  3  Trouble concentrating 3  Moving slowly or fidgety/restless 2  Suicidal thoughts 0  PHQ-9 Score 22  Difficult doing  work/chores Very difficult    ASSESSMENT AND PLAN:  Other fatigue - Plan: EKG 12-Lead  SOB (shortness of breath) on exertion - Plan: Lipid Panel With LDL/HDL Ratio  Chronic systolic congestive heart failure (HCC)  Essential hypertension  Other hyperlipidemia  Hyperglycemia - Plan: Comprehensive metabolic panel, Hemoglobin A1c, Insulin, random  Vitamin D deficiency - Plan: Vitamin D (25 hydroxy)  Other depression - WITH EMOTIONAL EATING  Class 1 obesity with serious comorbidity and body mass index (BMI) of 31.0 to 31.9 in adult, unspecified obesity type  PLAN:  Fatigue Azarya was informed that her fatigue may be related to obesity, depression or many other causes. Labs will be ordered, and in the meanwhile Shawnalee has agreed to work on diet, exercise and weight loss to help with fatigue. Proper sleep hygiene was discussed including the need for 7-8 hours of quality sleep each night. A sleep study was not ordered based on symptoms and Epworth score. Jerrye was instructed to gradually increase activities.  Dyspnea on exertion Amberrose's shortness of breath appears to be obesity related and exercise induced. She has agreed to work on weight loss and gradually increase exercise to treat her exercise induced shortness of breath. If Dilynn follows our instructions and loses weight without improvement of her shortness of breath, we will plan to refer to pulmonology. We will monitor this condition regularly. Kaylan agrees to this plan.  Chronic Systolic Congestive Heart Failure Laurelee was advised to continue her medications and follow-up as directed in 2-3 weeks to monitor her progress.  Hypertension We discussed sodium restriction, working on healthy weight loss, and a regular exercise program as the means to achieve improved blood pressure control. Jesiah agreed with this plan and agreed to follow up as directed. We will continue to monitor her blood pressure as well as her progress with the above lifestyle  modifications. She will continue her medications as prescribed and will watch for signs of hypotension as she continues her lifestyle modifications.  Hyperlipidemia Kalina was informed of the American Heart Association Guidelines emphasizing intensive lifestyle modifications as the first line treatment for hyperlipidemia. We discussed many lifestyle modifications today in depth, and Marvel will continue to work on decreasing saturated fats such as fatty red meat, butter and many fried foods. She will also continue her medications, increase vegetables and lean protein in her diet, and continue to work on exercise and weight loss efforts.  Hyperglycemia (Elevated Glucose) Fasting labs will be obtained (A1c and insulin) and results with be discussed with Lelon Frohlich in 2-3 weeks at her follow-up visit. In the meanwhile Roizy was started on a lower simple carbohydrate diet and will work on weight loss efforts.  Vitamin D Deficiency Miona was informed that low Vitamin D levels contributes to fatigue and are associated with obesity, breast, and colon cancer. She will have routine testing of Vitamin D today and follow-up with our clinic in 2-3 weeks.  Depression with Emotional Eating Behaviors We discussed behavior modification techniques today to help Torah deal with her emotional eating and depression. Crystalrose will be referred to Dr. Mallie Mussel, our bariatric psychologist, for evaluation.  Depression Screen Karalyn had a strongly positive depression  screening. Depression is commonly associated with obesity and often results in emotional eating behaviors. We will monitor this closely and work on CBT to help improve the non-hunger eating patterns.   Obesity Valeria is currently in the action stage of change and her goal is to continue with weight loss efforts She has agreed to follow the Category 1 plan. Mellissia will work on meal planning, increasing her protein intake, and will not skip meals. She was given sheet on Microwave meals. Billijo has  been instructed to work up to a goal of 150 minutes of combined cardio and strengthening exercise per week for weight loss and overall health benefits. We discussed the following Behavioral Modification Stratagies today: increasing lean protein intake, decreasing simple carbohydrates, increasing vegetables, increase H20 intake, decrease eating out, no skipping meals, work on meal planning and easy cooking plans, keeping healthy foods in the home, and planning for success.  Kimiyah has agreed to follow-up with our clinic in 2-3 weeks. She was informed of the importance of frequent follow-up visits to maximize her success with intensive lifestyle modifications for her multiple health conditions. She was informed we would discuss her lab results at her next visit unless there is a critical issue that needs to be addressed sooner. Lundon agreed to keep her next visit at the agreed upon time to discuss these results.  ALLERGIES: No Known Allergies  MEDICATIONS: Current Outpatient Medications on File Prior to Visit  Medication Sig Dispense Refill  . betamethasone dipropionate 0.05 % lotion Apply topically 2 (two) times daily.    . Cholecalciferol (VITAMIN D3) 2000 units TABS Take 1 tablet by mouth daily.    Marland Kitchen doxycycline (VIBRAMYCIN) 50 MG capsule Take 50 mg by mouth daily.    Marland Kitchen ELIQUIS 5 MG TABS tablet TAKE 1 TABLET BY MOUTH TWICE DAILY. 180 tablet 0  . gabapentin (NEURONTIN) 300 MG capsule Take 1 capsule by mouth at bedtime.    . hydrochlorothiazide (MICROZIDE) 12.5 MG capsule Take 12.5 mg by mouth daily.    Marland Kitchen levothyroxine (SYNTHROID, LEVOTHROID) 100 MCG tablet Take 100 mcg by mouth daily before breakfast. TAKE WHOLE TABLET (100 MCG) ALL DAYS EXCEPT ON SUNDAYS TAKE 1/2 TABLET (50 MCG).    Marland Kitchen lisinopril (PRINIVIL,ZESTRIL) 40 MG tablet TAKE 1 TABLET EACH DAY. 90 tablet 3  . rosuvastatin (CRESTOR) 20 MG tablet Take 1 tablet (20 mg total) by mouth daily. 90 tablet 3  . diltiazem (CARDIZEM) 30 MG tablet Take 1  tablet every 4 hours AS NEEDED for AFIB heart rate >100 as long as top number of blood pressure >100. 45 tablet 1  . meloxicam (MOBIC) 7.5 MG tablet Use it PRN for Right Knee pain 15 tablet 0   Current Facility-Administered Medications on File Prior to Visit  Medication Dose Route Frequency Provider Last Rate Last Dose  . 0.9 %  sodium chloride infusion  500 mL Intravenous Once Nandigam, Kavitha V, MD      . metroNIDAZOLE (METROCREAM) 0.75 % cream   Topical BID Virgie Dad, MD        PAST MEDICAL HISTORY: Past Medical History:  Diagnosis Date  . Anxiety   . Aortic atherosclerosis ([place])   . Arthritis   . Constipation   . Coronary artery disease 03/16/2016   a. admx with angina and mildly elevated Troponin >> LHC: mLAD 30 >> CCB started for poss microvascular angina  . Depression   . Diverticulosis   . Edema of both lower extremities   . Fatty liver   .  History of MI (myocardial infarction)   . Hyperlipidemia 03/16/2016  . Hypertension   . Hypothyroidism   . Joint pain   . Paroxysmal A-fib ([place])   . PE (pulmonary thromboembolism) ([place]) 03/17/2016   Bilateral submassive pulmonary embolus with RUE and RLE DVT on venous duplex 02/2016 c/b PEA arrest // Echo 03/21/16: mild LVH, EF 60-65, no RWMA, Gr 1 DD, normal RVSF  . Psoas tendinitis   . Sinus bradycardia 03/31/2016  . Thrombocytopenia ([place]) 02/2016   Profound thrombocytopenia noted upon admission for pulmonary embolism >> initially thought to be from Hep induced thrombocytopenia given recent admit for cardiac cath and Heparin use // However, HIT Ab was neg (0.184) // Hematology consult pending  . Thyroid disease   . Unilateral primary osteoarthritis, right knee   . Vascular malformation    mid ascending colon  . Vitamin D deficiency     PAST SURGICAL HISTORY: Past Surgical History:  Procedure Laterality Date  . CARDIAC CATHETERIZATION N/A 03/15/2016   Procedure: Left Heart Cath and Coronary Angiography;  Surgeon: [name], MD;  Location: [facility];  Service: Cardiovascular;  Laterality: N/A;  . TOTAL KNEE ARTHROPLASTY Right 12/27/2016   Procedure: RIGHT TOTAL KNEE ARTHROPLASTY;  Surgeon: Gaynelle Arabian, MD;  Location: [facility];  Service: Orthopedics;  Laterality: Right;  Marland Kitchen VAGINAL HYSTERECTOMY  1983   endometriosis    SOCIAL HISTORY: Social History   Tobacco Use  . Smoking status: Never Smoker  . Smokeless tobacco: Never Used  Substance Use Topics  . Alcohol use: No    Alcohol/week: 0.0 standard drinks  . Drug use: No    FAMILY HISTORY: Family History  Problem Relation Age of Onset  . Heart failure Mother   . Heart disease Mother   . Heart failure Father   . High blood pressure Father   . Stroke Father   . Heart disease Father   . Heart failure Maternal Grandmother   . Myasthenia gravis Brother   . Stroke Brother   . Clotting disorder Neg Hx   . Colon cancer Neg Hx    ROS: Review of Systems  Constitutional: Positive for malaise/fatigue.  HENT: Positive for tinnitus.        Positive for nose stuffiness. Positive for nasal discharge. Positive for dry mouth. Positive for hoarseness.  Eyes:       Positive for wearing glasses or contacts. Positive for floaters.  Respiratory: Negative for shortness of breath (with activity).        Positive for difficulty breathing while lying down. Positive for sudden awakening from sleep with SOB.  Cardiovascular: Negative for chest pain, orthopnea and claudication.       Positive for chronic systolic CHF.  Gastrointestinal: Positive for constipation and nausea. Negative for vomiting.  Genitourinary:       Positive for frequent urination.  Musculoskeletal: Negative for myalgias.       Negative for muscle weakness. Positive for neck stiffness. Positive for muscle stiffness.  Skin:       Positive for hair loss.  Neurological:       Positive for leg cramping.  Endo/Heme/Allergies:       Positive for hot flashes.   Psychiatric/Behavioral: Positive for depression (emotional eating). Negative for suicidal ideas.       Negative for homicidal ideas.   PHYSICAL EXAM: Blood pressure 139/87, pulse (!) 55, temperature 98.5 F (36.9 C), height 5\' 2"  (1.575 m), weight 171 lb (77.6 kg), SpO2 97 %. Body mass index  is 31.28 kg/m. Physical Exam Vitals signs reviewed.  Constitutional:      Appearance: Normal appearance. She is well-developed. She is obese.  HENT:     Head: Normocephalic and atraumatic.     Nose: Nose normal.  Eyes:     General: No scleral icterus. Neck:     Musculoskeletal: Normal range of motion.  Cardiovascular:     Rate and Rhythm: Normal rate and regular rhythm.  Pulmonary:     Effort: Pulmonary effort is normal. No respiratory distress.  Abdominal:     Palpations: Abdomen is soft.     Tenderness: There is no abdominal tenderness.  Musculoskeletal: Normal range of motion.     Comments: Range of motion normal in all four extremities.  Skin:    General: Skin is warm and dry.  Neurological:     Mental Status: She is alert and oriented to person, place, and time.     Coordination: Coordination normal.  Psychiatric:        Mood and Affect: Mood and affect normal.        Behavior: Behavior normal.   RECENT LABS AND TESTS: BMET    Component Value Date/Time   NA 140 11/24/2017 0230   NA 142 05/26/2017 1225   K 4.9 11/24/2017 0230   CL 101 11/24/2017 0230   CO2 28 11/24/2017 0230   GLUCOSE 113 (H) 11/24/2017 0230   BUN 14 11/24/2017 0230   BUN 15 05/26/2017 1225   CREATININE 0.86 11/24/2017 0230   CALCIUM 9.2 11/24/2017 0230   GFRNONAA >60 11/24/2017 0230   GFRAA >60 11/24/2017 0230   Lab Results  Component Value Date   HGBA1C 5.5 03/14/2016   No results found for: INSULIN CBC    Component Value Date/Time   WBC 5.6 01/16/2018 1624   WBC 6.4 11/24/2017 0230   RBC 4.04 01/16/2018 1624   RBC 4.10 11/24/2017 0230   HGB 12.6 01/16/2018 1624   HCT 37.7 01/16/2018 1624    PLT 198 01/16/2018 1624   MCV 93 01/16/2018 1624   MCH 31.2 01/16/2018 1624   MCH 31.2 11/24/2017 0230   MCHC 33.4 01/16/2018 1624   MCHC 32.5 11/24/2017 0230   RDW 12.8 01/16/2018 1624   LYMPHSABS 1.4 10/03/2016 1438   MONOABS 0.4 10/03/2016 1438   EOSABS 0.1 10/03/2016 1438   BASOSABS 0.0 10/03/2016 1438   Iron/TIBC/Ferritin/ %Sat No results found for: IRON, TIBC, FERRITIN, IRONPCTSAT Lipid Panel     Component Value Date/Time   CHOL 142 03/15/2016 0524   TRIG 73 03/15/2016 0524   HDL 44 03/15/2016 0524   CHOLHDL 3.2 03/15/2016 0524   VLDL 15 03/15/2016 0524   LDLCALC 83 03/15/2016 0524   Hepatic Function Panel     Component Value Date/Time   PROT 6.3 (L) 11/24/2017 0230   ALBUMIN 3.6 11/24/2017 0230   AST 35 11/24/2017 0230   ALT 12 11/24/2017 0230   ALKPHOS 77 11/24/2017 0230   BILITOT 1.1 11/24/2017 0230      Component Value Date/Time   TSH 5.105 (H) 12/15/2012 1208   No results found for: Vitamin D, 25-Hydroxy  ECG shows sinus bradycardia with rate variation cv = 12.Low voltage in precordial leads. Old anteroseptal infarct. ( h/o of NSTEMI ). Otherwise essentially the same as earlier EKG.  INDIRECT CALORIMETER done today shows a VO2 of 194 and a REE of 1351. Her calculated basal metabolic rate is 0000000 thus her basal metabolic rate is better than expected.  OBESITY BEHAVIORAL  INTERVENTION VISIT  Today's visit was #1  Starting weight: 171 lbs Starting date: 02/26/2019 Today's weight: 171 lbs  Today's date: 02/26/2019 Total lbs lost to date: 0 At least 15 minutes were spent on discussing the following behavioral intervention visit.    02/26/2019  Height 5\' 2"  (1.575 m)  Weight 171 lb (77.6 kg)  BMI (Calculated) 31.27  BLOOD PRESSURE - SYSTOLIC [id number]  BLOOD PRESSURE - DIASTOLIC 87  Waist Measurement  36 inches   Body Fat % 46.5 %  Total Body Water (lbs) 66.4 lbs  RMR 1351   ASK: We discussed the diagnosis of obesity with [name] today and Lelon Frohlich  agreed to give [place] permission to discuss obesity behavioral modification therapy today.  ASSESS: Cherokee has the diagnosis of obesity and her BMI today is 31.4. Aarica is in the action stage of change.   ADVISE: Fredi was educated on the multiple health risks of obesity as well as the benefit of weight loss to improve her health. She was advised of the need for long term treatment and the importance of lifestyle modifications to improve her current health and to decrease her risk of future health problems.  AGREE: Multiple dietary modification options and treatment options were discussed and  Sadeen agreed to follow the recommendations documented in the above note.  ARRANGE: Haileymarie was educated on the importance of frequent visits to treat obesity as outlined per CMS and USPSTF guidelines and agreed to schedule her next follow up appointment today.  Migdalia Dk, am acting as Location manager for [employer], DO   I have reviewed the above documentation for accuracy and completeness, and I agree with the above. -Jearld Lesch, DO

## 2019-02-27 LAB — COMPREHENSIVE METABOLIC PANEL
ALT: 14 IU/L (ref 0–32)
AST: 35 IU/L (ref 0–40)
Albumin/Globulin Ratio: 1.9 (ref 1.2–2.2)
Albumin: 4.3 g/dL (ref 3.7–4.7)
Alkaline Phosphatase: 84 IU/L (ref 39–117)
BUN/Creatinine Ratio: 14 (ref 12–28)
BUN: 12 mg/dL (ref 8–27)
Bilirubin Total: 0.3 mg/dL (ref 0.0–1.2)
CO2: 27 mmol/L (ref 20–29)
Calcium: 9.4 mg/dL (ref 8.7–10.3)
Chloride: 102 mmol/L (ref 96–106)
Creatinine, Ser: 0.83 mg/dL (ref 0.57–1.00)
GFR calc Af Amer: 78 mL/min/{1.73_m2} (ref >59)
GFR calc non Af Amer: 68 mL/min/{1.73_m2} (ref >59)
Globulin, Total: 2.3 g/dL (ref 1.5–4.5)
Glucose: 97 mg/dL (ref 65–99)
Potassium: 4.2 mmol/L (ref 3.5–5.2)
Sodium: 141 mmol/L (ref 134–144)
Total Protein: 6.6 g/dL (ref 6.0–8.5)

## 2019-02-27 LAB — LIPID PANEL WITH LDL/HDL RATIO
Cholesterol, Total: 113 mg/dL (ref 100–199)
HDL: 56 mg/dL (ref >39)
LDL Chol Calc (NIH): 44 mg/dL (ref 0–99)
LDL/HDL Ratio: 0.8 ratio (ref 0.0–3.2)
Triglycerides: 60 mg/dL (ref 0–149)
VLDL Cholesterol Cal: 13 mg/dL (ref 5–40)

## 2019-02-27 LAB — VITAMIN D 25 HYDROXY (VIT D DEFICIENCY, FRACTURES): Vit D, 25-Hydroxy: 43.3 ng/mL (ref 30.0–100.0)

## 2019-02-27 LAB — HEMOGLOBIN A1C
Est. average glucose Bld gHb Est-mCnc: 114 mg/dL
Hgb A1c MFr Bld: 5.6 % (ref 4.8–5.6)

## 2019-02-27 LAB — INSULIN, RANDOM: INSULIN: 9.9 u[IU]/mL (ref 2.6–24.9)
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
## 2019-03-06 ENCOUNTER — Other Ambulatory Visit: Payer: Self-pay

## 2019-03-06 ENCOUNTER — Ambulatory Visit (INDEPENDENT_AMBULATORY_CARE_PROVIDER_SITE_OTHER): Payer: PPO | Admitting: Psychology

## 2019-03-06 DIAGNOSIS — F331 Major depressive disorder, recurrent, moderate: Secondary | ICD-10-CM

## 2019-03-12 ENCOUNTER — Other Ambulatory Visit: Payer: Self-pay

## 2019-03-12 ENCOUNTER — Ambulatory Visit (INDEPENDENT_AMBULATORY_CARE_PROVIDER_SITE_OTHER): Payer: PPO | Admitting: Bariatrics

## 2019-03-12 VITALS — BP 119/73 | HR 63 | Temp 98.4°F | Ht 62.0 in | Wt 171.0 lb

## 2019-03-12 DIAGNOSIS — E6609 Other obesity due to excess calories: Secondary | ICD-10-CM | POA: Diagnosis not present

## 2019-03-12 DIAGNOSIS — E038 Other specified hypothyroidism: Secondary | ICD-10-CM

## 2019-03-12 DIAGNOSIS — E669 Obesity, unspecified: Secondary | ICD-10-CM

## 2019-03-12 DIAGNOSIS — Z6831 Body mass index (BMI) 31.0-31.9, adult: Secondary | ICD-10-CM

## 2019-03-12 DIAGNOSIS — E559 Vitamin D deficiency, unspecified: Secondary | ICD-10-CM

## 2019-03-12 DIAGNOSIS — E7800 Pure hypercholesterolemia, unspecified: Secondary | ICD-10-CM

## 2019-03-12 NOTE — Progress Notes (Signed)
Office: [phone]  /  Fax: [phone]   HPI:   Chief Complaint: OBESITY Michele Thompson is here to discuss her progress with her obesity treatment plan. She is on the Category 1 plan and is following her eating plan approximately 50% of the time. She states she is doing cardio/weights 45-50 minutes 3 times per week. [name]'s weight has remained the same. She states that she "wasn't very good at the plan." She eats what she orders from her facility. She has not eaten candy. Her weight is 171 lb (77.6 kg) today and has not lost weight since her last visit. She has lost 0 lbs since starting treatment with [place].  Hypothyroidism Michele Thompson has a diagnosis of hypothyroidism, which is controlled. She is on Synthroid. She denies hot or cold intolerance or palpitations, but does admit to ongoing fatigue.  Hypercholesterolemia Roderica has hypercholesterolemia, which is controlled. She is taking Crestor.  Vitamin D deficiency Michele Thompson has a diagnosis of Vitamin D deficiency. Last Vitamin D was 43.3 on 02/26/2019. She is currently taking OTC Vit D and denies nausea, vomiting or muscle weakness.  ASSESSMENT AND PLAN:  Other specified hypothyroidism  Hypercholesteremia  Vitamin D deficiency  Class 1 obesity with serious comorbidity and body mass index (BMI) of 31.0 to 31.9 in adult, unspecified obesity type  PLAN:  Hypothyroidism Michele Thompson was informed of the importance of good thyroid control to help with weight loss efforts. She was also informed that supertheraputic thyroid levels are dangerous and will not improve weight loss results. [name] will continue Synthroid and follow-up as directed.  Hypercholesterolemia Michele Thompson will continue Crestor and follow-up as directed.  Vitamin D Deficiency Michele Thompson was informed that low Vitamin D levels contributes to fatigue and are associated with obesity, breast, and colon cancer. She agrees to continue to take OTC Vit D @ 2,000 IU daily and will follow-up for routine testing of Vitamin D, at  least 2-3 times per year. She was informed of the risk of over-replacement of Vitamin D and agrees to not increase her dose unless she discusses this with [place] first. Michele Thompson agrees to follow-up with our clinic in 2-3 weeks.  Obesity [name] is currently in the action stage of change. As such, her goal is to continue with weight loss efforts. She has agreed to follow the Category 1 plan. Michele Thompson will work on meal planning, stop desserts, will not bring in anything that tempts her, and will get light hot chocolate for the afternoon. Michele Thompson has been instructed to walk at the facility for weight loss and overall health benefits. We discussed the following Behavioral Modification Strategies today: increasing lean protein intake, decreasing simple carbohydrates, increasing vegetables, increase H20 intake, decrease eating out, no skipping meals, work on meal planning and easy cooking plans, keeping healthy foods in the home, and planning for success.  Michele Thompson has agreed to follow-up with our clinic in 2-3 weeks. She was informed of the importance of frequent follow-up visits to maximize her success with intensive lifestyle modifications for her multiple health conditions.  ALLERGIES: No Known Allergies  MEDICATIONS: Current Outpatient Medications on File Prior to Visit  Medication Sig Dispense Refill   betamethasone dipropionate 0.05 % lotion Apply topically 2 (two) times daily.     Cholecalciferol (VITAMIN D3) 2000 units TABS Take 1 tablet by mouth daily.     diltiazem (CARDIZEM) 30 MG tablet Take 1 tablet every 4 hours AS NEEDED for AFIB heart rate >100 as long as top number of blood pressure >100. 45 tablet 1  doxycycline (VIBRAMYCIN) 50 MG capsule Take 50 mg by mouth daily.     ELIQUIS 5 MG TABS tablet TAKE 1 TABLET BY MOUTH TWICE DAILY. 180 tablet 0   gabapentin (NEURONTIN) 300 MG capsule Take 1 capsule by mouth at bedtime.     hydrochlorothiazide (MICROZIDE) 12.5 MG capsule Take 12.5 mg by mouth daily.       levothyroxine (SYNTHROID, LEVOTHROID) 100 MCG tablet Take 100 mcg by mouth daily before breakfast. TAKE WHOLE TABLET (100 MCG) ALL DAYS EXCEPT ON SUNDAYS TAKE 1/2 TABLET (50 MCG).     lisinopril (PRINIVIL,ZESTRIL) 40 MG tablet TAKE 1 TABLET EACH DAY. 90 tablet 3   meloxicam (MOBIC) 7.5 MG tablet Use it PRN for Right Knee pain 15 tablet 0   rosuvastatin (CRESTOR) 20 MG tablet Take 1 tablet (20 mg total) by mouth daily. 90 tablet 3   Current Facility-Administered Medications on File Prior to Visit  Medication Dose Route Frequency Provider Last Rate Last Dose   0.9 %  sodium chloride infusion  500 mL Intravenous Once Nandigam, Venia Minks, MD       metroNIDAZOLE (METROCREAM) 0.75 % cream   Topical BID Virgie Dad, MD        PAST MEDICAL HISTORY: Past Medical History:  Diagnosis Date   Anxiety    Aortic atherosclerosis ([facility])    Arthritis    Constipation    Coronary artery disease 03/16/2016   a. admx with angina and mildly elevated Troponin >> LHC: mLAD 30 >> CCB started for poss microvascular angina   Depression    Diverticulosis    Edema of both lower extremities    Fatty liver    History of MI (myocardial infarction)    Hyperlipidemia 03/16/2016   Hypertension    Hypothyroidism    Joint pain    Paroxysmal A-fib ([place])    PE (pulmonary thromboembolism) ([place]) 03/17/2016   Bilateral submassive pulmonary embolus with RUE and RLE DVT on venous duplex 02/2016 c/b PEA arrest // Echo 03/21/16: mild LVH, EF 60-65, no RWMA, Gr 1 DD, normal RVSF   Psoas tendinitis    Sinus bradycardia 03/31/2016   Thrombocytopenia ([place]) 02/2016   Profound thrombocytopenia noted upon admission for pulmonary embolism >> initially thought to be from Hep induced thrombocytopenia given recent admit for cardiac cath and Heparin use // However, HIT Ab was neg (0.184) // Hematology consult pending   Thyroid disease    Unilateral primary osteoarthritis, right knee    Vascular  malformation    mid ascending colon   Vitamin D deficiency     PAST SURGICAL HISTORY: Past Surgical History:  Procedure Laterality Date   CARDIAC CATHETERIZATION N/A 03/15/2016   Procedure: Left Heart Cath and Coronary Angiography;  Surgeon: [name], MD;  Location: [facility];  Service: Cardiovascular;  Laterality: N/A;   TOTAL KNEE ARTHROPLASTY Right 12/27/2016   Procedure: RIGHT TOTAL KNEE ARTHROPLASTY;  Surgeon: Gaynelle Arabian, MD;  Location: [facility];  Service: Orthopedics;  Laterality: Right;   VAGINAL HYSTERECTOMY  1983   endometriosis    SOCIAL HISTORY: Social History   Tobacco Use   Smoking status: Never Smoker   Smokeless tobacco: Never Used  Substance Use Topics   Alcohol use: No    Alcohol/week: 0.0 standard drinks   Drug use: No    FAMILY HISTORY: Family History  Problem Relation Age of Onset   Heart failure Mother    Heart disease Mother    Heart failure Father    High  blood pressure Father    Stroke Father    Heart disease Father    Heart failure Maternal Grandmother    Myasthenia gravis Brother    Stroke Brother    Clotting disorder Neg Hx    Colon cancer Neg Hx    ROS: Review of Systems  Constitutional: Positive for malaise/fatigue.  Cardiovascular: Negative for palpitations.  Gastrointestinal: Negative for nausea and vomiting.  Musculoskeletal:       Negative for muscle weakness.  Endo/Heme/Allergies:       Negative for hot/cold intolerance.   PHYSICAL EXAM: Blood pressure 119/73, pulse 63, temperature 98.4 F (36.9 C), height 5\' 2"  (1.575 m), weight 171 lb (77.6 kg), SpO2 97 %. Body mass index is 31.28 kg/m. Physical Exam Vitals signs reviewed.  Constitutional:      Appearance: Normal appearance. She is obese.  Cardiovascular:     Rate and Rhythm: Normal rate.     Pulses: Normal pulses.  Pulmonary:     Effort: Pulmonary effort is normal.     Breath sounds: Normal breath sounds.  Musculoskeletal:  Normal range of motion.  Skin:    General: Skin is warm and dry.  Neurological:     Mental Status: She is alert and oriented to person, place, and time.  Psychiatric:        Behavior: Behavior normal.   RECENT LABS AND TESTS: BMET    Component Value Date/Time   NA 141 02/26/2019 0858   K 4.2 02/26/2019 0858   CL 102 02/26/2019 0858   CO2 27 02/26/2019 0858   GLUCOSE 97 02/26/2019 0858   GLUCOSE 113 (H) 11/24/2017 0230   BUN 12 02/26/2019 0858   CREATININE 0.83 02/26/2019 0858   CALCIUM 9.4 02/26/2019 0858   GFRNONAA 68 02/26/2019 0858   GFRAA 78 02/26/2019 0858   Lab Results  Component Value Date   HGBA1C 5.6 02/26/2019   HGBA1C 5.5 03/14/2016   Lab Results  Component Value Date   INSULIN 9.9 02/26/2019   CBC    Component Value Date/Time   WBC 5.6 01/16/2018 1624   WBC 6.4 11/24/2017 0230   RBC 4.04 01/16/2018 1624   RBC 4.10 11/24/2017 0230   HGB 12.6 01/16/2018 1624   HCT 37.7 01/16/2018 1624   PLT 198 01/16/2018 1624   MCV 93 01/16/2018 1624   MCH 31.2 01/16/2018 1624   MCH 31.2 11/24/2017 0230   MCHC 33.4 01/16/2018 1624   MCHC 32.5 11/24/2017 0230   RDW 12.8 01/16/2018 1624   LYMPHSABS 1.4 10/03/2016 1438   MONOABS 0.4 10/03/2016 1438   EOSABS 0.1 10/03/2016 1438   BASOSABS 0.0 10/03/2016 1438   Iron/TIBC/Ferritin/ %Sat No results found for: IRON, TIBC, FERRITIN, IRONPCTSAT Lipid Panel     Component Value Date/Time   CHOL 113 02/26/2019 0858   TRIG 60 02/26/2019 0858   HDL 56 02/26/2019 0858   CHOLHDL 3.2 03/15/2016 0524   VLDL 15 03/15/2016 0524   LDLCALC 44 02/26/2019 0858   Hepatic Function Panel     Component Value Date/Time   PROT 6.6 02/26/2019 0858   ALBUMIN 4.3 02/26/2019 0858   AST 35 02/26/2019 0858   ALT 14 02/26/2019 0858   ALKPHOS 84 02/26/2019 0858   BILITOT 0.3 02/26/2019 0858      Component Value Date/Time   TSH 5.105 (H) 12/15/2012 1208   Results for KOWNACKI, Hadessah M (MRN [id number]) as of 03/12/2019 14:09  Ref. Range  02/26/2019 08:58  Vitamin D, 25-Hydroxy Latest Ref Range: 30.0 -  100.0 ng/mL 43.3   OBESITY BEHAVIORAL INTERVENTION VISIT  Today's visit was #2  Starting weight: 171 lbs Starting date: 02/26/2019 Today's weight: 171 lbs  Today's date: 03/12/2019 Total lbs lost to date: 0 At least 15 minutes were spent on discussing the following behavioral intervention visit.    03/12/2019  Height 5\' 2"  (1.575 m)  Weight 171 lb (77.6 kg)  BMI (Calculated) 31.27  BLOOD PRESSURE - SYSTOLIC [id number]  BLOOD PRESSURE - DIASTOLIC 73   Body Fat % [id number] %  Total Body Water (lbs) 65.8 lbs   ASK: We discussed the diagnosis of obesity with [name] today and Lelon Frohlich agreed to give [place] permission to discuss obesity behavioral modification therapy today.  ASSESS: Brigette has the diagnosis of obesity and her BMI today is 31.3. Yanill is in the action stage of change.   ADVISE: Wray was educated on the multiple health risks of obesity as well as the benefit of weight loss to improve her health. She was advised of the need for long term treatment and the importance of lifestyle modifications to improve her current health and to decrease her risk of future health problems.  AGREE: Multiple dietary modification options and treatment options were discussed and  Keoshia agreed to follow the recommendations documented in the above note.  ARRANGE: Winner was educated on the importance of frequent visits to treat obesity as outlined per CMS and USPSTF guidelines and agreed to schedule her next follow up appointment today.  Migdalia Dk, am acting as Location manager for [employer], DO  I have reviewed the above documentation for accuracy and completeness, and I agree with the above. -Jearld Lesch, DO

## 2019-03-13 ENCOUNTER — Encounter (INDEPENDENT_AMBULATORY_CARE_PROVIDER_SITE_OTHER): Payer: Self-pay | Admitting: Bariatrics

## 2019-03-13 DIAGNOSIS — Z6831 Body mass index (BMI) 31.0-31.9, adult: Secondary | ICD-10-CM | POA: Insufficient documentation

## 2019-03-13 DIAGNOSIS — E669 Obesity, unspecified: Secondary | ICD-10-CM | POA: Insufficient documentation

## 2019-03-13 DIAGNOSIS — E6609 Other obesity due to excess calories: Secondary | ICD-10-CM | POA: Insufficient documentation

## 2019-03-29 ENCOUNTER — Ambulatory Visit: Payer: PPO | Admitting: Cardiovascular Disease

## 2019-03-29 ENCOUNTER — Other Ambulatory Visit: Payer: Self-pay

## 2019-03-29 ENCOUNTER — Encounter: Payer: Self-pay | Admitting: Cardiovascular Disease

## 2019-03-29 VITALS — BP 119/69 | HR 63 | Temp 97.0°F | Ht 62.0 in | Wt 174.0 lb

## 2019-03-29 DIAGNOSIS — Z86711 Personal history of pulmonary embolism: Secondary | ICD-10-CM | POA: Diagnosis not present

## 2019-03-29 DIAGNOSIS — I480 Paroxysmal atrial fibrillation: Secondary | ICD-10-CM

## 2019-03-29 DIAGNOSIS — Z7901 Long term (current) use of anticoagulants: Secondary | ICD-10-CM | POA: Diagnosis not present

## 2019-03-29 DIAGNOSIS — I2510 Atherosclerotic heart disease of native coronary artery without angina pectoris: Secondary | ICD-10-CM

## 2019-03-29 DIAGNOSIS — I10 Essential (primary) hypertension: Secondary | ICD-10-CM

## 2019-03-29 DIAGNOSIS — I495 Sick sinus syndrome: Secondary | ICD-10-CM | POA: Diagnosis not present

## 2019-03-29 DIAGNOSIS — E7800 Pure hypercholesterolemia, unspecified: Secondary | ICD-10-CM | POA: Diagnosis not present

## 2019-03-29 NOTE — Progress Notes (Signed)
.  Cardiology Office Note:    Date:  03/29/2019   ID:  Michele Thompson, DOB 12-23-1940, MRN [id number]  PCP:  Kathyrn Lass, MD  Cardiologist:  Sanda Klein, MD   Referring MD: Kathyrn Lass, MD   Chief Complaint  Patient presents with  . Atrial Fibrillation    History of Present Illness:    Michele Thompson is a 78 y.o. female with a hx of massive pulmonary embolism, complicated by cardiorespiratory arrest, while on estrogen therapy in 2017.  She had an episode of paroxysmal atrial fibrillation and underwent cardioversion in August 2019.  She also has minor coronary artery disease and suspicion for coronary vasospasm on previous angiography, hypertension and hyperlipidemia.  Previous Holter monitor showed evidence of sinus node dysfunction and chronotropic incompetence while taking beta-blockers.  Consequently, she is only receiving "as needed" diltiazem for episodes of palpitations consistent with atrial fibrillation, rather than a scheduled AV blocking agent.  She has normal left ventricular systolic function on echo and had normal pulmonary function test in 2018.   She is doing quite well from a cardiovascular point of view. She is not troubled by sustained palpitations. She rarely has skipped beats. She has not taken diltiazem in the last 12 months. She denies dizziness, syncope, focal neurological complaints, edema, orthopnea, PND or exertional chest discomfort. She has been exercising regularly at the gym and monitors her heart rate, which typically reaches about 100 bpm. If she tries to push any harder she does not feel well. She has not had any bleeding problems, falls or serious injuries.  Her most recent cholesterol profile showed excellent results.  Past Medical History:  Diagnosis Date  . Anxiety   . Aortic atherosclerosis ([place])   . Arthritis   . Constipation   . Coronary artery disease 03/16/2016   a. admx with angina and mildly elevated Troponin >> LHC: mLAD 30 >> CCB started  for poss microvascular angina  . Depression   . Diverticulosis   . Edema of both lower extremities   . Fatty liver   . History of MI (myocardial infarction)   . Hyperlipidemia 03/16/2016  . Hypertension   . Hypothyroidism   . Joint pain   . Paroxysmal A-fib ([place])   . PE (pulmonary thromboembolism) ([place]) 03/17/2016   Bilateral submassive pulmonary embolus with RUE and RLE DVT on venous duplex 02/2016 c/b PEA arrest // Echo 03/21/16: mild LVH, EF 60-65, no RWMA, Gr 1 DD, normal RVSF  . Psoas tendinitis   . Sinus bradycardia 03/31/2016  . Thrombocytopenia ([place]) 02/2016   Profound thrombocytopenia noted upon admission for pulmonary embolism >> initially thought to be from Hep induced thrombocytopenia given recent admit for cardiac cath and Heparin use // However, HIT Ab was neg (0.184) // Hematology consult pending  . Thyroid disease   . Unilateral primary osteoarthritis, right knee   . Vascular malformation    mid ascending colon  . Vitamin D deficiency     Past Surgical History:  Procedure Laterality Date  . CARDIAC CATHETERIZATION N/A 03/15/2016   Procedure: Left Heart Cath and Coronary Angiography;  Surgeon: [name], MD;  Location: [facility];  Service: Cardiovascular;  Laterality: N/A;  . TOTAL KNEE ARTHROPLASTY Right 12/27/2016   Procedure: RIGHT TOTAL KNEE ARTHROPLASTY;  Surgeon: Gaynelle Arabian, MD;  Location: [facility];  Service: Orthopedics;  Laterality: Right;  Marland Kitchen VAGINAL HYSTERECTOMY  1983   endometriosis    Current Medications: Current Meds  Medication Sig  . betamethasone  dipropionate 0.05 % lotion Apply topically 2 (two) times daily.  . Cholecalciferol (VITAMIN D3) 2000 units TABS Take 1 tablet by mouth daily.  Marland Kitchen ELIQUIS 5 MG TABS tablet TAKE 1 TABLET BY MOUTH TWICE DAILY.  Marland Kitchen gabapentin (NEURONTIN) 300 MG capsule Take 1 capsule by mouth at bedtime.  . hydrochlorothiazide (MICROZIDE) 12.5 MG capsule Take 12.5 mg by mouth daily.  Marland Kitchen levothyroxine (SYNTHROID,  LEVOTHROID) 100 MCG tablet Take 100 mcg by mouth daily before breakfast. TAKE WHOLE TABLET (100 MCG) ALL DAYS EXCEPT ON SUNDAYS TAKE 1/2 TABLET (50 MCG).  Marland Kitchen lisinopril (PRINIVIL,ZESTRIL) 40 MG tablet TAKE 1 TABLET EACH DAY.  . meloxicam (MOBIC) 7.5 MG tablet Use it PRN for Right Knee pain  . rosuvastatin (CRESTOR) 20 MG tablet Take 1 tablet (20 mg total) by mouth daily.   Current Facility-Administered Medications for the 03/29/19 encounter (Office Visit) with Sanda Klein, MD  Medication  . 0.9 %  sodium chloride infusion  . metroNIDAZOLE (METROCREAM) 0.75 % cream     Allergies:   Patient has no known allergies.   Social History   Socioeconomic History  . Marital status: Widowed    Spouse name: Not on file  . Number of children: Not on file  . Years of education: Not on file  . Highest education level: Not on file  Occupational History  . Occupation: retired  Tobacco Use  . Smoking status: Never Smoker  . Smokeless tobacco: Never Used  Substance and Sexual Activity  . Alcohol use: No    Alcohol/week: 0.0 standard drinks  . Drug use: No  . Sexual activity: Never    Birth control/protection: Surgical    Comment: 1st intercourse 78 yo-Fewer than 5 partners  Other Topics Concern  . Not on file  Social History Narrative   [facility]:   Originally from [place]. Moved to [place] in 2012. 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   Social Determinants of Health   Financial Resource Strain:   . Difficulty of Paying Living Expenses: Not on file  Food Insecurity:   . Worried About Charity fundraiser in the Last Year: Not on file  . Ran Out of Food in the Last Year: Not on file  Transportation Needs:   . Lack of Transportation (Medical): Not on file  . Lack of Transportation (Non-Medical): Not on file  Physical Activity:   . Days of Exercise per Week: Not on file  . Minutes of Exercise per Session: Not on file   Stress:   . Feeling of Stress : Not on file  Social Connections:   . Frequency of Communication with Friends and Family: Not on file  . Frequency of Social Gatherings with Friends and Family: Not on file  . Attends Religious Services: Not on file  . Active Member of Clubs or Organizations: Not on file  . Attends Archivist Meetings: Not on file  . Marital Status: Not on file     Family History: The patient's family history includes Heart disease in her father and mother; Heart failure in her father, maternal grandmother, and mother; High blood pressure in her father; Myasthenia gravis in her brother; Stroke in her brother and father. There is no history of Clotting disorder or Colon cancer.  ROS:   Please see the history of present illness.    All other systems are reviewed and are negative.  EKGs/Labs/Other Studies Reviewed:    The following studies were  reviewed today:  EKG:  EKG is not ordered today. The tracing from 02/26/2019 shows sinus bradycardia. There is a reduction in R wave amplitude from V1-V2 which is likely due to lead placement, not a true anteroseptal infarction. There are no ischemic repolarization changes.  Recent Labs: 02/26/2019: ALT 14; BUN 12; Creatinine, Ser 0.83; Potassium 4.2; Sodium 141  Recent Lipid Panel    Component Value Date/Time   CHOL 113 02/26/2019 0858   TRIG 60 02/26/2019 0858   HDL 56 02/26/2019 0858   CHOLHDL 3.2 03/15/2016 0524   VLDL 15 03/15/2016 0524   LDLCALC 44 02/26/2019 0858    Physical Exam:    VS:  BP 119/69   Pulse 63   Temp (!) 97 F (36.1 C)   Ht 5\' 2"  (1.575 m)   Wt 174 lb (78.9 kg)   SpO2 100%   BMI 31.83 kg/m     Wt Readings from Last 3 Encounters:  03/29/19 174 lb (78.9 kg)  03/12/19 171 lb (77.6 kg)  02/26/19 171 lb (77.6 kg)     General: Alert, oriented x3, no distress, mildly obese Head: no evidence of trauma, PERRL, EOMI, no exophtalmos or lid lag, no myxedema, no xanthelasma; normal ears,  nose and oropharynx Neck: normal jugular venous pulsations and no hepatojugular reflux; brisk carotid pulses without delay and no carotid bruits Chest: clear to auscultation, no signs of consolidation by percussion or palpation, normal fremitus, symmetrical and full respiratory excursions Cardiovascular: normal position and quality of the apical impulse, regular rhythm, normal first and second heart sounds, no murmurs, rubs or gallops Abdomen: no tenderness or distention, no masses by palpation, no abnormal pulsatility or arterial bruits, normal bowel sounds, no hepatosplenomegaly Extremities: no clubbing, cyanosis or edema; 2+ radial, ulnar and brachial pulses bilaterally; 2+ right femoral, posterior tibial and dorsalis pedis pulses; 2+ left femoral, posterior tibial and dorsalis pedis pulses; no subclavian or femoral bruits Neurological: grossly nonfocal Psych: Normal mood and affect   ASSESSMENT:    1. Paroxysmal atrial fibrillation (HCC)   2. SSS (sick sinus syndrome) ([place])   3. Coronary artery disease involving native coronary artery of native heart without angina pectoris   4. History of pulmonary embolism   5. Long term current use of anticoagulant   6. Essential hypertension   7. Hypercholesterolemia    PLAN:    In order of problems listed above:  1. PAFib: CHA2DS2-Vasc score 5 (age, CAD, HTN, female). Palpitations are barely noticeable and she's definitely not had any sustained events that have been clinically significant. Avoiding sugar antiarrhythmics and AV nodal blocking agents due to underlying conduction system disease. 2. SSS: She has sinus bradycardia and appears to have chronotropic incompetence. This is only noticeable if he tries to push her exercise level and does not really interfere with activities otherwise. She meets criteria for pacemaker if her symptoms worsen. 3. CAD: Mild stenoses on previous cardiac catheterization 2017. Lipid profile is excellent on statin.  Not taking aspirin due to full anticoagulation. Suspected of having coronary vasospasm, but currently asymptomatic. 4. Hx of PE: Will be on anticoagulation for her atrial fibrillation anyway. 5. Anticoagulation: No bleeding complications 6. HTN: Well-controlled. 7. HLP: Tolerating statin without side effects; excellent lipid profile parameters. Weight loss is still recommended.   Patient Instructions  Medication Instructions:  No changes *If you need a refill on your cardiac medications before your next appointment, please call your pharmacy*  Lab Work: None ordered If you have labs (blood work) drawn  today and your tests are completely normal, you will receive your results only by: Marland Kitchen MyChart Message (if you have MyChart) OR . A paper copy in the mail If you have any lab test that is abnormal or we need to change your treatment, we will call you to review the results.  Testing/Procedures: None ordered  Follow-Up: At [facility], you and your health needs are our priority.  As part of our continuing mission to provide you with exceptional heart care, we have created designated Provider Care Teams.  These Care Teams include your primary Cardiologist (physician) and Advanced Practice Providers (APPs -  Physician Assistants and Nurse Practitioners) who all work together to provide you with the care you need, when you need it.  Your next appointment:   12 month(s)  The format for your next appointment:   In Person  Provider:   Sanda Klein, MD      Medication Adjustments/Labs and Tests Ordered: Current medicines are reviewed at length with the patient today.  Concerns regarding medicines are outlined above.  No orders of the defined types were placed in this encounter.  No orders of the defined types were placed in this encounter.   Signed, Sanda Klein, MD  03/29/2019 12:33 PM    [place]

## 2019-03-29 NOTE — Patient Instructions (Signed)

## 2019-04-02 ENCOUNTER — Other Ambulatory Visit: Payer: Self-pay

## 2019-04-02 ENCOUNTER — Encounter (INDEPENDENT_AMBULATORY_CARE_PROVIDER_SITE_OTHER): Payer: Self-pay | Admitting: Bariatrics

## 2019-04-02 ENCOUNTER — Ambulatory Visit (INDEPENDENT_AMBULATORY_CARE_PROVIDER_SITE_OTHER): Payer: PPO | Admitting: Bariatrics

## 2019-04-02 VITALS — BP 134/57 | HR 56 | Temp 97.9°F | Ht 62.0 in | Wt 169.0 lb

## 2019-04-02 DIAGNOSIS — E7800 Pure hypercholesterolemia, unspecified: Secondary | ICD-10-CM | POA: Diagnosis not present

## 2019-04-02 DIAGNOSIS — I480 Paroxysmal atrial fibrillation: Secondary | ICD-10-CM | POA: Diagnosis not present

## 2019-04-02 DIAGNOSIS — E890 Postprocedural hypothyroidism: Secondary | ICD-10-CM | POA: Diagnosis not present

## 2019-04-02 DIAGNOSIS — I25119 Atherosclerotic heart disease of native coronary artery with unspecified angina pectoris: Secondary | ICD-10-CM | POA: Diagnosis not present

## 2019-04-02 DIAGNOSIS — I10 Essential (primary) hypertension: Secondary | ICD-10-CM | POA: Diagnosis not present

## 2019-04-02 DIAGNOSIS — E669 Obesity, unspecified: Secondary | ICD-10-CM

## 2019-04-02 DIAGNOSIS — Z6831 Body mass index (BMI) 31.0-31.9, adult: Secondary | ICD-10-CM

## 2019-04-02 NOTE — Progress Notes (Signed)
Office: [phone]  /  Fax: [phone]   HPI:  Chief Complaint: OBESITY Michele Thompson is here to discuss her progress with her obesity treatment plan. She is on the Category 1 plan and states she is following her eating plan approximately 0% of the time. She states she is doing cardio 40 minutes 3 times per week and weights 60 minutes 3 times per week.  Michele Thompson is down 2 lbs. She reports drinking adequate water and no sodas.  Today's visit was #3 Starting weight: 171 lbs Starting date: 02/26/2019 Today's weight: 169 lbs  Today's date: 04/02/2019 Total lbs lost to date: 2 Total lbs lost since last in-office visit: 2  Hypertension Michele Thompson has a diagnosis of hypertension and is taking Diltiazem, HCTZ, and lisinopril. Blood pressure today is 134/57.  Hypercholesterolemia Michele Thompson has a diagnosis of hypercholesterolemia and is taking Crestor.  ASSESSMENT AND PLAN:  Essential hypertension  Hypercholesteremia  Class 1 obesity with serious comorbidity and body mass index (BMI) of 31.0 to 31.9 in adult, unspecified obesity type  PLAN:  Hypertension Michele Thompson is working on healthy weight loss and exercise to improve blood pressure control. She will continue her medications as prescribed. We will watch for signs of hypotension as she continues her lifestyle modifications.  Hypercholesterolemia Michele Thompson will continue her medications as prescribed and follow-up as directed to monitor her progress.  Obesity Michele Thompson is currently in the action stage of change. As such, her goal is to continue with weight loss efforts. She has agreed to follow the Category 1 plan with additional Category 1 and 2 breakfast options. Michele Thompson will work on meal planning and will limit calories for snacks to 100 calories.  For dinner she will have 4 ounces of meat and 1 cup of milk. Michele Thompson has been instructed to work up to a goal of 150 minutes of combined cardio and strengthening exercise per week for weight loss and overall health benefits. We  discussed the following Behavioral Modification Strategies today: increasing lean protein intake, decreasing simple carbohydrates, increasing vegetables, increase H20 intake, decrease eating out, no skipping meals, work on meal planning and easy cooking plans, keeping healthy foods in the home, and planning for success.  Michele Thompson has agreed to follow-up with our clinic in 3 weeks. She was informed of the importance of frequent follow-up visits to maximize her success with intensive lifestyle modifications for her multiple health conditions.  ALLERGIES: No Known Allergies  MEDICATIONS: Current Outpatient Medications on File Prior to Visit  Medication Sig Dispense Refill  . betamethasone dipropionate 0.05 % lotion Apply topically 2 (two) times daily.    . Cholecalciferol (VITAMIN D3) 2000 units TABS Take 1 tablet by mouth daily.    Marland Kitchen diltiazem (CARDIZEM) 30 MG tablet Take 1 tablet every 4 hours AS NEEDED for AFIB heart rate >100 as long as top number of blood pressure >100. 45 tablet 1  . doxycycline (VIBRAMYCIN) 50 MG capsule Take 50 mg by mouth daily.    Marland Kitchen ELIQUIS 5 MG TABS tablet TAKE 1 TABLET BY MOUTH TWICE DAILY. 180 tablet 0  . gabapentin (NEURONTIN) 300 MG capsule Take 1 capsule by mouth at bedtime.    . hydrochlorothiazide (MICROZIDE) 12.5 MG capsule Take 12.5 mg by mouth daily.    Marland Kitchen levothyroxine (SYNTHROID, LEVOTHROID) 100 MCG tablet Take 100 mcg by mouth daily before breakfast. TAKE WHOLE TABLET (100 MCG) ALL DAYS EXCEPT ON SUNDAYS TAKE 1/2 TABLET (50 MCG).    Marland Kitchen lisinopril (PRINIVIL,ZESTRIL) 40 MG tablet TAKE 1 TABLET EACH DAY.  90 tablet 3  . meloxicam (MOBIC) 7.5 MG tablet Use it PRN for Right Knee pain 15 tablet 0  . rosuvastatin (CRESTOR) 20 MG tablet Take 1 tablet (20 mg total) by mouth daily. 90 tablet 3   Current Facility-Administered Medications on File Prior to Visit  Medication Dose Route Frequency Provider Last Rate Last Admin  . 0.9 %  sodium chloride infusion  500 mL  Intravenous Once Nandigam, Kavitha V, MD      . metroNIDAZOLE (METROCREAM) 0.75 % cream   Topical BID Michele Dad, MD        PAST MEDICAL HISTORY: Past Medical History:  Diagnosis Date  . Anxiety   . Aortic atherosclerosis ([place])   . Arthritis   . Constipation   . Coronary artery disease 03/16/2016   a. admx with angina and mildly elevated Troponin >> LHC: mLAD 30 >> CCB started for poss microvascular angina  . Depression   . Diverticulosis   . Edema of both lower extremities   . Fatty liver   . History of MI (myocardial infarction)   . Hyperlipidemia 03/16/2016  . Hypertension   . Hypothyroidism   . Joint pain   . Paroxysmal A-fib ([place])   . PE (pulmonary thromboembolism) ([place]) 03/17/2016   Bilateral submassive pulmonary embolus with RUE and RLE DVT on venous duplex 02/2016 c/b PEA arrest // Echo 03/21/16: mild LVH, EF 60-65, no RWMA, Gr 1 DD, normal RVSF  . Psoas tendinitis   . Sinus bradycardia 03/31/2016  . Thrombocytopenia ([place]) 02/2016   Profound thrombocytopenia noted upon admission for pulmonary embolism >> initially thought to be from Hep induced thrombocytopenia given recent admit for cardiac cath and Heparin use // However, HIT Ab was neg (0.184) // Hematology consult pending  . Thyroid disease   . Unilateral primary osteoarthritis, right knee   . Vascular malformation    mid ascending colon  . Vitamin D deficiency     PAST SURGICAL HISTORY: Past Surgical History:  Procedure Laterality Date  . CARDIAC CATHETERIZATION N/A 03/15/2016   Procedure: Left Heart Cath and Coronary Angiography;  Surgeon: [name], MD;  Location: [facility];  Service: Cardiovascular;  Laterality: N/A;  . TOTAL KNEE ARTHROPLASTY Right 12/27/2016   Procedure: RIGHT TOTAL KNEE ARTHROPLASTY;  Surgeon: Gaynelle Arabian, MD;  Location: [facility];  Service: Orthopedics;  Laterality: Right;  Marland Kitchen VAGINAL HYSTERECTOMY  1983   endometriosis    SOCIAL HISTORY: Social History   Tobacco  Use  . Smoking status: Never Smoker  . Smokeless tobacco: Never Used  Substance Use Topics  . Alcohol use: No    Alcohol/week: 0.0 standard drinks  . Drug use: No    FAMILY HISTORY: Family History  Problem Relation Age of Onset  . Heart failure Mother   . Heart disease Mother   . Heart failure Father   . High blood pressure Father   . Stroke Father   . Heart disease Father   . Heart failure Maternal Grandmother   . Myasthenia gravis Brother   . Stroke Brother   . Clotting disorder Neg Hx   . Colon cancer Neg Hx    ROS: ROS none noted.  PHYSICAL EXAM: Blood pressure (!) 134/57, pulse (!) 56, temperature 97.9 F (36.6 C), height 5\' 2"  (1.575 m), weight 169 lb (76.7 kg), SpO2 100 %. Body mass index is 30.91 kg/m. Physical Exam Vitals reviewed.  Constitutional:      Appearance: Normal appearance. She is obese.  Cardiovascular:  Rate and Rhythm: Normal rate.     Pulses: Normal pulses.  Pulmonary:     Effort: Pulmonary effort is normal.     Breath sounds: Normal breath sounds.  Musculoskeletal:        General: Normal range of motion.  Skin:    General: Skin is warm and dry.  Neurological:     Mental Status: She is alert and oriented to person, place, and time.  Psychiatric:        Behavior: Behavior normal.   RECENT LABS AND TESTS: BMET    Component Value Date/Time   NA 141 02/26/2019 0858   K 4.2 02/26/2019 0858   CL 102 02/26/2019 0858   CO2 27 02/26/2019 0858   GLUCOSE 97 02/26/2019 0858   GLUCOSE 113 (H) 11/24/2017 0230   BUN 12 02/26/2019 0858   CREATININE 0.83 02/26/2019 0858   CALCIUM 9.4 02/26/2019 0858   GFRNONAA 68 02/26/2019 0858   GFRAA 78 02/26/2019 0858   Lab Results  Component Value Date   HGBA1C 5.6 02/26/2019   HGBA1C 5.5 03/14/2016   Lab Results  Component Value Date   INSULIN 9.9 02/26/2019   CBC    Component Value Date/Time   WBC 5.6 01/16/2018 1624   WBC 6.4 11/24/2017 0230   RBC 4.04 01/16/2018 1624   RBC 4.10  11/24/2017 0230   HGB 12.6 01/16/2018 1624   HCT 37.7 01/16/2018 1624   PLT 198 01/16/2018 1624   MCV 93 01/16/2018 1624   MCH 31.2 01/16/2018 1624   MCH 31.2 11/24/2017 0230   MCHC 33.4 01/16/2018 1624   MCHC 32.5 11/24/2017 0230   RDW 12.8 01/16/2018 1624   LYMPHSABS 1.4 10/03/2016 1438   MONOABS 0.4 10/03/2016 1438   EOSABS 0.1 10/03/2016 1438   BASOSABS 0.0 10/03/2016 1438   Iron/TIBC/Ferritin/ %Sat No results found for: IRON, TIBC, FERRITIN, IRONPCTSAT Lipid Panel     Component Value Date/Time   CHOL 113 02/26/2019 0858   TRIG 60 02/26/2019 0858   HDL 56 02/26/2019 0858   CHOLHDL 3.2 03/15/2016 0524   VLDL 15 03/15/2016 0524   LDLCALC 44 02/26/2019 0858   Hepatic Function Panel     Component Value Date/Time   PROT 6.6 02/26/2019 0858   ALBUMIN 4.3 02/26/2019 0858   AST 35 02/26/2019 0858   ALT 14 02/26/2019 0858   ALKPHOS 84 02/26/2019 0858   BILITOT 0.3 02/26/2019 0858      Component Value Date/Time   TSH 5.105 (H) 12/15/2012 1208    OBESITY BEHAVIORAL INTERVENTION VISIT DOCUMENTATION FOR INSURANCE (~15 minutes)   ASK: We discussed the diagnosis of obesity with [name] today and Tyrisha agreed to give [place] permission to discuss obesity behavioral modification therapy today.  ASSESS: Danyiel has the diagnosis of obesity and her BMI today is 31.0. [place] is in the action stage of change.  ADVISE: Devlynn was educated on the multiple health risks of obesity as well as the benefit of weight loss to improve her health. She was advised of the need for long term treatment and the importance of lifestyle modifications to improve her current health and to decrease her risk of future health problems.  AGREE: Multiple dietary modification options and treatment options were discussed and  Chaunta agreed to follow the recommendations documented in the above note.  ARRANGE: Daanya was educated on the importance of frequent visits to treat obesity as outlined per CMS and USPSTF  guidelines and agreed to schedule her next follow up  appointment today.  Migdalia Dk, am acting as Location manager for [employer], DO  I have reviewed the above documentation for accuracy and completeness, and I agree with the above. -Jearld Lesch, DO

## 2019-04-03 ENCOUNTER — Encounter (INDEPENDENT_AMBULATORY_CARE_PROVIDER_SITE_OTHER): Payer: Self-pay | Admitting: Bariatrics

## 2019-04-03 DIAGNOSIS — L218 Other seborrheic dermatitis: Secondary | ICD-10-CM | POA: Diagnosis not present

## 2019-04-03 DIAGNOSIS — L718 Other rosacea: Secondary | ICD-10-CM | POA: Diagnosis not present

## 2019-04-03 DIAGNOSIS — L821 Other seborrheic keratosis: Secondary | ICD-10-CM | POA: Diagnosis not present

## 2019-04-03 DIAGNOSIS — L661 Lichen planopilaris: Secondary | ICD-10-CM | POA: Diagnosis not present

## 2019-04-10 ENCOUNTER — Ambulatory Visit (INDEPENDENT_AMBULATORY_CARE_PROVIDER_SITE_OTHER): Payer: PPO | Admitting: Psychology

## 2019-04-10 DIAGNOSIS — F4321 Adjustment disorder with depressed mood: Secondary | ICD-10-CM

## 2019-04-19 ENCOUNTER — Other Ambulatory Visit: Payer: Self-pay | Admitting: Cardiovascular Disease

## 2019-04-24 DIAGNOSIS — I10 Essential (primary) hypertension: Secondary | ICD-10-CM | POA: Diagnosis not present

## 2019-04-24 DIAGNOSIS — E7800 Pure hypercholesterolemia, unspecified: Secondary | ICD-10-CM | POA: Diagnosis not present

## 2019-04-24 DIAGNOSIS — I25119 Atherosclerotic heart disease of native coronary artery with unspecified angina pectoris: Secondary | ICD-10-CM | POA: Diagnosis not present

## 2019-04-24 DIAGNOSIS — I480 Paroxysmal atrial fibrillation: Secondary | ICD-10-CM | POA: Diagnosis not present

## 2019-04-24 DIAGNOSIS — E890 Postprocedural hypothyroidism: Secondary | ICD-10-CM | POA: Diagnosis not present

## 2019-04-30 ENCOUNTER — Encounter (INDEPENDENT_AMBULATORY_CARE_PROVIDER_SITE_OTHER): Payer: Self-pay | Admitting: Bariatrics

## 2019-04-30 ENCOUNTER — Other Ambulatory Visit: Payer: Self-pay

## 2019-04-30 ENCOUNTER — Ambulatory Visit (INDEPENDENT_AMBULATORY_CARE_PROVIDER_SITE_OTHER): Payer: PPO | Admitting: Bariatrics

## 2019-04-30 VITALS — BP 127/70 | HR 51 | Temp 98.8°F | Ht 62.0 in | Wt 170.0 lb

## 2019-04-30 DIAGNOSIS — E7800 Pure hypercholesterolemia, unspecified: Secondary | ICD-10-CM | POA: Diagnosis not present

## 2019-04-30 DIAGNOSIS — Z6831 Body mass index (BMI) 31.0-31.9, adult: Secondary | ICD-10-CM | POA: Diagnosis not present

## 2019-04-30 DIAGNOSIS — I10 Essential (primary) hypertension: Secondary | ICD-10-CM | POA: Diagnosis not present

## 2019-04-30 DIAGNOSIS — E6609 Other obesity due to excess calories: Secondary | ICD-10-CM

## 2019-05-02 ENCOUNTER — Ambulatory Visit: Payer: PPO | Admitting: Psychology

## 2019-05-02 NOTE — Progress Notes (Addendum)
Chief Complaint:   OBESITY [name] is here to discuss her progress with her obesity treatment plan along with follow-up of her obesity related diagnoses. Michele Thompson is on the Category 1 Plan with Category 1 and 2 breakfast options and states she is following her eating plan approximately 0% of the time. [name] states she is exercising 0 minutes 0 times per week.  The visit was 20 minutes.   Today's visit was #: 4 Starting weight: 171 lbs Starting date: 02/26/2019 Today's weight: 170 lbs Today's date: 04/30/2019 Total lbs lost to date: 1 Total lbs lost since last in-office visit: 0  Interim History: Michele Thompson is up 1 lb. She states that she is not "eating properly." She reports doing well with her water intake.  Subjective:   Essential hypertension. Rilie is taking Cardizem, HCTZ, and lisinopril. This is well controlled.  Hypercholesteremia. Marita is taking Crestor.  Assessment/Plan:   Essential hypertension. [name] is working on healthy weight loss and exercise to improve blood pressure control. We will watch for signs of hypotension as she continues her lifestyle modifications. She will continue her medications.  Hypercholesteremia. Michele Thompson will continue her medications and will decrease saturated fats.  Class 1 obesity with serious comorbidity and body mass index (BMI) of 31.0 to 31.9 in adult, unspecified obesity type.  Michele Thompson is currently in the action stage of change. As such, her goal is to continue with weight loss efforts. She has agreed to on the Category 1 Plan with Category 1 and 2 breakfast options.  She will work on meal planning, intentional eating, and will not take snacks (cookies, chips).  We discussed the following exercise goals today: Older adults should follow the adult guidelines. When older adults cannot meet the adult guidelines, they should be as physically active as their abilities and conditions will allow.  Older adults should do exercises that maintain or improve balance  if they are at risk of falling.   We discussed the following behavioral modification strategies today: increasing lean protein intake, decreasing simple carbohydrates, decreasing eating out, no skipping meals, meal planning and cooking strategies, keeping healthy foods in the home and planning for success.  Michele Thompson has agreed to follow-up with our clinic in 2 weeks. She was informed of the importance of frequent follow-up visits to maximize her success with intensive lifestyle modifications for her multiple health conditions.   Objective:   Blood pressure 127/70, pulse (!) 51, temperature 98.8 F (37.1 C), temperature source Oral, height 5\' 2"  (1.575 m), weight 170 lb (77.1 kg), SpO2 99 %. Body mass index is 31.09 kg/m.  General: Cooperative, alert, well developed, in no acute distress. HEENT: Conjunctivae and lids unremarkable. Neck: No thyromegaly.  Cardiovascular: Regular rhythm.  Lungs: Normal work of breathing. Extremities: No edema.  Neurologic: No focal deficits.   Lab Results  Component Value Date   CREATININE 0.83 02/26/2019   BUN 12 02/26/2019   NA 141 02/26/2019   K 4.2 02/26/2019   CL 102 02/26/2019   CO2 27 02/26/2019   Lab Results  Component Value Date   ALT 14 02/26/2019   AST 35 02/26/2019   ALKPHOS 84 02/26/2019   BILITOT 0.3 02/26/2019   Lab Results  Component Value Date   HGBA1C 5.6 02/26/2019   HGBA1C 5.5 03/14/2016   Lab Results  Component Value Date   INSULIN 9.9 02/26/2019   Lab Results  Component Value Date   TSH 5.105 (H) 12/15/2012   Lab Results  Component Value Date   CHOL 113 02/26/2019   HDL 56 02/26/2019   LDLCALC 44 02/26/2019   TRIG 60 02/26/2019   CHOLHDL 3.2 03/15/2016   Lab Results  Component Value Date   WBC 5.6 01/16/2018   HGB 12.6 01/16/2018   HCT 37.7 01/16/2018   MCV 93 01/16/2018   PLT 198 01/16/2018   No results found for: IRON, TIBC, FERRITIN  Obesity Behavioral Intervention Documentation for  Insurance:   Approximately 15 minutes were spent on the discussion below.  ASK: We discussed the diagnosis of obesity with Michele Thompson today and Michele Thompson agreed to give [place] permission to discuss obesity behavioral modification therapy today.  ASSESS: Michele Thompson has the diagnosis of obesity and her BMI today is 31.1. [place] is in the action stage of change.   ADVISE: Michele Thompson was educated on the multiple health risks of obesity as well as the benefit of weight loss to improve her health. She was advised of the need for long term treatment and the importance of lifestyle modifications to improve her current health and to decrease her risk of future health problems.  AGREE: Multiple dietary modification options and treatment options were discussed and Michele Thompson agreed to follow the recommendations documented in the above note.  ARRANGE: Michele Thompson was educated on the importance of frequent visits to treat obesity as outlined per CMS and USPSTF guidelines and agreed to schedule her next follow up appointment today.  Attestation Statements:   Reviewed by clinician on day of visit: allergies, medications, problem list, medical history, surgical history, family history, social history, and previous encounter notes.  Her visit was 20 minutes.   Michele Thompson, am acting as Location manager for [employer], DO   I have reviewed the above documentation for accuracy and completeness, and I agree with the above. Jearld Lesch, DO

## 2019-05-03 ENCOUNTER — Encounter (INDEPENDENT_AMBULATORY_CARE_PROVIDER_SITE_OTHER): Payer: Self-pay | Admitting: Bariatrics

## 2019-05-03 NOTE — Addendum Note (Signed)
Addended by: Jearld Lesch A on: 05/03/2019 02:34 PM   Modules accepted: Level of Service

## 2019-05-10 ENCOUNTER — Other Ambulatory Visit: Payer: Self-pay

## 2019-05-10 ENCOUNTER — Ambulatory Visit (HOSPITAL_COMMUNITY)
Admission: RE | Admit: 2019-05-10 | Discharge: 2019-05-10 | Disposition: A | Discharge status: Home / Self Care | Payer: PPO | Source: Ambulatory Visit | Attending: Nurse Practitioner | Admitting: Nurse Practitioner

## 2019-05-10 ENCOUNTER — Encounter (HOSPITAL_COMMUNITY): Payer: Self-pay | Admitting: Nurse Practitioner

## 2019-05-10 VITALS — BP 148/64 | HR 58 | Ht 62.0 in | Wt 173.6 lb

## 2019-05-10 DIAGNOSIS — E039 Hypothyroidism, unspecified: Secondary | ICD-10-CM | POA: Diagnosis not present

## 2019-05-10 DIAGNOSIS — I2510 Atherosclerotic heart disease of native coronary artery without angina pectoris: Secondary | ICD-10-CM | POA: Diagnosis not present

## 2019-05-10 DIAGNOSIS — R0683 Snoring: Secondary | ICD-10-CM

## 2019-05-10 DIAGNOSIS — E785 Hyperlipidemia, unspecified: Secondary | ICD-10-CM | POA: Diagnosis not present

## 2019-05-10 DIAGNOSIS — I10 Essential (primary) hypertension: Secondary | ICD-10-CM | POA: Insufficient documentation

## 2019-05-10 DIAGNOSIS — Z86711 Personal history of pulmonary embolism: Secondary | ICD-10-CM | POA: Diagnosis not present

## 2019-05-10 DIAGNOSIS — I252 Old myocardial infarction: Secondary | ICD-10-CM | POA: Diagnosis not present

## 2019-05-10 DIAGNOSIS — Z79899 Other long term (current) drug therapy: Secondary | ICD-10-CM | POA: Diagnosis not present

## 2019-05-10 DIAGNOSIS — Z7901 Long term (current) use of anticoagulants: Secondary | ICD-10-CM | POA: Diagnosis not present

## 2019-05-10 DIAGNOSIS — Z79890 Hormone replacement therapy (postmenopausal): Secondary | ICD-10-CM | POA: Diagnosis not present

## 2019-05-10 DIAGNOSIS — I498 Other specified cardiac arrhythmias: Secondary | ICD-10-CM

## 2019-05-10 DIAGNOSIS — I480 Paroxysmal atrial fibrillation: Secondary | ICD-10-CM | POA: Insufficient documentation

## 2019-05-10 DIAGNOSIS — D6869 Other thrombophilia: Secondary | ICD-10-CM

## 2019-05-10 DIAGNOSIS — Z8249 Family history of ischemic heart disease and other diseases of the circulatory system: Secondary | ICD-10-CM | POA: Insufficient documentation

## 2019-05-10 LAB — TSH: TSH: 0.861 u[IU]/mL (ref 0.350–4.500)

## 2019-05-10 LAB — COMPREHENSIVE METABOLIC PANEL
ALT: 12 U/L (ref 0–44)
AST: 27 U/L (ref 15–41)
Albumin: 3.9 g/dL (ref 3.5–5.0)
Alkaline Phosphatase: 61 U/L (ref 38–126)
Anion gap: 10 (ref 5–15)
BUN: 19 mg/dL (ref 8–23)
CO2: 26 mmol/L (ref 22–32)
Calcium: 9.5 mg/dL (ref 8.9–10.3)
Chloride: 100 mmol/L (ref 98–111)
Creatinine, Ser: 0.79 mg/dL (ref 0.44–1.00)
GFR calc Af Amer: >60 mL/min (ref >60)
GFR calc non Af Amer: >60 mL/min (ref >60)
Glucose, Bld: 109 mg/dL — ABNORMAL HIGH (ref 70–99)
Potassium: 3.7 mmol/L (ref 3.5–5.1)
Sodium: 136 mmol/L (ref 135–145)
Total Bilirubin: 0.8 mg/dL (ref 0.3–1.2)
Total Protein: 6.4 g/dL — ABNORMAL LOW (ref 6.5–8.1)

## 2019-05-10 LAB — CBC
HCT: 39.5 % (ref 36.0–46.0)
Hemoglobin: 13 g/dL (ref 12.0–15.0)
MCH: 31 pg (ref 26.0–34.0)
MCHC: 32.9 g/dL (ref 30.0–36.0)
MCV: 94.3 fL (ref 80.0–100.0)
Platelets: 176 10*3/uL (ref 150–400)
RBC: 4.19 MIL/uL (ref 3.87–5.11)
RDW: 12 % (ref 11.5–15.5)
WBC: 5.9 10*3/uL (ref 4.0–10.5)
nRBC: 0 % (ref 0.0–0.2)

## 2019-05-10 MED ORDER — DILTIAZEM HCL 30 MG PO TABS: 30.0000 mg | ORAL_TABLET | Freq: Every day | ORAL | 1 refills | Status: DC

## 2019-05-10 MED ORDER — METOPROLOL SUCCINATE ER 25 MG PO TB24: 12.5000 mg | ORAL_TABLET | Freq: Every day | ORAL | 6 refills | Status: DC

## 2019-05-10 NOTE — Patient Instructions (Addendum)
Start Cardizem 30mg  once a day with supper.  Do not start metoprolol that was sent to pharmacy.

## 2019-05-10 NOTE — Progress Notes (Addendum)
Primary Care Physician: Kathyrn Lass, MD Referring Physician: self referred Cardiologist: Dr. Jannet Mantis Michele Thompson is a 79 y.o. female with a h/o paroxysmal afib and PAC's that is in the office today for feeling of fluttering in her chest upper abdominal area with occasional neck tightness which is at rest and not with exertion. She feels this more toward the end of the day and while trying to go to sleep.  She exercises almost daily and never has this sensation when exercising. She felt as though she may be in afib today  but she is in SR with no PC's noted. She did wear a monitor 12/2017 which showed SR with PAC's.   Denies a swallowing/indigestion issue, and again her sym[ptoms are not exertional. She  has h/o thyroid disease, on thyroid replacement and has not had blood work drawn in sometime. She has a CHA2DS2VASc score of 7. She continues on eliquis.   She does have h/o SSS had in the past had to have daily BB stopped due to  underlying condition issues. She does have 30 mg Cardizem but has not taken when she notices these symptoms.  She  also states that she is actively trying to lose weight but is not using any supplements that may cause tachycardia/palpitations .   Today, she denies symptoms of palpitations, chest pain, shortness of breath, orthopnea, PND, lower extremity edema, dizziness, presyncope, syncope, or neurologic sequela. The patient is tolerating medications without difficulties and is otherwise without complaint today.   Past Medical History:  Diagnosis Date  . Anxiety   . Aortic atherosclerosis ([place])   . Arthritis   . Constipation   . Coronary artery disease 03/16/2016   a. admx with angina and mildly elevated Troponin >> LHC: mLAD 30 >> CCB started for poss microvascular angina  . Depression   . Diverticulosis   . Edema of both lower extremities   . Fatty liver   . History of MI (myocardial infarction)   . Hyperlipidemia 03/16/2016  . Hypertension   .  Hypothyroidism   . Joint pain   . Paroxysmal A-fib ([place])   . PE (pulmonary thromboembolism) ([place]) 03/17/2016   Bilateral submassive pulmonary embolus with RUE and RLE DVT on venous duplex 02/2016 c/b PEA arrest // Echo 03/21/16: mild LVH, EF 60-65, no RWMA, Gr 1 DD, normal RVSF  . Psoas tendinitis   . Sinus bradycardia 03/31/2016  . Thrombocytopenia ([place]) 02/2016   Profound thrombocytopenia noted upon admission for pulmonary embolism >> initially thought to be from Hep induced thrombocytopenia given recent admit for cardiac cath and Heparin use // However, HIT Ab was neg (0.184) // Hematology consult pending  . Thyroid disease   . Unilateral primary osteoarthritis, right knee   . Vascular malformation    mid ascending colon  . Vitamin D deficiency    Past Surgical History:  Procedure Laterality Date  . CARDIAC CATHETERIZATION N/A 03/15/2016   Procedure: Left Heart Cath and Coronary Angiography;  Surgeon: [name], MD;  Location: [facility];  Service: Cardiovascular;  Laterality: N/A;  . TOTAL KNEE ARTHROPLASTY Right 12/27/2016   Procedure: RIGHT TOTAL KNEE ARTHROPLASTY;  Surgeon: Gaynelle Arabian, MD;  Location: [facility];  Service: Orthopedics;  Laterality: Right;  Marland Kitchen VAGINAL HYSTERECTOMY  1983   endometriosis    Current Outpatient Medications  Medication Sig Dispense Refill  . betamethasone dipropionate 0.05 % lotion Apply topically 2 (two) times daily.    . Cholecalciferol (VITAMIN D3) 2000  units TABS Take 1 tablet by mouth daily.    Marland Kitchen diltiazem (CARDIZEM) 30 MG tablet Take 1 tablet every 4 hours AS NEEDED for AFIB heart rate >100 as long as top number of blood pressure >100. 45 tablet 1  . doxycycline (VIBRAMYCIN) 50 MG capsule Take 50 mg by mouth daily.    Marland Kitchen ELIQUIS 5 MG TABS tablet TAKE 1 TABLET BY MOUTH TWICE DAILY. 60 tablet 0  . gabapentin (NEURONTIN) 300 MG capsule Take 1 capsule by mouth at bedtime.    . hydrochlorothiazide (MICROZIDE) 12.5 MG capsule Take 12.5 mg by  mouth daily.    . Influenza vac split quadrivalent PF (FLUZONE HIGH-DOSE) 0.5 ML injection Fluzone High-Dose 2019-20 (PF) 180 mcg/0.5 mL intramuscular syringe    . levothyroxine (SYNTHROID) 75 MCG tablet Take 75 mcg by mouth daily.    Marland Kitchen lisinopril (PRINIVIL,ZESTRIL) 40 MG tablet TAKE 1 TABLET EACH DAY. 90 tablet 3  . metoprolol succinate (TOPROL XL) 25 MG 24 hr tablet Take 0.5 tablets (12.5 mg total) by mouth daily after supper. 15 tablet 6  . rosuvastatin (CRESTOR) 20 MG tablet Take 1 tablet (20 mg total) by mouth daily. 90 tablet 3   Current Facility-Administered Medications  Medication Dose Route Frequency Provider Last Rate Last Admin  . 0.9 %  sodium chloride infusion  500 mL Intravenous Once Nandigam, Kavitha V, MD      . metroNIDAZOLE (METROCREAM) 0.75 % cream   Topical BID Virgie Dad, MD        No Known Allergies  Social History   Socioeconomic History  . Marital status: Widowed    Spouse name: Not on file  . Number of children: Not on file  . Years of education: Not on file  . Highest education level: Not on file  Occupational History  . Occupation: retired  Tobacco Use  . Smoking status: Never Smoker  . Smokeless tobacco: Never Used  Substance and Sexual Activity  . Alcohol use: No    Alcohol/week: 0.0 standard drinks  . Drug use: No  . Sexual activity: Never    Birth control/protection: Surgical    Comment: 1st intercourse 79 yo-Fewer than 5 partners  Other Topics Concern  . Not on file  Social History Narrative   [facility]:   Originally from [place]. Moved to [place] in 2012. 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   Social Determinants of Health   Financial Resource Strain:   . Difficulty of Paying Living Expenses: Not on file  Food Insecurity:   . Worried About Charity fundraiser in the Last Year: Not on file  . Ran Out of Food in the Last Year: Not on file  Transportation Needs:   .  Lack of Transportation (Medical): Not on file  . Lack of Transportation (Non-Medical): Not on file  Physical Activity:   . Days of Exercise per Week: Not on file  . Minutes of Exercise per Session: Not on file  Stress:   . Feeling of Stress : Not on file  Social Connections:   . Frequency of Communication with Friends and Family: Not on file  . Frequency of Social Gatherings with Friends and Family: Not on file  . Attends Religious Services: Not on file  . Active Member of Clubs or Organizations: Not on file  . Attends Archivist Meetings: Not on file  . Marital Status: Not on file  Intimate Partner Violence:   .  Fear of Current or Ex-Partner: Not on file  . Emotionally Abused: Not on file  . Physically Abused: Not on file  . Sexually Abused: Not on file    Family History  Problem Relation Age of Onset  . Heart failure Mother   . Heart disease Mother   . Heart failure Father   . High blood pressure Father   . Stroke Father   . Heart disease Father   . Heart failure Maternal Grandmother   . Myasthenia gravis Brother   . Stroke Brother   . Clotting disorder Neg Hx   . Colon cancer Neg Hx     ROS- All systems are reviewed and negative except as per the HPI above  Physical Exam: Vitals:   05/10/19 1541  BP: (!) 148/64  Pulse: (!) 58  Weight: 78.7 kg  Height: 5\' 2"  (1.575 m)   Wt Readings from Last 3 Encounters:  05/10/19 78.7 kg  04/30/19 77.1 kg  04/02/19 76.7 kg    Labs: Lab Results  Component Value Date   NA 141 02/26/2019   K 4.2 02/26/2019   CL 102 02/26/2019   CO2 27 02/26/2019   GLUCOSE 97 02/26/2019   BUN 12 02/26/2019   CREATININE 0.83 02/26/2019   CALCIUM 9.4 02/26/2019   PHOS 2.6 03/20/2016   MG 2.3 11/24/2017   Lab Results  Component Value Date   INR 0.90 11/24/2017   Lab Results  Component Value Date   CHOL 113 02/26/2019   HDL 56 02/26/2019   LDLCALC 44 02/26/2019   TRIG 60 02/26/2019     GEN- The patient is well  appearing, alert and oriented x 3 today.   Head- normocephalic, atraumatic Eyes-  Sclera clear, conjunctiva pink Ears- hearing intact Oropharynx- clear Neck- supple, no JVP Lymph- no cervical lymphadenopathy Lungs- Clear to ausculation bilaterally, normal work of breathing Heart- Regular rate and rhythm, no murmurs, rubs or gallops, PMI not laterally displaced GI- soft, NT, ND, + BS Extremities- no clubbing, cyanosis, or edema MS- no significant deformity or atrophy Skin- no rash or lesion Psych- euthymic mood, full affect Neuro- strength and sensation are intact  EKG-Sinus brady at 58 bpm  Epic records reviewed    Assessment and Plan: 1. Fluttering sensation in upper abdomen/chest/neck  Her symptoms are somewhat vague and non specific but I know that she has had PAC's noted on monitor and she describes these more so at the end of the day when she is quiet or trying to sleep, not when busy or distracted, which does sound like premature  Contractions Denies wearing  repeat monitor  SInce she has SSS and cannot take daily rate control, she can try starting with half of an cardizem 30 mg( to a full tab)  at suppertime as she notices the symptoms more in the evening  If PAC's, hopefully will smooth them out without causing any significant bradycardia  I will also check CBC/TSH/cmet today   2. Possible sleep apnea Describes at night waking up feeling sob and raising  the head of her bed at which time she can go back to sleep She has been told she snored in the past but now is alone at night  Will order sleep study   3. CHA2DS2VASc score of 7 Continue  eliquis 5 mg bid   F/u here in 2 weeks   Butch Penny C. , [facility] [address] [place], [place] [address] [phone]

## 2019-05-10 NOTE — Addendum Note (Signed)
Encounter addended by: Sherran Needs, NP on: 05/10/2019 4:50 PM  Actions taken: Clinical Note Signed

## 2019-05-11 NOTE — Progress Notes (Signed)
Thanks, Michele Thompson 

## 2019-05-14 ENCOUNTER — Other Ambulatory Visit: Payer: Self-pay

## 2019-05-14 ENCOUNTER — Telehealth: Payer: Self-pay | Admitting: *Deleted

## 2019-05-14 ENCOUNTER — Encounter (INDEPENDENT_AMBULATORY_CARE_PROVIDER_SITE_OTHER): Payer: Self-pay | Admitting: Family Medicine

## 2019-05-14 ENCOUNTER — Ambulatory Visit (INDEPENDENT_AMBULATORY_CARE_PROVIDER_SITE_OTHER): Payer: PPO | Admitting: Family Medicine

## 2019-05-14 VITALS — BP 115/65 | HR 69 | Temp 98.3°F | Ht 62.0 in | Wt 169.0 lb

## 2019-05-14 DIAGNOSIS — E8881 Metabolic syndrome: Secondary | ICD-10-CM

## 2019-05-14 DIAGNOSIS — Z6830 Body mass index (BMI) 30.0-30.9, adult: Secondary | ICD-10-CM | POA: Diagnosis not present

## 2019-05-14 DIAGNOSIS — E669 Obesity, unspecified: Secondary | ICD-10-CM | POA: Diagnosis not present

## 2019-05-14 NOTE — Telephone Encounter (Signed)
Left message to return a call to receive sleep study appointment details. 

## 2019-05-15 NOTE — Progress Notes (Signed)
Chief Complaint:   OBESITY Michele Thompson is here to discuss her progress with her obesity treatment plan along with follow-up of her obesity related diagnoses. Michele Thompson is on the Category 1 Plan and states she is following her eating plan approximately 2% of the time. Michele Thompson states she is walking and exercising with exercise equipment 30 minutes 3 times per week.  Today's visit was #: 5 Starting weight: 171 lbs Starting date: 02/26/2019 Today's weight: 169 lbs Today's date: 05/14/2019 Total lbs lost to date: 2 Total lbs lost since last in-office visit: 1  Interim History: Michele Thompson reports craving sugar. She lives at [facility] and her lunch is provided by them daily which sometimes makes it hard to stick to plan. She is trying to limit carbohydrates. Michele Thompson has her largest meal at lunch. She is tracking her protein on Lose It (tracking app). She snacks after lunch and she has no evening meal. She only has 3 ounces of protein at lunch.  Subjective:   Insulin resistance Michele Thompson has a diagnosis of insulin resistance based on her elevated fasting insulin level >5. Her last A1c was 5.6 (02/26/19). She continues to work on diet and exercise to decrease her risk of diabetes. Kearah admits to cravings for sweets.  Lab Results  Component Value Date   INSULIN 9.9 02/26/2019   Lab Results  Component Value Date   HGBA1C 5.6 02/26/2019    Assessment/Plan:   Insulin resistance Michele Thompson will continue with the meal plan and she will continue to work on weight loss, exercise, and decreasing simple carbohydrates to help decrease the risk of diabetes. Michele Thompson agreed to follow-up with [place] as directed to closely monitor her progress.  Class 1 obesity with serious comorbidity and body mass index (BMI) of 30.0 to 30.9 in adult, unspecified obesity type Michele Thompson is currently in the action stage of change. As such, her goal is to continue with weight loss efforts. She has agreed to the Category 1 Plan with 70 grams of protein.    Exercise goals: Michele Thompson will continue walking and exercising with exercise equipment for 30 minutes, 3 times per week.  Behavioral modification strategies: increasing lean protein intake, decreasing simple carbohydrates, keeping healthy foods in the home, better snacking choices and planning for success.  Michele Thompson will increase protein to 70 grams per day. I advised her that she needs to increase protein to 6 oz at lunch since this is her largest meal of the day.   Michele Thompson has agreed to follow-up with our clinic in 3 weeks. She was informed of the importance of frequent follow-up visits to maximize her success with intensive lifestyle modifications for her multiple health conditions.   Objective:   Blood pressure 115/65, pulse 69, temperature 98.3 F (36.8 C), temperature source Oral, height 5\' 2"  (1.575 m), weight 169 lb (76.7 kg), SpO2 100 %. Body mass index is 30.91 kg/m.  General: Cooperative, alert, well developed, in no acute distress. HEENT: Conjunctivae and lids unremarkable. Cardiovascular: Regular rhythm.  Lungs: Normal work of breathing. Neurologic: No focal deficits.   Lab Results  Component Value Date   CREATININE 0.79 05/10/2019   BUN 19 05/10/2019   NA 136 05/10/2019   K 3.7 05/10/2019   CL 100 05/10/2019   CO2 26 05/10/2019   Lab Results  Component Value Date   ALT 12 05/10/2019   AST 27 05/10/2019   ALKPHOS 61 05/10/2019   BILITOT 0.8 05/10/2019   Lab Results  Component Value Date  HGBA1C 5.6 02/26/2019   HGBA1C 5.5 03/14/2016   Lab Results  Component Value Date   INSULIN 9.9 02/26/2019   Lab Results  Component Value Date   TSH 0.861 05/10/2019   Lab Results  Component Value Date   CHOL 113 02/26/2019   HDL 56 02/26/2019   LDLCALC 44 02/26/2019   TRIG 60 02/26/2019   CHOLHDL 3.2 03/15/2016   Lab Results  Component Value Date   WBC 5.9 05/10/2019   HGB 13.0 05/10/2019   HCT 39.5 05/10/2019   MCV 94.3 05/10/2019   PLT 176 05/10/2019   No  results found for: IRON, TIBC, FERRITIN  Obesity Behavioral Intervention Documentation for Insurance:   Approximately 15 minutes were spent on the discussion below.  ASK: We discussed the diagnosis of obesity with Michele Thompson today and Michele Thompson agreed to give [place] permission to discuss obesity behavioral modification therapy today.  ASSESS: Zanasia has the diagnosis of obesity and her BMI today is 30.9. Manjit is in the action stage of change.   ADVISE: Eulogia was educated on the multiple health risks of obesity as well as the benefit of weight loss to improve her health. She was advised of the need for long term treatment and the importance of lifestyle modifications to improve her current health and to decrease her risk of future health problems.  AGREE: Multiple dietary modification options and treatment options were discussed and Michele Thompson agreed to follow the recommendations documented in the above note.  ARRANGE: Michele Thompson was educated on the importance of frequent visits to treat obesity as outlined per CMS and USPSTF guidelines and agreed to schedule her next follow up appointment today.  Attestation Statements:   Reviewed by clinician on day of visit: allergies, medications, problem list, medical history, surgical history, family history, social history, and previous encounter notes.  Corey Skains, am acting as Location manager for [employer], FNP-C.  I have reviewed the above documentation for accuracy and completeness, and I agree with the above. -   [employer], FNP-C

## 2019-05-16 DIAGNOSIS — E88819 Insulin resistance, unspecified: Secondary | ICD-10-CM | POA: Insufficient documentation

## 2019-05-16 DIAGNOSIS — E8881 Metabolic syndrome: Secondary | ICD-10-CM | POA: Insufficient documentation

## 2019-05-16 NOTE — Addendum Note (Signed)
Encounter addended by: Sherran Needs, NP on: 05/16/2019 10:19 AM  Actions taken: Level of Service modified, Visit diagnoses modified

## 2019-05-18 ENCOUNTER — Other Ambulatory Visit: Payer: Self-pay | Admitting: Cardiovascular Disease

## 2019-05-23 ENCOUNTER — Telehealth (HOSPITAL_COMMUNITY): Payer: PPO | Admitting: Nurse Practitioner

## 2019-05-24 ENCOUNTER — Other Ambulatory Visit (HOSPITAL_COMMUNITY): Payer: PPO

## 2019-05-26 ENCOUNTER — Encounter (HOSPITAL_BASED_OUTPATIENT_CLINIC_OR_DEPARTMENT_OTHER): Payer: PPO | Admitting: Cardiovascular Disease

## 2019-06-04 ENCOUNTER — Encounter (INDEPENDENT_AMBULATORY_CARE_PROVIDER_SITE_OTHER): Payer: Self-pay | Admitting: Family Medicine

## 2019-06-04 ENCOUNTER — Other Ambulatory Visit: Payer: Self-pay

## 2019-06-04 ENCOUNTER — Ambulatory Visit (INDEPENDENT_AMBULATORY_CARE_PROVIDER_SITE_OTHER): Payer: PPO | Admitting: Family Medicine

## 2019-06-04 VITALS — BP 104/66 | HR 54 | Temp 97.6°F | Ht 62.0 in | Wt 170.0 lb

## 2019-06-04 DIAGNOSIS — E8881 Metabolic syndrome: Secondary | ICD-10-CM | POA: Diagnosis not present

## 2019-06-04 DIAGNOSIS — Z6831 Body mass index (BMI) 31.0-31.9, adult: Secondary | ICD-10-CM

## 2019-06-04 DIAGNOSIS — E669 Obesity, unspecified: Secondary | ICD-10-CM | POA: Diagnosis not present

## 2019-06-05 NOTE — Progress Notes (Signed)
Chief Complaint:   OBESITY Michele Thompson is here to discuss her progress with her obesity treatment plan along with follow-up of her obesity related diagnoses. Iqlas is on the Category 1 Plan and states she is following her eating plan approximately 50% of the time. Arlette states she is exercising in the exercise room and walking for 30 to 45 minutes 3 times per week.  Today's visit was #: 6 Starting weight: 171 lbs Starting date: 02/26/2019 Today's weight: 170 lbs Today's date: 06/04/2019 Total lbs lost to date: 1 Total lbs lost since last in-office visit: 0  Interim History: Jerissa has been tracking her protein and she usually gets around 65 grams of protein per day. She is weighing the meat at lunch. She admits to eating too much candy over the past few weeks. She is requesting a letter to give to her independent living facility to enable her to get an extra serving of protein and vegetables with her meal.   Subjective:   Insulin resistance Cennie has a diagnosis of insulin resistance based on her elevated fasting insulin level >5. She continues to work on diet and exercise to decrease her risk of diabetes. Ozzie reports polyphagia. She is not on metformin. Her last A1c was at 5.6 (02/26/19).  Lab Results  Component Value Date   INSULIN 9.9 02/26/2019   Lab Results  Component Value Date   HGBA1C 5.6 02/26/2019   Assessment/Plan:   Insulin resistance Thekla will continue with the meal plan and she will continue to work on weight loss, exercise, and decreasing simple carbohydrates to help decrease the risk of diabetes. Kearah agreed to follow-up with [place] as directed to closely monitor her progress.  Class 1 obesity with serious comorbidity and body mass index (BMI) of 31.0 to 31.9 in adult, unspecified obesity type [place] is currently in the action stage of change. As such, her goal is to continue with weight loss efforts. She has agreed to the Category 1 Plan. She will continue to journal her  protein with a goal of 70 gms per day.  Letter was provided to patient for her independent living requesting them  to provide extra meat and vegetables at lunch.  Exercise goals: Kenna will continue her current exercise regimen.  Behavioral modification strategies: increasing lean protein intake, decreasing simple carbohydrates, increasing vegetables and planning for success.  Yulianna has agreed to follow-up with our clinic in 2 weeks. She was informed of the importance of frequent follow-up visits to maximize her success with intensive lifestyle modifications for her multiple health conditions.   Objective:   Blood pressure 104/66, pulse (!) 54, temperature 97.6 F (36.4 C), temperature source Oral, height 5\' 2"  (1.575 m), weight 170 lb (77.1 kg), SpO2 98 %. Body mass index is 31.09 kg/m.  General: Cooperative, alert, well developed, in no acute distress. HEENT: Conjunctivae and lids unremarkable. Cardiovascular: Regular rhythm.  Lungs: Normal work of breathing. Neurologic: No focal deficits.   Lab Results  Component Value Date   CREATININE 0.79 05/10/2019   BUN 19 05/10/2019   NA 136 05/10/2019   K 3.7 05/10/2019   CL 100 05/10/2019   CO2 26 05/10/2019   Lab Results  Component Value Date   ALT 12 05/10/2019   AST 27 05/10/2019   ALKPHOS 61 05/10/2019   BILITOT 0.8 05/10/2019   Lab Results  Component Value Date   HGBA1C 5.6 02/26/2019   HGBA1C 5.5 03/14/2016   Lab Results  Component Value Date  INSULIN 9.9 02/26/2019   Lab Results  Component Value Date   TSH 0.861 05/10/2019   Lab Results  Component Value Date   CHOL 113 02/26/2019   HDL 56 02/26/2019   LDLCALC 44 02/26/2019   TRIG 60 02/26/2019   CHOLHDL 3.2 03/15/2016   Lab Results  Component Value Date   WBC 5.9 05/10/2019   HGB 13.0 05/10/2019   HCT 39.5 05/10/2019   MCV 94.3 05/10/2019   PLT 176 05/10/2019   No results found for: IRON, TIBC, FERRITIN   Ref. Range 02/26/2019 08:58  Vitamin D,  25-Hydroxy Latest Ref Range: 30.0 - 100.0 ng/mL 43.3   Obesity Behavioral Intervention Documentation for Insurance:   Approximately 15 minutes were spent on the discussion below.  ASK: We discussed the diagnosis of obesity with Lelon Frohlich today and Raengel agreed to give [place] permission to discuss obesity behavioral modification therapy today.  ASSESS: Raneshia has the diagnosis of obesity and her BMI today is 31.09. Breonca is in the action stage of change.   ADVISE: Britley was educated on the multiple health risks of obesity as well as the benefit of weight loss to improve her health. She was advised of the need for long term treatment and the importance of lifestyle modifications to improve her current health and to decrease her risk of future health problems.  AGREE: Multiple dietary modification options and treatment options were discussed and Merril agreed to follow the recommendations documented in the above note.  ARRANGE: Diandra was educated on the importance of frequent visits to treat obesity as outlined per CMS and USPSTF guidelines and agreed to schedule her next follow up appointment today.  Attestation Statements:   Reviewed by clinician on day of visit: allergies, medications, problem list, medical history, surgical history, family history, social history, and previous encounter notes.  Corey Skains, am acting as Location manager for [employer], FNP-C.  I have reviewed the above documentation for accuracy and completeness, and I agree with the above. -   [employer], FNP-C

## 2019-06-06 ENCOUNTER — Encounter (INDEPENDENT_AMBULATORY_CARE_PROVIDER_SITE_OTHER): Payer: Self-pay | Admitting: Family Medicine

## 2019-06-18 ENCOUNTER — Ambulatory Visit (INDEPENDENT_AMBULATORY_CARE_PROVIDER_SITE_OTHER): Payer: PPO | Admitting: Family Medicine

## 2019-06-28 ENCOUNTER — Ambulatory Visit (INDEPENDENT_AMBULATORY_CARE_PROVIDER_SITE_OTHER): Payer: PPO | Admitting: Family Medicine

## 2019-06-29 DIAGNOSIS — I700 Atherosclerosis of aorta: Secondary | ICD-10-CM | POA: Diagnosis not present

## 2019-06-29 DIAGNOSIS — E890 Postprocedural hypothyroidism: Secondary | ICD-10-CM | POA: Diagnosis not present

## 2019-06-29 DIAGNOSIS — E0500 Thyrotoxicosis with diffuse goiter without thyrotoxic crisis or storm: Secondary | ICD-10-CM | POA: Diagnosis not present

## 2019-06-29 DIAGNOSIS — Z7901 Long term (current) use of anticoagulants: Secondary | ICD-10-CM | POA: Diagnosis not present

## 2019-06-29 DIAGNOSIS — Z86711 Personal history of pulmonary embolism: Secondary | ICD-10-CM | POA: Diagnosis not present

## 2019-06-29 DIAGNOSIS — D126 Benign neoplasm of colon, unspecified: Secondary | ICD-10-CM | POA: Diagnosis not present

## 2019-06-29 DIAGNOSIS — E785 Hyperlipidemia, unspecified: Secondary | ICD-10-CM | POA: Diagnosis not present

## 2019-06-29 DIAGNOSIS — I10 Essential (primary) hypertension: Secondary | ICD-10-CM | POA: Diagnosis not present

## 2019-06-29 DIAGNOSIS — I2510 Atherosclerotic heart disease of native coronary artery without angina pectoris: Secondary | ICD-10-CM | POA: Diagnosis not present

## 2019-06-29 DIAGNOSIS — K760 Fatty (change of) liver, not elsewhere classified: Secondary | ICD-10-CM | POA: Diagnosis not present

## 2019-06-29 DIAGNOSIS — I4891 Unspecified atrial fibrillation: Secondary | ICD-10-CM | POA: Diagnosis not present

## 2019-06-29 DIAGNOSIS — D7582 Heparin induced thrombocytopenia (HIT): Secondary | ICD-10-CM | POA: Diagnosis not present

## 2019-07-04 DIAGNOSIS — I480 Paroxysmal atrial fibrillation: Secondary | ICD-10-CM | POA: Diagnosis not present

## 2019-07-04 DIAGNOSIS — I25119 Atherosclerotic heart disease of native coronary artery with unspecified angina pectoris: Secondary | ICD-10-CM | POA: Diagnosis not present

## 2019-07-04 DIAGNOSIS — I10 Essential (primary) hypertension: Secondary | ICD-10-CM | POA: Diagnosis not present

## 2019-07-04 DIAGNOSIS — E7800 Pure hypercholesterolemia, unspecified: Secondary | ICD-10-CM | POA: Diagnosis not present

## 2019-07-04 DIAGNOSIS — E890 Postprocedural hypothyroidism: Secondary | ICD-10-CM | POA: Diagnosis not present

## 2019-07-07 ENCOUNTER — Other Ambulatory Visit: Payer: Self-pay | Admitting: Cardiovascular Disease

## 2019-09-14 ENCOUNTER — Other Ambulatory Visit: Payer: Self-pay | Admitting: Family Medicine

## 2019-09-14 DIAGNOSIS — Z1231 Encounter for screening mammogram for malignant neoplasm of breast: Secondary | ICD-10-CM

## 2019-10-04 DIAGNOSIS — R600 Localized edema: Secondary | ICD-10-CM | POA: Diagnosis not present

## 2019-10-04 DIAGNOSIS — I8312 Varicose veins of left lower extremity with inflammation: Secondary | ICD-10-CM | POA: Diagnosis not present

## 2019-10-04 DIAGNOSIS — I8311 Varicose veins of right lower extremity with inflammation: Secondary | ICD-10-CM | POA: Diagnosis not present

## 2019-10-05 ENCOUNTER — Other Ambulatory Visit: Payer: Self-pay | Admitting: Cardiovascular Disease

## 2019-10-29 ENCOUNTER — Other Ambulatory Visit: Payer: Self-pay

## 2019-10-29 ENCOUNTER — Ambulatory Visit
Admission: RE | Admit: 2019-10-29 | Discharge: 2019-10-29 | Disposition: A | Discharge status: Home / Self Care | Payer: PPO | Source: Ambulatory Visit | Attending: Family Medicine | Admitting: Family Medicine

## 2019-10-29 DIAGNOSIS — Z1231 Encounter for screening mammogram for malignant neoplasm of breast: Secondary | ICD-10-CM | POA: Diagnosis not present

## 2019-11-02 ENCOUNTER — Other Ambulatory Visit: Payer: Self-pay

## 2019-11-02 MED ORDER — APIXABAN 5 MG PO TABS: 5.0000 mg | ORAL_TABLET | Freq: Two times a day (BID) | ORAL | 1 refills | Status: DC

## 2020-01-02 DIAGNOSIS — I4891 Unspecified atrial fibrillation: Secondary | ICD-10-CM | POA: Diagnosis not present

## 2020-01-02 DIAGNOSIS — I10 Essential (primary) hypertension: Secondary | ICD-10-CM | POA: Diagnosis not present

## 2020-01-02 DIAGNOSIS — I2510 Atherosclerotic heart disease of native coronary artery without angina pectoris: Secondary | ICD-10-CM | POA: Diagnosis not present

## 2020-01-02 DIAGNOSIS — Z86711 Personal history of pulmonary embolism: Secondary | ICD-10-CM | POA: Diagnosis not present

## 2020-01-02 DIAGNOSIS — Z7901 Long term (current) use of anticoagulants: Secondary | ICD-10-CM | POA: Diagnosis not present

## 2020-01-02 DIAGNOSIS — E890 Postprocedural hypothyroidism: Secondary | ICD-10-CM | POA: Diagnosis not present

## 2020-01-02 DIAGNOSIS — E7849 Other hyperlipidemia: Secondary | ICD-10-CM | POA: Diagnosis not present

## 2020-01-02 DIAGNOSIS — I700 Atherosclerosis of aorta: Secondary | ICD-10-CM | POA: Diagnosis not present

## 2020-01-02 DIAGNOSIS — K760 Fatty (change of) liver, not elsewhere classified: Secondary | ICD-10-CM | POA: Diagnosis not present

## 2020-01-02 DIAGNOSIS — D126 Benign neoplasm of colon, unspecified: Secondary | ICD-10-CM | POA: Diagnosis not present

## 2020-01-02 DIAGNOSIS — Z23 Encounter for immunization: Secondary | ICD-10-CM | POA: Diagnosis not present

## 2020-01-08 ENCOUNTER — Other Ambulatory Visit: Payer: Self-pay | Admitting: Cardiovascular Disease

## 2020-02-04 DIAGNOSIS — Z6832 Body mass index (BMI) 32.0-32.9, adult: Secondary | ICD-10-CM | POA: Diagnosis not present

## 2020-02-04 DIAGNOSIS — E669 Obesity, unspecified: Secondary | ICD-10-CM | POA: Diagnosis not present

## 2020-02-04 DIAGNOSIS — F5101 Primary insomnia: Secondary | ICD-10-CM | POA: Diagnosis not present

## 2020-02-04 DIAGNOSIS — E7800 Pure hypercholesterolemia, unspecified: Secondary | ICD-10-CM | POA: Diagnosis not present

## 2020-02-04 DIAGNOSIS — M1712 Unilateral primary osteoarthritis, left knee: Secondary | ICD-10-CM | POA: Diagnosis not present

## 2020-02-04 DIAGNOSIS — I10 Essential (primary) hypertension: Secondary | ICD-10-CM | POA: Diagnosis not present

## 2020-02-07 DIAGNOSIS — H5212 Myopia, left eye: Secondary | ICD-10-CM | POA: Diagnosis not present

## 2020-02-07 DIAGNOSIS — H524 Presbyopia: Secondary | ICD-10-CM | POA: Diagnosis not present

## 2020-02-07 DIAGNOSIS — H52202 Unspecified astigmatism, left eye: Secondary | ICD-10-CM | POA: Diagnosis not present

## 2020-02-07 DIAGNOSIS — H5201 Hypermetropia, right eye: Secondary | ICD-10-CM | POA: Diagnosis not present

## 2020-02-07 DIAGNOSIS — Z961 Presence of intraocular lens: Secondary | ICD-10-CM | POA: Diagnosis not present

## 2020-03-12 DIAGNOSIS — E890 Postprocedural hypothyroidism: Secondary | ICD-10-CM | POA: Diagnosis not present

## 2020-03-12 DIAGNOSIS — M1712 Unilateral primary osteoarthritis, left knee: Secondary | ICD-10-CM | POA: Diagnosis not present

## 2020-03-12 DIAGNOSIS — I25119 Atherosclerotic heart disease of native coronary artery with unspecified angina pectoris: Secondary | ICD-10-CM | POA: Diagnosis not present

## 2020-03-12 DIAGNOSIS — I10 Essential (primary) hypertension: Secondary | ICD-10-CM | POA: Diagnosis not present

## 2020-03-12 DIAGNOSIS — E7800 Pure hypercholesterolemia, unspecified: Secondary | ICD-10-CM | POA: Diagnosis not present

## 2020-03-12 DIAGNOSIS — I480 Paroxysmal atrial fibrillation: Secondary | ICD-10-CM | POA: Diagnosis not present

## 2020-04-04 ENCOUNTER — Other Ambulatory Visit: Payer: Self-pay | Admitting: Cardiovascular Disease

## 2020-04-07 ENCOUNTER — Other Ambulatory Visit: Payer: Self-pay | Admitting: Cardiovascular Disease

## 2020-04-30 ENCOUNTER — Other Ambulatory Visit: Payer: Self-pay

## 2020-04-30 ENCOUNTER — Ambulatory Visit: Payer: PPO | Admitting: Cardiovascular Disease

## 2020-04-30 ENCOUNTER — Encounter: Payer: Self-pay | Admitting: Cardiovascular Disease

## 2020-04-30 VITALS — BP 114/62 | HR 66 | Ht 62.0 in

## 2020-04-30 DIAGNOSIS — I25111 Atherosclerotic heart disease of native coronary artery with angina pectoris with documented spasm: Secondary | ICD-10-CM

## 2020-04-30 DIAGNOSIS — Z86711 Personal history of pulmonary embolism: Secondary | ICD-10-CM | POA: Diagnosis not present

## 2020-04-30 DIAGNOSIS — I10 Essential (primary) hypertension: Secondary | ICD-10-CM | POA: Diagnosis not present

## 2020-04-30 DIAGNOSIS — E7800 Pure hypercholesterolemia, unspecified: Secondary | ICD-10-CM | POA: Diagnosis not present

## 2020-04-30 DIAGNOSIS — I2699 Other pulmonary embolism without acute cor pulmonale: Secondary | ICD-10-CM

## 2020-04-30 DIAGNOSIS — I495 Sick sinus syndrome: Secondary | ICD-10-CM

## 2020-04-30 DIAGNOSIS — Z7901 Long term (current) use of anticoagulants: Secondary | ICD-10-CM | POA: Diagnosis not present

## 2020-04-30 DIAGNOSIS — I480 Paroxysmal atrial fibrillation: Secondary | ICD-10-CM | POA: Diagnosis not present

## 2020-04-30 NOTE — Patient Instructions (Signed)

## 2020-04-30 NOTE — Progress Notes (Signed)
.  Cardiology Office Note:    Date:  05/05/2020   ID:  Michele Thompson, DOB [birth year], MRN [id number]  PCP:  Kathyrn Lass, MD  Cardiologist:  Sanda Klein, MD   Referring MD: Kathyrn Lass, MD   Chief Complaint  Patient presents with  . Follow-up    12 months.    History of Present Illness:    Michele Thompson is a 80 y.o. female with a hx of massive pulmonary embolism, complicated by cardiorespiratory arrest, while on estrogen therapy in 2017.  She had an episode of paroxysmal atrial fibrillation and underwent cardioversion in August 2019.  She also has minor coronary artery disease and suspicion for coronary vasospasm on previous angiography, hypertension and hyperlipidemia.  Previous Holter monitor showed evidence of sinus node dysfunction and chronotropic incompetence while taking beta-blockers.  Consequently, she is only receiving "as needed" diltiazem for episodes of palpitations consistent with atrial fibrillation, rather than a scheduled AV blocking agent.  She has normal left ventricular systolic function on echo and had normal pulmonary function test in 2018.   Her biggest current complaint is feeling "really tired".  She has not been troubled by palpitations and has not had to take the diltiazem at all.  The heart rate is typically in the 50s and 60s.  She has not had frank dyspnea or angina either at rest or with activity and has not experienced syncope.  Just lacks energy.  Denies problems with sleep or daytime somnolence.  Previous work-up showed evidence of chronotropic incompetence and persistent bradycardia.  When exercising, her heart rate tops out at about 100 bpm if she tries to push any harder she feels poorly.  When adjusted for her age, that is roughly 70% of maximum predicted heart rate.  Most recent LDL 44.  Past Medical History:  Diagnosis Date  . Anxiety   . Aortic atherosclerosis ([place])   . Arthritis   . Constipation   . Coronary artery disease 03/16/2016   a.  admx with angina and mildly elevated Troponin >> LHC: mLAD 30 >> CCB started for poss microvascular angina  . Depression   . Diverticulosis   . Edema of both lower extremities   . Fatty liver   . History of MI (myocardial infarction)   . Hyperlipidemia 03/16/2016  . Hypertension   . Hypothyroidism   . Joint pain   . Paroxysmal A-fib ([place])   . PE (pulmonary thromboembolism) ([place]) 03/17/2016   Bilateral submassive pulmonary embolus with RUE and RLE DVT on venous duplex 02/2016 c/b PEA arrest // Echo 03/21/16: mild LVH, EF 60-65, no RWMA, Gr 1 DD, normal RVSF  . Psoas tendinitis   . Sinus bradycardia 03/31/2016  . Thrombocytopenia ([place]) 02/2016   Profound thrombocytopenia noted upon admission for pulmonary embolism >> initially thought to be from Hep induced thrombocytopenia given recent admit for cardiac cath and Heparin use // However, HIT Ab was neg (0.184) // Hematology consult pending  . Thyroid disease   . Unilateral primary osteoarthritis, right knee   . Vascular malformation    mid ascending colon  . Vitamin D deficiency     Past Surgical History:  Procedure Laterality Date  . CARDIAC CATHETERIZATION N/A 03/15/2016   Procedure: Left Heart Cath and Coronary Angiography;  Surgeon: [name], MD;  Location: [facility];  Service: Cardiovascular;  Laterality: N/A;  . TOTAL KNEE ARTHROPLASTY Right 12/27/2016   Procedure: RIGHT TOTAL KNEE ARTHROPLASTY;  Surgeon: Gaynelle Arabian, MD;  Location: [facility];  Service: Orthopedics;  Laterality: Right;  Marland Kitchen VAGINAL HYSTERECTOMY  1983   endometriosis    Current Medications: Current Meds  Medication Sig  . apixaban (ELIQUIS) 5 MG TABS tablet Take 1 tablet (5 mg total) by mouth 2 (two) times daily.  . Cholecalciferol (VITAMIN D3) 2000 units TABS Take 1 tablet by mouth daily.  Marland Kitchen gabapentin (NEURONTIN) 300 MG capsule Take 1 capsule by mouth at bedtime.  . hydrochlorothiazide (HYDRODIURIL) 12.5 MG tablet TAKE 1 TABLET EACH DAY.  .  hydrochlorothiazide (MICROZIDE) 12.5 MG capsule Take 12.5 mg by mouth daily.  . Influenza vac split quadrivalent PF (FLUZONE HIGH-DOSE) 0.5 ML injection Fluzone High-Dose 2019-20 (PF) 180 mcg/0.5 mL intramuscular syringe  . levothyroxine (SYNTHROID) 75 MCG tablet Take 75 mcg by mouth daily.  Marland Kitchen lisinopril (ZESTRIL) 40 MG tablet TAKE 1 TABLET EACH DAY.  . rosuvastatin (CRESTOR) 20 MG tablet TAKE 1 TABLET ONCE DAILY.   Current Facility-Administered Medications for the 04/30/20 encounter (Office Visit) with Sanda Klein, MD  Medication  . 0.9 %  sodium chloride infusion  . metroNIDAZOLE (METROCREAM) 0.75 % cream     Allergies:   Patient has no known allergies.   Social History   Socioeconomic History  . Marital status: Widowed    Spouse name: Not on file  . Number of children: Not on file  . Years of education: Not on file  . Highest education level: Not on file  Occupational History  . Occupation: retired  Tobacco Use  . Smoking status: Never Smoker  . Smokeless tobacco: Never Used  Substance and Sexual Activity  . Alcohol use: No    Alcohol/week: 0.0 standard drinks  . Drug use: No  . Sexual activity: Never    Birth control/protection: Surgical    Comment: 1st intercourse 80 yo-Fewer than 5 partners  Other Topics Concern  . Not on file  Social History Narrative   [facility]:   Originally from [place]. Moved to [place] in 2012. 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   Social Determinants of Health   Financial Resource Strain: Not on file  Food Insecurity: Not on file  Transportation Needs: Not on file  Physical Activity: Not on file  Stress: Not on file  Social Connections: Not on file     Family History: The patient's family history includes Heart disease in her father and mother; Heart failure in her father, maternal grandmother, and mother; High blood pressure in her father; Myasthenia gravis in her  brother; Stroke in her brother and father. There is no history of Clotting disorder or Colon cancer.  ROS:   Please see the history of present illness.    All other systems are reviewed and are negative.  EKGs/Labs/Other Studies Reviewed:    The following studies were reviewed today:  EKG: Is ordered today and shows sinus rhythm, normal tracing Recent Labs: 05/10/2019: ALT 12; BUN 19; Creatinine, Ser 0.79; Hemoglobin 13.0; Platelets 176; Potassium 3.7; Sodium 136; TSH 0.861  Recent Lipid Panel    Component Value Date/Time   CHOL 113 02/26/2019 0858   TRIG 60 02/26/2019 0858   HDL 56 02/26/2019 0858   CHOLHDL 3.2 03/15/2016 0524   VLDL 15 03/15/2016 0524   LDLCALC 44 02/26/2019 0858    Physical Exam:    VS:  BP 114/62 (BP Location: Left Arm, Patient Position: Sitting, Cuff Size: Normal)   Pulse 66   Ht 5\' 2"  (1.575 m)   BMI 31.09 kg/m  Wt Readings from Last 3 Encounters:  06/04/19 170 lb (77.1 kg)  05/14/19 169 lb (76.7 kg)  05/10/19 173 lb 9.6 oz (78.7 kg)     General: Alert, oriented x3, no distress, mildly obese Head: no evidence of trauma, PERRL, EOMI, no exophtalmos or lid lag, no myxedema, no xanthelasma; normal ears, nose and oropharynx Neck: normal jugular venous pulsations and no hepatojugular reflux; brisk carotid pulses without delay and no carotid bruits Chest: clear to auscultation, no signs of consolidation by percussion or palpation, normal fremitus, symmetrical and full respiratory excursions Cardiovascular: normal position and quality of the apical impulse, regular rhythm, normal first and second heart sounds, no murmurs, rubs or gallops Abdomen: no tenderness or distention, no masses by palpation, no abnormal pulsatility or arterial bruits, normal bowel sounds, no hepatosplenomegaly Extremities: no clubbing, cyanosis or edema; 2+ radial, ulnar and brachial pulses bilaterally; 2+ right femoral, posterior tibial and dorsalis pedis pulses; 2+ left femoral,  posterior tibial and dorsalis pedis pulses; no subclavian or femoral bruits Neurological: grossly nonfocal Psych: Normal mood and affect    ASSESSMENT:    1. Paroxysmal atrial fibrillation (HCC)   2. SSS (sick sinus syndrome) (HCC)   3. Coronary artery disease involving native coronary artery of native heart with angina pectoris with documented spasm (HCC)   4. History of pulmonary embolism   5. Long term current use of anticoagulant   6. Essential hypertension   7. Hypercholesterolemia    PLAN:    In order of problems listed above:  1. PAFib: CHA2DS2-Vasc score 5 (age, CAD, HTN, female).  Has not recently been symptomatic.  We are avoiding AV nodal blocking agents and true antiarrhythmics due to her sinus node dysfunction and chronotropic incompetence, which was much more severe when taking beta-blockers.  The pacemaker may be necessary in the future. 2. SSS: She has sinus bradycardia and appears to have chronotropic incompetence.  She does meet criteria for pacemaker implantation for symptom improvement.  We discussed this in quite a bit of detail today, but have decided to postpone a decision regarding pacemaker therapy until her next appointment.  If we decide to go ahead with dual-chamber pacemaker implantation, she would benefit from a minute ventilation sensor Conservation officer, historic buildings device) or closed-loop sensor (Biotronik device). 3. CAD: Does not have angina pectoris.  Mild stenoses on previous cardiac catheterization 2017. Lipid profile is excellent on statin. Not taking aspirin due to full anticoagulation. Suspected of having coronary vasospasm, but currently asymptomatic. 4. Hx of PE: Plan lifelong anticoagulation for her atrial fibrillation anyway. 5. Anticoagulation: No bleeding complications, denies falls or injuries 6. HTN: Excellent control. 7. HLP: On statin with excellent lipid parameters.  Would still recommend attempts at weight loss.   Patient Instructions  Medication  Instructions:  No changes *If you need a refill on your cardiac medications before your next appointment, please call your pharmacy*   Lab Work: None ordered If you have labs (blood work) drawn today and your tests are completely normal, you will receive your results only by: Marland Kitchen MyChart Message (if you have MyChart) OR . A paper copy in the mail If you have any lab test that is abnormal or we need to change your treatment, we will call you to review the results.   Testing/Procedures: None ordered   Follow-Up: At [facility], you and your health needs are our priority.  As part of our continuing mission to provide you with exceptional heart care, we have created designated Provider Care Teams.  These Care Teams include your primary Cardiologist (physician) and Advanced Practice Providers (APPs -  Physician Assistants and Nurse Practitioners) who all work together to provide you with the care you need, when you need it.  We recommend signing up for the patient portal called "MyChart".  Sign up information is provided on this After Visit Summary.  MyChart is used to connect with patients for Virtual Visits (Telemedicine).  Patients are able to view lab/test results, encounter notes, upcoming appointments, etc.  Non-urgent messages can be sent to your provider as well.   To learn more about what you can do with MyChart, go to [email or web address].    Your next appointment:   6 month(s)  The format for your next appointment:   In Person  Provider:   Sanda Klein, MD      Medication Adjustments/Labs and Tests Ordered: Current medicines are reviewed at length with the patient today.  Concerns regarding medicines are outlined above.  Orders Placed This Encounter  Procedures  . EKG 12-Lead   No orders of the defined types were placed in this encounter.   Signed, Sanda Klein, MD  05/05/2020 3:22 PM    [facility]

## 2020-05-11 DIAGNOSIS — E890 Postprocedural hypothyroidism: Secondary | ICD-10-CM | POA: Diagnosis not present

## 2020-05-11 DIAGNOSIS — I25119 Atherosclerotic heart disease of native coronary artery with unspecified angina pectoris: Secondary | ICD-10-CM | POA: Diagnosis not present

## 2020-05-11 DIAGNOSIS — M1712 Unilateral primary osteoarthritis, left knee: Secondary | ICD-10-CM | POA: Diagnosis not present

## 2020-05-11 DIAGNOSIS — I10 Essential (primary) hypertension: Secondary | ICD-10-CM | POA: Diagnosis not present

## 2020-05-11 DIAGNOSIS — I480 Paroxysmal atrial fibrillation: Secondary | ICD-10-CM | POA: Diagnosis not present

## 2020-05-11 DIAGNOSIS — E7800 Pure hypercholesterolemia, unspecified: Secondary | ICD-10-CM | POA: Diagnosis not present

## 2020-05-15 ENCOUNTER — Other Ambulatory Visit: Payer: Self-pay | Admitting: Cardiovascular Disease

## 2020-07-04 ENCOUNTER — Other Ambulatory Visit: Payer: Self-pay | Admitting: Cardiovascular Disease

## 2020-07-06 ENCOUNTER — Other Ambulatory Visit: Payer: Self-pay | Admitting: Cardiovascular Disease

## 2020-07-17 ENCOUNTER — Other Ambulatory Visit (HOSPITAL_COMMUNITY)
Admission: RE | Admit: 2020-07-17 | Discharge: 2020-07-17 | Disposition: A | Discharge status: Home / Self Care | Payer: PPO | Source: Ambulatory Visit | Attending: Cardiovascular Disease | Admitting: Cardiovascular Disease

## 2020-07-17 ENCOUNTER — Other Ambulatory Visit: Payer: Self-pay

## 2020-07-17 ENCOUNTER — Other Ambulatory Visit: Payer: Self-pay | Admitting: *Deleted

## 2020-07-17 DIAGNOSIS — I495 Sick sinus syndrome: Secondary | ICD-10-CM

## 2020-07-17 DIAGNOSIS — Z20822 Contact with and (suspected) exposure to covid-19: Secondary | ICD-10-CM | POA: Insufficient documentation

## 2020-07-17 DIAGNOSIS — Z01812 Encounter for preprocedural laboratory examination: Secondary | ICD-10-CM | POA: Diagnosis not present

## 2020-07-17 LAB — BASIC METABOLIC PANEL
BUN/Creatinine Ratio: 17 (ref 12–28)
BUN: 15 mg/dL (ref 8–27)
CO2: 24 mmol/L (ref 20–29)
Calcium: 9 mg/dL (ref 8.7–10.3)
Chloride: 104 mmol/L (ref 96–106)
Creatinine, Ser: 0.86 mg/dL (ref 0.57–1.00)
Glucose: 90 mg/dL (ref 65–99)
Potassium: 4.1 mmol/L (ref 3.5–5.2)
Sodium: 141 mmol/L (ref 134–144)
eGFR: 69 mL/min/{1.73_m2} (ref >59)

## 2020-07-17 LAB — CBC
Hematocrit: 38.8 % (ref 34.0–46.6)
Hemoglobin: 13 g/dL (ref 11.1–15.9)
MCH: 31.1 pg (ref 26.6–33.0)
MCHC: 33.5 g/dL (ref 31.5–35.7)
MCV: 93 fL (ref 79–97)
Platelets: 181 10*3/uL (ref 150–450)
RBC: 4.18 x10E6/uL (ref 3.77–5.28)
RDW: 12.3 % (ref 11.7–15.4)
WBC: 5.4 10*3/uL (ref 3.4–10.8)

## 2020-07-17 LAB — SARS CORONAVIRUS 2 (TAT 6-24 HRS): SARS Coronavirus 2: NEGATIVE

## 2020-07-17 MED ORDER — APIXABAN 5 MG PO TABS: 5.0000 mg | ORAL_TABLET | Freq: Two times a day (BID) | ORAL | 0 refills | Status: DC

## 2020-07-17 MED ORDER — SODIUM CHLORIDE 0.9% FLUSH: 3.0000 mL | Freq: Two times a day (BID) | INTRAVENOUS | Status: DC

## 2020-07-21 ENCOUNTER — Ambulatory Visit (HOSPITAL_COMMUNITY)
Admission: RE | Admit: 2020-07-21 | Discharge: 2020-07-22 | Disposition: A | Discharge status: Home / Self Care | Payer: PPO | Attending: Cardiovascular Disease | Admitting: Cardiovascular Disease

## 2020-07-21 ENCOUNTER — Other Ambulatory Visit: Payer: Self-pay

## 2020-07-21 ENCOUNTER — Encounter (HOSPITAL_COMMUNITY)
Admission: RE | Disposition: A | Discharge status: Home / Self Care | Payer: Self-pay | Source: Home / Self Care | Attending: Cardiovascular Disease

## 2020-07-21 DIAGNOSIS — Z7901 Long term (current) use of anticoagulants: Secondary | ICD-10-CM | POA: Insufficient documentation

## 2020-07-21 DIAGNOSIS — I700 Atherosclerosis of aorta: Secondary | ICD-10-CM | POA: Diagnosis not present

## 2020-07-21 DIAGNOSIS — E039 Hypothyroidism, unspecified: Secondary | ICD-10-CM | POA: Diagnosis present

## 2020-07-21 DIAGNOSIS — I2510 Atherosclerotic heart disease of native coronary artery without angina pectoris: Secondary | ICD-10-CM | POA: Diagnosis not present

## 2020-07-21 DIAGNOSIS — Z79890 Hormone replacement therapy (postmenopausal): Secondary | ICD-10-CM | POA: Diagnosis not present

## 2020-07-21 DIAGNOSIS — Z86711 Personal history of pulmonary embolism: Secondary | ICD-10-CM | POA: Diagnosis not present

## 2020-07-21 DIAGNOSIS — I495 Sick sinus syndrome: Secondary | ICD-10-CM

## 2020-07-21 DIAGNOSIS — E7800 Pure hypercholesterolemia, unspecified: Secondary | ICD-10-CM | POA: Diagnosis not present

## 2020-07-21 DIAGNOSIS — I10 Essential (primary) hypertension: Secondary | ICD-10-CM | POA: Diagnosis present

## 2020-07-21 DIAGNOSIS — Z79899 Other long term (current) drug therapy: Secondary | ICD-10-CM | POA: Insufficient documentation

## 2020-07-21 DIAGNOSIS — I480 Paroxysmal atrial fibrillation: Secondary | ICD-10-CM | POA: Diagnosis not present

## 2020-07-21 DIAGNOSIS — Z950 Presence of cardiac pacemaker: Secondary | ICD-10-CM

## 2020-07-21 DIAGNOSIS — Z8249 Family history of ischemic heart disease and other diseases of the circulatory system: Secondary | ICD-10-CM | POA: Diagnosis not present

## 2020-07-21 DIAGNOSIS — E785 Hyperlipidemia, unspecified: Secondary | ICD-10-CM | POA: Diagnosis not present

## 2020-07-21 HISTORY — DX: Presence of cardiac pacemaker: Z95.0

## 2020-07-21 HISTORY — PX: PACEMAKER IMPLANT: EP1218

## 2020-07-21 SURGERY — PACEMAKER IMPLANT

## 2020-07-21 MED ORDER — LISINOPRIL 40 MG PO TABS
40.0000 mg | ORAL_TABLET | Freq: Every day | ORAL | Status: DC
  Administered 2020-07-22: 40 mg via ORAL
  Filled 2020-07-21: qty 1

## 2020-07-21 MED ORDER — HYDROCHLOROTHIAZIDE 25 MG PO TABS
12.5000 mg | ORAL_TABLET | Freq: Every day | ORAL | Status: DC
  Filled 2020-07-21 (×2): qty 1

## 2020-07-21 MED ORDER — SODIUM CHLORIDE 0.9 % IV SOLN: 250.0000 mL | INTRAVENOUS | Status: DC

## 2020-07-21 MED ORDER — FENTANYL CITRATE (PF) 100 MCG/2ML IJ SOLN
INTRAMUSCULAR | Status: AC
  Filled 2020-07-21: qty 2

## 2020-07-21 MED ORDER — SODIUM CHLORIDE 0.9 % IV SOLN: INTRAVENOUS | Status: DC

## 2020-07-21 MED ORDER — IOHEXOL 350 MG/ML SOLN
INTRAVENOUS | Status: DC | PRN
  Administered 2020-07-21: 15 mL

## 2020-07-21 MED ORDER — SODIUM CHLORIDE 0.9% FLUSH: 3.0000 mL | Freq: Two times a day (BID) | INTRAVENOUS | Status: DC

## 2020-07-21 MED ORDER — ACETAMINOPHEN 325 MG PO TABS
325.0000 mg | ORAL_TABLET | ORAL | Status: DC | PRN
  Administered 2020-07-22: 650 mg via ORAL
  Filled 2020-07-21: qty 2

## 2020-07-21 MED ORDER — SODIUM CHLORIDE 0.9% FLUSH
3.0000 mL | Freq: Two times a day (BID) | INTRAVENOUS | Status: DC
  Administered 2020-07-21: 3 mL via INTRAVENOUS

## 2020-07-21 MED ORDER — LEVOTHYROXINE SODIUM 75 MCG PO TABS
75.0000 ug | ORAL_TABLET | Freq: Every day | ORAL | Status: DC
  Administered 2020-07-22: 75 ug via ORAL
  Filled 2020-07-21: qty 1

## 2020-07-21 MED ORDER — GABAPENTIN 300 MG PO CAPS
300.0000 mg | ORAL_CAPSULE | Freq: Every day | ORAL | Status: DC
  Administered 2020-07-21: 300 mg via ORAL
  Filled 2020-07-21 (×2): qty 1

## 2020-07-21 MED ORDER — FENTANYL CITRATE (PF) 100 MCG/2ML IJ SOLN
INTRAMUSCULAR | Status: DC | PRN
  Administered 2020-07-21 (×4): 25 ug via INTRAVENOUS

## 2020-07-21 MED ORDER — ROSUVASTATIN CALCIUM 20 MG PO TABS
20.0000 mg | ORAL_TABLET | Freq: Every day | ORAL | Status: DC
  Filled 2020-07-21 (×2): qty 1

## 2020-07-21 MED ORDER — LIDOCAINE HCL (PF) 1 % IJ SOLN
INTRAMUSCULAR | Status: AC
  Filled 2020-07-21: qty 30

## 2020-07-21 MED ORDER — HYDROCODONE-ACETAMINOPHEN 5-325 MG PO TABS
1.0000 | ORAL_TABLET | ORAL | Status: DC | PRN
  Administered 2020-07-21 – 2020-07-22 (×3): 1 via ORAL
  Filled 2020-07-21 (×3): qty 1
  Filled 2020-07-21: qty 2

## 2020-07-21 MED ORDER — LIDOCAINE HCL (PF) 1 % IJ SOLN
INTRAMUSCULAR | Status: DC | PRN
  Administered 2020-07-21: 30 mL

## 2020-07-21 MED ORDER — ONDANSETRON HCL 4 MG/2ML IJ SOLN: 4.0000 mg | Freq: Four times a day (QID) | INTRAMUSCULAR | Status: DC | PRN

## 2020-07-21 MED ORDER — SODIUM CHLORIDE 0.9% FLUSH: 3.0000 mL | INTRAVENOUS | Status: DC | PRN

## 2020-07-21 MED ORDER — SODIUM CHLORIDE 0.9 % IV SOLN
80.0000 mg | INTRAVENOUS | Status: AC
  Administered 2020-07-21: 80 mg

## 2020-07-21 MED ORDER — MIDAZOLAM HCL 5 MG/5ML IJ SOLN
INTRAMUSCULAR | Status: DC | PRN
  Administered 2020-07-21 (×4): 1 mg via INTRAVENOUS

## 2020-07-21 MED ORDER — CEFAZOLIN SODIUM-DEXTROSE 2-4 GM/100ML-% IV SOLN
2.0000 g | INTRAVENOUS | Status: AC
  Administered 2020-07-21: 2 g via INTRAVENOUS

## 2020-07-21 MED ORDER — SODIUM CHLORIDE 0.9 % IV SOLN: 250.0000 mL | INTRAVENOUS | Status: DC | PRN

## 2020-07-21 MED ORDER — CEFAZOLIN SODIUM-DEXTROSE 1-4 GM/50ML-% IV SOLN
1.0000 g | Freq: Four times a day (QID) | INTRAVENOUS | Status: AC
  Administered 2020-07-21 – 2020-07-22 (×3): 1 g via INTRAVENOUS
  Filled 2020-07-21 (×3): qty 50

## 2020-07-21 MED ORDER — HEPARIN (PORCINE) IN NACL 1000-0.9 UT/500ML-% IV SOLN
INTRAVENOUS | Status: AC
  Filled 2020-07-21: qty 500

## 2020-07-21 MED ORDER — HEPARIN (PORCINE) IN NACL 1000-0.9 UT/500ML-% IV SOLN
INTRAVENOUS | Status: DC | PRN
  Administered 2020-07-21: 500 mL

## 2020-07-21 MED ORDER — MIDAZOLAM HCL 5 MG/5ML IJ SOLN
INTRAMUSCULAR | Status: AC
  Filled 2020-07-21: qty 5

## 2020-07-21 MED ORDER — CHLORHEXIDINE GLUCONATE 4 % EX LIQD: 4.0000 "application " | Freq: Once | CUTANEOUS | Status: DC

## 2020-07-21 MED ORDER — CEFAZOLIN SODIUM-DEXTROSE 2-4 GM/100ML-% IV SOLN
INTRAVENOUS | Status: AC
  Filled 2020-07-21: qty 100

## 2020-07-21 MED ORDER — HYDROCODONE-ACETAMINOPHEN 5-325 MG PO TABS
ORAL_TABLET | ORAL | Status: AC
  Filled 2020-07-21: qty 1

## 2020-07-21 MED ORDER — SODIUM CHLORIDE 0.9 % IV SOLN
INTRAVENOUS | Status: AC
  Filled 2020-07-21: qty 2

## 2020-07-21 SURGICAL SUPPLY — 7 items
CABLE SURGICAL S-101-97-12 (CABLE) ×2 IMPLANT
LEAD INGEVITY 7840 45 (Lead) ×2 IMPLANT
LEAD INGEVITY 7841 52 (Lead) ×4 IMPLANT
PACEMAKER ACCOLADE DR-EL (Pacemaker) ×2 IMPLANT
PAD PRO RADIOLUCENT 2001M-C (PAD) ×2 IMPLANT
SHEATH 7FR PRELUDE SNAP 13 (SHEATH) ×6 IMPLANT
TRAY PACEMAKER INSERTION (PACKS) ×2 IMPLANT

## 2020-07-21 NOTE — Op Note (Signed)
Procedure report  Procedure performed:  1. Implantation of new dual chamber permanent pacemaker  2. Fluoroscopy and left upper extremity venography  3. Light sedation    Reason for procedure:  Symptomatic bradycardia due to: Sinus node dysfunction Tachycardia-bradycardia syndrome (paroxysmal atrial fibrillation) Bradycardia due to necessary medications  Procedure performed by: Sanda Klein, MD  Complications: None  Estimated blood loss: 100 mL  Medications administered during procedure: Ancef 1 g intravenously Vancomycin 1 g intravenously Lidocaine 1% 30 mL locally,  Fentanyl 100 mcg intravenously Versed 4 mg intravenously  Device details: Psychologist, counselling MRI EL DR  Model [id number], serial number [id number] Right atrial lead [employer] model [phone] serial number [id number] Right ventricular lead [employer] model 7840-52 serial number [id number]  Procedure details:  After the risks and benefits of the procedure were discussed the patient provided informed consent and was brought to the cardiac cath lab in the fasting state. The patient was prepped and draped in usual sterile fashion. Local anesthesia with 1% lidocaine was administered to to the infraclavicular area. A 5-6 cm horizontal incision was made parallel with and 2-3 cm caudal to the left clavicle. Using electrocautery and blunt dissection a prepectoral pocket was created down to the level of the pectoralis major muscle fascia. The pocket was carefully inspected for hemostasis.   Under fluoroscopic guidance and using the modified Seldinger technique 2 separate venipunctures were performed to access the left subclavian vein. Some difficulty was encountered accessing the vein, so a venogram was performed.  Two J-tip guidewires were subsequently exchanged for two 7 French safe sheaths.  Under fluoroscopic guidance the ventricular lead was advanced to level of the mid to apical  right ventricular septum and thet active-fixation helix was deployed. Prominent current of injury was seen. Satisfactory pacing and sensing parameters were recorded. There was no evidence of diaphragmatic stimulation at maximum device output.  In similar fashion the right atrial lead was advanced to the level of the atrial appendage. The active-fixation helix was deployed. There was prominent current of injury. Satisfactory  pacing and sensing parameters were recorded. There was no evidence of diaphragmatic stimulation with pacing at maximum device output. The safe sheath was peeled away and the lead was secured in place with 2-0 silk.   The ventricular safe sheath was peeled away and there was a surprising amount of bleeding around the lead, that persisted despite suturing. Hemostats were used to control the bleeding and the insulation of the ventricular lead was accidentally breached. The lead fixation helix was retracted and the damaged lead was explanted. Hemostasis was easily achieved once the lead was removed.   Another venipuncture was performed, caudal to the previous one, and a safe sheath and ventricular lead were introduced. The lead was advanced and deployed in a similar position at the apical septum. There was prominent current of injury. Satisfactory  pacing and sensing parameters were recorded. There was no evidence of diaphragmatic stimulation with pacing at maximum device output. The safe sheath was peeled away and the lead was secured in place with 2-0 silk.  The pocket was flushed with copious amounts of antibiotic solution. Reinspection showed excellent hemostasis..  The ventricular lead was connected to the generator and appropriate ventricular pacing was seen. Subsequently the atrial lead was also connected. Repeat testing of the lead parameters via telemetry showed excellent values.  The entire system was then carefully inserted in the pocket with care been taking that the leads  and device assumed a  comfortable position without pressure on the incision. Great care was taken that the leads be located deep to the generator. The pocket was then closed in layers using 2 layers of 2-0 Vicryl, one layer of 3-0 Vicryl and cutaneous steristrips, after which a sterile dressing was applied.  During the procedure, while attempting to achieve pocket hemostasis, the patient described numbness in the left hand. This improved as pressure was relieved from the area, and improved further after the procedure was completed. She had normal radial and ulnar pulses, normal finger and hand motion and warm fingertips. Nevertheless, the decision was made to monitor overnight for resolution of neuropathic symptoms and to monitor for bleeding.  At the end of the procedure the following lead parameters were encountered:  Right atrial lead  sensed P waves 5.5, impedance 652 ohms, threshold 0.5 V at 0.4 ms pulse width.  Right ventricular lead sensed R waves 14.6 mV, impedance 775 ohms, threshold 0.4 V at 0.4 ms pulse width.  Sanda Klein, MD, [facility] [phone] office [phone] pager

## 2020-07-21 NOTE — H&P (Signed)
Cardiology Admission History and Physical:   Patient ID: Michele Thompson MRN: [id number]; DOB: Oct 07, 1940   Admission date: 07/21/2020  PCP:  Kathyrn Lass, MD   [place]  Cardiologist:  Sanda Klein, MD  Advanced Practice Provider:  No care team member to display Electrophysiologist:  None        Chief Complaint:  Pacemaker for tachy-brady syndrome  Patient Profile:   Michele Thompson is a 80 y.o. female with paroxysmal atrial fibrillation, symptomatic sinus bradycardia with chronotropic incompetence, history of pulmonary embolism (while on estrogen supplements), aortic atherosclerosis, minor CAD on angiography, HTN and hypercholesterolemia here for pacemaker implantation   History of Present Illness:   [name] has had fatigue and Holter monitor evidence of bradycardia and chronotropic incompetence even after stopping all meds with negative chronotropic effect (necessary for AFib rate control). Has noticed that during exercise her heart rate tops out in the 90s and she becomes ill if "she pushes any harder".  The patient specifically denies any chest pain at rest or with exertion, dyspnea at rest, orthopnea, paroxysmal nocturnal dyspnea, syncope,  focal neurological deficits, intermittent claudication, lower extremity edema, unexplained weight gain, cough, hemoptysis or wheezing. She has infrequent palpitations. She has not had falls or bleeding.   Past Medical History:  Diagnosis Date  . Anxiety   . Aortic atherosclerosis ([place])   . Arthritis   . Constipation   . Coronary artery disease 03/16/2016   a. admx with angina and mildly elevated Troponin >> LHC: mLAD 30 >> CCB started for poss microvascular angina  . Depression   . Diverticulosis   . Edema of both lower extremities   . Fatty liver   . History of MI (myocardial infarction)   . Hyperlipidemia 03/16/2016  . Hypertension   . Hypothyroidism   . Joint pain   . Paroxysmal A-fib ([place])   . PE  (pulmonary thromboembolism) ([place]) 03/17/2016   Bilateral submassive pulmonary embolus with RUE and RLE DVT on venous duplex 02/2016 c/b PEA arrest // Echo 03/21/16: mild LVH, EF 60-65, no RWMA, Gr 1 DD, normal RVSF  . Psoas tendinitis   . Sinus bradycardia 03/31/2016  . Thrombocytopenia ([place]) 02/2016   Profound thrombocytopenia noted upon admission for pulmonary embolism >> initially thought to be from Hep induced thrombocytopenia given recent admit for cardiac cath and Heparin use // However, HIT Ab was neg (0.184) // Hematology consult pending  . Thyroid disease   . Unilateral primary osteoarthritis, right knee   . Vascular malformation    mid ascending colon  . Vitamin D deficiency     Past Surgical History:  Procedure Laterality Date  . CARDIAC CATHETERIZATION N/A 03/15/2016   Procedure: Left Heart Cath and Coronary Angiography;  Surgeon: [name], MD;  Location: [facility];  Service: Cardiovascular;  Laterality: N/A;  . TOTAL KNEE ARTHROPLASTY Right 12/27/2016   Procedure: RIGHT TOTAL KNEE ARTHROPLASTY;  Surgeon: Gaynelle Arabian, MD;  Location: [facility];  Service: Orthopedics;  Laterality: Right;  Marland Kitchen VAGINAL HYSTERECTOMY  1983   endometriosis     Medications Prior to Admission: Prior to Admission medications   Medication Sig Start Date End Date Taking? Authorizing Provider  Cholecalciferol (VITAMIN D3) 2000 units TABS Take 2,000 Units by mouth daily.   Yes [provider]  gabapentin (NEURONTIN) 300 MG capsule Take 300 mg by mouth at bedtime. 06/20/17  Yes [provider]  hydrochlorothiazide (HYDRODIURIL) 12.5 MG tablet TAKE 1 TABLET EACH DAY. Patient taking  differently: Take 12.5 mg by mouth daily. 07/04/20  Yes , , MD  levothyroxine (SYNTHROID) 75 MCG tablet Take 75 mcg by mouth daily before breakfast. 03/01/19  Yes [provider]  lisinopril (ZESTRIL) 40 MG tablet TAKE 1 TABLET EACH DAY. Patient taking differently: Take 40 mg by  mouth daily. 07/04/20  Yes , , MD  rosuvastatin (CRESTOR) 20 MG tablet TAKE 1 TABLET ONCE DAILY. Patient taking differently: Take 20 mg by mouth daily. 07/04/20  Yes , , MD  apixaban (ELIQUIS) 5 MG TABS tablet Take 1 tablet (5 mg total) by mouth 2 (two) times daily. 07/17/20   , , MD  hydrochlorothiazide (MICROZIDE) 12.5 MG capsule Take 12.5 mg by mouth daily. Patient not taking: Reported on 07/11/2020    [provider]     Allergies:   No Known Allergies  Social History:   Social History   Socioeconomic History  . Marital status: Widowed    Spouse name: Not on file  . Number of children: Not on file  . Years of education: Not on file  . Highest education level: Not on file  Occupational History  . Occupation: retired  Tobacco Use  . Smoking status: Never Smoker  . Smokeless tobacco: Never Used  Substance and Sexual Activity  . Alcohol use: No    Alcohol/week: 0.0 standard drinks  . Drug use: No  . Sexual activity: Never    Birth control/protection: Surgical    Comment: 1st intercourse 80 yo-Fewer than 5 partners  Other Topics Concern  . Not on file  Social History Narrative   Michele Thompson Pulmonary:   Originally from [place]. Moved to [place] in 2012. 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   Social Determinants of Health   Financial Resource Strain: Not on file  Food Insecurity: Not on file  Transportation Needs: Not on file  Physical Activity: Not on file  Stress: Not on file  Social Connections: Not on file  Intimate Partner Violence: Not on file    Family History:   The patient's family history includes Heart disease in her father and mother; Heart failure in her father, maternal grandmother, and mother; High blood pressure in her father; Myasthenia gravis in her brother; Stroke in her brother and father. There is no history of Clotting disorder or Colon cancer.     ROS:  Please see the history of present illness.  All other ROS reviewed and negative.     Physical Exam/Data:   Vitals:   07/21/20 1146  BP: (!) 189/69  Pulse: (!) 53  Temp: 97.6 F (36.4 C)  TempSrc: Oral  SpO2: 100%  Weight: 81.6 kg  Height: 5\' 2"  (1.575 m)   No intake or output data in the 24 hours ending 07/21/20 1241 Last 3 Weights 07/21/2020 04/30/2020 06/04/2019  Weight (lbs) 180 lb (No Data) 170 lb  Weight (kg) 81.647 kg (No Data) 77.111 kg     Body mass index is 32.92 kg/m.  General:  Well nourished, well developed, in no acute distress HEENT: normal Lymph: no adenopathy Neck: no JVD Endocrine:  No thryomegaly Vascular: No carotid bruits; FA pulses 2+ bilaterally without bruits  Cardiac:  normal S1, S2; bradycardia, RRR; no murmur  Lungs:  clear to auscultation bilaterally, no wheezing, rhonchi or rales  Abd: soft, nontender, no hepatomegaly  Ext: no edema Musculoskeletal:  No deformities, BUE and BLE strength normal and equal Skin: warm and dry  Neuro:  CNs  2-12 intact, no focal abnormalities noted Psych:  Normal affect    EKG:  The ECG that was done  was personally reviewed and demonstrates sinus bradycardia 47 bpm, otw normal.  Relevant CV Studies:  ECHO 05/21/2017   - Left ventricle: The cavity size was normal. Systolic function was  normal. The estimated ejection fraction was in the range of 60%  to 65%. Wall motion was normal; there were no regional wall  motion abnormalities. Left ventricular diastolic function  parameters were normal.  - Left atrium: The atrium was mildly dilated.    CATH 03/15/2016  1. Mild non-obstructive CAD, mid-LAD irregularity 2. Widely patent left main, left circumflex, and RCA 3. Normal LV function   No clear etiology to explain exertional or resting angina. The patient may have  microvascular angina. A trial of Ca++ Channel blockade may be reasonable.    Laboratory Data:  High Sensitivity  Troponin:  No results for input(s): TROPONINIHS in the last 720 hours.    Chemistry Recent Labs  Lab 07/17/20 1131  NA 141  K 4.1  CL 104  CO2 24  GLUCOSE 90  BUN 15  CREATININE 0.86  CALCIUM 9.0    No results for input(s): PROT, ALBUMIN, AST, ALT, ALKPHOS, BILITOT in the last 168 hours. Hematology Recent Labs  Lab 07/17/20 1147  WBC 5.4  RBC 4.18  HGB 13.0  HCT 38.8  MCV 93  MCH 31.1  MCHC 33.5  RDW 12.3  PLT 181   BNPNo results for input(s): BNP, PROBNP in the last 168 hours.  DDimer No results for input(s): DDIMER in the last 168 hours.   Radiology/Studies:  No results found.   Assessment and Plan:    1. PAFib: CHA2DS2-Vasc score 5 (age 1, CAD, HTN, female). On anticoagulation. Avoiding true antiarrhythmics and AV nodal blocking agents due to underlying conduction system disease. 2. SSS/Tachy-Brady sd: She has sinus bradycardia and appears to have chronotropic incompetence. This is noticeable if he tries to push her exercise level. 3. CAD: Mild stenoses on previous cardiac catheterization 2017. Lipid profile is excellent on statin. Not taking aspirin due to full anticoagulation. Suspected of having coronary vasospasm, but currently asymptomatic. 4. Hx of PE: Will be on anticoagulation for her atrial fibrillation anyway. 5. Anticoagulation: No bleeding complications 6. HTN: usually well-controlled. Elevated today (anxiety?). 7. HLP: Tolerating statin without side effects; excellent lipid profile parameters.    This dual chamber pacemaker implantation procedure has been fully reviewed with the patient and written informed consent has been obtained.     For questions or updates, please contact [place] Please consult [email or web address] for contact info under     Signed, Sanda Klein, MD  07/21/2020 12:41 PM

## 2020-07-22 ENCOUNTER — Ambulatory Visit (HOSPITAL_COMMUNITY): Payer: PPO

## 2020-07-22 ENCOUNTER — Encounter (HOSPITAL_COMMUNITY): Payer: Self-pay | Admitting: Cardiovascular Disease

## 2020-07-22 DIAGNOSIS — Z950 Presence of cardiac pacemaker: Secondary | ICD-10-CM | POA: Diagnosis not present

## 2020-07-22 DIAGNOSIS — E7800 Pure hypercholesterolemia, unspecified: Secondary | ICD-10-CM | POA: Diagnosis not present

## 2020-07-22 DIAGNOSIS — M47814 Spondylosis without myelopathy or radiculopathy, thoracic region: Secondary | ICD-10-CM | POA: Diagnosis not present

## 2020-07-22 DIAGNOSIS — I2510 Atherosclerotic heart disease of native coronary artery without angina pectoris: Secondary | ICD-10-CM | POA: Diagnosis not present

## 2020-07-22 DIAGNOSIS — Z79890 Hormone replacement therapy (postmenopausal): Secondary | ICD-10-CM | POA: Diagnosis not present

## 2020-07-22 DIAGNOSIS — Z79899 Other long term (current) drug therapy: Secondary | ICD-10-CM | POA: Diagnosis not present

## 2020-07-22 DIAGNOSIS — I480 Paroxysmal atrial fibrillation: Secondary | ICD-10-CM | POA: Diagnosis not present

## 2020-07-22 DIAGNOSIS — Z8249 Family history of ischemic heart disease and other diseases of the circulatory system: Secondary | ICD-10-CM | POA: Diagnosis not present

## 2020-07-22 DIAGNOSIS — Z7901 Long term (current) use of anticoagulants: Secondary | ICD-10-CM | POA: Diagnosis not present

## 2020-07-22 DIAGNOSIS — E039 Hypothyroidism, unspecified: Secondary | ICD-10-CM | POA: Diagnosis not present

## 2020-07-22 DIAGNOSIS — I495 Sick sinus syndrome: Secondary | ICD-10-CM | POA: Diagnosis not present

## 2020-07-22 DIAGNOSIS — I10 Essential (primary) hypertension: Secondary | ICD-10-CM | POA: Diagnosis not present

## 2020-07-22 DIAGNOSIS — E785 Hyperlipidemia, unspecified: Secondary | ICD-10-CM | POA: Diagnosis not present

## 2020-07-22 DIAGNOSIS — I700 Atherosclerosis of aorta: Secondary | ICD-10-CM | POA: Diagnosis not present

## 2020-07-22 NOTE — Progress Notes (Signed)
Progress Note  Patient Name: ZAKYRA KUKUK Date of Encounter: 07/22/2020  [facility] Cardiologist: Sanda Klein, MD   Subjective   Sore in L shoulder area, but slept well. Dressing clean, no hematoma. No pain or numbness in left hand and fingers. Normal motion and normal radial pulse. CXR, ECG and device check all show favorable results.  Inpatient Medications    Scheduled Meds: . gabapentin  300 mg Oral QHS  . hydrochlorothiazide  12.5 mg Oral Daily  . levothyroxine  75 mcg Oral QAC breakfast  . lisinopril  40 mg Oral Daily  . rosuvastatin  20 mg Oral Daily  . sodium chloride flush  3 mL Intravenous Q12H   Continuous Infusions: . sodium chloride    .  ceFAZolin (ANCEF) IV 1 g (07/22/20 0252)   PRN Meds: sodium chloride, acetaminophen, HYDROcodone-acetaminophen, ondansetron (ZOFRAN) IV, sodium chloride flush   Vital Signs    Vitals:   07/21/20 1826 07/21/20 1940 07/22/20 0104 07/22/20 0401  BP: (!) 165/69 (!) 143/50 (!) 128/59 123/65  Pulse: (!) 59 60 60 60  Resp: 16 16 18 16   Temp: 97.6 F (36.4 C) 97.7 F (36.5 C) 97.9 F (36.6 C) 98 F (36.7 C)  TempSrc: Oral Oral Oral Oral  SpO2: 100% 98% 97% 96%  Weight: 83.5 kg   83.7 kg  Height: 5\' 2"  (1.575 m)       Intake/Output Summary (Last 24 hours) at 07/22/2020 1017 Last data filed at 07/22/2020 0405 Gross per 24 hour  Intake --  Output 500 ml  Net -500 ml   Last 3 Weights 07/22/2020 07/21/2020 07/21/2020  Weight (lbs) 184 lb 8 oz 184 lb 1.6 oz 180 lb  Weight (kg) 83.689 kg 83.507 kg 81.647 kg      Telemetry    A paced, V sensed rhythm - Personally Reviewed  ECG    A paced V sensed - Personally Reviewed  Physical Exam  Comfortable. No hematoma, no blood on surgical dressing. GEN: No acute distress.   Neck: No JVD Cardiac: RRR, no murmurs, rubs, or gallops.  Respiratory: Clear to auscultation bilaterally. GI: Soft, nontender, non-distended  MS: No edema; No deformity. Neuro:  Nonfocal  Psych:  Normal affect   Labs    High Sensitivity Troponin:  No results for input(s): TROPONINIHS in the last 720 hours.    Chemistry Recent Labs  Lab 07/17/20 1131  NA 141  K 4.1  CL 104  CO2 24  GLUCOSE 90  BUN 15  CREATININE 0.86  CALCIUM 9.0     Hematology Recent Labs  Lab 07/17/20 1147  WBC 5.4  RBC 4.18  HGB 13.0  HCT 38.8  MCV 93  MCH 31.1  MCHC 33.5  RDW 12.3  PLT 181    BNPNo results for input(s): BNP, PROBNP in the last 168 hours.   DDimer No results for input(s): DDIMER in the last 168 hours.   Radiology    DG Chest 2 View  Result Date: 07/22/2020 CLINICAL DATA:  Pacemaker. EXAM: CHEST - 2 VIEW COMPARISON:  07/25/2017. FINDINGS: Cardiac pacer with lead tips over the right atrium right ventricle. Heart size stable. No pulmonary venous congestion. No focal infiltrate. No pleural effusion or pneumothorax. Degenerative change thoracic spine. IMPRESSION: 1. Cardiac pacer with lead tips over the right atrium right ventricle. No pneumothorax. 2.  No acute cardiopulmonary disease. Electronically Signed   By: Marcello Moores  Register   On: 07/22/2020 06:52   EP PPM/ICD IMPLANT  Result Date:  07/21/2020 1. Implantation of new dual chamber permanent pacemaker 2. Fluoroscopy and left upper extremity venography 3. Light sedation Reason for procedure: Symptomatic bradycardia due to: Sinus node dysfunction Tachycardia-bradycardia syndrome (paroxysmal atrial fibrillation) Bradycardia due to necessary medications Procedure performed by: Sanda Klein, MD Complications: None Estimated blood loss: <10 mL Medications administered during procedure: Ancef 1 g intravenously Vancomycin 1 g intravenously Lidocaine 1% 30 mL locally, Fentanyl 100 mcg intravenously Versed 4 mg intravenously Device details: Psychologist, counselling MRI EL DR  Model [id number], serial number [id number] Right atrial lead [employer] model [phone] serial number [id number] Right ventricular lead [employer] model 7840-52 serial number [id number] Procedure details: After the risks and benefits of the procedure were discussed the patient provided informed consent and was brought to the cardiac cath lab in the fasting state. The patient was prepped and draped in usual sterile fashion. Local anesthesia with 1% lidocaine was administered to to the infraclavicular area. A 5-6 cm horizontal incision was made parallel with and 2-3 cm caudal to the left clavicle. Using electrocautery and blunt dissection a prepectoral pocket was created down to the level of the pectoralis major muscle fascia. The pocket was carefully inspected for hemostasis. Under fluoroscopic guidance and using the modified Seldinger technique 2 separate venipunctures were performed to access the left subclavian vein. Some difficulty was encountered accessing the vein, so a venogram was performed.  Two J-tip guidewires were subsequently exchanged for two 7 French safe sheaths. Under fluoroscopic guidance the ventricular lead was advanced to level of the mid to apical right ventricular septum and thet active-fixation helix was deployed. Prominent current of injury was seen. Satisfactory pacing and sensing parameters were recorded. There was no evidence of diaphragmatic stimulation at maximum device output. In similar fashion the right atrial lead was advanced to the level of the atrial appendage. The active-fixation helix was deployed. There was prominent current of injury. Satisfactory  pacing and sensing parameters were recorded. There was no evidence of diaphragmatic stimulation with pacing at maximum device output. The safe sheath was peeled away and the lead was secured in place with 2-0 silk.  The ventricular safe sheath was peeled away and there was a surprising amount of bleeding around the lead, that persisted despite suturing. Hemostats were used to control the bleeding and the insulation of the ventricular lead was accidentally breached. The  lead fixation helix was retracted and the damaged lead was explanted. Hemostasis was easily achieved once the lead was removed. Another venipuncture was performed, caudal to the previous one, and a safe sheath and ventricular lead were introduced. The lead was advanced and deployed in a similar position at the apical septum. There was prominent current of injury. Satisfactory  pacing and sensing parameters were recorded. There was no evidence of diaphragmatic stimulation with pacing at maximum device output. The safe sheath was peeled away and the lead was secured in place with 2-0 silk. The pocket was flushed with copious amounts of antibiotic solution. Reinspection showed excellent hemostasis.. The ventricular lead was connected to the generator and appropriate ventricular pacing was seen. Subsequently the atrial lead was also connected. Repeat testing of the lead parameters via telemetry showed excellent values. The entire system was then carefully inserted in the pocket with care been taking that the leads and device assumed a comfortable position without pressure on the incision. Great care was taken that the leads be located deep to the generator. The pocket was then closed in layers using 2  layers of 2-0 Vicryl, one layer of 3-0 Vicryl and cutaneous steristrips, after which a sterile dressing was applied. During the procedure, while attempting to achieve pocket hemostasis, the patient described numbness in the left hand. This improved as pressure was relieved from the area, and improved further after the procedure was completed. She had normal radial and ulnar pulses, normal finger and hand motion and warm fingertips. Nevertheless, the decision was made to monitor overnight for resolution of neuropathic symptoms and to monitor for bleeding. At the end of the procedure the following lead parameters were encountered: Right atrial lead sensed P waves 5.5, impedance 652 ohms, threshold 0.5 V at 0.4 ms pulse  width. Right ventricular lead sensed R waves 14.6 mV, impedance 775 ohms, threshold 0.4 V at 0.4 ms pulse width.   Cardiac Studies   This AM device check shows excellent sensing and pacing thresholds on both leads (see device check scan).  Patient Profile     80 y.o. female with symptomatic bradycardia and paroxysmal atrial fibrillation, s/p elective dual chamber pacemaker implantation.  Assessment & Plan    Expect a longer period with shoulder discomfort due to the need to replace the ventricular lead, control bleeding. Discussed wound care and activity restrictions.  Delay restarting Eliquis for 3 days (resume on Friday 8th). Short supply of hydrocodone/APAP 5/325, 1 tab every 4 hours as needed for pain, #10 no refills. Prefer use of acetaminophen. Wound check 07/31/2020 device clinic. Office visit July to reprogram outputs and sensor settings.   For questions or updates, please contact [place] Please consult [email or web address] for contact info under        Signed, Sanda Klein, MD  07/22/2020, 8:22 AM

## 2020-07-22 NOTE — Discharge Instructions (Signed)
RESTART apixaban 07/25/20 as previously prescribed.   Supplemental Discharge Instructions for  Pacemaker/Defibrillator Patients  Activity Do not raise your left/right arm above shoulder level or extend it backward beyond shoulder level for 2 weeks. Wear the arm sling as a reminder or as needed for comfort for 2 weeks. No heavy lifting or vigorous activity with your left/right arm for 6-8 weeks.    NO DRIVING is preferable for 2 weeks; If absolutely necessary, drive only short, familiar routes. DO wear your seatbelt, even if it crosses over the pacemaker site.  WOUND CARE - Keep the wound area clean and dry.  Remove the dressing the day after you return home (usually 48 hours after the procedure). - DO NOT SUBMERGE UNDER WATER UNTIL FULLY HEALED (no tub baths, hot tubs, swimming pools, etc.).  - You  may shower or take a sponge bath after the dressing is removed. DO NOT SOAK the area and do not allow the shower to directly spray on the site. - If you have staples, these will be removed in the office in 7-14 days. - If you have tape/steri-strips on your wound, these will fall off; do not pull them off prematurely.   - No bandage is needed on the site.  DO  NOT apply any creams, oils, or ointments to the wound area. - If you notice any drainage or discharge from the wound, any swelling, excessive redness or bruising at the site, or if you develop a fever > 101? F after you are discharged home, call the office at once.  Special Instructions - You are still able to use cellular telephones.  Avoid carrying your cellular phone near your device. - When traveling through airports, show security personnel your identification card to avoid being screened in the metal detectors.  - Avoid arc welding equipment, MRI testing (magnetic resonance imaging), TENS units (transcutaneous nerve stimulators).  Call the office for questions about other devices. - Avoid electrical appliances that are in poor condition or  are not properly grounded. - Microwave ovens are safe to be near or to operate.

## 2020-07-22 NOTE — Progress Notes (Signed)
Patient provided with discharge packet and post pacemaker insertion care discussed. IV removed and telemetry discontinued. Family in room to provide transportation home for the patient

## 2020-07-22 NOTE — Discharge Summary (Signed)
Discharge Summary    Patient ID: Michele Thompson MRN: [id number]; DOB: 03-May-1940  Admit date: 07/21/2020 Discharge date: 07/22/2020  PCP:  Kathyrn Lass, MD   [place]  Cardiologist:  Sanda Klein, MD  Advanced Practice Provider:  No care team member to display Electrophysiologist:  None   Discharge Diagnoses    Principal Problem:   Tachycardia-bradycardia syndrome [facility]) Active Problems:   Essential hypertension   Hypothyroidism   Paroxysmal atrial fibrillation [facility])    Diagnostic Studies/Procedures    PPM Implantation 07/21/20: Conclusion   1. Implantation of new dual chamber permanent pacemaker  2. Fluoroscopy and left upper extremity venography  3. Light sedation  Device details: Psychologist, counselling MRI EL DR  Model [id number], serial number [id number] Right atrial lead [employer] model [phone] serial number [id number] Right ventricular lead [employer] model 7840-52 serial number [id number]  _____________   History of Present Illness     Michele Thompson is a 80 y.o. female with paroxysmal atrial fibrillation, symptomatic bradycardia with chronotropic incompetence, history of pulmonary embolism (while on estrogen supplements), aortic atherosclerosis, minor CAD on angiography, HTN and hypercholesterolemia. She was seen outpatient 04/30/20 and PPM implantation was discussed in detail at that time, however she preferred to delay decision. It was later decided to pursue PPM placement, for which she presented 07/21/20.   Ms. Towers has had fatigue and Holter monitor evidence of bradycardia and chronotropic incompetence even after stopping all meds with negative chronotropic effect (necessary for AFib rate control). Has noticed that during exercise her heart rate tops out in the 90s and she becomes ill if "she pushes any harder".  The patient specifically denied any chest pain at rest or with exertion, dyspnea at rest,  orthopnea, paroxysmal nocturnal dyspnea, syncope,  focal neurological deficits, intermittent claudication, lower extremity edema, unexplained weight gain, cough, hemoptysis or wheezing. She reported infrequent palpitations. She has not had falls or bleeding.   Hospital Course     Consultants: None   1. Tachy-brady syndrome: patient with known bradycardia and chronotropic incompetence. Decision made to pursue PPM placement which occurred 07/21/20. Patient had [facility] device placed without complication. CXR 4/5/2 was without PTX and PPM leads over RA/RV. She was seen by Dr. Sallyanne Kuster and cleared for discharge.  - Plan for wound check 07/31/20 - To see Dr. Sallyanne Kuster in 3 months.   2. Paroxysmal atrial fibrillation: Eliquis held in anticipation of PPM placement. Recommended to resume eliquis 07/25/20 as previously prescribed.  - Continue eliquis for stroke ppx  3. HTN: BP stable this admission - Continue home lisinopril and HCTZ  4. HLD: - Continue home crestor  5. Hypothyroidism: - Continue home levothyroxine   Did the patient have an acute coronary syndrome (MI, NSTEMI, STEMI, etc) this admission?:  No                               Did the patient have a percutaneous coronary intervention (stent / angioplasty)?:  No.       _____________  Discharge Vitals Blood pressure (!) 137/59, pulse 60, temperature 98.1 F (36.7 C), temperature source Oral, resp. rate 18, height 5\' 2"  (1.575 m), weight 83.7 kg, SpO2 98 %.  Filed Weights   07/21/20 1146 07/21/20 1826 07/22/20 0401  Weight: 81.6 kg 83.5 kg 83.7 kg    Labs & Radiologic Studies    CBC No results for input(s): WBC, NEUTROABS,  HGB, HCT, MCV, PLT in the last 72 hours. Basic Metabolic Panel No results for input(s): NA, K, CL, CO2, GLUCOSE, BUN, CREATININE, CALCIUM, MG, PHOS in the last 72 hours. Liver Function Tests No results for input(s): AST, ALT, ALKPHOS, BILITOT, PROT, ALBUMIN in the last 72 hours. No results for  input(s): LIPASE, AMYLASE in the last 72 hours. High Sensitivity Troponin:   No results for input(s): TROPONINIHS in the last 720 hours.  BNP Invalid input(s): POCBNP D-Dimer No results for input(s): DDIMER in the last 72 hours. Hemoglobin A1C No results for input(s): HGBA1C in the last 72 hours. Fasting Lipid Panel No results for input(s): CHOL, HDL, LDLCALC, TRIG, CHOLHDL, LDLDIRECT in the last 72 hours. Thyroid Function Tests No results for input(s): TSH, T4TOTAL, T3FREE, THYROIDAB in the last 72 hours.  Invalid input(s): FREET3 _____________  DG Chest 2 View  Result Date: 07/22/2020 CLINICAL DATA:  Pacemaker. EXAM: CHEST - 2 VIEW COMPARISON:  07/25/2017. FINDINGS: Cardiac pacer with lead tips over the right atrium right ventricle. Heart size stable. No pulmonary venous congestion. No focal infiltrate. No pleural effusion or pneumothorax. Degenerative change thoracic spine. IMPRESSION: 1. Cardiac pacer with lead tips over the right atrium right ventricle. No pneumothorax. 2.  No acute cardiopulmonary disease. Electronically Signed   By: Marcello Moores  Register   On: 07/22/2020 06:52   EP PPM/ICD IMPLANT  Result Date: 07/21/2020 1. Implantation of new dual chamber permanent pacemaker 2. Fluoroscopy and left upper extremity venography 3. Light sedation Reason for procedure: Symptomatic bradycardia due to: Sinus node dysfunction Tachycardia-bradycardia syndrome (paroxysmal atrial fibrillation) Bradycardia due to necessary medications Procedure performed by: Sanda Klein, MD Complications: None Estimated blood loss: <10 mL Medications administered during procedure: Ancef 1 g intravenously Vancomycin 1 g intravenously Lidocaine 1% 30 mL locally, Fentanyl 100 mcg intravenously Versed 4 mg intravenously Device details: Psychologist, counselling MRI EL DR  Model [id number], serial number [id number] Right atrial lead [employer] model [phone] serial number [id number] Right ventricular lead  [employer] model 7840-52 serial number [id number] Procedure details: After the risks and benefits of the procedure were discussed the patient provided informed consent and was brought to the cardiac cath lab in the fasting state. The patient was prepped and draped in usual sterile fashion. Local anesthesia with 1% lidocaine was administered to to the infraclavicular area. A 5-6 cm horizontal incision was made parallel with and 2-3 cm caudal to the left clavicle. Using electrocautery and blunt dissection a prepectoral pocket was created down to the level of the pectoralis major muscle fascia. The pocket was carefully inspected for hemostasis. Under fluoroscopic guidance and using the modified Seldinger technique 2 separate venipunctures were performed to access the left subclavian vein. Some difficulty was encountered accessing the vein, so a venogram was performed.  Two J-tip guidewires were subsequently exchanged for two 7 French safe sheaths. Under fluoroscopic guidance the ventricular lead was advanced to level of the mid to apical right ventricular septum and thet active-fixation helix was deployed. Prominent current of injury was seen. Satisfactory pacing and sensing parameters were recorded. There was no evidence of diaphragmatic stimulation at maximum device output. In similar fashion the right atrial lead was advanced to the level of the atrial appendage. The active-fixation helix was deployed. There was prominent current of injury. Satisfactory  pacing and sensing parameters were recorded. There was no evidence of diaphragmatic stimulation with pacing at maximum device output. The safe sheath was peeled away and the lead was secured in  place with 2-0 silk.  The ventricular safe sheath was peeled away and there was a surprising amount of bleeding around the lead, that persisted despite suturing. Hemostats were used to control the bleeding and the insulation of the ventricular lead was  accidentally breached. The lead fixation helix was retracted and the damaged lead was explanted. Hemostasis was easily achieved once the lead was removed. Another venipuncture was performed, caudal to the previous one, and a safe sheath and ventricular lead were introduced. The lead was advanced and deployed in a similar position at the apical septum. There was prominent current of injury. Satisfactory  pacing and sensing parameters were recorded. There was no evidence of diaphragmatic stimulation with pacing at maximum device output. The safe sheath was peeled away and the lead was secured in place with 2-0 silk. The pocket was flushed with copious amounts of antibiotic solution. Reinspection showed excellent hemostasis.. The ventricular lead was connected to the generator and appropriate ventricular pacing was seen. Subsequently the atrial lead was also connected. Repeat testing of the lead parameters via telemetry showed excellent values. The entire system was then carefully inserted in the pocket with care been taking that the leads and device assumed a comfortable position without pressure on the incision. Great care was taken that the leads be located deep to the generator. The pocket was then closed in layers using 2 layers of 2-0 Vicryl, one layer of 3-0 Vicryl and cutaneous steristrips, after which a sterile dressing was applied. During the procedure, while attempting to achieve pocket hemostasis, the patient described numbness in the left hand. This improved as pressure was relieved from the area, and improved further after the procedure was completed. She had normal radial and ulnar pulses, normal finger and hand motion and warm fingertips. Nevertheless, the decision was made to monitor overnight for resolution of neuropathic symptoms and to monitor for bleeding. At the end of the procedure the following lead parameters were encountered: Right atrial lead sensed P waves 5.5, impedance 652 ohms, threshold  0.5 V at 0.4 ms pulse width. Right ventricular lead sensed R waves 14.6 mV, impedance 775 ohms, threshold 0.4 V at 0.4 ms pulse width.  Disposition   Pt is being discharged home today in good condition.  Follow-up Plans & Appointments     Follow-up Information    [facility] [facility] Follow up on 07/31/2020.   Specialty: Cardiology Why: Please arrive 15 minutes early for your 12:00pm wound check appointment Contact information: [address] [place] [phone]       Sanda Klein, MD Follow up on 10/27/2020.   Specialty: Cardiology Why: Please arrive 15 minutes early for your 11:20am appointment Contact information: [address] [place] [address] [phone]        Kathyrn Lass, MD.   Specialty: Family Medicine Why: Please follow up in a week Contact information: [place] [address] [phone]              Discharge Instructions    Diet - low sodium heart healthy   Complete by: As directed    Increase activity slowly   Complete by: As directed       Discharge Medications   Allergies as of 07/22/2020   No Known Allergies     Medication List    TAKE these medications   apixaban 5 MG Tabs tablet Commonly known as: Eliquis Take 1 tablet (5 mg total) by mouth  2 (two) times daily. Notes to patient: Restart 07/25/20   gabapentin 300 MG capsule Commonly known as: NEURONTIN Take 300 mg by mouth at bedtime.   hydrochlorothiazide 12.5 MG tablet Commonly known as: HYDRODIURIL TAKE 1 TABLET EACH DAY. What changed: See the new instructions.   levothyroxine 75 MCG tablet Commonly known as: SYNTHROID Take 75 mcg by mouth daily before breakfast.   lisinopril 40 MG tablet Commonly known as: ZESTRIL TAKE 1 TABLET EACH DAY. What changed: See the new instructions.   rosuvastatin 20 MG tablet Commonly known as: CRESTOR TAKE 1 TABLET ONCE DAILY.   Vitamin D3  50 MCG (2000 UT) Tabs Take 2,000 Units by mouth daily.          Outstanding Labs/Studies   Wound check 07/31/20  Duration of Discharge Encounter   Greater than 30 minutes including physician time.  Signed, Abigail Butts, PA-C 07/22/2020, 1:31 PM

## 2020-07-24 ENCOUNTER — Telehealth: Payer: Self-pay

## 2020-07-24 NOTE — Telephone Encounter (Signed)
See My chart message

## 2020-07-24 NOTE — Telephone Encounter (Signed)
Patient called in stating that she just got implanted and it was time for her to take off her outer dressing and her skin came off with it. Patient was asked to send a picture of it to our email. Patient verbalized understanding and she will receive a call after we get the photo

## 2020-07-24 NOTE — Telephone Encounter (Signed)
Tried calling patient back with no answer. Patient still has not sent a email of the picture of her skin that came off with outer dressing.

## 2020-07-28 ENCOUNTER — Other Ambulatory Visit: Payer: Self-pay

## 2020-07-28 ENCOUNTER — Ambulatory Visit (INDEPENDENT_AMBULATORY_CARE_PROVIDER_SITE_OTHER): Payer: PPO | Admitting: Emergency Medicine

## 2020-07-28 ENCOUNTER — Telehealth: Payer: Self-pay | Admitting: *Deleted

## 2020-07-28 DIAGNOSIS — I495 Sick sinus syndrome: Secondary | ICD-10-CM

## 2020-07-28 NOTE — Patient Instructions (Signed)
Apply an ice pack to wound site for 20 minutes at a time 4 times a day. Take Tylenol 2 tablets every 6 hours as needed for pain. Call the office if you have increased swelling ,or any drainage or bleeding from the wound site.  Wound check appointment 07/31/20.  Device Clinic: [phone] -Monday - Friday 8-4 pm.

## 2020-07-28 NOTE — Telephone Encounter (Signed)
Spoke to patient regarding pacer site Saturday she noticed more soreness and swelling Has not been able to wear a bra since procedure secondary to soreness Denies redness, may feel a little warmer to touch but states hard to tell Will forward to Dr Sallyanne Kuster for review

## 2020-07-28 NOTE — Telephone Encounter (Signed)
Spoke with patient, confirmed no fever, chills, or drainage Reviewed recommendations  Asked patient to upload picture of site on mychart Will forward to device clinic to see if patient can be seen before 4/14

## 2020-07-28 NOTE — Telephone Encounter (Signed)
Melinda, LPN called to see if we can get the patient to come in sooner than Thursday. The patient been having some swelling at the pacemaker site. I looked at Tuesday and the device clinic is full and double booked. I told her to have the patient to send a picture in My chart and my device triage nurse will give the patient a call back.

## 2020-07-28 NOTE — Telephone Encounter (Signed)
Please ask Michele Thompson to stop the Eliquis until we get her back in clinic. I do not think an ice pack will necessarily help the sweliing, but she can use one if it provides symptom relief. I assume no fever or chills or drainage, correct?

## 2020-07-28 NOTE — Telephone Encounter (Signed)
Patient contacted and reports increased edema and pain at wound site. She stopped her Eliquis today per Dr Victorino December note and she sent a picture. Picture show edema. She reports no fever, chills, drainage , but reports she may have bleeding underneath steri-strips. Patient scheduled to come in for wound site evaluation today at 1530.

## 2020-07-28 NOTE — Telephone Encounter (Signed)
Left message to call back   Swelling at pacemaker site  From  [name] To  Sanda Klein, MD Sent  07/28/2020 8:40 AM     This is day 7 after implant.  The last two days swelling has increased.  Would it help if I used an ice pack to reduce the swelling?  Thanks, Michele Thompson

## 2020-07-28 NOTE — Telephone Encounter (Signed)
LMOM to call the device clinic with DC direct # and office hours.

## 2020-07-29 NOTE — Progress Notes (Signed)
Wound site with small amount of edema. Steri-strips intact , dry, no bleeding or drainage from wound site. Patient to call if edema increase or there is any change in wound site. Follow-up wound check scheduled for 07/31/20.

## 2020-07-31 ENCOUNTER — Ambulatory Visit (INDEPENDENT_AMBULATORY_CARE_PROVIDER_SITE_OTHER): Payer: PPO | Admitting: Emergency Medicine

## 2020-07-31 ENCOUNTER — Other Ambulatory Visit: Payer: Self-pay

## 2020-07-31 DIAGNOSIS — I495 Sick sinus syndrome: Secondary | ICD-10-CM | POA: Diagnosis not present

## 2020-07-31 NOTE — Patient Instructions (Addendum)
Restart your Eliquis 5 mg twice a day on 08/01/20.  Wash the wound site every day with warm soap and water using a clean wash cloth and towel each time. Call the device clinic if you have increased swelling at the wound site or if you develop drainage redness, or bleeding at the wound site. Call the office if you develop a fever or chills. If you develop a fever or chills after office hours on the weekend be seen at an urgent care center or [facility] emergency room.  Device Clinic: [phone] After hours on-call: [phone]

## 2020-08-06 DIAGNOSIS — I25119 Atherosclerotic heart disease of native coronary artery with unspecified angina pectoris: Secondary | ICD-10-CM | POA: Diagnosis not present

## 2020-08-06 DIAGNOSIS — I480 Paroxysmal atrial fibrillation: Secondary | ICD-10-CM | POA: Diagnosis not present

## 2020-08-06 DIAGNOSIS — E7800 Pure hypercholesterolemia, unspecified: Secondary | ICD-10-CM | POA: Diagnosis not present

## 2020-08-06 DIAGNOSIS — E890 Postprocedural hypothyroidism: Secondary | ICD-10-CM | POA: Diagnosis not present

## 2020-08-06 DIAGNOSIS — I10 Essential (primary) hypertension: Secondary | ICD-10-CM | POA: Diagnosis not present

## 2020-08-07 LAB — CUP PACEART INCLINIC DEVICE CHECK
Brady Statistic RA Percent Paced: 69 %
Brady Statistic RA Percent Paced: 69 %
Brady Statistic RV Percent Paced: 1 %
Brady Statistic RV Percent Paced: 1 %
Date Time Interrogation Session: 20220414000000
Date Time Interrogation Session: 20220414000000
Implantable Lead Implant Date: 20220406
Implantable Lead Implant Date: 20220406
Implantable Lead Implant Date: 20220406
Implantable Lead Implant Date: 20220406
Implantable Lead Location: 753859
Implantable Lead Location: 753859
Implantable Lead Location: 753860
Implantable Lead Location: 753860
Implantable Lead Model: 7840
Implantable Lead Model: 7840
Implantable Lead Model: 7841
Implantable Lead Model: 7841
Implantable Lead Serial Number: 1037262
Implantable Lead Serial Number: 1037262
Implantable Lead Serial Number: 1127954
Implantable Lead Serial Number: 1127954
Implantable Pulse Generator Implant Date: 20220406
Implantable Pulse Generator Implant Date: 20220406
Lead Channel Impedance Value: 714 Ohm
Lead Channel Impedance Value: 714 Ohm
Lead Channel Impedance Value: 913 Ohm
Lead Channel Impedance Value: 913 Ohm
Lead Channel Pacing Threshold Amplitude: 0.7 V
Lead Channel Pacing Threshold Amplitude: 0.7 V
Lead Channel Pacing Threshold Amplitude: 0.8 V
Lead Channel Pacing Threshold Amplitude: 0.8 V
Lead Channel Pacing Threshold Pulse Width: 0.4 ms
Lead Channel Pacing Threshold Pulse Width: 0.4 ms
Lead Channel Pacing Threshold Pulse Width: 0.4 ms
Lead Channel Pacing Threshold Pulse Width: 0.4 ms
Lead Channel Sensing Intrinsic Amplitude: 19.2 mV
Lead Channel Sensing Intrinsic Amplitude: 19.2 mV
Lead Channel Sensing Intrinsic Amplitude: 5.8 mV
Lead Channel Sensing Intrinsic Amplitude: 5.8 mV
Lead Channel Setting Pacing Amplitude: 3.5 V
Lead Channel Setting Pacing Amplitude: 3.5 V
Lead Channel Setting Pacing Amplitude: 3.5 V
Lead Channel Setting Pacing Amplitude: 3.5 V
Lead Channel Setting Pacing Pulse Width: 0.4 ms
Lead Channel Setting Pacing Pulse Width: 0.4 ms
Lead Channel Setting Sensing Sensitivity: 2.5 mV
Lead Channel Setting Sensing Sensitivity: 2.5 mV
Pulse Gen Serial Number: 973497
Pulse Gen Serial Number: 973497

## 2020-08-07 NOTE — Progress Notes (Addendum)
                             Wound check appointment. Steri-strips  removed. Wound without redness or edema. Incision edges approximated, wound well healed. Normal device function. Thresholds, sensing, and impedances consistent with implant measurements. Device programmed at 3.5V/auto capture programmed on for extra safety margin until 3 month visit. Histogram distribution appropriate for patient and level of activity. No mode switches or high ventricular rates noted. Patient educated about wound care, arm mobility, lifting restrictions. ROV with Dr Sallyanne Kuster on 10/27/20. Marland Kitchen

## 2020-08-12 ENCOUNTER — Emergency Department (HOSPITAL_BASED_OUTPATIENT_CLINIC_OR_DEPARTMENT_OTHER)
Admission: EM | Admit: 2020-08-12 | Discharge: 2020-08-12 | Disposition: A | Discharge status: Home / Self Care | Payer: PPO | Attending: Emergency Medicine | Admitting: Emergency Medicine

## 2020-08-12 ENCOUNTER — Emergency Department (HOSPITAL_BASED_OUTPATIENT_CLINIC_OR_DEPARTMENT_OTHER): Payer: PPO

## 2020-08-12 ENCOUNTER — Encounter (HOSPITAL_BASED_OUTPATIENT_CLINIC_OR_DEPARTMENT_OTHER): Payer: Self-pay | Admitting: Radiology

## 2020-08-12 ENCOUNTER — Other Ambulatory Visit: Payer: Self-pay

## 2020-08-12 DIAGNOSIS — Z950 Presence of cardiac pacemaker: Secondary | ICD-10-CM | POA: Diagnosis not present

## 2020-08-12 DIAGNOSIS — R519 Headache, unspecified: Secondary | ICD-10-CM

## 2020-08-12 DIAGNOSIS — Z96651 Presence of right artificial knee joint: Secondary | ICD-10-CM | POA: Diagnosis not present

## 2020-08-12 DIAGNOSIS — Z79899 Other long term (current) drug therapy: Secondary | ICD-10-CM | POA: Diagnosis not present

## 2020-08-12 DIAGNOSIS — I2510 Atherosclerotic heart disease of native coronary artery without angina pectoris: Secondary | ICD-10-CM | POA: Insufficient documentation

## 2020-08-12 DIAGNOSIS — Z7901 Long term (current) use of anticoagulants: Secondary | ICD-10-CM | POA: Insufficient documentation

## 2020-08-12 DIAGNOSIS — H538 Other visual disturbances: Secondary | ICD-10-CM

## 2020-08-12 DIAGNOSIS — I10 Essential (primary) hypertension: Secondary | ICD-10-CM | POA: Diagnosis not present

## 2020-08-12 DIAGNOSIS — R42 Dizziness and giddiness: Secondary | ICD-10-CM | POA: Insufficient documentation

## 2020-08-12 DIAGNOSIS — E039 Hypothyroidism, unspecified: Secondary | ICD-10-CM | POA: Insufficient documentation

## 2020-08-12 DIAGNOSIS — H539 Unspecified visual disturbance: Secondary | ICD-10-CM | POA: Diagnosis not present

## 2020-08-12 DIAGNOSIS — R29818 Other symptoms and signs involving the nervous system: Secondary | ICD-10-CM | POA: Diagnosis not present

## 2020-08-12 DIAGNOSIS — I4891 Unspecified atrial fibrillation: Secondary | ICD-10-CM | POA: Diagnosis not present

## 2020-08-12 LAB — BASIC METABOLIC PANEL
Anion gap: 8 (ref 5–15)
BUN: 19 mg/dL (ref 8–23)
CO2: 27 mmol/L (ref 22–32)
Calcium: 9.5 mg/dL (ref 8.9–10.3)
Chloride: 103 mmol/L (ref 98–111)
Creatinine, Ser: 0.72 mg/dL (ref 0.44–1.00)
GFR, Estimated: >60 mL/min (ref >60)
Glucose, Bld: 98 mg/dL (ref 70–99)
Potassium: 3.9 mmol/L (ref 3.5–5.1)
Sodium: 138 mmol/L (ref 135–145)

## 2020-08-12 LAB — CBC
HCT: 37.7 % (ref 36.0–46.0)
Hemoglobin: 12.7 g/dL (ref 12.0–15.0)
MCH: 31.5 pg (ref 26.0–34.0)
MCHC: 33.7 g/dL (ref 30.0–36.0)
MCV: 93.5 fL (ref 80.0–100.0)
Platelets: 159 10*3/uL (ref 150–400)
RBC: 4.03 MIL/uL (ref 3.87–5.11)
RDW: 12.8 % (ref 11.5–15.5)
WBC: 5.1 10*3/uL (ref 4.0–10.5)
nRBC: 0 % (ref 0.0–0.2)

## 2020-08-12 LAB — CBG MONITORING, ED: Glucose-Capillary: 91 mg/dL (ref 70–99)

## 2020-08-12 MED ORDER — ACETAMINOPHEN 325 MG PO TABS
650.0000 mg | ORAL_TABLET | Freq: Once | ORAL | Status: AC
  Administered 2020-08-12: 650 mg via ORAL
  Filled 2020-08-12: qty 2

## 2020-08-12 MED ORDER — IOHEXOL 350 MG/ML SOLN
75.0000 mL | Freq: Once | INTRAVENOUS | Status: AC | PRN
  Administered 2020-08-12: 75 mL via INTRAVENOUS

## 2020-08-12 NOTE — ED Triage Notes (Signed)
Two days ago the Pt started experiencing  blurriness as she read and the blurriness has gotten progressively worse.Today Pt stated that she started seeing black spots in her right eye and  SOB.  Pt denies pain, N/V.

## 2020-08-12 NOTE — ED Notes (Signed)
Pt ambulatory back from bathroom.  Hooked back up to monitor.

## 2020-08-12 NOTE — ED Notes (Signed)
Patient transported to CT 

## 2020-08-12 NOTE — ED Provider Notes (Signed)
[facility] Provider Note   CSN: [id number] Arrival date & time: 08/12/20  1055     History Chief Complaint  Patient presents with  . Blurred Vision    Michele Thompson is a 80 y.o. female.  HPI Patient presents with vision changes.  And headache.  Began this morning.  States she was trying to read some music and could not do it because both eyes were blurry.  States she ended up with some flashes in the peripheral side of her right eye.  States it was on the right side only.  States both eyes were blurry but worse on the right.  Also developed a headache.  Is on the front of the back of her head.  Patient has had headaches but does not tend to get them.  She is on anticoagulation for history of A. fib and previous pulmonary embolism.  Did somewhat recently have a pacemaker placed.  States she went for a walk earlier and began to feel a little dizzy and just not good overall.  No lateralizing numbness or weakness.  No confusion.  No trauma.  Patient states she thinks that some Tylenol may help enough with her headache.    Past Medical History:  Diagnosis Date  . Anxiety   . Aortic atherosclerosis ([place])   . Arthritis   . Constipation   . Coronary artery disease 03/16/2016   a. admx with angina and mildly elevated Troponin >> LHC: mLAD 30 >> CCB started for poss microvascular angina  . Depression   . Diverticulosis   . Edema of both lower extremities   . Fatty liver   . History of MI (myocardial infarction)   . Hyperlipidemia 03/16/2016  . Hypertension   . Hypothyroidism   . Joint pain   . Pacemaker   . Pacemaker   . Paroxysmal A-fib ([place])   . PE (pulmonary thromboembolism) ([place]) 03/17/2016   Bilateral submassive pulmonary embolus with RUE and RLE DVT on venous duplex 02/2016 c/b PEA arrest // Echo 03/21/16: mild LVH, EF 60-65, no RWMA, Gr 1 DD, normal RVSF  . Psoas tendinitis   . Sinus bradycardia 03/31/2016  . Thrombocytopenia ([place]) 02/2016    Profound thrombocytopenia noted upon admission for pulmonary embolism >> initially thought to be from Hep induced thrombocytopenia given recent admit for cardiac cath and Heparin use // However, HIT Ab was neg (0.184) // Hematology consult pending  . Thyroid disease   . Unilateral primary osteoarthritis, right knee   . Vascular malformation    mid ascending colon  . Vitamin D deficiency     Patient Active Problem List   Diagnosis Date Noted  . Tachycardia-bradycardia syndrome ([place]) 07/21/2020  . Insulin resistance 05/16/2019  . Class 1 obesity with serious comorbidity and body mass index (BMI) of 31.0 to 31.9 in adult 03/13/2019  . History of pulmonary embolism 03/28/2018  . Paroxysmal atrial fibrillation ([place]) 03/28/2018  . Chronotropic incompetence with sinus node dysfunction ([facility]) 05/26/2017  . OA (osteoarthritis) of knee 12/27/2016  . Pre-operative cardiovascular examination 10/26/2016  . Chronic anticoagulation 10/26/2016  . History of DVT (deep vein thrombosis) 04/23/2016  . Coronary artery disease involving native coronary artery of native heart with angina pectoris with documented spasm ([place]) 03/31/2016  . Sinus bradycardia 03/31/2016  . NSTEMI (non-ST elevated myocardial infarction) ([place])   . PE (pulmonary thromboembolism) ([place]) 03/17/2016  . Hypothyroidism 03/17/2016  . Rectal bleeding 03/17/2016  . Headache 03/17/2016  . Hypercholesterolemia 03/16/2016  . Essential  hypertension 03/16/2016    Past Surgical History:  Procedure Laterality Date  . CARDIAC CATHETERIZATION N/A 03/15/2016   Procedure: Left Heart Cath and Coronary Angiography;  Surgeon: [name], MD;  Location: [facility];  Service: Cardiovascular;  Laterality: N/A;  . INSERT / REPLACE / REMOVE PACEMAKER    . PACEMAKER IMPLANT N/A 07/21/2020   Procedure: PACEMAKER IMPLANT;  Surgeon: Sanda Klein, MD;  Location: [facility];  Service: Cardiovascular;  Laterality: N/A;  . TOTAL KNEE  ARTHROPLASTY Right 12/27/2016   Procedure: RIGHT TOTAL KNEE ARTHROPLASTY;  Surgeon: Gaynelle Arabian, MD;  Location: [facility];  Service: Orthopedics;  Laterality: Right;  Marland Kitchen VAGINAL HYSTERECTOMY  1983   endometriosis     OB History    Gravida  2   Para  2   Term  2   Preterm      AB      Living  2     SAB      IAB      Ectopic      Multiple      Live Births              Family History  Problem Relation Age of Onset  . Heart failure Mother   . Heart disease Mother   . Heart failure Father   . High blood pressure Father   . Stroke Father   . Heart disease Father   . Heart failure Maternal Grandmother   . Myasthenia gravis Brother   . Stroke Brother   . Clotting disorder Neg Hx   . Colon cancer Neg Hx     Social History   Tobacco Use  . Smoking status: Never Smoker  . Smokeless tobacco: Never Used  Vaping Use  . Vaping Use: Never used  Substance Use Topics  . Alcohol use: No    Alcohol/week: 0.0 standard drinks  . Drug use: No    Home Medications Prior to Admission medications   Medication Sig Start Date End Date Taking? Authorizing Provider  apixaban (ELIQUIS) 5 MG TABS tablet Take 1 tablet (5 mg total) by mouth 2 (two) times daily. 07/17/20  Yes Croitoru, Mihai, MD  Cholecalciferol (VITAMIN D3) 2000 units TABS Take 2,000 Units by mouth daily.   Yes [provider]  gabapentin (NEURONTIN) 300 MG capsule Take 300 mg by mouth at bedtime. 06/20/17  Yes [provider]  hydrochlorothiazide (HYDRODIURIL) 12.5 MG tablet TAKE 1 TABLET EACH DAY. Patient taking differently: Take 12.5 mg by mouth daily. 07/04/20  Yes Croitoru, Mihai, MD  levothyroxine (SYNTHROID) 75 MCG tablet Take 75 mcg by mouth daily before breakfast. 03/01/19  Yes [provider]  lisinopril (ZESTRIL) 40 MG tablet TAKE 1 TABLET EACH DAY. Patient taking differently: Take 40 mg by mouth daily. 07/04/20  Yes Croitoru, Mihai, MD  rosuvastatin (CRESTOR) 20 MG tablet TAKE 1  TABLET ONCE DAILY. Patient taking differently: Take 20 mg by mouth daily. 07/04/20  Yes Croitoru, Dani Gobble, MD    Allergies    Patient has no known allergies.  Review of Systems   Review of Systems  Constitutional: Negative for appetite change.  HENT: Negative for congestion.   Eyes: Positive for visual disturbance. Negative for redness.  Respiratory: Negative for shortness of breath.   Gastrointestinal: Negative for abdominal pain.  Genitourinary: Negative for dyspareunia.  Musculoskeletal: Negative for back pain.  Skin: Negative for rash.  Neurological: Positive for dizziness and headaches.  Psychiatric/Behavioral: Negative for confusion.    Physical  Exam Updated Vital Signs BP (!) 176/70 (BP Location: Left Arm)   Pulse 66   Temp 97.6 F (36.4 C) (Oral)   Resp 14   Ht 5\' 2"  (1.575 m)   Wt 81.6 kg   SpO2 100%   BMI 32.92 kg/m   Physical Exam Vitals reviewed.  HENT:     Head: Atraumatic.  Eyes:     Extraocular Movements: Extraocular movements intact.     Pupils: Pupils are equal, round, and reactive to light.     Comments: Pupils reactive but mildly constricted.  Some mild nystagmus with movement.  Cardiovascular:     Rate and Rhythm: Normal rate and regular rhythm.  Pulmonary:     Breath sounds: No wheezing.  Musculoskeletal:        General: No tenderness.     Cervical back: Neck supple.  Skin:    General: Skin is warm.  Neurological:     Mental Status: She is alert and oriented to person, place, and time.     Comments: Eye movement intact.  Visual fields grossly intact by confrontation.  Some mild nystagmus.  Finger-nose intact bilaterally.  Awake and appropriate.  Moving all extremities     ED Results / Procedures / Treatments   Labs (all labs ordered are listed, but only abnormal results are displayed) Labs Reviewed  BASIC METABOLIC PANEL  CBC  CBG MONITORING, ED    EKG EKG Interpretation  Date/Time:  Tuesday August 12 2020 11:04:26 EDT Ventricular  Rate:  77 PR Interval:  157 QRS Duration: 88 QT Interval:  390 QTC Calculation: 442 R Axis:   30 Text Interpretation: Sinus rhythm Low voltage, precordial leads Confirmed by Davonna Belling [id number]) on 08/12/2020 12:48:02 PM   Radiology CT Angio Head W or Wo Contrast  Result Date: 08/12/2020 CLINICAL DATA:  Acute neuro deficit.  Blurred vision EXAM: CT ANGIOGRAPHY HEAD AND NECK TECHNIQUE: Multidetector CT imaging of the head and neck was performed using the standard protocol during bolus administration of intravenous contrast. Multiplanar CT image reconstructions and MIPs were obtained to evaluate the vascular anatomy. Carotid stenosis measurements (when applicable) are obtained utilizing NASCET criteria, using the distal internal carotid diameter as the denominator. CONTRAST:  87mL OMNIPAQUE IOHEXOL 350 MG/ML SOLN COMPARISON:  CT head 03/17/2016 FINDINGS: CT HEAD FINDINGS Brain: No evidence of acute infarction, hemorrhage, hydrocephalus, extra-axial collection or mass lesion/mass effect. Vascular: Negative for hyperdense vessel Skull: Negative Sinuses: Negative Orbits: Bilateral cataract extraction Review of the MIP images confirms the above findings CTA NECK FINDINGS Aortic arch: Minimal atherosclerotic calcification aortic arch and proximal great vessels. Bovine branching arch. Right carotid system: Normal right carotid. Negative for atherosclerotic disease or dissection Left carotid system: Normal left carotid. Negative for atherosclerotic disease or dissection Vertebral arteries: Both vertebral arteries are normal and widely patent. Skeleton: No acute skeletal abnormality. Other neck: Negative for mass or adenopathy in the neck. Upper chest: Lung apices clear bilaterally. Transvenous pacemaker on the left. Review of the MIP images confirms the above findings CTA HEAD FINDINGS Anterior circulation: Cavernous carotid artery normal bilaterally without stenosis. Anterior and middle cerebral arteries  normal bilaterally without stenosis or large vessel occlusion. No vascular malformation. Posterior circulation: Both vertebral arteries widely patent to the basilar. Basilar widely patent. Posterior cerebral arteries normal bilaterally. Fetal origin right posterior cerebral artery. Venous sinuses: Normal venous enhancement Anatomic variants: None Review of the MIP images confirms the above findings IMPRESSION: Essentially normal CT angiogram head and neck. No significant arterial stenosis or  large vessel occlusion Negative CT of the head. Electronically Signed   By: Franchot Gallo M.D.   On: 08/12/2020 13:33   CT Angio Neck W and/or Wo Contrast  Result Date: 08/12/2020 CLINICAL DATA:  Acute neuro deficit.  Blurred vision EXAM: CT ANGIOGRAPHY HEAD AND NECK TECHNIQUE: Multidetector CT imaging of the head and neck was performed using the standard protocol during bolus administration of intravenous contrast. Multiplanar CT image reconstructions and MIPs were obtained to evaluate the vascular anatomy. Carotid stenosis measurements (when applicable) are obtained utilizing NASCET criteria, using the distal internal carotid diameter as the denominator. CONTRAST:  61mL OMNIPAQUE IOHEXOL 350 MG/ML SOLN COMPARISON:  CT head 03/17/2016 FINDINGS: CT HEAD FINDINGS Brain: No evidence of acute infarction, hemorrhage, hydrocephalus, extra-axial collection or mass lesion/mass effect. Vascular: Negative for hyperdense vessel Skull: Negative Sinuses: Negative Orbits: Bilateral cataract extraction Review of the MIP images confirms the above findings CTA NECK FINDINGS Aortic arch: Minimal atherosclerotic calcification aortic arch and proximal great vessels. Bovine branching arch. Right carotid system: Normal right carotid. Negative for atherosclerotic disease or dissection Left carotid system: Normal left carotid. Negative for atherosclerotic disease or dissection Vertebral arteries: Both vertebral arteries are normal and widely  patent. Skeleton: No acute skeletal abnormality. Other neck: Negative for mass or adenopathy in the neck. Upper chest: Lung apices clear bilaterally. Transvenous pacemaker on the left. Review of the MIP images confirms the above findings CTA HEAD FINDINGS Anterior circulation: Cavernous carotid artery normal bilaterally without stenosis. Anterior and middle cerebral arteries normal bilaterally without stenosis or large vessel occlusion. No vascular malformation. Posterior circulation: Both vertebral arteries widely patent to the basilar. Basilar widely patent. Posterior cerebral arteries normal bilaterally. Fetal origin right posterior cerebral artery. Venous sinuses: Normal venous enhancement Anatomic variants: None Review of the MIP images confirms the above findings IMPRESSION: Essentially normal CT angiogram head and neck. No significant arterial stenosis or large vessel occlusion Negative CT of the head. Electronically Signed   By: Franchot Gallo M.D.   On: 08/12/2020 13:33    Procedures Procedures   Medications Ordered in ED Medications  acetaminophen (TYLENOL) tablet 650 mg (650 mg Oral Given 08/12/20 1317)  iohexol (OMNIPAQUE) 350 MG/ML injection 75 mL (75 mLs Intravenous Contrast Given 08/12/20 1242)    ED Course  I have reviewed the triage vital signs and the nursing notes.  Pertinent labs & imaging results that were available during my care of the patient were reviewed by me and considered in my medical decision making (see chart for details).    MDM Rules/Calculators/A&P                         Patient presents with headache and visual changes.  On anticoagulation for A. fib and previous PEs.  Also recent pacemaker.  Somewhat elevated blood pressure initially.  Has finger-nose intact but some mild nystagmus.  Visual fields grossly intact.  Able to move eyes completely.  Has a few flashers on the right bilateral visual field.  Symptoms improved with Tylenol.  Headache improving.  Head CT  with angiography done due to anticoagulation.  And history of A. fib.  It was reassuring. No intracranial hemorrhage found.  I reviewed the images myself.  Stroke felt as a less likely cause of the symptoms.  Potentially some other ocular cause of the flashers such as vitreous detachment.  Has ophthalmology that she states she will follow-up with soon as possible.  States they are usually good about  getting urine.  Do not think she needs transfer for further evaluation this time.  Will discharge home. Final Clinical Impression(s) / ED Diagnoses Final diagnoses:  Nonintractable headache, unspecified chronicity pattern, unspecified headache type  Blurred vision, bilateral  Flashing lights seen    Rx / DC Orders ED Discharge Orders    None       Davonna Belling, MD 08/12/20 1448

## 2020-08-12 NOTE — Discharge Instructions (Addendum)
Follow-up with your ophthalmologist as soon as possible.

## 2020-08-12 NOTE — ED Notes (Signed)
Pt stated that she was seeing more clearly than when she arrived.

## 2020-08-13 NOTE — Telephone Encounter (Signed)
Hi, Mrs. Deuser. Sorry to hear you had to go to the ED[facility]. Is your BP elevated only when you are anxious? What does your BP run when you are relaxed? I generally prefer to let the primary care physician deal with medication for anxiety. Have you discussed this with Dr. Kathyrn Lass? Dr. Loletha Grayer

## 2020-08-15 DIAGNOSIS — F419 Anxiety disorder, unspecified: Secondary | ICD-10-CM | POA: Diagnosis not present

## 2020-08-15 DIAGNOSIS — I480 Paroxysmal atrial fibrillation: Secondary | ICD-10-CM | POA: Diagnosis not present

## 2020-08-15 DIAGNOSIS — I10 Essential (primary) hypertension: Secondary | ICD-10-CM | POA: Diagnosis not present

## 2020-08-15 DIAGNOSIS — I25119 Atherosclerotic heart disease of native coronary artery with unspecified angina pectoris: Secondary | ICD-10-CM | POA: Diagnosis not present

## 2020-08-21 DIAGNOSIS — R519 Headache, unspecified: Secondary | ICD-10-CM | POA: Diagnosis not present

## 2020-08-28 ENCOUNTER — Other Ambulatory Visit: Payer: Self-pay

## 2020-08-28 MED ORDER — ELIQUIS 5 MG PO TABS: 5.0000 mg | ORAL_TABLET | Freq: Two times a day (BID) | ORAL | 1 refills | Status: DC

## 2020-08-28 NOTE — Telephone Encounter (Signed)
6f, 78.9kg, scr 0.72 08/12/20, lovw/croitoru 04/30/20

## 2020-10-08 DIAGNOSIS — M545 Low back pain, unspecified: Secondary | ICD-10-CM | POA: Diagnosis not present

## 2020-10-08 DIAGNOSIS — G8929 Other chronic pain: Secondary | ICD-10-CM | POA: Diagnosis not present

## 2020-10-09 ENCOUNTER — Ambulatory Visit
Admission: RE | Admit: 2020-10-09 | Discharge: 2020-10-09 | Disposition: A | Discharge status: Home / Self Care | Payer: PPO | Source: Ambulatory Visit | Attending: Family Medicine | Admitting: Family Medicine

## 2020-10-09 ENCOUNTER — Other Ambulatory Visit: Payer: Self-pay

## 2020-10-09 ENCOUNTER — Other Ambulatory Visit: Payer: Self-pay | Admitting: Family Medicine

## 2020-10-09 DIAGNOSIS — M545 Low back pain, unspecified: Secondary | ICD-10-CM | POA: Diagnosis not present

## 2020-10-13 ENCOUNTER — Other Ambulatory Visit: Payer: Self-pay | Admitting: Family Medicine

## 2020-10-13 DIAGNOSIS — Z1231 Encounter for screening mammogram for malignant neoplasm of breast: Secondary | ICD-10-CM

## 2020-10-27 ENCOUNTER — Ambulatory Visit (INDEPENDENT_AMBULATORY_CARE_PROVIDER_SITE_OTHER): Payer: PPO | Admitting: Cardiovascular Disease

## 2020-10-27 ENCOUNTER — Other Ambulatory Visit: Payer: Self-pay

## 2020-10-27 ENCOUNTER — Encounter: Payer: Self-pay | Admitting: Cardiovascular Disease

## 2020-10-27 VITALS — BP 114/60 | HR 65 | Ht 62.0 in

## 2020-10-27 DIAGNOSIS — E7800 Pure hypercholesterolemia, unspecified: Secondary | ICD-10-CM

## 2020-10-27 DIAGNOSIS — I25111 Atherosclerotic heart disease of native coronary artery with angina pectoris with documented spasm: Secondary | ICD-10-CM | POA: Diagnosis not present

## 2020-10-27 DIAGNOSIS — Z7901 Long term (current) use of anticoagulants: Secondary | ICD-10-CM

## 2020-10-27 DIAGNOSIS — Z86711 Personal history of pulmonary embolism: Secondary | ICD-10-CM

## 2020-10-27 DIAGNOSIS — I10 Essential (primary) hypertension: Secondary | ICD-10-CM | POA: Diagnosis not present

## 2020-10-27 DIAGNOSIS — I495 Sick sinus syndrome: Secondary | ICD-10-CM

## 2020-10-27 DIAGNOSIS — Z950 Presence of cardiac pacemaker: Secondary | ICD-10-CM

## 2020-10-27 DIAGNOSIS — I498 Other specified cardiac arrhythmias: Secondary | ICD-10-CM

## 2020-10-27 DIAGNOSIS — I480 Paroxysmal atrial fibrillation: Secondary | ICD-10-CM | POA: Diagnosis not present

## 2020-10-27 NOTE — Progress Notes (Signed)
.  Cardiology Office Note:    Date:  10/28/2020   ID:  Michele Thompson, DOB 02/26/1941, MRN [id number]  PCP:  Kathyrn Lass, MD  Cardiologist:  Sanda Klein, MD   Referring MD: Kathyrn Lass, MD   Chief Complaint  Patient presents with   Pacemaker Check     History of Present Illness:    Michele Thompson is a 80 y.o. female with a hx of massive pulmonary embolism, complicated by cardiorespiratory arrest, while on estrogen therapy in 2017.  She had an episode of paroxysmal atrial fibrillation and underwent cardioversion in August 2019.  She also has minor coronary artery disease and suspicion for coronary vasospasm on previous angiography, hypertension and hyperlipidemia.  Previous Holter monitor showed evidence of sinus node dysfunction and chronotropic incompetence while taking beta-blockers.  Consequently, she is only receiving "as needed" diltiazem for episodes of palpitations consistent with atrial fibrillation, rather than a scheduled AV blocking agent.  She has normal left ventricular systolic function on echo and had normal pulmonary function test in 2018.   She gets short of breath while performing more intense physical activity, but has increased energy since receiving the pacemaker.  She told me that she "gets up earlier and goes to bed later" ever since the pacemaker was implanted.  She has occasional brief stinging pain in the pacemaker pocket but this area has healed very well.  Interrogation of the pacemaker shows normal device function, but the heart rate histograms remain a little bit blunted.  Parameters are excellent.  Battery is at beginning of service.  There have been no episodes of atrial fibrillation or high ventricular rates.  Sensor settings were adjusted today.  The activity threshold for intervention was decreased from medium to low-medium and the rate response was increased from 6 to 8.  The minute ventilation sensor was increased from 6 to 12.    Previous work-up showed  evidence of chronotropic incompetence and persistent bradycardia.  When exercising, her heart rate tops out at about 100 bpm if she tries to push any harder she feels poorly.  When adjusted for her age, that is roughly 70% of maximum predicted heart rate.  Lipid profile is excellent most recent LDL 51 and HDL 54.  She does not have diabetes mellitus.  Past Medical History:  Diagnosis Date   Anxiety    Aortic atherosclerosis ([place])    Arthritis    Constipation    Coronary artery disease 03/16/2016   a. admx with angina and mildly elevated Troponin >> LHC: mLAD 30 >> CCB started for poss microvascular angina   Depression    Diverticulosis    Edema of both lower extremities    Fatty liver    History of MI (myocardial infarction)    Hyperlipidemia 03/16/2016   Hypertension    Hypothyroidism    Joint pain    Pacemaker    Pacemaker    Paroxysmal A-fib ([place])    PE (pulmonary thromboembolism) ([place]) 03/17/2016   Bilateral submassive pulmonary embolus with RUE and RLE DVT on venous duplex 02/2016 c/b PEA arrest // Echo 03/21/16: mild LVH, EF 60-65, no RWMA, Gr 1 DD, normal RVSF   Psoas tendinitis    Sinus bradycardia 03/31/2016   Thrombocytopenia ([place]) 02/2016   Profound thrombocytopenia noted upon admission for pulmonary embolism >> initially thought to be from Hep induced thrombocytopenia given recent admit for cardiac cath and Heparin use // However, HIT Ab was neg (0.184) // Hematology consult pending   Thyroid  disease    Unilateral primary osteoarthritis, right knee    Vascular malformation    mid ascending colon   Vitamin D deficiency     Past Surgical History:  Procedure Laterality Date   CARDIAC CATHETERIZATION N/A 03/15/2016   Procedure: Left Heart Cath and Coronary Angiography;  Surgeon: [name], MD;  Location: [facility];  Service: Cardiovascular;  Laterality: N/A;   INSERT / REPLACE / REMOVE PACEMAKER     PACEMAKER IMPLANT N/A 07/21/2020   Procedure: PACEMAKER  IMPLANT;  Surgeon: Sanda Klein, MD;  Location: [facility];  Service: Cardiovascular;  Laterality: N/A;   TOTAL KNEE ARTHROPLASTY Right 12/27/2016   Procedure: RIGHT TOTAL KNEE ARTHROPLASTY;  Surgeon: Gaynelle Arabian, MD;  Location: [facility];  Service: Orthopedics;  Laterality: Right;   VAGINAL HYSTERECTOMY  1983   endometriosis    Current Medications: Current Meds  Medication Sig   ALPRAZolam (XANAX) 0.25 MG tablet Take 0.25 mg by mouth daily as needed.   apixaban (ELIQUIS) 5 MG TABS tablet Take 1 tablet (5 mg total) by mouth 2 (two) times daily.   Cholecalciferol (VITAMIN D3) 2000 units TABS Take 2,000 Units by mouth daily.   gabapentin (NEURONTIN) 300 MG capsule Take 300 mg by mouth at bedtime.   hydrochlorothiazide (HYDRODIURIL) 12.5 MG tablet TAKE 1 TABLET EACH DAY. (Patient taking differently: Take 12.5 mg by mouth daily.)   levothyroxine (SYNTHROID) 75 MCG tablet Take 75 mcg by mouth daily before breakfast.   lisinopril (ZESTRIL) 40 MG tablet TAKE 1 TABLET EACH DAY.   rosuvastatin (CRESTOR) 20 MG tablet TAKE 1 TABLET ONCE DAILY. (Patient taking differently: Take 20 mg by mouth daily.)     Allergies:   Patient has no known allergies.   Social History   Socioeconomic History   Marital status: Widowed    Spouse name: Not on file   Number of children: Not on file   Years of education: Not on file   Highest education level: Not on file  Occupational History   Occupation: retired  Tobacco Use   Smoking status: Never   Smokeless tobacco: Never  Vaping Use   Vaping Use: Never used  Substance and Sexual Activity   Alcohol use: No    Alcohol/week: 0.0 standard drinks   Drug use: No   Sexual activity: Never    Birth control/protection: Surgical    Comment: 1st intercourse 80 yo-Fewer than 5 partners  Other Topics Concern   Not on file  Social History Narrative    Pulmonary:   Originally from [place]. Moved to [place] in 2012. Worked previously in Engineer, maintenance. Son is healthcare power of attorney.   Social Determinants of Health   Financial Resource Strain: Not on file  Food Insecurity: Not on file  Transportation Needs: Not on file  Physical Activity: Not on file  Stress: Not on file  Social Connections: Not on file     Family History: The patient's family history includes Heart disease in her father and mother; Heart failure in her father, maternal grandmother, and mother; High blood pressure in her father; Myasthenia gravis in her brother; Stroke in her brother and father. There is no history of Clotting disorder or Colon cancer.  ROS:   Please see the history of present illness.    All other systems are reviewed and are negative.  EKGs/Labs/Other Studies Reviewed:    The following studies were reviewed today: Comprehensive in office pacemaker check and device reprogramming  EKG: Is ordered today and shows sinus rhythm, normal tracing Recent Labs: 08/12/2020: BUN 19; Creatinine, Ser 0.72; Hemoglobin 12.7; Platelets 159; Potassium 3.9; Sodium 138  Recent Lipid Panel    Component Value Date/Time   CHOL 113 02/26/2019 0858   TRIG 60 02/26/2019 0858   HDL 56 02/26/2019 0858   CHOLHDL 3.2 03/15/2016 0524   VLDL 15 03/15/2016 0524   LDLCALC 44 02/26/2019 0858    Physical Exam:    VS:  BP 114/60 (BP Location: Left Arm, Patient Position: Sitting, Cuff Size: Large)   Pulse 65   Ht 5\' 2"  (1.575 m)   SpO2 97%   BMI 32.92 kg/m     Wt Readings from Last 3 Encounters:  08/12/20 180 lb (81.6 kg)  07/22/20 184 lb 8 oz (83.7 kg)  06/04/19 170 lb (77.1 kg)     General: Alert, oriented x3, no distress, the pacemaker site is very well-healed.  The pacemaker pocket is a little cranial with the device slightly impinging on the lower surface of her left clavicle.  There is no evidence of swelling, tenderness, redness or fluid in the pocket. Head: no evidence of trauma, PERRL, EOMI, no exophtalmos or lid lag, no  myxedema, no xanthelasma; normal ears, nose and oropharynx Neck: normal jugular venous pulsations and no hepatojugular reflux; brisk carotid pulses without delay and no carotid bruits Chest: clear to auscultation, no signs of consolidation by percussion or palpation, normal fremitus, symmetrical and full respiratory excursions Cardiovascular: normal position and quality of the apical impulse, regular rhythm, normal first and second heart sounds, no murmurs, rubs or gallops Abdomen: no tenderness or distention, no masses by palpation, no abnormal pulsatility or arterial bruits, normal bowel sounds, no hepatosplenomegaly Extremities: no clubbing, cyanosis or edema; 2+ radial, ulnar and brachial pulses bilaterally; 2+ right femoral, posterior tibial and dorsalis pedis pulses; 2+ left femoral, posterior tibial and dorsalis pedis pulses; no subclavian or femoral bruits Neurological: grossly nonfocal Psych: Normal mood and affect   ASSESSMENT:    1. Paroxysmal atrial fibrillation (HCC)   2. SSS (sick sinus syndrome) ([place])   3. Pacemaker   4. Coronary artery disease involving native coronary artery of native heart with angina pectoris with documented spasm ([place])   5. History of pulmonary embolism   6. Long term current use of anticoagulant   7. Essential hypertension   8. Hypercholesterolemia     PLAN:    In order of problems listed above:  PAFib: CHA2DS2-Vasc score 5 (age, CAD, HTN, female).  No recent episodes.  On appropriate anticoagulation.  Not on beta-blockers due to chronotropic incompetence, but now that we have a pacemaker there is an option to start this SSS: Overall heart rate histograms were not too bad, but there was room for additional improvement in device settings were adjusted as above.  Reevaluate in 3 months Pacemaker: Well-healed.  Normal lead and generator parameters.  Rate response sensor adjustments made today. CAD: Asymptomatic.  Mild stenoses on previous cardiac  catheterization 2017. Lipid profile is excellent on statin. Not taking aspirin due to full anticoagulation. Suspected of having coronary vasospasm, but no recent symptoms. Hx of PE: Plan lifelong anticoagulation for her atrial fibrillation anyway.  May have some degree of residual exertional dyspnea from her massive pulmonary embolism. Anticoagulation: Tolerating warfarin well without bleeding complications, falls, injuries. HTN: Well-controlled HLP: On statin with excellent lipid parameters.   Patient Instructions  Medication Instructions:  No changes *If you need a refill on your cardiac medications before  your next appointment, please call your pharmacy*   Lab Work: None ordered If you have labs (blood work) drawn today and your tests are completely normal, you will receive your results only by: [place] (if you have MyChart) OR A paper copy in the mail If you have any lab test that is abnormal or we need to change your treatment, we will call you to review the results.   Testing/Procedures: None ordered   Follow-Up: At [facility], you and your health needs are our priority.  As part of our continuing mission to provide you with exceptional heart care, we have created designated Provider Care Teams.  These Care Teams include your primary Cardiologist (physician) and Advanced Practice Providers (APPs -  Physician Assistants and Nurse Practitioners) who all work together to provide you with the care you need, when you need it.  We recommend signing up for the patient portal called "MyChart".  Sign up information is provided on this After Visit Summary.  MyChart is used to connect with patients for Virtual Visits (Telemedicine).  Patients are able to view lab/test results, encounter notes, upcoming appointments, etc.  Non-urgent messages can be sent to your provider as well.   To learn more about what you can do with MyChart, go to [email or web address].    Your next  appointment:   4 month(s) on a device day  The format for your next appointment:   In Person  Provider:   Sanda Klein, MD    Medication Adjustments/Labs and Tests Ordered: Current medicines are reviewed at length with the patient today.  Concerns regarding medicines are outlined above.  No orders of the defined types were placed in this encounter.  No orders of the defined types were placed in this encounter.   Signed, Sanda Klein, MD  10/28/2020 10:40 AM    [facility]

## 2020-10-27 NOTE — Patient Instructions (Signed)
Medication Instructions:  No changes *If you need a refill on your cardiac medications before your next appointment, please call your pharmacy*   Lab Work: None ordered If you have labs (blood work) drawn today and your tests are completely normal, you will receive your results only by: [place] (if you have MyChart) OR A paper copy in the mail If you have any lab test that is abnormal or we need to change your treatment, we will call you to review the results.   Testing/Procedures: None ordered   Follow-Up: At [facility], you and your health needs are our priority.  As part of our continuing mission to provide you with exceptional heart care, we have created designated Provider Care Teams.  These Care Teams include your primary Cardiologist (physician) and Advanced Practice Providers (APPs -  Physician Assistants and Nurse Practitioners) who all work together to provide you with the care you need, when you need it.  We recommend signing up for the patient portal called "MyChart".  Sign up information is provided on this After Visit Summary.  MyChart is used to connect with patients for Virtual Visits (Telemedicine).  Patients are able to view lab/test results, encounter notes, upcoming appointments, etc.  Non-urgent messages can be sent to your provider as well.   To learn more about what you can do with MyChart, go to [email or web address].    Your next appointment:   4 month(s) on a device day  The format for your next appointment:   In Person  Provider:   Sanda Klein, MD

## 2020-10-28 LAB — PACEMAKER DEVICE OBSERVATION

## 2020-11-03 ENCOUNTER — Encounter: Payer: Self-pay | Admitting: *Deleted

## 2020-11-04 ENCOUNTER — Ambulatory Visit (INDEPENDENT_AMBULATORY_CARE_PROVIDER_SITE_OTHER): Payer: PPO

## 2020-11-04 DIAGNOSIS — I495 Sick sinus syndrome: Secondary | ICD-10-CM

## 2020-11-04 LAB — CUP PACEART REMOTE DEVICE CHECK
Battery Remaining Longevity: 138 mo
Battery Remaining Percentage: 100 %
Brady Statistic RA Percent Paced: 78 %
Brady Statistic RV Percent Paced: 0 %
Date Time Interrogation Session: 20220719124700
Implantable Lead Implant Date: 20220406
Implantable Lead Implant Date: 20220406
Implantable Lead Location: 753859
Implantable Lead Location: 753860
Implantable Lead Model: 7840
Implantable Lead Model: 7841
Implantable Lead Serial Number: 1037262
Implantable Lead Serial Number: 1127954
Implantable Pulse Generator Implant Date: 20220406
Lead Channel Impedance Value: 1036 Ohm
Lead Channel Impedance Value: 701 Ohm
Lead Channel Pacing Threshold Amplitude: 0.5 V
Lead Channel Pacing Threshold Amplitude: 0.7 V
Lead Channel Pacing Threshold Pulse Width: 0.4 ms
Lead Channel Pacing Threshold Pulse Width: 0.4 ms
Lead Channel Setting Pacing Amplitude: 3.5 V
Lead Channel Setting Pacing Amplitude: 3.5 V
Lead Channel Setting Pacing Pulse Width: 0.4 ms
Lead Channel Setting Sensing Sensitivity: 2.5 mV
Pulse Gen Serial Number: 973497

## 2020-11-27 NOTE — Progress Notes (Signed)
Remote pacemaker transmission.   

## 2020-12-03 ENCOUNTER — Ambulatory Visit
Admission: RE | Admit: 2020-12-03 | Discharge: 2020-12-03 | Disposition: A | Discharge status: Home / Self Care | Payer: PPO | Source: Ambulatory Visit

## 2020-12-03 ENCOUNTER — Other Ambulatory Visit: Payer: Self-pay

## 2020-12-03 DIAGNOSIS — Z1231 Encounter for screening mammogram for malignant neoplasm of breast: Secondary | ICD-10-CM

## 2020-12-17 ENCOUNTER — Other Ambulatory Visit: Payer: Self-pay | Admitting: *Deleted

## 2020-12-17 ENCOUNTER — Telehealth: Payer: Self-pay

## 2020-12-17 DIAGNOSIS — R0609 Other forms of dyspnea: Secondary | ICD-10-CM | POA: Diagnosis not present

## 2020-12-17 MED ORDER — DILTIAZEM HCL 30 MG PO TABS: 30.0000 mg | ORAL_TABLET | Freq: Three times a day (TID) | ORAL | 0 refills | Status: DC | PRN

## 2020-12-17 NOTE — Telephone Encounter (Signed)
Today's manual transmission reviewed. Presenting EGM AS/VS'@68'$ . Normal device function. 9 NSVT episodes noted 12/11/20 during time of symptoms. Appears AF with RVR. Burden <1%. Event lasted 1.2 hours. Patient states she had a prescription for Diltizam '30mg'$  prescribed 2019 by Roderic Palau, NP (AF clinic). Patient took 1 tab with relief of symptoms a short while after. This coincides with events recorded on device. Diltizam not listed on patient current medication profile. She continues to complain of mild lightheadedness. HR and BP reported WNL. Patient compliant with Eliquis. Will route to Dr. Sallyanne Kuster for additional recommendations if needed.

## 2020-12-17 NOTE — Telephone Encounter (Signed)
error 

## 2020-12-17 NOTE — Telephone Encounter (Signed)
Patient called in needing help to send a transmission since she is having sob and lightheaded. Patient states she has not been feeling good and states her heart rate has been going up quickly and thinks her device needs to be reprogrammed. She says the symptoms was 12/11/2020 and her bp was elevated that day and she felt she was going to pass out.

## 2021-01-21 DIAGNOSIS — Z23 Encounter for immunization: Secondary | ICD-10-CM | POA: Diagnosis not present

## 2021-01-21 DIAGNOSIS — K760 Fatty (change of) liver, not elsewhere classified: Secondary | ICD-10-CM | POA: Diagnosis not present

## 2021-01-21 DIAGNOSIS — I700 Atherosclerosis of aorta: Secondary | ICD-10-CM | POA: Diagnosis not present

## 2021-01-21 DIAGNOSIS — I2510 Atherosclerotic heart disease of native coronary artery without angina pectoris: Secondary | ICD-10-CM | POA: Diagnosis not present

## 2021-01-21 DIAGNOSIS — E890 Postprocedural hypothyroidism: Secondary | ICD-10-CM | POA: Diagnosis not present

## 2021-01-21 DIAGNOSIS — I10 Essential (primary) hypertension: Secondary | ICD-10-CM | POA: Diagnosis not present

## 2021-01-21 DIAGNOSIS — E785 Hyperlipidemia, unspecified: Secondary | ICD-10-CM | POA: Diagnosis not present

## 2021-01-21 DIAGNOSIS — Z7901 Long term (current) use of anticoagulants: Secondary | ICD-10-CM | POA: Diagnosis not present

## 2021-01-21 DIAGNOSIS — Z86711 Personal history of pulmonary embolism: Secondary | ICD-10-CM | POA: Diagnosis not present

## 2021-01-21 DIAGNOSIS — D126 Benign neoplasm of colon, unspecified: Secondary | ICD-10-CM | POA: Diagnosis not present

## 2021-01-21 DIAGNOSIS — I4891 Unspecified atrial fibrillation: Secondary | ICD-10-CM | POA: Diagnosis not present

## 2021-02-03 ENCOUNTER — Ambulatory Visit (INDEPENDENT_AMBULATORY_CARE_PROVIDER_SITE_OTHER): Payer: PPO

## 2021-02-03 DIAGNOSIS — I495 Sick sinus syndrome: Secondary | ICD-10-CM | POA: Diagnosis not present

## 2021-02-03 LAB — CUP PACEART REMOTE DEVICE CHECK
Battery Remaining Longevity: 150 mo
Battery Remaining Percentage: 100 %
Brady Statistic RA Percent Paced: 69 %
Brady Statistic RV Percent Paced: 0 %
Date Time Interrogation Session: 20221018020200
Implantable Lead Implant Date: 20220406
Implantable Lead Implant Date: 20220406
Implantable Lead Location: 753859
Implantable Lead Location: 753860
Implantable Lead Model: 7840
Implantable Lead Model: 7841
Implantable Lead Serial Number: 1037262
Implantable Lead Serial Number: 1127954
Implantable Pulse Generator Implant Date: 20220406
Lead Channel Impedance Value: 1014 Ohm
Lead Channel Impedance Value: 703 Ohm
Lead Channel Pacing Threshold Amplitude: 0.5 V
Lead Channel Pacing Threshold Amplitude: 0.7 V
Lead Channel Pacing Threshold Pulse Width: 0.4 ms
Lead Channel Pacing Threshold Pulse Width: 0.4 ms
Lead Channel Setting Pacing Amplitude: 3.5 V
Lead Channel Setting Pacing Amplitude: 3.5 V
Lead Channel Setting Pacing Pulse Width: 0.4 ms
Lead Channel Setting Sensing Sensitivity: 2.5 mV
Pulse Gen Serial Number: 973497

## 2021-02-09 DIAGNOSIS — Z950 Presence of cardiac pacemaker: Secondary | ICD-10-CM | POA: Diagnosis not present

## 2021-02-09 DIAGNOSIS — I10 Essential (primary) hypertension: Secondary | ICD-10-CM | POA: Diagnosis not present

## 2021-02-09 DIAGNOSIS — I700 Atherosclerosis of aorta: Secondary | ICD-10-CM | POA: Diagnosis not present

## 2021-02-09 DIAGNOSIS — F5101 Primary insomnia: Secondary | ICD-10-CM | POA: Diagnosis not present

## 2021-02-09 DIAGNOSIS — E7800 Pure hypercholesterolemia, unspecified: Secondary | ICD-10-CM | POA: Diagnosis not present

## 2021-02-09 DIAGNOSIS — D6869 Other thrombophilia: Secondary | ICD-10-CM | POA: Diagnosis not present

## 2021-02-12 ENCOUNTER — Other Ambulatory Visit (HOSPITAL_BASED_OUTPATIENT_CLINIC_OR_DEPARTMENT_OTHER): Payer: Self-pay | Admitting: Orthopaedic Surgery

## 2021-02-12 DIAGNOSIS — H524 Presbyopia: Secondary | ICD-10-CM | POA: Diagnosis not present

## 2021-02-12 DIAGNOSIS — Z961 Presence of intraocular lens: Secondary | ICD-10-CM | POA: Diagnosis not present

## 2021-02-12 DIAGNOSIS — H5212 Myopia, left eye: Secondary | ICD-10-CM | POA: Diagnosis not present

## 2021-02-12 DIAGNOSIS — M79672 Pain in left foot: Secondary | ICD-10-CM

## 2021-02-12 DIAGNOSIS — H5201 Hypermetropia, right eye: Secondary | ICD-10-CM | POA: Diagnosis not present

## 2021-02-12 DIAGNOSIS — H52202 Unspecified astigmatism, left eye: Secondary | ICD-10-CM | POA: Diagnosis not present

## 2021-02-12 NOTE — Progress Notes (Signed)
Remote pacemaker transmission.   

## 2021-02-13 ENCOUNTER — Ambulatory Visit (INDEPENDENT_AMBULATORY_CARE_PROVIDER_SITE_OTHER): Payer: PPO | Admitting: Orthopaedic Surgery

## 2021-02-13 ENCOUNTER — Other Ambulatory Visit: Payer: Self-pay

## 2021-02-13 ENCOUNTER — Ambulatory Visit (HOSPITAL_BASED_OUTPATIENT_CLINIC_OR_DEPARTMENT_OTHER)
Admission: RE | Admit: 2021-02-13 | Discharge: 2021-02-13 | Disposition: A | Discharge status: Home / Self Care | Payer: PPO | Source: Ambulatory Visit | Attending: Orthopaedic Surgery | Admitting: Orthopaedic Surgery

## 2021-02-13 DIAGNOSIS — M2141 Flat foot [pes planus] (acquired), right foot: Secondary | ICD-10-CM

## 2021-02-13 DIAGNOSIS — M79672 Pain in left foot: Secondary | ICD-10-CM | POA: Insufficient documentation

## 2021-02-13 DIAGNOSIS — M2142 Flat foot [pes planus] (acquired), left foot: Secondary | ICD-10-CM | POA: Diagnosis not present

## 2021-02-13 NOTE — Progress Notes (Signed)
Chief Complaint: left foot flat foot     History of Present Illness:    Michele Thompson is a 80 y.o. female with left foot pain now going on for several months although she is noticed mild deformity and pain for several years.  She was previously given custom orthotics which she said did not help whatsoever.  She has not tried any formal medial arch supports.  She does have gel inserts that she wears in both feet.  She has tried heat and ice for her bilateral flatfootedness.  She takes Tylenol for the predominantly medial based arch pain but this is only helpful somewhat.  She describes the pain as dull and achy.    Surgical History:   She has prior right total knee replacement.  PMH/PSH/Family History/Social History/Meds/Allergies:    Past Medical History:  Diagnosis Date   Anxiety    Aortic atherosclerosis ([place])    Arthritis    Constipation    Coronary artery disease 03/16/2016   a. admx with angina and mildly elevated Troponin >> LHC: mLAD 30 >> CCB started for poss microvascular angina   Depression    Diverticulosis    Edema of both lower extremities    Fatty liver    History of MI (myocardial infarction)    Hyperlipidemia 03/16/2016   Hypertension    Hypothyroidism    Joint pain    Pacemaker    Pacemaker    Paroxysmal A-fib ([place])    PE (pulmonary thromboembolism) ([place]) 03/17/2016   Bilateral submassive pulmonary embolus with RUE and RLE DVT on venous duplex 02/2016 c/b PEA arrest // Echo 03/21/16: mild LVH, EF 60-65, no RWMA, Gr 1 DD, normal RVSF   Psoas tendinitis    Sinus bradycardia 03/31/2016   Thrombocytopenia ([place]) 02/2016   Profound thrombocytopenia noted upon admission for pulmonary embolism >> initially thought to be from Hep induced thrombocytopenia given recent admit for cardiac cath and Heparin use // However, HIT Ab was neg (0.184) // Hematology consult pending   Thyroid disease    Unilateral primary osteoarthritis, right  knee    Vascular malformation    mid ascending colon   Vitamin D deficiency    Past Surgical History:  Procedure Laterality Date   CARDIAC CATHETERIZATION N/A 03/15/2016   Procedure: Left Heart Cath and Coronary Angiography;  Surgeon: [name], MD;  Location: [facility];  Service: Cardiovascular;  Laterality: N/A;   INSERT / REPLACE / REMOVE PACEMAKER     PACEMAKER IMPLANT N/A 07/21/2020   Procedure: PACEMAKER IMPLANT;  Surgeon: Sanda Klein, MD;  Location: [facility];  Service: Cardiovascular;  Laterality: N/A;   TOTAL KNEE ARTHROPLASTY Right 12/27/2016   Procedure: RIGHT TOTAL KNEE ARTHROPLASTY;  Surgeon: Gaynelle Arabian, MD;  Location: [facility];  Service: Orthopedics;  Laterality: Right;   VAGINAL HYSTERECTOMY  1983   endometriosis   Social History   Socioeconomic History   Marital status: Widowed    Spouse name: Not on file   Number of children: Not on file   Years of education: Not on file   Highest education level: Not on file  Occupational History   Occupation: retired  Tobacco Use   Smoking status: Never   Smokeless tobacco: Never  Vaping Use   Vaping Use: Never used  Substance and Sexual  Activity   Alcohol use: No    Alcohol/week: 0.0 standard drinks   Drug use: No   Sexual activity: Never    Birth control/protection: Surgical    Comment: 1st intercourse 80 yo-Fewer than 5 partners  Other Topics Concern   Not on file  Social History Narrative   [place] Pulmonary:   Originally from [place]. Moved to [place] in 2012. 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   Social Determinants of Health   Financial Resource Strain: Not on file  Food Insecurity: Not on file  Transportation Needs: Not on file  Physical Activity: Not on file  Stress: Not on file  Social Connections: Not on file   Family History  Problem Relation Age of Onset   Heart failure Mother    Heart disease Mother    Heart  failure Father    High blood pressure Father    Stroke Father    Heart disease Father    Heart failure Maternal Grandmother    Myasthenia gravis Brother    Stroke Brother    Clotting disorder Neg Hx    Colon cancer Neg Hx    No Known Allergies Current Outpatient Medications  Medication Sig Dispense Refill   ALPRAZolam (XANAX) 0.25 MG tablet Take 0.25 mg by mouth daily as needed.     apixaban (ELIQUIS) 5 MG TABS tablet Take 1 tablet (5 mg total) by mouth 2 (two) times daily. 180 tablet 1   Cholecalciferol (VITAMIN D3) 2000 units TABS Take 2,000 Units by mouth daily.     diltiazem (CARDIZEM) 30 MG tablet Take 1 tablet (30 mg total) by mouth 3 (three) times daily as needed (rapid palpitations). 30 tablet 0   gabapentin (NEURONTIN) 300 MG capsule Take 300 mg by mouth at bedtime.     hydrochlorothiazide (HYDRODIURIL) 12.5 MG tablet TAKE 1 TABLET EACH DAY. (Patient taking differently: Take 12.5 mg by mouth daily.) 90 tablet 3   levothyroxine (SYNTHROID) 75 MCG tablet Take 75 mcg by mouth daily before breakfast.     lisinopril (ZESTRIL) 40 MG tablet TAKE 1 TABLET EACH DAY. 90 tablet 3   rosuvastatin (CRESTOR) 20 MG tablet TAKE 1 TABLET ONCE DAILY. (Patient taking differently: Take 20 mg by mouth daily.) 90 tablet 3   No current facility-administered medications for this visit.   No results found.  Review of Systems:   A ROS was performed including pertinent positives and negatives as documented in the HPI.  Physical Exam :   Constitutional: NAD and appears stated age Neurological: Alert and oriented Psych: Appropriate affect and cooperative There were no vitals taken for this visit.   Comprehensive Musculoskeletal Exam:    She has bilateral pes planus deformity.  This appears to be mobile and not rigid.  She is not able to do a standing forward raise on the left foot.  She has tenderness about the left medial heel over the PT tendon.  Sensation is intact in all  distributions.  Imaging:   Xray (3 views left foot nonweightbearing) Pes planus deformity    I personally reviewed and interpreted the radiographs.   Assessment:   80 year old female with bilateral pes planus deformity which is mobile.  She has previously tried custom orthotics which did not help.  She has had a progressive dull achy pain over the medial posterior tibial tendon for the past several months.  This is quite bothersome to her.  This is aggravating her known left knee  osteoarthritis as well.  At this time she is further asking about additional treatments.  I described that I would recommend starting with a medial arch support in order to support her deformity.  That being said I do believe that she would potentially be a surgical candidate and I would like her to obtain a second opinion with Dr. Doran Durand  Plan :    -I have advised that she can obtain a PCSsole Orthotic Arch Support Shoe medial arch support offline  -I will also plan to make a referral for an opinion with Dr. Doran Durand MD at [facility]     I personally saw and evaluated the patient, and participated in the management and treatment plan.  Vanetta Mulders, MD Attending Physician, Orthopedic Surgery  This document was dictated using Dragon voice recognition software. A reasonable attempt at proof reading has been made to minimize errors.

## 2021-02-13 NOTE — Patient Instructions (Signed)
-  I have advised that she can obtain a Librarian, academic Arch Support Shoe medial arch support offline  -I will also plan to make a referral for an opinion with Dr. Doran Durand MD at [facility]

## 2021-02-15 ENCOUNTER — Encounter (HOSPITAL_BASED_OUTPATIENT_CLINIC_OR_DEPARTMENT_OTHER): Payer: Self-pay | Admitting: Orthopaedic Surgery

## 2021-02-18 ENCOUNTER — Telehealth: Payer: Self-pay

## 2021-02-18 NOTE — Telephone Encounter (Signed)
Unscheduled transmission, normal function Ongoing AF staring 11/1 @ 19:40 10 NSVT, irregular R-R, RVR Historically poor rate control with atrial arrhythmia Eliquis, Diltiazem prescribed Route for ongoing event, HR's  Attempted to contact patient to assess, check mediation compliance and send manual transmission.  No answer, LMTCB.

## 2021-02-19 DIAGNOSIS — L218 Other seborrheic dermatitis: Secondary | ICD-10-CM | POA: Diagnosis not present

## 2021-02-19 DIAGNOSIS — L710 Perioral dermatitis: Secondary | ICD-10-CM | POA: Diagnosis not present

## 2021-02-19 DIAGNOSIS — L910 Hypertrophic scar: Secondary | ICD-10-CM | POA: Diagnosis not present

## 2021-02-20 NOTE — Telephone Encounter (Signed)
Successful telephone call with patient to discuss symptoms with AF/RVR. Known history and + [place]. Patient states she had just returned for a walk and noticed her HR did not return to "normal". She feel palpitations in her throat but unsure if she should take a PRN Cardizem 30mg  as she thought her BP had to be "really high" and her pulse "really fast". Reviewed med order and no specific paramaters for administration noted. Patient encouraged to take cardizem as directed on bottle for palpitation onset that was uncomfortable. Patient also states she has anxiety during episodes secondary to being fearful of a heart attack or stroke. Discussed purpose of taking eliquis. Patient denies missed doses. Provided a significant amt of reassurance and education on causes of stroke and MI and preventative therapies. Patient appreciative of call. Manual transmission today indicates 02/17/21 episode lasted approx 1 hour.

## 2021-02-20 NOTE — Telephone Encounter (Signed)
The patient called wanting the nurse to look at the 02/17/2021 transmission. She wants to know if she did things correctly. She did not take her pill because she thought her blood pressure would drop too low. She wants more understanding on what to do during that time. I told her the nurse will be more than happy to answer her questions she just needs a transmission. I let her speak with Portia, rn.

## 2021-02-23 ENCOUNTER — Encounter: Payer: Self-pay | Admitting: Cardiovascular Disease

## 2021-02-23 ENCOUNTER — Ambulatory Visit (INDEPENDENT_AMBULATORY_CARE_PROVIDER_SITE_OTHER): Payer: PPO | Admitting: Cardiovascular Disease

## 2021-02-23 ENCOUNTER — Other Ambulatory Visit: Payer: Self-pay

## 2021-02-23 VITALS — BP 144/66 | HR 60 | Ht 62.0 in | Wt 191.8 lb

## 2021-02-23 DIAGNOSIS — I10 Essential (primary) hypertension: Secondary | ICD-10-CM

## 2021-02-23 DIAGNOSIS — I495 Sick sinus syndrome: Secondary | ICD-10-CM | POA: Diagnosis not present

## 2021-02-23 DIAGNOSIS — Z86711 Personal history of pulmonary embolism: Secondary | ICD-10-CM | POA: Diagnosis not present

## 2021-02-23 DIAGNOSIS — I480 Paroxysmal atrial fibrillation: Secondary | ICD-10-CM

## 2021-02-23 DIAGNOSIS — Z950 Presence of cardiac pacemaker: Secondary | ICD-10-CM | POA: Diagnosis not present

## 2021-02-23 DIAGNOSIS — I25111 Atherosclerotic heart disease of native coronary artery with angina pectoris with documented spasm: Secondary | ICD-10-CM

## 2021-02-23 DIAGNOSIS — Z7901 Long term (current) use of anticoagulants: Secondary | ICD-10-CM

## 2021-02-23 DIAGNOSIS — E7800 Pure hypercholesterolemia, unspecified: Secondary | ICD-10-CM | POA: Diagnosis not present

## 2021-02-23 MED ORDER — HYDROCHLOROTHIAZIDE 12.5 MG PO TABS: 12.5000 mg | ORAL_TABLET | Freq: Every day | ORAL | 3 refills | Status: DC

## 2021-02-23 MED ORDER — ROSUVASTATIN CALCIUM 20 MG PO TABS: 20.0000 mg | ORAL_TABLET | Freq: Every day | ORAL | 3 refills | Status: DC

## 2021-02-23 NOTE — Progress Notes (Signed)
.  Cardiology Office Note:    Date:  02/23/2021   ID:  Michele Thompson, DOB 03-Mar-1941, MRN [id number]  PCP:  Kathyrn Lass, MD  Cardiologist:  Sanda Klein, MD   Referring MD: Kathyrn Lass, MD   No chief complaint on file.    History of Present Illness:    Michele Thompson is a 80 y.o. female with a hx of massive pulmonary embolism, complicated by cardiorespiratory arrest, while on estrogen therapy in 2017.  She had an episode of paroxysmal atrial fibrillation and underwent cardioversion in August 2019.  She also has minor coronary artery disease and suspicion for coronary vasospasm on previous angiography, hypertension and hyperlipidemia.  Previous Holter monitor showed evidence of sinus node dysfunction and chronotropic incompetence while taking beta-blockers.   She has normal left ventricular systolic function on echo and had normal pulmonary function test in 2018.  She underwent implantation of a dual-chamber permanent pacemaker in April 2022 ([place]).  We have been adjusting her rate response sensor and she feels improved.  Hard to say whether this was simply due to pacemaker device setting changes, since she also required adjustment in her levothyroxine supplement.  She has more energy.  She has not had any angina, orthopnea, PND, lower extremity edema, neurological events or syncope.  She had 1 episode of sustained palpitations lasting for about an hour on November 1, improved after sleeping.  She did not take the "as needed" diltiazem since she thought this was indicated for high blood pressure.  Her heart rate was in the 166 bpm range.  This was the first meaningful episode of atrial arrhythmia since another 1 hour episode that occurred on August 25.  Pacemaker interrogation shows normal device function.  Anticipated longevity is 12 years.  She has 67% atrial pacing and less than 1% ventricular pacing.  Lead parameters are excellent.  She has a little bit of keloid formation and  itching at the site, but the scar appears very well-healed and there is no evidence of pocket swelling or redness.  Recently performed lipid profile showed excellent parameters with LDL of 60 and HDL 54.  Past Medical History:  Diagnosis Date   Anxiety    Aortic atherosclerosis (HCC)    Arthritis    Constipation    Coronary artery disease 03/16/2016   a. admx with angina and mildly elevated Troponin >> LHC: mLAD 30 >> CCB started for poss microvascular angina   Depression    Diverticulosis    Edema of both lower extremities    Fatty liver    History of MI (myocardial infarction)    Hyperlipidemia 03/16/2016   Hypertension    Hypothyroidism    Joint pain    Pacemaker    Pacemaker    Paroxysmal A-fib ([place])    PE (pulmonary thromboembolism) ([place]) 03/17/2016   Bilateral submassive pulmonary embolus with RUE and RLE DVT on venous duplex 02/2016 c/b PEA arrest // Echo 03/21/16: mild LVH, EF 60-65, no RWMA, Gr 1 DD, normal RVSF   Psoas tendinitis    Sinus bradycardia 03/31/2016   Thrombocytopenia ([place]) 02/2016   Profound thrombocytopenia noted upon admission for pulmonary embolism >> initially thought to be from Hep induced thrombocytopenia given recent admit for cardiac cath and Heparin use // However, HIT Ab was neg (0.184) // Hematology consult pending   Thyroid disease    Unilateral primary osteoarthritis, right knee    Vascular malformation    mid ascending colon   Vitamin D  deficiency     Past Surgical History:  Procedure Laterality Date   CARDIAC CATHETERIZATION N/A 03/15/2016   Procedure: Left Heart Cath and Coronary Angiography;  Surgeon: [name], MD;  Location: [facility];  Service: Cardiovascular;  Laterality: N/A;   INSERT / REPLACE / REMOVE PACEMAKER     PACEMAKER IMPLANT N/A 07/21/2020   Procedure: PACEMAKER IMPLANT;  Surgeon: Sanda Klein, MD;  Location: [facility];  Service: Cardiovascular;  Laterality: N/A;   TOTAL KNEE ARTHROPLASTY Right  12/27/2016   Procedure: RIGHT TOTAL KNEE ARTHROPLASTY;  Surgeon: Gaynelle Arabian, MD;  Location: [facility];  Service: Orthopedics;  Laterality: Right;   VAGINAL HYSTERECTOMY  1983   endometriosis    Current Medications: Current Meds  Medication Sig   apixaban (ELIQUIS) 5 MG TABS tablet Take 1 tablet (5 mg total) by mouth 2 (two) times daily.   Cholecalciferol (VITAMIN D3) 2000 units TABS Take 2,000 Units by mouth daily.   clindamycin (CLEOCIN T) 1 % lotion Apply topically 2 (two) times daily.   clobetasol (TEMOVATE) 0.05 % external solution SMARTSIG:Sparingly Topical 1-2 Times Daily   gabapentin (NEURONTIN) 300 MG capsule Take 300 mg by mouth at bedtime.   hydrochlorothiazide (HYDRODIURIL) 12.5 MG tablet TAKE 1 TABLET EACH DAY. (Patient taking differently: Take 12.5 mg by mouth daily.)   ketoconazole (NIZORAL) 2 % shampoo Apply topically.   levothyroxine (SYNTHROID) 100 MCG tablet Take 100 mcg by mouth every morning.   lisinopril (ZESTRIL) 40 MG tablet TAKE 1 TABLET EACH DAY.   rosuvastatin (CRESTOR) 20 MG tablet TAKE 1 TABLET ONCE DAILY. (Patient taking differently: Take 20 mg by mouth daily.)   Scar Treatment Products (STRATA TRIZ) GEL Apply topically 2 (two) times daily. to affected area(s)     Allergies:   Patient has no known allergies.   Social History   Socioeconomic History   Marital status: Widowed    Spouse name: Not on file   Number of children: Not on file   Years of education: Not on file   Highest education level: Not on file  Occupational History   Occupation: retired  Tobacco Use   Smoking status: Never   Smokeless tobacco: Never  Vaping Use   Vaping Use: Never used  Substance and Sexual Activity   Alcohol use: No    Alcohol/week: 0.0 standard drinks   Drug use: No   Sexual activity: Never    Birth control/protection: Surgical    Comment: 1st intercourse 80 yo-Fewer than 5 partners  Other Topics Concern   Not on file  Social History Narrative   [place]  Pulmonary:   Originally from [place]. Moved to [place] in 2012. 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   Social Determinants of Health   Financial Resource Strain: Not on file  Food Insecurity: Not on file  Transportation Needs: Not on file  Physical Activity: Not on file  Stress: Not on file  Social Connections: Not on file     Family History: The patient's family history includes Heart disease in her father and mother; Heart failure in her father, maternal grandmother, and mother; High blood pressure in her father; Myasthenia gravis in her brother; Stroke in her brother and father. There is no history of Clotting disorder or Colon cancer.  ROS:   Please see the history of present illness.    All other systems are reviewed and are negative.  EKGs/Labs/Other Studies Reviewed:    The following  studies were reviewed today: Comprehensive in office pacemaker check and device reprogramming  EKG: Is ordered today and shows sinus rhythm, normal tracing Recent Labs: 08/12/2020: BUN 19; Creatinine, Ser 0.72; Hemoglobin 12.7; Platelets 159; Potassium 3.9; Sodium 138  Recent Lipid Panel    Component Value Date/Time   CHOL 113 02/26/2019 0858   TRIG 60 02/26/2019 0858   HDL 56 02/26/2019 0858   CHOLHDL 3.2 03/15/2016 0524   VLDL 15 03/15/2016 0524   LDLCALC 44 02/26/2019 0858   01/21/2021 Cholesterol 127, HDL 54, LDL 60, triglycerides 64, potassium 4.5, ALT 11, TSH 10.36  Physical Exam:    VS:  BP (!) 144/66 (BP Location: Left Arm, Patient Position: Sitting, Cuff Size: Large)   Pulse 60   Ht 5\' 2"  (1.575 m)   Wt 191 lb 12.8 oz (87 kg)   SpO2 96%   BMI 35.08 kg/m     Wt Readings from Last 3 Encounters:  02/23/21 191 lb 12.8 oz (87 kg)  08/12/20 180 lb (81.6 kg)  07/22/20 184 lb 8 oz (83.7 kg)      General: Alert, oriented x3, no distress, healthy left subclavian pacemaker site with some slight keloid formation Head:  no evidence of trauma, PERRL, EOMI, no exophtalmos or lid lag, no myxedema, no xanthelasma; normal ears, nose and oropharynx Neck: normal jugular venous pulsations and no hepatojugular reflux; brisk carotid pulses without delay and no carotid bruits Chest: clear to auscultation, no signs of consolidation by percussion or palpation, normal fremitus, symmetrical and full respiratory excursions Cardiovascular: normal position and quality of the apical impulse, regular rhythm, normal first and second heart sounds, no murmurs, rubs or gallops Abdomen: no tenderness or distention, no masses by palpation, no abnormal pulsatility or arterial bruits, normal bowel sounds, no hepatosplenomegaly Extremities: no clubbing, cyanosis or edema; 2+ radial, ulnar and brachial pulses bilaterally; 2+ right femoral, posterior tibial and dorsalis pedis pulses; 2+ left femoral, posterior tibial and dorsalis pedis pulses; no subclavian or femoral bruits Neurological: grossly nonfocal Psych: Normal mood and affect    ASSESSMENT:    1. Paroxysmal atrial fibrillation (HCC)   2. SSS (sick sinus syndrome) ([place])   3. Pacemaker   4. Coronary artery disease involving native coronary artery of native heart with angina pectoris with documented spasm ([place])   5. History of pulmonary embolism   6. Long term current use of anticoagulant   7. Essential hypertension   8. Hypercholesterolemia     PLAN:    In order of problems listed above:  PAFib: Overall burden of arrhythmia is low, under 1%.  Episodes are self-limited and so far have been no more than about an hour in duration.  Arrhythmias however is symptomatic and asked her to take diltiazem whenever she has rapid palpitations.  CHA2DS2-Vasc score 5 (age, CAD, HTN, female).  Option to start beta-blockers since she now has a pacemaker and we could compensate for chronotropic incompetence with her device.  Other, more potent antiarrhythmics do not appear indicated at this time.    SSS: Improved symptoms of fatigue after adjustment of her sensor settings, also adjustment of her levothyroxine dose Pacemaker: Well-healed.  Normal lead and generator parameters.  Rate response sensor adjustments made today. CAD: Asymptomatic with only mild disease on previous cardiac catheterization.  There has been a suspicion of coronary vasospasm but she is currently angina free.  Lipid profile is excellent. Hx of PE: Plan lifelong anticoagulation for her atrial fibrillation anyway.  May have some degree of residual  exertional dyspnea from her massive pulmonary embolism. Anticoagulation: No bleeding, injuries or falls.  On Eliquis. HTN: Acceptable control. HLP: All lipid parameters in target range.   Patient Instructions  Medication Instructions:  No changes *If you need a refill on your cardiac medications before your next appointment, please call your pharmacy*   Lab Work: None ordered If you have labs (blood work) drawn today and your tests are completely normal, you will receive your results only by: [place] (if you have MyChart) OR A paper copy in the mail If you have any lab test that is abnormal or we need to change your treatment, we will call you to review the results.   Testing/Procedures: None ordered   Follow-Up: At [facility], you and your health needs are our priority.  As part of our continuing mission to provide you with exceptional heart care, we have created designated Provider Care Teams.  These Care Teams include your primary Cardiologist (physician) and Advanced Practice Providers (APPs -  Physician Assistants and Nurse Practitioners) who all work together to provide you with the care you need, when you need it.  We recommend signing up for the patient portal called "MyChart".  Sign up information is provided on this After Visit Summary.  MyChart is used to connect with patients for Virtual Visits (Telemedicine).  Patients are able to view  lab/test results, encounter notes, upcoming appointments, etc.  Non-urgent messages can be sent to your provider as well.   To learn more about what you can do with MyChart, go to [email or web address].    Your next appointment:   12 month(s)  The format for your next appointment:   In Person  Provider:   Sanda Klein, MD       Medication Adjustments/Labs and Tests Ordered: Current medicines are reviewed at length with the patient today.  Concerns regarding medicines are outlined above.  Orders Placed This Encounter  Procedures   EKG 12-Lead    No orders of the defined types were placed in this encounter.   Signed, Sanda Klein, MD  02/23/2021 5:01 PM    [facility]

## 2021-02-23 NOTE — Patient Instructions (Signed)

## 2021-03-24 DIAGNOSIS — E785 Hyperlipidemia, unspecified: Secondary | ICD-10-CM | POA: Diagnosis not present

## 2021-03-24 DIAGNOSIS — I10 Essential (primary) hypertension: Secondary | ICD-10-CM | POA: Diagnosis not present

## 2021-03-25 DIAGNOSIS — M6702 Short Achilles tendon (acquired), left ankle: Secondary | ICD-10-CM | POA: Diagnosis not present

## 2021-03-25 DIAGNOSIS — M76822 Posterior tibial tendinitis, left leg: Secondary | ICD-10-CM | POA: Diagnosis not present

## 2021-04-07 ENCOUNTER — Encounter: Payer: Self-pay | Admitting: Cardiovascular Disease

## 2021-04-27 ENCOUNTER — Encounter: Payer: PPO | Admitting: Orthopedic Surgery

## 2021-05-05 ENCOUNTER — Ambulatory Visit (INDEPENDENT_AMBULATORY_CARE_PROVIDER_SITE_OTHER): Payer: PPO

## 2021-05-05 DIAGNOSIS — I495 Sick sinus syndrome: Secondary | ICD-10-CM | POA: Diagnosis not present

## 2021-05-05 DIAGNOSIS — L718 Other rosacea: Secondary | ICD-10-CM | POA: Diagnosis not present

## 2021-05-05 DIAGNOSIS — L910 Hypertrophic scar: Secondary | ICD-10-CM | POA: Diagnosis not present

## 2021-05-05 DIAGNOSIS — L218 Other seborrheic dermatitis: Secondary | ICD-10-CM | POA: Diagnosis not present

## 2021-05-05 LAB — CUP PACEART REMOTE DEVICE CHECK
Battery Remaining Longevity: 150 mo
Battery Remaining Percentage: 100 %
Brady Statistic RA Percent Paced: 62 %
Brady Statistic RV Percent Paced: 0 %
Date Time Interrogation Session: 20230117020100
Implantable Lead Implant Date: 20220406
Implantable Lead Implant Date: 20220406
Implantable Lead Location: 753859
Implantable Lead Location: 753860
Implantable Lead Model: 7840
Implantable Lead Model: 7841
Implantable Lead Serial Number: 1037262
Implantable Lead Serial Number: 1127954
Implantable Pulse Generator Implant Date: 20220406
Lead Channel Impedance Value: 719 Ohm
Lead Channel Impedance Value: 912 Ohm
Lead Channel Pacing Threshold Amplitude: 0.6 V
Lead Channel Pacing Threshold Amplitude: 0.6 V
Lead Channel Pacing Threshold Pulse Width: 0.4 ms
Lead Channel Pacing Threshold Pulse Width: 0.4 ms
Lead Channel Setting Pacing Amplitude: 3.5 V
Lead Channel Setting Pacing Amplitude: 3.5 V
Lead Channel Setting Pacing Pulse Width: 0.4 ms
Lead Channel Setting Sensing Sensitivity: 2.5 mV
Pulse Gen Serial Number: 973497

## 2021-05-19 NOTE — Progress Notes (Signed)
Remote pacemaker transmission.   

## 2021-05-27 ENCOUNTER — Other Ambulatory Visit: Payer: Self-pay | Admitting: Cardiovascular Disease

## 2021-05-27 NOTE — Telephone Encounter (Signed)
Prescription refill request for Eliquis received. Indication:Afib Last office visit:11/22 Scr:0.7 Age: 81 Weight:87 kg  Prescription refilled

## 2021-06-28 ENCOUNTER — Encounter (HOSPITAL_BASED_OUTPATIENT_CLINIC_OR_DEPARTMENT_OTHER): Payer: Self-pay | Admitting: Obstetrics and Gynecology

## 2021-06-28 ENCOUNTER — Other Ambulatory Visit: Payer: Self-pay

## 2021-06-28 ENCOUNTER — Observation Stay (HOSPITAL_BASED_OUTPATIENT_CLINIC_OR_DEPARTMENT_OTHER)
Admission: EM | Admit: 2021-06-28 | Discharge: 2021-06-29 | Disposition: A | Discharge status: Home / Self Care | Payer: PPO | Attending: Internal Medicine | Admitting: Internal Medicine

## 2021-06-28 ENCOUNTER — Emergency Department (HOSPITAL_BASED_OUTPATIENT_CLINIC_OR_DEPARTMENT_OTHER): Payer: PPO

## 2021-06-28 DIAGNOSIS — Z20822 Contact with and (suspected) exposure to covid-19: Secondary | ICD-10-CM | POA: Diagnosis not present

## 2021-06-28 DIAGNOSIS — I480 Paroxysmal atrial fibrillation: Secondary | ICD-10-CM | POA: Diagnosis not present

## 2021-06-28 DIAGNOSIS — R41841 Cognitive communication deficit: Secondary | ICD-10-CM | POA: Insufficient documentation

## 2021-06-28 DIAGNOSIS — Z96651 Presence of right artificial knee joint: Secondary | ICD-10-CM | POA: Diagnosis not present

## 2021-06-28 DIAGNOSIS — G459 Transient cerebral ischemic attack, unspecified: Secondary | ICD-10-CM

## 2021-06-28 DIAGNOSIS — I674 Hypertensive encephalopathy: Principal | ICD-10-CM | POA: Insufficient documentation

## 2021-06-28 DIAGNOSIS — Z7901 Long term (current) use of anticoagulants: Secondary | ICD-10-CM | POA: Insufficient documentation

## 2021-06-28 DIAGNOSIS — I495 Sick sinus syndrome: Secondary | ICD-10-CM | POA: Insufficient documentation

## 2021-06-28 DIAGNOSIS — I10 Essential (primary) hypertension: Secondary | ICD-10-CM | POA: Diagnosis not present

## 2021-06-28 DIAGNOSIS — E039 Hypothyroidism, unspecified: Secondary | ICD-10-CM | POA: Diagnosis present

## 2021-06-28 DIAGNOSIS — Z950 Presence of cardiac pacemaker: Secondary | ICD-10-CM | POA: Diagnosis not present

## 2021-06-28 DIAGNOSIS — I2510 Atherosclerotic heart disease of native coronary artery without angina pectoris: Secondary | ICD-10-CM | POA: Insufficient documentation

## 2021-06-28 DIAGNOSIS — Z79899 Other long term (current) drug therapy: Secondary | ICD-10-CM | POA: Insufficient documentation

## 2021-06-28 DIAGNOSIS — H538 Other visual disturbances: Secondary | ICD-10-CM | POA: Diagnosis present

## 2021-06-28 DIAGNOSIS — R001 Bradycardia, unspecified: Secondary | ICD-10-CM | POA: Diagnosis not present

## 2021-06-28 LAB — COMPREHENSIVE METABOLIC PANEL
ALT: 7 U/L (ref 0–44)
AST: 19 U/L (ref 15–41)
Albumin: 4.2 g/dL (ref 3.5–5.0)
Alkaline Phosphatase: 51 U/L (ref 38–126)
Anion gap: 7 (ref 5–15)
BUN: 28 mg/dL — ABNORMAL HIGH (ref 8–23)
CO2: 28 mmol/L (ref 22–32)
Calcium: 9.5 mg/dL (ref 8.9–10.3)
Chloride: 103 mmol/L (ref 98–111)
Creatinine, Ser: 0.83 mg/dL (ref 0.44–1.00)
GFR, Estimated: >60 mL/min (ref >60)
Glucose, Bld: 114 mg/dL — ABNORMAL HIGH (ref 70–99)
Potassium: 3.8 mmol/L (ref 3.5–5.1)
Sodium: 138 mmol/L (ref 135–145)
Total Bilirubin: 0.3 mg/dL (ref 0.3–1.2)
Total Protein: 7.1 g/dL (ref 6.5–8.1)

## 2021-06-28 LAB — RESP PANEL BY RT-PCR (FLU A&B, COVID) ARPGX2
Influenza A by PCR: NEGATIVE
Influenza B by PCR: NEGATIVE
SARS Coronavirus 2 by RT PCR: NEGATIVE

## 2021-06-28 LAB — DIFFERENTIAL
Abs Immature Granulocytes: 0.01 10*3/uL (ref 0.00–0.07)
Basophils Absolute: 0.1 10*3/uL (ref 0.0–0.1)
Basophils Relative: 1 %
Eosinophils Absolute: 0.1 10*3/uL (ref 0.0–0.5)
Eosinophils Relative: 2 %
Immature Granulocytes: 0 %
Lymphocytes Relative: 33 %
Lymphs Abs: 1.8 10*3/uL (ref 0.7–4.0)
Monocytes Absolute: 0.4 10*3/uL (ref 0.1–1.0)
Monocytes Relative: 8 %
Neutro Abs: 3.1 10*3/uL (ref 1.7–7.7)
Neutrophils Relative %: 56 %

## 2021-06-28 LAB — CBC
HCT: 38.7 % (ref 36.0–46.0)
Hemoglobin: 12.8 g/dL (ref 12.0–15.0)
MCH: 31.2 pg (ref 26.0–34.0)
MCHC: 33.1 g/dL (ref 30.0–36.0)
MCV: 94.4 fL (ref 80.0–100.0)
Platelets: 159 10*3/uL (ref 150–400)
RBC: 4.1 MIL/uL (ref 3.87–5.11)
RDW: 12.2 % (ref 11.5–15.5)
WBC: 5.5 10*3/uL (ref 4.0–10.5)
nRBC: 0 % (ref 0.0–0.2)

## 2021-06-28 LAB — PROTIME-INR
INR: 1 (ref 0.8–1.2)
Prothrombin Time: 12.8 seconds (ref 11.4–15.2)

## 2021-06-28 LAB — APTT: aPTT: 27 seconds (ref 24–36)

## 2021-06-28 LAB — CBG MONITORING, ED: Glucose-Capillary: 103 mg/dL — ABNORMAL HIGH (ref 70–99)

## 2021-06-28 MED ORDER — ACETAMINOPHEN 500 MG PO TABS
1000.0000 mg | ORAL_TABLET | Freq: Once | ORAL | Status: AC
  Administered 2021-06-28: 1000 mg via ORAL
  Filled 2021-06-28: qty 2

## 2021-06-28 MED ORDER — SODIUM CHLORIDE 0.9% FLUSH
3.0000 mL | Freq: Once | INTRAVENOUS | Status: DC
  Filled 2021-06-28: qty 3

## 2021-06-28 NOTE — ED Triage Notes (Addendum)
Patient reports to the ER for loss of speech x1 hour ago. Reports symptoms have resolved but is still having some problems getting her words out. Patient reports she feels as if she is having trouble responding to questions.  ?

## 2021-06-28 NOTE — ED Provider Notes (Signed)
[facility] EMERGENCY DEPT Provider Note   CSN: [id number] Arrival date & time: 06/28/21  1816  An emergency department physician performed an initial assessment on this suspected stroke patient at 1849 (Pt is at normal baseline at present. Dr Johnney Killian gave verbal order to cancel code stroke.).  History  Chief Complaint  Patient presents with   Neurologic Problem    Michele Thompson is a 81 y.o. female.  HPI Patient reports at 3 PM she noticed a subtle visual change.  She reports it was a combination of slightly blurred vision and seeming like her vision was a little bit "shaky" on the right side.  Subsequently at 4 PM she was on the phone with her son and had very abrupt onset of loss of word finding ability.  She reports that she knew what she wanted to say but she could not generate the words.  She did not have other focal deficits such as focal weakness numbness or tingling of any extremity.  She reports she did feel kind of lightheaded but was able to ambulate to ask for assistance with some additional balance assist by holding onto a railing.  Patient lives at assisted living and was able to go ask for assistance.  Symptoms are much improved now.  She reports she can get her words out.  She reports she still feels a little bit shaky or lightheaded but does not have any focal weakness numbness or tingling.  She feels that she can generate speech normally.  She reports that what she sounded like when she was having the problem was like her brother who had stroke.  Patient reports she had a very similar episode April 2022 and was subsequently discharged home without a specific diagnosis once symptoms had resolved.    Home Medications Prior to Admission medications   Medication Sig Start Date End Date Taking? Authorizing Provider  Cholecalciferol (VITAMIN D3) 2000 units TABS Take 2,000 Units by mouth daily.    [provider]  clindamycin (CLEOCIN T) 1 % lotion Apply  topically 2 (two) times daily. 02/19/21   [provider]  clobetasol (TEMOVATE) 0.05 % external solution SMARTSIG:Sparingly Topical 1-2 Times Daily 02/19/21   [provider]  diltiazem (CARDIZEM) 30 MG tablet Take 1 tablet (30 mg total) by mouth 3 (three) times daily as needed (rapid palpitations). Patient not taking: Reported on 02/23/2021 12/17/20   Croitoru, Mihai, MD  ELIQUIS 5 MG TABS tablet TAKE ONE TABLET BY MOUTH TWICE DAILY 05/27/21   Croitoru, Mihai, MD  gabapentin (NEURONTIN) 300 MG capsule Take 300 mg by mouth at bedtime. 06/20/17   [provider]  hydrochlorothiazide (HYDRODIURIL) 12.5 MG tablet Take 1 tablet (12.5 mg total) by mouth daily. TAKE 1 TABLET EACH DAY. 02/23/21   Croitoru, Mihai, MD  ketoconazole (NIZORAL) 2 % shampoo Apply topically. 02/19/21   [provider]  levothyroxine (SYNTHROID) 100 MCG tablet Take 100 mcg by mouth every morning. 01/27/21   [provider]  lisinopril (ZESTRIL) 40 MG tablet TAKE 1 TABLET EACH DAY. 07/04/20   Croitoru, Mihai, MD  rosuvastatin (CRESTOR) 20 MG tablet Take 1 tablet (20 mg total) by mouth daily. 02/23/21   Croitoru, Mihai, MD  Scar Treatment Products (STRATA TRIZ) GEL Apply topically 2 (two) times daily. to affected area(s) 02/20/21   [provider]      Allergies    Patient has no known allergies.    Review of Systems   Review of Systems 10 systems reviewed  and negative except as per HPI Physical Exam Updated Vital Signs BP (!) 142/72    Pulse 60    Temp 98.1 F (36.7 C)    Resp 12    Ht 5' 2.5" (1.588 m)    Wt 81.6 kg    SpO2 100%    BMI 32.40 kg/m  Physical Exam Constitutional:      Comments: Alert nontoxic clinically well in appearance.  No respiratory distress.  Oriented x3.  HENT:     Nose: Nose normal.     Mouth/Throat:     Mouth: Mucous membranes are moist.     Pharynx: Oropharynx is clear.  Eyes:     Extraocular Movements: Extraocular movements intact.      Conjunctiva/sclera: Conjunctivae normal.     Pupils: Pupils are equal, round, and reactive to light.  Cardiovascular:     Rate and Rhythm: Normal rate.  Pulmonary:     Effort: Pulmonary effort is normal.     Breath sounds: Normal breath sounds.  Abdominal:     General: There is no distension.     Palpations: Abdomen is soft.     Tenderness: There is no abdominal tenderness. There is no guarding.  Musculoskeletal:        General: No swelling or tenderness.     Right lower leg: No edema.     Left lower leg: No edema.  Skin:    General: Skin is warm and dry.  Neurological:     General: No focal deficit present.     Mental Status: She is oriented to person, place, and time.     Motor: No weakness.     Coordination: Coordination normal.     Comments: Alert and oriented x3.  Follows commands appropriately.  Answers questions appropriately.  Identifies objects appropriately.  No evidence of aphasia.  Motor strength 5\5 upper and lower extremities.  Sensation intact to light touch x4 extremities.  No pronator drift.  Extraocular motions and tach.  No visual field deficits.    ED Results / Procedures / Treatments   Labs (all labs ordered are listed, but only abnormal results are displayed) Labs Reviewed  COMPREHENSIVE METABOLIC PANEL - Abnormal; Notable for the following components:      Result Value   Glucose, Bld 114 (*)    BUN 28 (*)    All other components within normal limits  CBG MONITORING, ED - Abnormal; Notable for the following components:   Glucose-Capillary 103 (*)    All other components within normal limits  PROTIME-INR  APTT  CBC  DIFFERENTIAL    EKG None  Radiology CT HEAD WO CONTRAST (5MM)  Result Date: 06/28/2021 CLINICAL DATA:  Transient ischemic attack. Loss of speech 1 hour ago. EXAM: CT HEAD WITHOUT CONTRAST TECHNIQUE: Contiguous axial images were obtained from the base of the skull through the vertex without intravenous contrast. RADIATION DOSE  REDUCTION: This exam was performed according to the departmental dose-optimization program which includes automated exposure control, adjustment of the mA and/or kV according to patient size and/or use of iterative reconstruction technique. COMPARISON:  None. FINDINGS: Brain: No evidence of acute infarction, hemorrhage, hydrocephalus, extra-axial collection or mass lesion/mass effect. Patchy low-attenuation changes in the deep white matter likely due to small vessel ischemia. Old lacunar infarct in the right basal ganglia. Vascular: Moderate intracranial arterial vascular calcifications. Skull: Normal. Negative for fracture or focal lesion. Sinuses/Orbits: Paranasal sinuses and mastoid air cells are clear. Other: None. IMPRESSION: No acute intracranial abnormalities. Mild chronic  small vessel ischemic changes. Electronically Signed   By: Lucienne Capers M.D.   On: 06/28/2021 19:56    Procedures Procedures    Medications Ordered in ED Medications - No data to display   ED Course/ Medical Decision Making/ A&P                           Medical Decision Making Amount and/or Complexity of Data Reviewed Labs: ordered. Radiology: ordered.  Risk OTC drugs. Decision regarding hospitalization.  Patient presents with symptoms very suggestive of TIA.  By the time of arrival to the emergency department symptoms had resolved.  Patient is speaking normally and answering questions.  She does not have focal deficit.  She has some vague perception of lightheadedness but is not vertiginous in nature.  Code stroke is not initiated as patient is now without objective stroke symptoms.  Will initiate stroke work-up.  Diagnostic evaluation does not show acute abnormalities.  CT head without acute findings.  Patient has remained asymptomatic since arrival.  Patient does report that the symptoms were much worse this time as opposed to April 2022 when she was evaluated for a similar episode with visual change and  speech change.  Plan for admission for TIA\stroke work-up.  Consult: Reviewed with Dr. Marlyce Huge [facility] hospitalist for admission.  Patient's last Eliquis dose was not established at the time of first evaluation.  I had nurse inquire as to patient's last Eliquis dose.  Patient reportedly took her own Eliquis which she had in her purse at 8 PM while in the emergency department without first notifying staff.  Recheck 23: 45 patient is alert speaking clearly no distress.  Stable for transfer.  She is updated about the plan for admission and further stroke work-up.        Final Clinical Impression(s) / ED Diagnoses Final diagnoses:  TIA (transient ischemic attack)    Rx / DC Orders ED Discharge Orders     None         Charlesetta Shanks, MD 06/28/21 2358

## 2021-06-28 NOTE — Progress Notes (Addendum)
Plan of Care Note for accepted transfer ? ? ?Patient: SONA NATIONS MRN: [id number]   DOA: 06/28/2021 ? ?Facility requesting transfer: [facility] ?Requesting Provider: Dr. Johnney Killian ?Reason for transfer: TIA ?Facility course:  ? ?81 year old female with past medical history of Tachybrady syndrome status post pacemaker placement 07/2020,  Paroxysmal atrial fibrillation, previous submassive pulmonary embolism and cardiopulmonary arrest (2017), coronary artery disease (cath 02/2016), hypertension, hyperlipidemia and hypothyroidism, presents to [facility] with complaints of new onset visual changes and difficulty with speech. ? ?ER provider reports that at approximately 3 PM patient began to experience sudden onset blurry vision.  Approximately 1 hour later she was on the phone with her son when she suddenly began to experience difficulty with word finding.  Patient denies associated focal weakness.  Patient eventually was brought into [facility] for evaluation however upon arrival neurologic symptoms are beginning to resolve. ? ?Noncontrast CT imaging of the brain unremarkable.  Due to patient's numerous risk factors and concern for TIA ER providers requesting hospitalization for work-up.  Please note that patient does have a pacemaker ([place] which should be MRI conditional).  Neurology will need to be contacted on arrival for consultation. ? ?Plan of care: ?The patient is accepted for admission to Telemetry unit, at [facility]..  ? ? ?Author: ?Vernelle Emerald, MD ?06/28/2021 ? ?Check [email or web address] for on-call coverage. ? ?Nursing staff, Please call [place] number on Amion as soon as patient's arrival, so appropriate admitting provider can evaluate the pt. ?

## 2021-06-28 NOTE — ED Notes (Signed)
[facility] arrived to transport pt. Pt stable at time of departure ?

## 2021-06-28 NOTE — ED Notes (Signed)
Pt placed on portable monitor and transported to CT by this RN ?

## 2021-06-29 ENCOUNTER — Observation Stay (HOSPITAL_BASED_OUTPATIENT_CLINIC_OR_DEPARTMENT_OTHER): Payer: PPO

## 2021-06-29 ENCOUNTER — Observation Stay (HOSPITAL_COMMUNITY): Payer: PPO

## 2021-06-29 DIAGNOSIS — Z7901 Long term (current) use of anticoagulants: Secondary | ICD-10-CM

## 2021-06-29 DIAGNOSIS — Q283 Other malformations of cerebral vessels: Secondary | ICD-10-CM | POA: Diagnosis not present

## 2021-06-29 DIAGNOSIS — I674 Hypertensive encephalopathy: Secondary | ICD-10-CM | POA: Insufficient documentation

## 2021-06-29 DIAGNOSIS — Z950 Presence of cardiac pacemaker: Secondary | ICD-10-CM

## 2021-06-29 DIAGNOSIS — R471 Dysarthria and anarthria: Secondary | ICD-10-CM | POA: Diagnosis not present

## 2021-06-29 DIAGNOSIS — E038 Other specified hypothyroidism: Secondary | ICD-10-CM

## 2021-06-29 DIAGNOSIS — G459 Transient cerebral ischemic attack, unspecified: Secondary | ICD-10-CM

## 2021-06-29 DIAGNOSIS — R29700 NIHSS score 0: Secondary | ICD-10-CM | POA: Diagnosis not present

## 2021-06-29 DIAGNOSIS — I10 Essential (primary) hypertension: Secondary | ICD-10-CM | POA: Diagnosis not present

## 2021-06-29 DIAGNOSIS — I480 Paroxysmal atrial fibrillation: Secondary | ICD-10-CM | POA: Diagnosis not present

## 2021-06-29 DIAGNOSIS — I6523 Occlusion and stenosis of bilateral carotid arteries: Secondary | ICD-10-CM | POA: Diagnosis not present

## 2021-06-29 DIAGNOSIS — I495 Sick sinus syndrome: Secondary | ICD-10-CM | POA: Diagnosis not present

## 2021-06-29 DIAGNOSIS — H538 Other visual disturbances: Secondary | ICD-10-CM | POA: Diagnosis not present

## 2021-06-29 LAB — HEMOGLOBIN A1C
Hgb A1c MFr Bld: 5.8 % — ABNORMAL HIGH (ref 4.8–5.6)
Mean Plasma Glucose: 120 mg/dL

## 2021-06-29 LAB — LIPID PANEL
Cholesterol: 97 mg/dL (ref 0–200)
HDL: 50 mg/dL (ref >40)
LDL Cholesterol: 39 mg/dL (ref 0–99)
Total CHOL/HDL Ratio: 1.9 RATIO
Triglycerides: 41 mg/dL (ref <150)
VLDL: 8 mg/dL (ref 0–40)

## 2021-06-29 LAB — ECHOCARDIOGRAM COMPLETE
AR max vel: 2.3 cm2
AV Area VTI: 2.55 cm2
AV Area mean vel: 2.29 cm2
AV Mean grad: 3 mmHg
AV Peak grad: 6.6 mmHg
Ao pk vel: 1.28 m/s
Area-P 1/2: 3.53 cm2
Calc EF: 74 %
Height: 62.5 in
S' Lateral: 2.4 cm
Single Plane A2C EF: 68.1 %
Single Plane A4C EF: 77.8 %
Weight: 2880 oz

## 2021-06-29 MED ORDER — GABAPENTIN 300 MG PO CAPS
300.0000 mg | ORAL_CAPSULE | Freq: Every day | ORAL | Status: DC
  Administered 2021-06-29: 300 mg via ORAL
  Filled 2021-06-29: qty 1

## 2021-06-29 MED ORDER — LEVOTHYROXINE SODIUM 100 MCG PO TABS
100.0000 ug | ORAL_TABLET | Freq: Every morning | ORAL | Status: DC
  Administered 2021-06-29: 100 ug via ORAL
  Filled 2021-06-29: qty 1

## 2021-06-29 MED ORDER — LORAZEPAM 2 MG/ML IJ SOLN: 1.0000 mg | INTRAMUSCULAR | Status: DC

## 2021-06-29 MED ORDER — IOHEXOL 350 MG/ML SOLN
65.0000 mL | Freq: Once | INTRAVENOUS | Status: AC | PRN
  Administered 2021-06-29: 65 mL via INTRAVENOUS

## 2021-06-29 MED ORDER — STROKE: EARLY STAGES OF RECOVERY BOOK
Freq: Once | Status: AC
  Filled 2021-06-29: qty 1

## 2021-06-29 MED ORDER — APIXABAN 5 MG PO TABS
5.0000 mg | ORAL_TABLET | Freq: Two times a day (BID) | ORAL | Status: DC
  Administered 2021-06-29: 5 mg via ORAL
  Filled 2021-06-29: qty 1

## 2021-06-29 MED ORDER — ROSUVASTATIN CALCIUM 20 MG PO TABS
20.0000 mg | ORAL_TABLET | Freq: Every day | ORAL | Status: DC
  Administered 2021-06-29: 20 mg via ORAL
  Filled 2021-06-29: qty 1

## 2021-06-29 MED ORDER — GABAPENTIN 300 MG PO CAPS
300.0000 mg | ORAL_CAPSULE | Freq: Every day | ORAL | Status: DC
Start: 2021-06-29 — End: 2021-06-29

## 2021-06-29 NOTE — Assessment & Plan Note (Addendum)
Patient presenting to ED with poor vision, dysarthria.  CT head negative for acute findings but showed old lacunar infarct in the right basal ganglia.  Neurology was consulted and followed during hospital course.  LDL 79.  TTE with LVEF 60 to 65%, no regional wall motion abnormalities LV, trivial MR, no aortic stenosis, IVC within normal limits.  Carotid ultrasound with extracranial vessels were near normal with only minimal wall thickening or plaque bilaterally, bilateral vertebral arteries demonstrate antegrade flow.  CT angiogram head/neck with no emergent finding or flow-limiting stenosis.  Neurology recommended MRI brain, although limited exam to axial diffusion imaging with no acute or subacute infarction other cause of restricted diffusion noted and patient would not complete exam.   Patient will continue Eliquis and Crestor 20 mg p.o. daily.  Evaluated by PT/OT/SLP without any recommendations after discharge.  Neurology believes presenting symptoms more consistent with hypertensive encephalopathy rather than TIA.  Outpatient follow-up with neurology 4 weeks. ?

## 2021-06-29 NOTE — Progress Notes (Signed)
? ?  Echocardiogram ?2D Echocardiogram has been performed. ? ?Michele Thompson ?06/29/2021, 10:40 AM ?

## 2021-06-29 NOTE — Progress Notes (Signed)
Carotid artery duplex completed. ?Refer to "CV Proc" under chart review to view preliminary results. ? ?06/29/2021 3:50 PM ?Kelby Aline., MHA, RVT, RDCS, RDMS   ?

## 2021-06-29 NOTE — Progress Notes (Signed)
Notified  ?Dr. Royal Piedra of Pt. Requesting her nightly gabapentin and of Pt. BP: 184/91. ?

## 2021-06-29 NOTE — Assessment & Plan Note (Addendum)
Continue home lisinopril 40 mg p.o. daily, hydrochlorothiazide 12.5 mg p.o. daily.  Outpatient follow-up PCP. ?

## 2021-06-29 NOTE — Assessment & Plan Note (Addendum)
Stable.  S/p pacemaker.  Outpatient follow-up with cardiology ?

## 2021-06-29 NOTE — Discharge Summary (Signed)
Physician Discharge Summary  [name]:[id number] DOB: October 09, 1940 DOA: 06/28/2021  PCP: Kathyrn Lass, MD  Admit date: 06/28/2021 Discharge date: 06/29/2021  Admitted From: Home Disposition: Home  Recommendations for Outpatient Follow-up:  Follow up with PCP in 1-2 weeks Follow-up with neurology, 4 weeks Continue to monitor BP closely and adjust antihypertensives as needed Follow-up hemoglobin A1c that was pending at time of discharge  Home Health: No Equipment/Devices: None  Discharge Condition: Stable CODE STATUS: Full code Diet recommendation: Heart healthy diet  History of present illness:  Michele Thompson is a 81 y.o. female with medical history significant of massive pulmonary embolism complicated by cardiac arrest while on estrogen therapy in 2017, paroxysmal atrial fibrillation s/p cardioversion in 2019 on Eliquis, CAD and suspected coronary vasospasm, hypertension, hyperlipidemia who presents with concerns of vision changes and trouble word finding.   Around 3 PM yesterday on 3/12 patient noted acute to blurry vision that is worse on the right.  She was noticing black floaters.  She closed her eyes for about 20 minutes and symptoms resolved.  However around 4 PM she was talking to her son over the phone and could comprehend him but had trouble word finding.  She denies any focal extremity weakness.  Reports a similar episode of vision changes about a year ago where she also had elevated blood pressure but was discharged home following a negative CT.   She presented to outside ED where all her symptoms were resolved.  CTA head was negative for acute findings but did show old lacunar infarct in the right basal ganglia. She was afebrile, hypertensive up to 180s over 90s on room air.  CBC and BMP was unremarkable. EKG with normal sinus rhythm. Transfer was requested from outside ED to [facility] for further stroke work-up and neurology consult.  Hospital course:  Assessment and  Plan: * Hypertensive encephalopathy Patient presenting to ED with poor vision, dysarthria.  CT head negative for acute findings but showed old lacunar infarct in the right basal ganglia.  Neurology was consulted and followed during hospital course.  LDL 79.  TTE with LVEF 60 to 65%, no regional wall motion abnormalities LV, trivial MR, no aortic stenosis, IVC within normal limits.  Carotid ultrasound with extracranial vessels were near normal with only minimal wall thickening or plaque bilaterally, bilateral vertebral arteries demonstrate antegrade flow.  CT angiogram head/neck with no emergent finding or flow-limiting stenosis.  Neurology recommended MRI brain, although limited exam to axial diffusion imaging with no acute or subacute infarction other cause of restricted diffusion noted and patient would not complete exam.   Patient will continue Eliquis and Crestor 20 mg p.o. daily.  Evaluated by PT/OT/SLP without any recommendations after discharge.  Neurology believes presenting symptoms more consistent with hypertensive encephalopathy rather than TIA.  Outpatient follow-up with neurology 4 weeks.  Essential hypertension Continue home lisinopril 40 mg p.o. daily, hydrochlorothiazide 12.5 mg p.o. daily.  Outpatient follow-up PCP.  Paroxysmal atrial fibrillation (HCC) Continue Eliquis 5 mg p.o. twice daily  Tachycardia-bradycardia syndrome (HCC) Stable.  S/p pacemaker.  Outpatient follow-up with cardiology  Hypothyroidism Continue levothyroxine 100 mcg p.o. daily       Discharge Diagnoses:  Principal Problem:   Hypertensive encephalopathy Active Problems:   Essential hypertension   Paroxysmal atrial fibrillation (HCC)   Hypothyroidism   Tachycardia-bradycardia syndrome [facility])    Discharge Instructions  Discharge Instructions     Ambulatory referral to Neurology   Complete by: As directed    An  appointment is requested in approximately: 4 weeks   Call MD for:  difficulty  breathing, headache or visual disturbances   Complete by: As directed    Call MD for:  extreme fatigue   Complete by: As directed    Call MD for:  persistant dizziness or light-headedness   Complete by: As directed    Call MD for:  persistant nausea and vomiting   Complete by: As directed    Call MD for:  severe uncontrolled pain   Complete by: As directed    Call MD for:  temperature >100.4   Complete by: As directed    Diet - low sodium heart healthy   Complete by: As directed    Increase activity slowly   Complete by: As directed       Allergies as of 06/29/2021   No Known Allergies      Medication List     STOP taking these medications    diltiazem 30 MG tablet Commonly known as: CARDIZEM       TAKE these medications    Eliquis 5 MG Tabs tablet Generic drug: apixaban TAKE ONE TABLET BY MOUTH TWICE DAILY What changed: how much to take   gabapentin 300 MG capsule Commonly known as: NEURONTIN Take 300 mg by mouth at bedtime.   hydrochlorothiazide 12.5 MG tablet Commonly known as: HYDRODIURIL Take 1 tablet (12.5 mg total) by mouth daily. TAKE 1 TABLET EACH DAY. What changed: additional instructions   levothyroxine 100 MCG tablet Commonly known as: SYNTHROID Take 100 mcg by mouth every morning.   lisinopril 40 MG tablet Commonly known as: ZESTRIL TAKE 1 TABLET EACH DAY. What changed: See the new instructions.   rosuvastatin 20 MG tablet Commonly known as: CRESTOR Take 1 tablet (20 mg total) by mouth daily. What changed: when to take this   Vitamin D3 50 MCG (2000 UT) Tabs Take 2,000 Units by mouth daily.        Follow-up Information     [facility]. Schedule an appointment as soon as possible for a visit in 1 month(s).   Specialty: Neurology Why: stroke clinic Contact information: [place] [phone]        Kathyrn Lass, MD. Schedule an appointment as soon as  possible for a visit in 1 week(s).   Specialty: Family Medicine Contact information: [place] [address] [phone]         Sanda Klein, MD .   Specialty: Cardiology Contact information: [address] [place] [address] [phone]                No Known Allergies  Consultations: Neurology   Procedures/Studies: CT ANGIO HEAD W OR WO CONTRAST  Result Date: 06/29/2021 CLINICAL DATA:  Blurred vision and dysarthria, stroke suspected EXAM: CT ANGIOGRAPHY HEAD TECHNIQUE: Multidetector CT imaging of the head was performed using the standard protocol during bolus administration of intravenous contrast. Multiplanar CT image reconstructions and MIPs were obtained to evaluate the vascular anatomy. RADIATION DOSE REDUCTION: This exam was performed according to the departmental dose-optimization program which includes automated exposure control, adjustment of the mA and/or kV according to patient size and/or use of iterative reconstruction technique. CONTRAST:  30m OMNIPAQUE IOHEXOL 350 MG/ML SOLN COMPARISON:  Head CT from yesterday FINDINGS: CT HEAD Brain: Stable. No evidence of acute infarction, hemorrhage, hydrocephalus, extra-axial collection or mass lesion/mass effect. Dilated perivascular space below the right putamen. Normal brain volume  for age. Vascular: No hyperdense vessel or unexpected calcification. Skull: Normal. Negative for fracture or focal lesion. Sinuses: No acute or significant finding. CTA HEAD Anterior circulation: Mild atheromatous calcification along the carotid siphons. No branch occlusion, beading, flow limiting stenosis, or aneurysm. Posterior circulation: No branch occlusion, beading, flow limiting stenosis, or aneurysm. Venous sinuses: Diffusely patent Anatomic variants: Hypoplastic right P1 segment with large posterior communicating artery. Review of the MIP images confirms the above findings. IMPRESSION: No emergent  finding or flow limiting stenosis. Mild atherosclerosis for age. Electronically Signed   By: Jorje Guild M.D.   On: 06/29/2021 04:13   CT HEAD WO CONTRAST (5MM)  Result Date: 06/28/2021 CLINICAL DATA:  Transient ischemic attack. Loss of speech 1 hour ago. EXAM: CT HEAD WITHOUT CONTRAST TECHNIQUE: Contiguous axial images were obtained from the base of the skull through the vertex without intravenous contrast. RADIATION DOSE REDUCTION: This exam was performed according to the departmental dose-optimization program which includes automated exposure control, adjustment of the mA and/or kV according to patient size and/or use of iterative reconstruction technique. COMPARISON:  None. FINDINGS: Brain: No evidence of acute infarction, hemorrhage, hydrocephalus, extra-axial collection or mass lesion/mass effect. Patchy low-attenuation changes in the deep white matter likely due to small vessel ischemia. Old lacunar infarct in the right basal ganglia. Vascular: Moderate intracranial arterial vascular calcifications. Skull: Normal. Negative for fracture or focal lesion. Sinuses/Orbits: Paranasal sinuses and mastoid air cells are clear. Other: None. IMPRESSION: No acute intracranial abnormalities. Mild chronic small vessel ischemic changes. Electronically Signed   By: Lucienne Capers M.D.   On: 06/28/2021 19:56   MR BRAIN WO CONTRAST  Result Date: 06/29/2021 CLINICAL DATA:  Neuro deficit, acute, stroke suspected. TIA. Slurred speech. Blurred vision. Patient could tolerate only the axial diffusion weighted imaging. EXAM: MRI HEAD WITHOUT CONTRAST TECHNIQUE: Multiplanar, multiecho pulse sequences of the brain and surrounding structures were obtained without intravenous contrast. COMPARISON:  Head CT 06/28/2021 FINDINGS: Diffusion-weighted imaging does not show any acute or subacute infarction or other cause of restricted diffusion. Old ischemic changes are present within the basal ganglia. IMPRESSION: Study limited  axial diffusion imaging. No acute or subacute infarction or other cause of restricted diffusion. Electronically Signed   By: Nelson Chimes M.D.   On: 06/29/2021 14:07   ECHOCARDIOGRAM COMPLETE  Result Date: 06/29/2021    ECHOCARDIOGRAM REPORT   Patient Name:   Michele Thompson Date of Exam: 06/29/2021 Medical Rec #:  [id number]    Height:       62.5 in Accession #:    [id number]   Weight:       180.0 lb Date of Birth:  Nov 05, 1940     BSA:          1.839 m Patient Age:    14 years     BP:           135/62 mmHg Patient Gender: F            HR:           60 bpm. Exam Location:  Inpatient Procedure: 2D Echo, Cardiac Doppler and Color Doppler Indications:    G45.9 TIA  History:        Patient has prior history of Echocardiogram examinations, most                 recent 05/27/2017. CAD and Previous Myocardial Infarction,                 Pacemaker, Arrythmias:Bradycardia and  Atrial Fibrillation; Risk                 Factors:Dyslipidemia and Hypertension. AOV SCLROSIS.  Sonographer:    Beryle Beams Referring Phys: [id number] [place] T TU  Sonographer Comments: No subcostal window. IMPRESSIONS  1. Left ventricular ejection fraction, by estimation, is 60 to 65%. The left ventricle has normal function. The left ventricle has no regional wall motion abnormalities. Left ventricular diastolic parameters are consistent with Grade I diastolic dysfunction (impaired relaxation).  2. Right ventricular systolic function is normal. The right ventricular size is normal.  3. The mitral valve is normal in structure. Trivial mitral valve regurgitation. No evidence of mitral stenosis.  4. The aortic valve is normal in structure. Aortic valve regurgitation is not visualized. Aortic valve sclerosis is present, with no evidence of aortic valve stenosis. Aortic valve area, by VTI measures 2.55 cm. Aortic valve mean gradient measures 3.0 mmHg. Aortic valve Vmax measures 1.28 m/s.  5. The inferior vena cava is normal in size with greater than 50%  respiratory variability, suggesting right atrial pressure of 3 mmHg. FINDINGS  Left Ventricle: Left ventricular ejection fraction, by estimation, is 60 to 65%. The left ventricle has normal function. The left ventricle has no regional wall motion abnormalities. The left ventricular internal cavity size was normal in size. There is  no left ventricular hypertrophy. Left ventricular diastolic parameters are consistent with Grade I diastolic dysfunction (impaired relaxation). Normal left ventricular filling pressure. Right Ventricle: The right ventricular size is normal. No increase in right ventricular wall thickness. Right ventricular systolic function is normal. Left Atrium: Left atrial size was normal in size. Right Atrium: Right atrial size was normal in size. Pericardium: There is no evidence of pericardial effusion. Mitral Valve: The mitral valve is normal in structure. Trivial mitral valve regurgitation. No evidence of mitral valve stenosis. Tricuspid Valve: The tricuspid valve is normal in structure. Tricuspid valve regurgitation is trivial. No evidence of tricuspid stenosis. Aortic Valve: The aortic valve is normal in structure. Aortic valve regurgitation is not visualized. Aortic valve sclerosis is present, with no evidence of aortic valve stenosis. Aortic valve mean gradient measures 3.0 mmHg. Aortic valve peak gradient measures 6.6 mmHg. Aortic valve area, by VTI measures 2.55 cm. Pulmonic Valve: The pulmonic valve was normal in structure. Pulmonic valve regurgitation is not visualized. No evidence of pulmonic stenosis. Aorta: The aortic root is normal in size and structure. Venous: The inferior vena cava is normal in size with greater than 50% respiratory variability, suggesting right atrial pressure of 3 mmHg. IAS/Shunts: No atrial level shunt detected by color flow Doppler. Additional Comments: A device lead is visualized.  LEFT VENTRICLE PLAX 2D LVIDd:         4.20 cm     Diastology LVIDs:          2.40 cm     LV e' medial:    5.11 cm/s LV PW:         0.90 cm     LV E/e' medial:  12.0 LV IVS:        0.80 cm     LV e' lateral:   12.10 cm/s LVOT diam:     1.90 cm     LV E/e' lateral: 5.1 LV SV:         67 LV SV Index:   37 LVOT Area:     2.84 cm  LV Volumes (MOD) LV vol d, MOD A2C: 56.8 ml LV vol d, MOD A4C:  57.3 ml LV vol s, MOD A2C: 18.1 ml LV vol s, MOD A4C: 12.7 ml LV SV MOD A2C:     38.7 ml LV SV MOD A4C:     57.3 ml LV SV MOD BP:      43.8 ml RIGHT VENTRICLE RV S prime:     12.40 cm/s RVOT diam:      2.70 cm TAPSE (M-mode): 2.1 cm LEFT ATRIUM             Index        RIGHT ATRIUM           Index LA diam:        3.80 cm 2.07 cm/m   RA Area:     10.60 cm LA Vol (A2C):   40.6 ml 22.08 ml/m  RA Volume:   22.90 ml  12.45 ml/m LA Vol (A4C):   31.1 ml 16.91 ml/m LA Biplane Vol: 35.6 ml 19.36 ml/m  AORTIC VALVE                    PULMONIC VALVE AV Area (Vmax):    2.30 cm     PV Vmax:       0.65 m/s AV Area (Vmean):   2.29 cm     PV Vmean:      42.500 cm/s AV Area (VTI):     2.55 cm     PV VTI:        0.180 m AV Vmax:           128.00 cm/s  PV Peak grad:  1.7 mmHg AV Vmean:          82.800 cm/s  PV Mean grad:  1.0 mmHg AV VTI:            0.264 m AV Peak Grad:      6.6 mmHg AV Mean Grad:      3.0 mmHg LVOT Vmax:         104.00 cm/s LVOT Vmean:        67.000 cm/s LVOT VTI:          0.237 m LVOT/AV VTI ratio: 0.90  AORTA Ao Root diam: 2.70 cm Ao Asc diam:  3.10 cm MITRAL VALVE               TRICUSPID VALVE MV Area (PHT): 3.53 cm    TR Peak grad:   13.7 mmHg MV Decel Time: 215 msec    TR Vmax:        185.00 cm/s MV E velocity: 61.30 cm/s MV A velocity: 74.10 cm/s  SHUNTS MV E/A ratio:  0.83        Systemic VTI:  0.24 m                            Systemic Diam: 1.90 cm                            Pulmonic Diam: 2.70 cm Fransico Him MD Electronically signed by Fransico Him MD Signature Date/Time: 06/29/2021/10:56:33 AM    Final    VAS US CAROTID  Result Date: 06/29/2021 Carotid Arterial Duplex Study Patient  Name:  [facility] Loleta Chance  Date of Exam:   06/29/2021 Medical Rec #: [id number]     Accession #:    [id number] Date of Birth: [birth year]      Patient Gender: F Patient Age:  80 years Exam Location:  [facility] Procedure:      VAS US CAROTID Referring Phys: Cornelius Moras XU --------------------------------------------------------------------------------  Indications:       TIA. Risk Factors:      Hypertension, hyperlipidemia, coronary artery disease. Comparison Study:  No prior study Performing Technologist: Maudry Mayhew MHA, RDMS, RVT, RDCS  Examination Guidelines: A complete evaluation includes B-mode imaging, spectral Doppler, color Doppler, and power Doppler as needed of all accessible portions of each vessel. Bilateral testing is considered an integral part of a complete examination. Limited examinations for reoccurring indications may be performed as noted.  Right Carotid Findings: +----------+--------+--------+--------+------------------+--------+             PSV cm/s EDV cm/s Stenosis Plaque Description Comments  +----------+--------+--------+--------+------------------+--------+  CCA Prox   79       16                                             +----------+--------+--------+--------+------------------+--------+  CCA Distal 61       15                                             +----------+--------+--------+--------+------------------+--------+  ICA Prox   64       19                                             +----------+--------+--------+--------+------------------+--------+  ICA Distal 89       26                                             +----------+--------+--------+--------+------------------+--------+  ECA        92       14                                             +----------+--------+--------+--------+------------------+--------+ +----------+--------+-------+----------------+-------------------+             PSV cm/s EDV cms Describe         Arm Pressure (mmHG)   +----------+--------+-------+----------------+-------------------+  Subclavian 188              Multiphasic, WNL                      +----------+--------+-------+----------------+-------------------+ +---------+--------+--+--------+--+---------+  Vertebral PSV cm/s 49 EDV cm/s 13 Antegrade  +---------+--------+--+--------+--+---------+  Left Carotid Findings: +----------+--------+--------+--------+------------------+------------------+             PSV cm/s EDV cm/s Stenosis Plaque Description Comments            +----------+--------+--------+--------+------------------+------------------+  CCA Prox   71       17                                   intimal thickening  +----------+--------+--------+--------+------------------+------------------+  CCA Distal 83  19                                                       +----------+--------+--------+--------+------------------+------------------+  ICA Prox   83       20                                                       +----------+--------+--------+--------+------------------+------------------+  ICA Distal 112      31                                                       +----------+--------+--------+--------+------------------+------------------+  ECA        71       8                                                        +----------+--------+--------+--------+------------------+------------------+ +----------+--------+--------+----------------+-------------------+             PSV cm/s EDV cm/s Describe         Arm Pressure (mmHG)  +----------+--------+--------+----------------+-------------------+  Subclavian 157               Multiphasic, WNL                      +----------+--------+--------+----------------+-------------------+ +---------+--------+--+--------+--+---------+  Vertebral PSV cm/s 64 EDV cm/s 15 Antegrade  +---------+--------+--+--------+--+---------+   Summary: Right Carotid: The extracranial vessels were near-normal with only minimal wall                 thickening or plaque. Left Carotid: The extracranial vessels were near-normal with only minimal wall               thickening or plaque. Vertebrals:  Bilateral vertebral arteries demonstrate antegrade flow. Subclavians: Normal flow hemodynamics were seen in bilateral subclavian              arteries. *See table(s) above for measurements and observations.     Preliminary      Subjective: Patient seen examined bedside, resting calmly.  Wishing to discharge home today.  Seen by neurology, okay for discharge with outpatient follow-up in 4 weeks.  No other complaints or concerns at this time.  Symptoms remain completely resolved.  Denies headache, no dizziness, no chest pain, palpitations, no shortness of breath, no abdominal pain, no fever/chills/night sweats, no nausea/vomiting/diarrhea, no weakness, no fatigue, no paresthesias.  No acute events overnight per nursing staff.  Discharge Exam: Vitals:   06/29/21 0754 06/29/21 1216  BP: 128/60 129/64  Pulse: 60 67  Resp: 16 16  Temp: 98.1 F (36.7 C) 98.4 F (36.9 C)  SpO2: 98% 98%   Vitals:   06/29/21 0042 06/29/21 0324 06/29/21 0754 06/29/21 1216  BP: (!) 184/91 135/62 128/60 129/64  Pulse: 60 72 60 67  Resp: '18 16 16 16  '$ Temp:  97.7 F (36.5 C) 98.5 F (36.9 C) 98.1 F (36.7 C) 98.4 F (36.9 C)  TempSrc: Oral Oral Oral Oral  SpO2: 99% 100% 98% 98%  Weight:      Height:        Physical Exam: GEN: NAD, alert and oriented x 3, wd/wn HEENT: NCAT, PERRL, EOMI, sclera clear, MMM PULM: CTAB w/o wheezes/crackles, normal respiratory effort, on room air CV: RRR w/o M/G/R GI: abd soft, NTND, NABS, no R/G/M MSK: no peripheral edema, muscle strength globally intact 5/5 bilateral upper/lower extremities NEURO: CN II-XII intact, no focal deficits, sensation to light touch intact PSYCH: normal mood/affect Integumentary: dry/intact, no rashes or wounds    The results of significant diagnostics from this hospitalization (including  imaging, microbiology, ancillary and laboratory) are listed below for reference.     Microbiology: Recent Results (from the past 240 hour(s))  Resp Panel by RT-PCR (Flu A&B, Covid) Nasopharyngeal Swab     Status: None   Collection Time: 06/28/21 10:42 PM   Specimen: Nasopharyngeal Swab; Nasopharyngeal(NP) swabs in vial transport medium  Result Value Ref Range Status   SARS Coronavirus 2 by RT PCR NEGATIVE NEGATIVE Final    Comment: (NOTE) SARS-CoV-2 target nucleic acids are NOT DETECTED.  The SARS-CoV-2 RNA is generally detectable in upper respiratory specimens during the acute phase of infection. The lowest concentration of SARS-CoV-2 viral copies this assay can detect is 138 copies/mL. A negative result does not preclude SARS-Cov-2 infection and should not be used as the sole basis for treatment or other patient management decisions. A negative result may occur with  improper specimen collection/handling, submission of specimen other than nasopharyngeal swab, presence of viral mutation(s) within the areas targeted by this assay, and inadequate number of viral copies(<138 copies/mL). A negative result must be combined with clinical observations, patient history, and epidemiological information. The expected result is Negative.  Fact Sheet for Patients:  EntrepreneurPulse.com.au  Fact Sheet for Healthcare Providers:  IncredibleEmployment.be  This test is no t yet approved or cleared by the [place] FDA and  has been authorized for detection and/or diagnosis of SARS-CoV-2 by FDA under an Emergency Use Authorization (EUA). This EUA will remain  in effect (meaning this test can be used) for the duration of the COVID-19 declaration under Section 564(b)(1) of the Act, 21 U.S.C.section 360bbb-3(b)(1), unless the authorization is terminated  or revoked sooner.       Influenza A by PCR NEGATIVE NEGATIVE Final   Influenza B by PCR NEGATIVE  NEGATIVE Final    Comment: (NOTE) The Xpert Xpress SARS-CoV-2/FLU/RSV plus assay is intended as an aid in the diagnosis of influenza from Nasopharyngeal swab specimens and should not be used as a sole basis for treatment. Nasal washings and aspirates are unacceptable for Xpert Xpress SARS-CoV-2/FLU/RSV testing.  Fact Sheet for Patients: EntrepreneurPulse.com.au  Fact Sheet for Healthcare Providers: IncredibleEmployment.be  This test is not yet approved or cleared by the [place] FDA and has been authorized for detection and/or diagnosis of SARS-CoV-2 by FDA under an Emergency Use Authorization (EUA). This EUA will remain in effect (meaning this test can be used) for the duration of the COVID-19 declaration under Section 564(b)(1) of the Act, 21 U.S.C. section 360bbb-3(b)(1), unless the authorization is terminated or revoked.  Performed at [facility], [address], [place], [place] [address]      Labs: BNP (last 3 results) No results for input(s): BNP in the last 8760 hours. Basic Metabolic Panel: Recent Labs  Lab 06/28/21  1846  NA 138  K 3.8  CL 103  CO2 28  GLUCOSE 114*  BUN 28*  CREATININE 0.83  CALCIUM 9.5   Liver Function Tests: Recent Labs  Lab 06/28/21 1846  AST 19  ALT 7  ALKPHOS 51  BILITOT 0.3  PROT 7.1  ALBUMIN 4.2   No results for input(s): LIPASE, AMYLASE in the last 168 hours. No results for input(s): AMMONIA in the last 168 hours. CBC: Recent Labs  Lab 06/28/21 1846  WBC 5.5  NEUTROABS 3.1  HGB 12.8  HCT 38.7  MCV 94.4  PLT 159   Cardiac Enzymes: No results for input(s): CKTOTAL, CKMB, CKMBINDEX, TROPONINI in the last 168 hours. BNP: Invalid input(s): POCBNP CBG: Recent Labs  Lab 06/28/21 1909  GLUCAP 103*   D-Dimer No results for input(s): DDIMER in the last 72 hours. Hgb A1c No results for input(s): HGBA1C in the last 72 hours. Lipid Profile Recent Labs     06/29/21 0243  CHOL 97  HDL 50  LDLCALC 39  TRIG 41  CHOLHDL 1.9   Thyroid function studies No results for input(s): TSH, T4TOTAL, T3FREE, THYROIDAB in the last 72 hours.  Invalid input(s): FREET3 Anemia work up No results for input(s): VITAMINB12, FOLATE, FERRITIN, TIBC, IRON, RETICCTPCT in the last 72 hours. Urinalysis No results found for: COLORURINE, APPEARANCEUR, [place], [place], GLUCOSEU, [place], [place], [place], PROTEINUR, UROBILINOGEN, NITRITE, LEUKOCYTESUR Sepsis Labs Invalid input(s): PROCALCITONIN,  WBC,  LACTICIDVEN Microbiology Recent Results (from the past 240 hour(s))  Resp Panel by RT-PCR (Flu A&B, Covid) Nasopharyngeal Swab     Status: None   Collection Time: 06/28/21 10:42 PM   Specimen: Nasopharyngeal Swab; Nasopharyngeal(NP) swabs in vial transport medium  Result Value Ref Range Status   SARS Coronavirus 2 by RT PCR NEGATIVE NEGATIVE Final    Comment: (NOTE) SARS-CoV-2 target nucleic acids are NOT DETECTED.  The SARS-CoV-2 RNA is generally detectable in upper respiratory specimens during the acute phase of infection. The lowest concentration of SARS-CoV-2 viral copies this assay can detect is 138 copies/mL. A negative result does not preclude SARS-Cov-2 infection and should not be used as the sole basis for treatment or other patient management decisions. A negative result may occur with  improper specimen collection/handling, submission of specimen other than nasopharyngeal swab, presence of viral mutation(s) within the areas targeted by this assay, and inadequate number of viral copies(<138 copies/mL). A negative result must be combined with clinical observations, patient history, and epidemiological information. The expected result is Negative.  Fact Sheet for Patients:  EntrepreneurPulse.com.au  Fact Sheet for Healthcare Providers:  IncredibleEmployment.be  This test is no t yet approved or cleared by the  [place] FDA and  has been authorized for detection and/or diagnosis of SARS-CoV-2 by FDA under an Emergency Use Authorization (EUA). This EUA will remain  in effect (meaning this test can be used) for the duration of the COVID-19 declaration under Section 564(b)(1) of the Act, 21 U.S.C.section 360bbb-3(b)(1), unless the authorization is terminated  or revoked sooner.       Influenza A by PCR NEGATIVE NEGATIVE Final   Influenza B by PCR NEGATIVE NEGATIVE Final    Comment: (NOTE) The Xpert Xpress SARS-CoV-2/FLU/RSV plus assay is intended as an aid in the diagnosis of influenza from Nasopharyngeal swab specimens and should not be used as a sole basis for treatment. Nasal washings and aspirates are unacceptable for Xpert Xpress SARS-CoV-2/FLU/RSV testing.  Fact Sheet for Patients: EntrepreneurPulse.com.au  Fact Sheet for Healthcare Providers: IncredibleEmployment.be  This test is not yet approved or cleared by the [place] and has been authorized for detection and/or diagnosis of SARS-CoV-2 by FDA under an Emergency Use Authorization (EUA). This EUA will remain in effect (meaning this test can be used) for the duration of the COVID-19 declaration under Section 564(b)(1) of the Act, 21 U.S.C. section 360bbb-3(b)(1), unless the authorization is terminated or revoked.  Performed at [facility], [address], [place], [place] [address]      Time coordinating discharge: Over 30 minutes  SIGNED:   Donnamarie Poag [place] DO  [facility] 06/29/2021, 4:51 PM

## 2021-06-29 NOTE — Care Management Obs Status (Signed)
MEDICARE OBSERVATION STATUS NOTIFICATION ? ? ?Patient Details  ?Name: Michele Thompson ?MRN: [id number] ?Date of Birth: [birth year] ? ? ?Medicare Observation Status Notification Given:  Yes ? ? ? ?Pollie Friar, RN ?06/29/2021, 4:08 PM ?

## 2021-06-29 NOTE — Evaluation (Signed)
Physical Therapy Evaluation and DISCHARGE ?Patient Details ?Name: Michele Thompson ?MRN: [id number] ?DOB: [birth year] ?Today's Date: 06/29/2021 ? ?History of Present Illness ? : Michele Thompson is a 81 y.o. female with medical history significant of massive pulmonary embolism complicated by cardiac arrest while on estrogen therapy in 2017, paroxysmal atrial fibrillation s/p cardioversion in 2019 on Eliquis, CAD and suspected coronary vasospasm, hypertension, hyperlipidemia who presents with concerns of vision changes and trouble word finding. CT neg for acute infarcts ?  ?Clinical Impression ? Pt functioning at baseline. Pt reports vision impairment and word finding difficulties to be resolved. Pt at ILF at [facility]. Pt scored 21/24 on DGI indicating minimal falls risk. Pt with no further acute PT needs at this time. PT SIGNING off. Please re-consult if needed in future.   ?   ? ?Recommendations for follow up therapy are one component of a multi-disciplinary discharge planning process, led by the attending physician.  Recommendations may be updated based on patient status, additional functional criteria and insurance authorization. ? ?Follow Up Recommendations No PT follow up ? ?  ?Assistance Recommended at Discharge PRN  ?Patient can return home with the following ?   ? ?  ?Equipment Recommendations None recommended by PT  ?Recommendations for Other Services ?    ?  ?Functional Status Assessment Patient has had a recent decline in their functional status and demonstrates the ability to make significant improvements in function in a reasonable and predictable amount of time.  ? ?  ?Precautions / Restrictions Precautions ?Precautions: None ?Restrictions ?Weight Bearing Restrictions: No  ? ?  ? ?Mobility ? Bed Mobility ?Overal bed mobility: Independent ?  ?  ?  ?  ?  ?  ?General bed mobility comments: HOB slightly elevated ?  ? ?Transfers ?Overall transfer level: Independent ?Equipment used: None ?  ?  ?  ?  ?  ?  ?  ?General  transfer comment: no difficulty ?  ? ?Ambulation/Gait ?Ambulation/Gait assistance: Independent ?Gait Distance (Feet): 300 Feet ?Assistive device: None ?Gait Pattern/deviations: WFL(Within Functional Limits), Step-through pattern, Decreased stride length ?Gait velocity: wfl for age ?Gait velocity interpretation: >2.62 ft/sec, indicative of community ambulatory ?  ?General Gait Details: pt ambulating appropriate speed for age, no LOB ? ?Stairs ?Stairs: Yes ?Stairs assistance: Min guard ?Stair Management: One rail Right, Alternating pattern, Forwards ?Number of Stairs: 5 ?General stair comments: no difficulty ? ?Wheelchair Mobility ?  ? ?Modified Rankin (Stroke Patients Only) ?Modified Rankin (Stroke Patients Only) ?Pre-Morbid Rankin Score: No significant disability ?Modified Rankin: No significant disability ? ?  ? ?Balance Overall balance assessment: No apparent balance deficits (not formally assessed) ?  ?  ?  ?  ?  ?  ?  ?  ?  ?  ?  ?  ?  ?  ?  ?Standardized Balance Assessment ?Standardized Balance Assessment : Dynamic Gait Index ?  ?Dynamic Gait Index ?Level Surface: Normal ?Change in Gait Speed: Normal ?Gait with Horizontal Head Turns: Normal ?Gait with Vertical Head Turns: Normal ?Gait and Pivot Turn: Normal ?Step Over Obstacle: Mild Impairment ?Step Around Obstacles: Mild Impairment ?Steps: Mild Impairment ?Total Score: 21 ?   ? ? ? ?Pertinent Vitals/Pain Pain Assessment ?Pain Assessment: No/denies pain  ? ? ?Home Living Family/patient expects to be discharged to:: Private residence ?Living Arrangements: Alone ?Available Help at Discharge: Family;Available PRN/intermittently ?Type of Home: Apartment ?Home Access: Level entry ?  ?  ?  ?Home Layout: One level ?  ?Additional Comments: from indep living at  [facility]  ?  ?Prior Function Prior Level of Function : Independent/Modified Independent ?  ?  ?  ?  ?  ?  ?Mobility Comments: amb without AD, drives ?ADLs Comments: pt dress/baths self, can make self  something to eat but goes to [employer] majority of time ?  ? ? ?Hand Dominance  ? Dominant Hand: Right ? ?  ?Extremity/Trunk Assessment  ? Upper Extremity Assessment ?Upper Extremity Assessment: Overall WFL for tasks assessed ?  ? ?Lower Extremity Assessment ?Lower Extremity Assessment: Overall WFL for tasks assessed ?  ? ?Cervical / Trunk Assessment ?Cervical / Trunk Assessment: Normal  ?Communication  ? Communication: HOH (can read lips better)  ?Cognition Arousal/Alertness: Awake/alert ?Behavior During Therapy: [facility] for tasks assessed/performed ?Overall Cognitive Status: Within Functional Limits for tasks assessed ?  ?  ?  ?  ?  ?  ?  ?  ?  ?  ?  ?  ?  ?  ?  ?  ?  ?  ?  ? ?  ?General Comments General comments (skin integrity, edema, etc.): pt indep with tolieting and washing hands at sink ? ?  ?Exercises    ? ?Assessment/Plan  ?  ?PT Assessment Patient does not need any further PT services  ?PT Problem List   ? ?   ?  ?PT Treatment Interventions     ? ?PT Goals (Current goals can be found in the Care Plan section)  ?Acute Rehab PT Goals ?Patient Stated Goal: home ASAP ?PT Goal Formulation: All assessment and education complete, DC therapy ? ?  ?Frequency   ?  ? ? ?Co-evaluation   ?  ?  ?  ?  ? ? ?  ?AM-PAC PT "6 Clicks" Mobility  ?Outcome Measure Help needed turning from your back to your side while in a flat bed without using bedrails?: None ?Help needed moving from lying on your back to sitting on the side of a flat bed without using bedrails?: None ?Help needed moving to and from a bed to a chair (including a wheelchair)?: None ?Help needed standing up from a chair using your arms (e.g., wheelchair or bedside chair)?: None ?  ?Help needed climbing 3-5 steps with a railing? : A Little ?6 Click Score: 19 ? ?  ?End of Session Equipment Utilized During Treatment: Gait belt ?Activity Tolerance: Patient tolerated treatment well ?Patient left: in chair;with call bell/phone within reach (helped pt order breakfast  and lunch due to [facility]) ?Nurse Communication: Mobility status ?PT Visit Diagnosis: Difficulty in walking, not elsewhere classified (R26.2) ?  ? ?Time: 2878-6767 ?PT Time Calculation (min) (ACUTE ONLY): 21 min ? ? ?Charges:   PT Evaluation ?$PT Eval Moderate Complexity: 1 Mod ?  ?  ?   ? ? ?Kittie Plater, PT, DPT ?Acute Rehabilitation Services ?Pager #: [phone] ?Office #: [phone] ? ? ? M  ?06/29/2021, 9:37 AM ? ?

## 2021-06-29 NOTE — H&P (Signed)
History and Physical    Patient: Michele Thompson EBR:[id number] DOB: [birth year] DOA: 06/28/2021 DOS: the patient was seen and examined on 06/29/2021 PCP: Kathyrn Lass, MD  Patient coming from: [facility]  Chief Complaint:  Chief Complaint  Patient presents with   Neurologic Problem   HPI: MILLIANA Thompson is a 81 y.o. female with medical history significant of massive pulmonary embolism complicated by cardiac arrest while on estrogen therapy in 2017, paroxysmal atrial fibrillation s/p cardioversion in 2019 on Eliquis, CAD and suspected coronary vasospasm, hypertension, hyperlipidemia who presents with concerns of vision changes and trouble word finding.  Around 3 PM yesterday on 3/12 patient noted acute to blurry vision that is worse on the right.  She was noticing black floaters.  She closed her eyes for about 20 minutes and symptoms resolved.  However around 4 PM she was talking to her son over the phone and could comprehend him but had trouble word finding.  She denies any focal extremity weakness.  Reports a similar episode of vision changes about a year ago where she also had elevated blood pressure but was discharged home following a negative CT.  She presented to outside ED where all her symptoms were resolved.  CTA head was negative for acute findings but did show old lacunar infarct in the right basal ganglia. She was afebrile, hypertensive up to 180s over 90s on room air.  CBC and BMP was unremarkable. EKG with normal sinus rhythm.  Transfer was requested from outside ED to [facility] for further stroke work-up and neurology consult.  Review of Systems: As mentioned in the history of present illness. All other systems reviewed and are negative. Past Medical History:  Diagnosis Date   Anxiety    Aortic atherosclerosis ([place])    Arthritis    Constipation    Coronary artery disease 03/16/2016   a. admx with angina and mildly elevated Troponin >> LHC: mLAD 30 >> CCB  started for poss microvascular angina   Depression    Diverticulosis    Edema of both lower extremities    Fatty liver    History of MI (myocardial infarction)    Hyperlipidemia 03/16/2016   Hypertension    Hypothyroidism    Joint pain    Pacemaker    Pacemaker    Paroxysmal A-fib ([place])    PE (pulmonary thromboembolism) ([place]) 03/17/2016   Bilateral submassive pulmonary embolus with RUE and RLE DVT on venous duplex 02/2016 c/b PEA arrest // Echo 03/21/16: mild LVH, EF 60-65, no RWMA, Gr 1 DD, normal RVSF   Psoas tendinitis    Sinus bradycardia 03/31/2016   Thrombocytopenia ([place]) 02/2016   Profound thrombocytopenia noted upon admission for pulmonary embolism >> initially thought to be from Hep induced thrombocytopenia given recent admit for cardiac cath and Heparin use // However, HIT Ab was neg (0.184) // Hematology consult pending   Thyroid disease    Unilateral primary osteoarthritis, right knee    Vascular malformation    mid ascending colon   Vitamin D deficiency    Past Surgical History:  Procedure Laterality Date   CARDIAC CATHETERIZATION N/A 03/15/2016   Procedure: Left Heart Cath and Coronary Angiography;  Surgeon: [name], MD;  Location: [facility];  Service: Cardiovascular;  Laterality: N/A;   INSERT / REPLACE / REMOVE PACEMAKER     PACEMAKER IMPLANT N/A 07/21/2020   Procedure: PACEMAKER IMPLANT;  Surgeon: Sanda Klein, MD;  Location: [facility];  Service: Cardiovascular;  Laterality: N/A;   TOTAL KNEE ARTHROPLASTY Right 12/27/2016   Procedure: RIGHT TOTAL KNEE ARTHROPLASTY;  Surgeon: Gaynelle Arabian, MD;  Location: [facility] OR[facility];  Service: Orthopedics;  Laterality: Right;   VAGINAL HYSTERECTOMY  1983   endometriosis   Social History:  reports that she has never smoked. She has never been exposed to tobacco smoke. She has never used smokeless tobacco. She reports that she does not drink alcohol and does not use drugs.  No Known Allergies  Family History   Problem Relation Age of Onset   Heart failure Mother    Heart disease Mother    Heart failure Father    High blood pressure Father    Stroke Father    Heart disease Father    Heart failure Maternal Grandmother    Myasthenia gravis Brother    Stroke Brother    Clotting disorder Neg Hx    Colon cancer Neg Hx     Prior to Admission medications   Medication Sig Start Date End Date Taking? Authorizing Provider  Cholecalciferol (VITAMIN D3) 2000 units TABS Take 2,000 Units by mouth daily.    [provider]  clindamycin (CLEOCIN T) 1 % lotion Apply topically 2 (two) times daily. 02/19/21   [provider]  clobetasol (TEMOVATE) 0.05 % external solution SMARTSIG:Sparingly Topical 1-2 Times Daily 02/19/21   [provider]  diltiazem (CARDIZEM) 30 MG tablet Take 1 tablet (30 mg total) by mouth 3 (three) times daily as needed (rapid palpitations). Patient not taking: Reported on 02/23/2021 12/17/20   Croitoru, Mihai, MD  ELIQUIS 5 MG TABS tablet TAKE ONE TABLET BY MOUTH TWICE DAILY 05/27/21   Croitoru, Mihai, MD  gabapentin (NEURONTIN) 300 MG capsule Take 300 mg by mouth at bedtime. 06/20/17   [provider]  hydrochlorothiazide (HYDRODIURIL) 12.5 MG tablet Take 1 tablet (12.5 mg total) by mouth daily. TAKE 1 TABLET EACH DAY. 02/23/21   Croitoru, Mihai, MD  ketoconazole (NIZORAL) 2 % shampoo Apply topically. 02/19/21   [provider]  levothyroxine (SYNTHROID) 100 MCG tablet Take 100 mcg by mouth every morning. 01/27/21   [provider]  lisinopril (ZESTRIL) 40 MG tablet TAKE 1 TABLET EACH DAY. 07/04/20   Croitoru, Mihai, MD  rosuvastatin (CRESTOR) 20 MG tablet Take 1 tablet (20 mg total) by mouth daily. 02/23/21   Croitoru, Mihai, MD  Scar Treatment Products (STRATA TRIZ) GEL Apply topically 2 (two) times daily. to affected area(s) 02/20/21   [provider]    Physical Exam: Vitals:   06/28/21 2245 06/28/21 2315 06/28/21 2345 06/29/21  0042  BP: (!) 137/50 (!) 131/54 (!) 179/61 (!) 184/91  Pulse: (!) 59 60 (!) 58 60  Resp: 15 14 (!) 24 18  Temp:    97.7 F (36.5 C)  TempSrc:    Oral  SpO2: 97% 95% 100% 99%  Weight:      Height:       Constitutional: NAD, calm, comfortable, elderly female appearing younger than stated age laying at approximately 40 degree incline in bed Eyes: lids and conjunctivae normal ENMT: Mucous membranes are moist.  Wears hearing aids bilaterally.   Neck: normal, supple Respiratory: clear to auscultation bilaterally, no wheezing, no crackles. Normal respiratory effort. No accessory muscle use.  Cardiovascular: Regular rate and rhythm, no murmurs / rubs / gallops. No extremity edema.  Abdomen: no tenderness,  Bowel sounds positive.  Musculoskeletal: no clubbing / cyanosis. No joint deformity upper and lower extremities. Good ROM, no contractures. Normal muscle tone.  Skin: no rashes, lesions, ulcers. No induration Neurologic: CN 2-12 grossly intact.  No nystagmus, blurred or double vision.  No facial asymmetry, equal shoulder shrug, equal symmetric smile, strength 5/5 in all 4.  Intact heel-to-shin. Psychiatric: Normal judgment and insight. Alert and oriented x 3. Normal mood. Data Reviewed:  See HPI  Assessment and Plan: Blurred vision, bilateral -Had symptoms of blurred vision and dysarthria that have resolved on arrival concerning for TIA. CT head negative for acute findings but showed old lacunar infarct in the right basal ganglia. -Takes Eliquis for paroxysmal atrial fibrillation and has not missed any doses.  Appreciate neurology recommendation for any additional antiplatelet therapy. Neurology Dr. Theda Sers was consulted. - obtain MRI brain - obtain CTA head and neck - obtain echocardiogram  - Continue rouvastatin '20mg'$ - consider increase to '40mg'$  pending Lipid panel  -Obtain A1c and lipids -PT/OT/SLT -Frequent neuro checks and keep on telemetry -Allow for permissive hypertension with  blood pressure treatment as needed only if systolic goes above 778  Essential hypertension -Hold home antihypertensives to allow for permissive hypertension  Paroxysmal atrial fibrillation (HCC) - Continue Eliquis  Tachycardia-bradycardia syndrome ([place]) -Stable.  S/p pacemaker  Hypothyroidism Continue levothyroxine      Advance Care Planning:   Code Status: Full Code   Consults: Neurology  Family Communication: no family at bedside  Severity of Illness: The appropriate patient status for this patient is OBSERVATION. Observation status is judged to be reasonable and necessary in order to provide the required intensity of service to ensure the patient's safety. The patient's presenting symptoms, physical exam findings, and initial radiographic and laboratory data in the context of their medical condition is felt to place them at decreased risk for further clinical deterioration. Furthermore, it is anticipated that the patient will be medically stable for discharge from the hospital within 2 midnights of admission.   Author: Orene Desanctis, DO 06/29/2021 2:44 AM  For on call review [email or web address].

## 2021-06-29 NOTE — Progress Notes (Signed)
OT Cancellation Note ? ?Patient Details ?Name: Michele Thompson ?MRN: [id number] ?DOB: [birth year] ? ? ?Cancelled Treatment:    Reason Eval/Treat Not Completed: OT screened, no needs identified, will sign off. Per chart review pt's symptoms have resolved. No change in ability to complete ADL routine. Will sign off.  ? ?Tyrone Schimke, OT ?Acute Rehabilitation Services ?Office: [phone] ? ?06/29/2021, 11:56 AM ?

## 2021-06-29 NOTE — Assessment & Plan Note (Addendum)
Continue Eliquis 5 mg p.o. twice daily 

## 2021-06-29 NOTE — TOC Transition Note (Signed)
Transition of Care (TOC) - CM/SW Discharge Note ? ? ?Patient Details  ?Name: Michele Thompson ?MRN: [id number] ?Date of Birth: June 05, 1940 ? ?Transition of Care (TOC) CM/SW Contact:  ?Pollie Friar, RN ?Phone Number: ?06/29/2021, 4:12 PM ? ? ?Clinical Narrative:    ?Patient is from [place]. No f/u per PT. Pt states she still drives at home and denies any issues with her home medications.  ?She has transport home today.  ?No needs per TOC. ? ? ?Final next level of care: Home/Self Care ?Barriers to Discharge: No Barriers Identified ? ? ?Patient Goals and CMS Choice ?  ?  ?  ? ?Discharge Placement ?  ?           ?  ?  ?  ?  ? ?Discharge Plan and Services ?  ?  ?           ?  ?  ?  ?  ?  ?  ?  ?  ?  ?  ? ?Social Determinants of Health (SDOH) Interventions ?  ? ? ?Readmission Risk Interventions ?No flowsheet data found. ? ? ? ? ?

## 2021-06-29 NOTE — Hospital Course (Signed)
Michele Thompson is a 81 y.o. female with medical history significant of massive pulmonary embolism complicated by cardiac arrest while on estrogen therapy in 2017, paroxysmal atrial fibrillation s/p cardioversion in 2019 on Eliquis, CAD and suspected coronary vasospasm, hypertension, hyperlipidemia who presents with concerns of vision changes and trouble word finding. ?  ?Around 3 PM yesterday on 3/12 patient noted acute to blurry vision that is worse on the right.  She was noticing black floaters.  She closed her eyes for about 20 minutes and symptoms resolved.  However around 4 PM she was talking to her son over the phone and could comprehend him but had trouble word finding.  She denies any focal extremity weakness.  Reports a similar episode of vision changes about a year ago where she also had elevated blood pressure but was discharged home following a negative CT. ?  ?She presented to outside ED where all her symptoms were resolved.  CTA head was negative for acute findings but did show old lacunar infarct in the right basal ganglia. ?She was afebrile, hypertensive up to 180s over 90s on room air.  CBC and BMP was unremarkable. EKG with normal sinus rhythm. Transfer was requested from outside ED to [facility] for further stroke work-up and neurology consult. ?

## 2021-06-29 NOTE — Progress Notes (Signed)
Stroke teaching completed with patient and patient able to teach back the symptoms, what she should do should she have symptoms, even if they were to self resolve.  Patient had no further questions about her medications or d/c instructions.  PIV removed. ?

## 2021-06-29 NOTE — Consult Note (Signed)
Neurology Consult H&P ? ?[name] ?MR# [id number] ?06/29/2021 ? ? ?CC: TIA ? ?History is obtained from: patient and chart. ? ?HPI: Michele Thompson is a 81 y.o. female PMHx as reviewed below PE complicated by cardiac arrest while on estrogen (2017), PAF s/p cardioversion 2019 (apixaban), CAD and suspected coronary vasospasm, hypertension, hyperlipidemia developed vision changes and word finding difficulty and blurry vision R>L ~1500 on 06/28/2021.  She was noticing black floaters.  She closed her eyes for about 20 minutes and symptoms resolved.  However around 1600 she was talking to her son over the phone and could comprehend him but had trouble getting words out.  She denies any focal extremity weakness.  Reports a similar episode of vision changes about a 1 year ago where she also had elevated blood pressure but was discharged home following a negative CT. In ED had SBP 180s. ? ?Though the chart says she had 2 events she firmly states it was only 1 event at 1600. ? ? ?LKW: 1600 ?tNK given: No tomorrow ?IR Thrombectomy No, not indicated ?Modified Rankin Scale: 0-Completely asymptomatic and back to baseline post- stroke ?NIHSS: 0 ? ?ROS: A complete ROS was performed and is negative except as noted in the HPI.  ? ?Past Medical History:  ?Diagnosis Date  ? Anxiety   ? Aortic atherosclerosis ([place])   ? Arthritis   ? Constipation   ? Coronary artery disease 03/16/2016  ? a. admx with angina and mildly elevated Troponin >> LHC: mLAD 30 >> CCB started for poss microvascular angina  ? Depression   ? Diverticulosis   ? Edema of both lower extremities   ? Fatty liver   ? History of MI (myocardial infarction)   ? Hyperlipidemia 03/16/2016  ? Hypertension   ? Hypothyroidism   ? Joint pain   ? Pacemaker   ? Pacemaker   ? Paroxysmal A-fib ([place])   ? PE (pulmonary thromboembolism) ([place]) 03/17/2016  ? Bilateral submassive pulmonary embolus with RUE and RLE DVT on venous duplex 02/2016 c/b PEA arrest // Echo 03/21/16: mild LVH, EF  60-65, no RWMA, Gr 1 DD, normal RVSF  ? Psoas tendinitis   ? Sinus bradycardia 03/31/2016  ? Thrombocytopenia ([place]) 02/2016  ? Profound thrombocytopenia noted upon admission for pulmonary embolism >> initially thought to be from Hep induced thrombocytopenia given recent admit for cardiac cath and Heparin use // However, HIT Ab was neg (0.184) // Hematology consult pending  ? Thyroid disease   ? Unilateral primary osteoarthritis, right knee   ? Vascular malformation   ? mid ascending colon  ? Vitamin D deficiency   ? ? ? ?Family History  ?Problem Relation Age of Onset  ? Heart failure Mother   ? Heart disease Mother   ? Heart failure Father   ? High blood pressure Father   ? Stroke Father   ? Heart disease Father   ? Heart failure Maternal Grandmother   ? Myasthenia gravis Brother   ? Stroke Brother   ? Clotting disorder Neg Hx   ? Colon cancer Neg Hx   ? ? ?Social History:  reports that she has never smoked. She has never been exposed to tobacco smoke. She has never used smokeless tobacco. She reports that she does not drink alcohol and does not use drugs. ? ? ?Prior to Admission medications   ?Medication Sig Start Date End Date Taking? Authorizing Provider  ?Cholecalciferol (VITAMIN D3) 2000 units TABS Take 2,000 Units by mouth daily.  [provider]  ?clindamycin (CLEOCIN T) 1 % lotion Apply topically 2 (two) times daily. 02/19/21   [provider]  ?clobetasol (TEMOVATE) 0.05 % external solution SMARTSIG:Sparingly Topical 1-2 Times Daily 02/19/21   [provider]  ?diltiazem (CARDIZEM) 30 MG tablet Take 1 tablet (30 mg total) by mouth 3 (three) times daily as needed (rapid palpitations). ?Patient not taking: Reported on 02/23/2021 12/17/20   Croitoru, Dani Gobble, MD  ?ELIQUIS 5 MG TABS tablet TAKE ONE TABLET BY MOUTH TWICE DAILY 05/27/21   Croitoru, Mihai, MD  ?gabapentin (NEURONTIN) 300 MG capsule Take 300 mg by mouth at bedtime. 06/20/17   [provider]  ?hydrochlorothiazide  (HYDRODIURIL) 12.5 MG tablet Take 1 tablet (12.5 mg total) by mouth daily. TAKE 1 TABLET EACH DAY. 02/23/21   Croitoru, Mihai, MD  ?ketoconazole (NIZORAL) 2 % shampoo Apply topically. 02/19/21   [provider]  ?levothyroxine (SYNTHROID) 100 MCG tablet Take 100 mcg by mouth every morning. 01/27/21   [provider]  ?lisinopril (ZESTRIL) 40 MG tablet TAKE 1 TABLET EACH DAY. 07/04/20   Croitoru, Mihai, MD  ?rosuvastatin (CRESTOR) 20 MG tablet Take 1 tablet (20 mg total) by mouth daily. 02/23/21   Croitoru, Mihai, MD  ?Scar Treatment Products (STRATA TRIZ) GEL Apply topically 2 (two) times daily. to affected area(s) 02/20/21   [provider]  ? ? ?Exam: ?Current vital signs: ?BP (!) 184/91 (BP Location: Left Arm)   Pulse 60   Temp 97.7 ?F (36.5 ?C) (Oral)   Resp 18   Ht 5' 2.5" (1.588 m)   Wt 81.6 kg   SpO2 99%   BMI 32.40 kg/m?  ? ?Physical Exam  ?Constitutional: Appears well-developed and well-nourished.  ?Psych: Affect appropriate to situation ?Eyes: No scleral injection ?HENT: No OP obstruction. ?Head: Normocephalic.  ?Cardiovascular: Normal rate and regular rhythm.  ?Respiratory: Effort normal, symmetric excursions bilaterally, no audible wheezing. ?GI: Soft.  No distension. There is no tenderness.  ?Skin: WDI ? ?Neuro: ?Mental Status: ?Patient is awake, alert, oriented to person, place, month, year, and situation. ?Patient is able to give a clear and coherent history. ?Speech  fluent, intact comprehension and repetition. ?No signs of aphasia or neglect. ?Visual Fields are full. Pupils are equal, round, and reactive to light. ?EOMI without ptosis or diplopia.  ?Facial sensation is symmetric to temperature ?Facial movement is symmetric.  ?Hearing is intact to voice. ?Uvula midline and palate elevates symmetrically. ?Shoulder shrug is symmetric. ?Tongue is midline without atrophy or fasciculations.  ?Tone is normal. Bulk is normal. 5/5 strength was present in all four  extremities. ?Sensation is symmetric to light touch and temperature in the arms and legs. ?Deep Tendon Reflexes: 2+ and symmetric in the biceps and patellae. ?Toes are downgoing bilaterally. ?FNF and HKS are intact bilaterally. ?Gait - Deferred ? ?I have reviewed labs in epic and the pertinent results are: ?CBG 103 ? ?I have reviewed the images obtained: ?NCT head showed no acute ischemic changes, hemorrhage, mass. ?CTA head and neck showed No emergent finding or flow limiting stenosis. ? ?Assessment: Michele Thompson is a 81 y.o. female PMHx as noted above, PAF on apixaban with TIA.  She states she only had 1 event where she knew what she wanted to say but she just could not get the words out.  She has not missed any doses of apixaban. ? ?She does have a pacemaker and will need appropriate protocol for imaging. ? ?Impression:  ?TIA ?Atrial fibrillation ?On apixaban ?NIHSS 0 ?Hypertension-controlled ? ?  She would like to expedite the evaluation as she would not like to stay another night. ? ?Plan: ?- MRI brain without contrast-ordered. ?- Recommend TTE. ?- Recommend labs: HbA1c, lipid panel. ?- Recommend Statin for goal LDL <70. ?- Goal A1c <7. ?-Continue apixaban. ?- SBP goal <150. ?- Telemetry monitoring for arrhythmia. ?- Recommend bedside Swallow screen. ?- Recommend Stroke education. ?- Recommend PT/OT/SLP consult. ? ?Electronically signed by:  ?Lynnae Sandhoff, MD ?Page: [phone] ?06/29/2021, 2:29 AM ? ?If 7pm- 7am, please page neurology on call as listed in [facility]. ? ?

## 2021-06-29 NOTE — Assessment & Plan Note (Addendum)
Continue levothyroxine 100 mcg p.o. daily ?

## 2021-06-29 NOTE — Progress Notes (Addendum)
Programmed patient's [employer] device with the device representative, Joey.  Device put into MRI safe mode, DOO 75bpm.  Patient is being monitored by bedside nurse. Will program patient back to regular settings following scan. Representative in contact before and after setting device with the cardiology PA, Renee.   ? ?Order placed, "Change device settings for MRI to  ?DOO at 75 bpm  ?Program device back to pre-MRI settings after completion of exam, and send transmission." ?

## 2021-06-29 NOTE — Evaluation (Signed)
Speech Language Pathology Evaluation ?Patient Details ?Name: Michele Thompson ?MRN: [id number] ?DOB: Sep 03, 1940 ?Today's Date: 06/29/2021 ?Time: 7902-4097 ?SLP Time Calculation (min) (ACUTE ONLY): 23 min ? ?Problem List:  ?Patient Active Problem List  ? Diagnosis Date Noted  ? Blurred vision, bilateral 06/29/2021  ? TIA (transient ischemic attack) 06/28/2021  ? Tachycardia-bradycardia syndrome ([place]) 07/21/2020  ? Insulin resistance 05/16/2019  ? Class 1 obesity with serious comorbidity and body mass index (BMI) of 31.0 to 31.9 in adult 03/13/2019  ? History of pulmonary embolism 03/28/2018  ? Paroxysmal atrial fibrillation ([place]) 03/28/2018  ? Chronotropic incompetence with sinus node dysfunction (H[facility]) 05/26/2017  ? OA (osteoarthritis) of knee 12/27/2016  ? Pre-operative cardiovascular examination 10/26/2016  ? Chronic anticoagulation 10/26/2016  ? History of DVT (deep vein thrombosis) 04/23/2016  ? Coronary artery disease involving native coronary artery of native heart with angina pectoris with documented spasm ([place]) 03/31/2016  ? Sinus bradycardia 03/31/2016  ? NSTEMI (non-ST elevated myocardial infarction) ([place])   ? PE (pulmonary thromboembolism) ([place]) 03/17/2016  ? Hypothyroidism 03/17/2016  ? Rectal bleeding 03/17/2016  ? Headache 03/17/2016  ? Hypercholesterolemia 03/16/2016  ? Essential hypertension 03/16/2016  ? ?Past Medical History:  ?Past Medical History:  ?Diagnosis Date  ? Anxiety   ? Aortic atherosclerosis ([place])   ? Arthritis   ? Constipation   ? Coronary artery disease 03/16/2016  ? a. admx with angina and mildly elevated Troponin >> LHC: mLAD 30 >> CCB started for poss microvascular angina  ? Depression   ? Diverticulosis   ? Edema of both lower extremities   ? Fatty liver   ? History of MI (myocardial infarction)   ? Hyperlipidemia 03/16/2016  ? Hypertension   ? Hypothyroidism   ? Joint pain   ? Pacemaker   ? Pacemaker   ? Paroxysmal A-fib ([place])   ? PE (pulmonary thromboembolism) ([place]) 03/17/2016  ?  Bilateral submassive pulmonary embolus with RUE and RLE DVT on venous duplex 02/2016 c/b PEA arrest // Echo 03/21/16: mild LVH, EF 60-65, no RWMA, Gr 1 DD, normal RVSF  ? Psoas tendinitis   ? Sinus bradycardia 03/31/2016  ? Thrombocytopenia ([place]) 02/2016  ? Profound thrombocytopenia noted upon admission for pulmonary embolism >> initially thought to be from Hep induced thrombocytopenia given recent admit for cardiac cath and Heparin use // However, HIT Ab was neg (0.184) // Hematology consult pending  ? Thyroid disease   ? Unilateral primary osteoarthritis, right knee   ? Vascular malformation   ? mid ascending colon  ? Vitamin D deficiency   ? ?Past Surgical History:  ?Past Surgical History:  ?Procedure Laterality Date  ? CARDIAC CATHETERIZATION N/A 03/15/2016  ? Procedure: Left Heart Cath and Coronary Angiography;  Surgeon: [name], MD;  Location: [facility];  Service: Cardiovascular;  Laterality: N/A;  ? INSERT / REPLACE / REMOVE PACEMAKER    ? PACEMAKER IMPLANT N/A 07/21/2020  ? Procedure: PACEMAKER IMPLANT;  Surgeon: Sanda Klein, MD;  Location: [facility];  Service: Cardiovascular;  Laterality: N/A;  ? TOTAL KNEE ARTHROPLASTY Right 12/27/2016  ? Procedure: RIGHT TOTAL KNEE ARTHROPLASTY;  Surgeon: Gaynelle Arabian, MD;  Location: [facility];  Service: Orthopedics;  Laterality: Right;  ? VAGINAL HYSTERECTOMY  1983  ? endometriosis  ? ?HPI:  ?Michele Thompson is a 81 y.o. female with medical history significant of massive pulmonary embolism complicated by cardiac arrest while on estrogen therapy in 2017, paroxysmal atrial fibrillation s/p cardioversion in 2019 on Eliquis, CAD and  suspected coronary vasospasm, hypertension, hyperlipidemia who presents with concerns of vision changes and trouble word finding. CT neg for acute infarcts  ? ?Assessment / Plan / Recommendation ?Clinical Impression ? Pt scored 27/30 on the SLUMS, suggestive of cognitive-linguistic skills that are [facility]. She has no overt anomia  in conversation and her speech is clear. Subjectively, she believes that all of her symptoms have resolved. No further SLP needs identified at this time. ?   ?SLP Assessment ? SLP Recommendation/Assessment: Patient does not need any further [facility] ?SLP Visit Diagnosis: Cognitive communication deficit (R41.841)  ?  ?Recommendations for follow up therapy are one component of a multi-disciplinary discharge planning process, led by the attending physician.  Recommendations may be updated based on patient status, additional functional criteria and insurance authorization. ?   ?Follow Up Recommendations ? No SLP follow up  ?  ?Assistance Recommended at Discharge ? PRN  ?Functional Status Assessment Patient has not had a recent decline in their functional status  ?Frequency and Duration    ?  ?  ?   ?SLP Evaluation ?Cognition ? Overall Cognitive Status: Within Functional Limits for tasks assessed  ?  ?   ?Comprehension ? Auditory Comprehension ?Overall Auditory Comprehension: Appears within functional limits for tasks assessed  ?  ?Expression Expression ?Primary Mode of Expression: Verbal ?Verbal Expression ?Overall Verbal Expression: Appears within functional limits for tasks assessed   ?Oral / Motor ? Motor Speech ?Overall Motor Speech: Appears within functional limits for tasks assessed   ?        ? ?Osie Bond., M.A. CCC-SLP ?Acute Rehabilitation Services ?Pager [phone] ?Office [phone] ? ?06/29/2021, 4:38 PM ? ?

## 2021-06-29 NOTE — Progress Notes (Signed)
STROKE TEAM PROGRESS NOTE   INTERVAL HISTORY No family is at the bedside. She stated that yesterday she did not feel well all day, and she was dozing on and off in the chair. In the afternoon, she was sitting in chair, she had right eye on the right visual field distorted images, she kept the eye closed for 5-10 min and resolved. She reported mild HA at that time. About 71mn later, she was dozing off, her son called in and she picked up the phone talking to son, she can not remember the details of concert last Friday and she could not tell more about it. The ALF staff called by son and check her later and she was confused, could not explain what happened. She also had difficulty to get words out. [facility] [place] [facility] [place] [facility].  She stated that she typically has high BP with HA so she felt she had high BP yesterday. That is consistent with her BP high on presentation yesterday in ED.   Vitals:   06/28/21 2315 06/28/21 2345 06/29/21 0042 06/29/21 0324  BP: (!) 131/54 (!) 179/61 (!) 184/91 135/62  Pulse: 60 (!) 58 60 72  Resp: 14 (!) '24 18 16  '$ Temp:   97.7 F (36.5 C) 98.5 F (36.9 C)  TempSrc:   Oral Oral  SpO2: 95% 100% 99% 100%  Weight:      Height:       CBC:  Recent Labs  Lab 06/28/21 1846  WBC 5.5  NEUTROABS 3.1  HGB 12.8  HCT 38.7  MCV 94.4  PLT 1841  Basic Metabolic Panel:  Recent Labs  Lab 06/28/21 1846  NA 138  K 3.8  CL 103  CO2 28  GLUCOSE 114*  BUN 28*  CREATININE 0.83  CALCIUM 9.5   Lipid Panel:  Recent Labs  Lab 06/29/21 0243  CHOL 97  TRIG 41  HDL 50  CHOLHDL 1.9  VLDL 8  LDLCALC 39   HgbA1c: No results for input(s): HGBA1C in the last 168 hours. Urine Drug Screen: No results for input(s): LABOPIA, COCAINSCRNUR, LABBENZ, AMPHETMU, THCU, LABBARB in the last 168 hours.  Alcohol Level No results for input(s): ETH in the last 168 hours.  IMAGING past 24 hours CT ANGIO HEAD W OR WO CONTRAST  Result Date:  06/29/2021 CLINICAL DATA:  Blurred vision and dysarthria, stroke suspected EXAM: CT ANGIOGRAPHY HEAD TECHNIQUE: Multidetector CT imaging of the head was performed using the standard protocol during bolus administration of intravenous contrast. Multiplanar CT image reconstructions and MIPs were obtained to evaluate the vascular anatomy. RADIATION DOSE REDUCTION: This exam was performed according to the departmental dose-optimization program which includes automated exposure control, adjustment of the mA and/or kV according to patient size and/or use of iterative reconstruction technique. CONTRAST:  670mOMNIPAQUE IOHEXOL 350 MG/ML SOLN COMPARISON:  Head CT from yesterday FINDINGS: CT HEAD Brain: Stable. No evidence of acute infarction, hemorrhage, hydrocephalus, extra-axial collection or mass lesion/mass effect. Dilated perivascular space below the right putamen. Normal brain volume for age. Vascular: No hyperdense vessel or unexpected calcification. Skull: Normal. Negative for fracture or focal lesion. Sinuses: No acute or significant finding. CTA HEAD Anterior circulation: Mild atheromatous calcification along the carotid siphons. No branch occlusion, beading, flow limiting stenosis, or aneurysm. Posterior circulation: No branch occlusion, beading, flow limiting stenosis, or aneurysm. Venous sinuses: Diffusely patent Anatomic variants: Hypoplastic right P1 segment with large posterior communicating artery. Review of the MIP images confirms  the above findings. IMPRESSION: No emergent finding or flow limiting stenosis. Mild atherosclerosis for age. Electronically Signed   By: Jorje Guild M.D.   On: 06/29/2021 04:13   CT HEAD WO CONTRAST (5MM)  Result Date: 06/28/2021 CLINICAL DATA:  Transient ischemic attack. Loss of speech 1 hour ago. EXAM: CT HEAD WITHOUT CONTRAST TECHNIQUE: Contiguous axial images were obtained from the base of the skull through the vertex without intravenous contrast. RADIATION DOSE  REDUCTION: This exam was performed according to the departmental dose-optimization program which includes automated exposure control, adjustment of the mA and/or kV according to patient size and/or use of iterative reconstruction technique. COMPARISON:  None. FINDINGS: Brain: No evidence of acute infarction, hemorrhage, hydrocephalus, extra-axial collection or mass lesion/mass effect. Patchy low-attenuation changes in the deep white matter likely due to small vessel ischemia. Old lacunar infarct in the right basal ganglia. Vascular: Moderate intracranial arterial vascular calcifications. Skull: Normal. Negative for fracture or focal lesion. Sinuses/Orbits: Paranasal sinuses and mastoid air cells are clear. Other: None. IMPRESSION: No acute intracranial abnormalities. Mild chronic small vessel ischemic changes. Electronically Signed   By: Lucienne Capers M.D.   On: 06/28/2021 19:56    PHYSICAL EXAM  Physical Exam  Constitutional: Appears well-developed and well-nourished.  Cardiovascular: Normal rate and regular rhythm.  Respiratory: Effort normal, non-labored breathing  Neuro: Mental Status: Patient is awake, alert, oriented to person, place, month, year, and situation. Patient is able to give a clear and coherent history. No signs of aphasia or neglect Cranial Nerves: II: Visual Fields are full. Pupils are equal, round, and reactive to light.   III,IV, VI: EOMI without ptosis or diploplia.  V: Facial sensation is symmetric to temperature VII: Facial movement is symmetric resting and smiling VIII: Hearing is intact to voice X: Palate elevates symmetrically XI: Shoulder shrug is symmetric. XII: Tongue protrudes midline without atrophy or fasciculations.  Motor: Tone is normal. Bulk is normal. 5/5 strength was present in all four extremities.  Sensory: Sensation is symmetric to light touch and temperature in the arms and legs. No extinction to DSS present.  Deep Tendon Reflexes: 2+ and  symmetric in the biceps and patellae.  Plantars: Toes are downgoing bilaterally.  Cerebellar: FNF and HKS are intact bilaterally    ASSESSMENT/PLAN Michele Thompson is a 81 y.o. female with history of massive pulmonary embolism complicated by cardiac arrest while on estrogen therapy in 2017, paroxysmal atrial fibrillation s/p cardioversion in 2019 on Eliquis, CAD and suspected coronary vasospasm, hypertension, hyperlipidemia presenting with blurred vision and dysarthria that have since resolved.  CT head did show an old lacunar infarct in the right basal ganglia.  Likely hypertensive encephalopathy, less likely TIA Code Stroke CT head No acute abnormality. ASPECTS 10.    CTA head no emergent finding or flow-limiting stenosis, mild atherosclerosis MRI  only DWI sequence done and no acute infarct Carotid Doppler unremarkable 2D Echo EF 60-65% LDL 39 HgbA1c pending VTE prophylaxis - eliquis Eliquis (apixaban) daily prior to admission, now on Eliquis (apixaban) daily. Continue on discharge Therapy recommendations:  none Disposition:  home today  Atrial Fibrillation Home medications: Diltiazem '30mg'$  TID PRN palpitations On Eliquis '5mg'$  BID Continue on discharge  Hypertensive encephalopathy High on presentation After more symptom gathering, her symptoms more like confusion, memory difficulty and speech difficulty, more consistent with encephalopathy in the setting of HTN Stable now Long-term BP goal normotensive  Hyperlipidemia Home meds:  rosuvastatin '20mg'$ , resumed in hospital LDL 39, goal < 70 Continue statin at  discharge  Other Stroke Risk Factors Advanced Age >/= 12  Obesity, Body mass index is 32.4 kg/m., BMI >/= 30 associated with increased stroke risk, recommend weight loss, diet and exercise as appropriate  Hx stroke/TIA Imaging shows previous lacunar infarct in the right basal ganglia Coronary artery disease Massive PE and then cardiac arrest in 2017 - on eliquis  PTA  Other Active Problems Hypothyroidism Tachycardia-bradycardia syndrome S/p pacemaker placement  Hospital day # 0  Neurology will sign off. Please call with questions. Pt will follow up with stroke clinic NP at [facility] in about 4 weeks. Thanks for the consult.  Rosalin Hawking, MD PhD Stroke Neurology 06/29/2021 3:04 PM   To contact Stroke Continuity provider, please refer to [email or web address] After hours, contact General Neurology

## 2021-06-29 NOTE — Plan of Care (Signed)
?  Problem: Health Behavior/Discharge Planning: ?Goal: Ability to manage health-related needs will improve ?Outcome: Adequate for Discharge ?  ?Problem: Education: ?Goal: Knowledge of disease or condition will improve ?Outcome: Adequate for Discharge ?Goal: Knowledge of secondary prevention will improve (SELECT ALL) ?Outcome: Adequate for Discharge ?Goal: Knowledge of patient specific risk factors will improve (INDIVIDUALIZE FOR PATIENT) ?Outcome: Adequate for Discharge ?  ?Problem: Education: ?Goal: Knowledge of General Education information will improve ?Description: Including pain rating scale, medication(s)/side effects and non-pharmacologic comfort measures ?Outcome: Adequate for Discharge ?  ?

## 2021-06-30 ENCOUNTER — Telehealth: Payer: Self-pay | Admitting: Cardiovascular Disease

## 2021-06-30 NOTE — Telephone Encounter (Signed)
[id number] 

## 2021-06-30 NOTE — Telephone Encounter (Signed)
?  Per MyChart scheduling message: ? ?My hospital stay at [facility] due to TIA.  June 29, 2021. ?Also, I was order to never use ditiazem 30 mg tablet.  I need to know why and if there is a replacement should my heart go into afib. ?

## 2021-07-01 NOTE — Telephone Encounter (Signed)
Left message for pt to call.

## 2021-07-06 ENCOUNTER — Encounter: Payer: Self-pay | Admitting: Cardiovascular Disease

## 2021-07-06 DIAGNOSIS — I480 Paroxysmal atrial fibrillation: Secondary | ICD-10-CM | POA: Diagnosis not present

## 2021-07-06 DIAGNOSIS — I10 Essential (primary) hypertension: Secondary | ICD-10-CM | POA: Diagnosis not present

## 2021-07-06 DIAGNOSIS — G459 Transient cerebral ischemic attack, unspecified: Secondary | ICD-10-CM | POA: Diagnosis not present

## 2021-07-06 DIAGNOSIS — E7800 Pure hypercholesterolemia, unspecified: Secondary | ICD-10-CM | POA: Diagnosis not present

## 2021-07-09 ENCOUNTER — Other Ambulatory Visit: Payer: Self-pay | Admitting: Cardiovascular Disease

## 2021-07-09 NOTE — Telephone Encounter (Signed)
See my chart message from 07/06/21. ?

## 2021-07-20 ENCOUNTER — Emergency Department (HOSPITAL_COMMUNITY)
Admission: EM | Admit: 2021-07-20 | Discharge: 2021-07-21 | Disposition: A | Discharge status: Home / Self Care | Payer: PPO | Attending: Emergency Medicine | Admitting: Emergency Medicine

## 2021-07-20 ENCOUNTER — Emergency Department (HOSPITAL_COMMUNITY): Payer: PPO

## 2021-07-20 ENCOUNTER — Encounter (HOSPITAL_COMMUNITY): Payer: Self-pay

## 2021-07-20 DIAGNOSIS — I2510 Atherosclerotic heart disease of native coronary artery without angina pectoris: Secondary | ICD-10-CM | POA: Insufficient documentation

## 2021-07-20 DIAGNOSIS — G459 Transient cerebral ischemic attack, unspecified: Secondary | ICD-10-CM | POA: Diagnosis not present

## 2021-07-20 DIAGNOSIS — I4891 Unspecified atrial fibrillation: Secondary | ICD-10-CM | POA: Diagnosis not present

## 2021-07-20 DIAGNOSIS — E039 Hypothyroidism, unspecified: Secondary | ICD-10-CM | POA: Insufficient documentation

## 2021-07-20 DIAGNOSIS — Z7901 Long term (current) use of anticoagulants: Secondary | ICD-10-CM | POA: Diagnosis not present

## 2021-07-20 DIAGNOSIS — I10 Essential (primary) hypertension: Secondary | ICD-10-CM | POA: Diagnosis not present

## 2021-07-20 DIAGNOSIS — R002 Palpitations: Secondary | ICD-10-CM | POA: Diagnosis present

## 2021-07-20 DIAGNOSIS — I499 Cardiac arrhythmia, unspecified: Secondary | ICD-10-CM | POA: Diagnosis not present

## 2021-07-20 DIAGNOSIS — R000 Tachycardia, unspecified: Secondary | ICD-10-CM | POA: Diagnosis not present

## 2021-07-20 DIAGNOSIS — Z950 Presence of cardiac pacemaker: Secondary | ICD-10-CM | POA: Diagnosis not present

## 2021-07-20 DIAGNOSIS — Z79899 Other long term (current) drug therapy: Secondary | ICD-10-CM | POA: Diagnosis not present

## 2021-07-20 DIAGNOSIS — I959 Hypotension, unspecified: Secondary | ICD-10-CM | POA: Diagnosis not present

## 2021-07-20 LAB — CBC
HCT: 37.6 % (ref 36.0–46.0)
Hemoglobin: 12.3 g/dL (ref 12.0–15.0)
MCH: 31.5 pg (ref 26.0–34.0)
MCHC: 32.7 g/dL (ref 30.0–36.0)
MCV: 96.2 fL (ref 80.0–100.0)
Platelets: 152 10*3/uL (ref 150–400)
RBC: 3.91 MIL/uL (ref 3.87–5.11)
RDW: 12.2 % (ref 11.5–15.5)
WBC: 6.9 10*3/uL (ref 4.0–10.5)
nRBC: 0 % (ref 0.0–0.2)

## 2021-07-20 LAB — BASIC METABOLIC PANEL
Anion gap: 8 (ref 5–15)
BUN: 19 mg/dL (ref 8–23)
CO2: 24 mmol/L (ref 22–32)
Calcium: 8.7 mg/dL — ABNORMAL LOW (ref 8.9–10.3)
Chloride: 107 mmol/L (ref 98–111)
Creatinine, Ser: 0.86 mg/dL (ref 0.44–1.00)
GFR, Estimated: >60 mL/min (ref >60)
Glucose, Bld: 110 mg/dL — ABNORMAL HIGH (ref 70–99)
Potassium: 3.9 mmol/L (ref 3.5–5.1)
Sodium: 139 mmol/L (ref 135–145)

## 2021-07-20 LAB — MAGNESIUM: Magnesium: 2.1 mg/dL (ref 1.7–2.4)

## 2021-07-20 MED ORDER — ETOMIDATE 2 MG/ML IV SOLN
10.0000 mg | Freq: Once | INTRAVENOUS | Status: AC
  Administered 2021-07-21: 10 mg via INTRAVENOUS
  Filled 2021-07-20: qty 10

## 2021-07-20 NOTE — ED Provider Notes (Signed)
?[place] ?Provider Note ? ? ?CSN: [id number] ?Arrival date & time: 07/20/21  2250 ? ?  ? ?History ? ?Chief Complaint  ?Patient presents with  ? Atrial Fibrillation  ? ? ?Michele Thompson is a 81 y.o. female. ? ?HPI ? ?  ? ?This is an 81 year old female with a history of paroxysmal atrial fibrillation and pulmonary embolism on Eliquis, sick sinus syndrome status post pacemaker who presents with palpitations.  Patient reports that she has episodes of going in and out of atrial fibrillation.  Normally they do not last more than a few minutes at a time.  However, tonight she had onset of her normal symptoms when she goes into A-fib.  She describes a fullness in her throat and jaw.  She also describes some palpitations.  Tonight she took 30 mg of Cardizem without relief.  She called EMS who also gave her an additional 10 mg of Cardizem.  She currently is endorsing persistent feeling in her throat as well as some shortness of breath.  No recent illnesses.  No cough or fevers.  She states that she has been compliant with her Eliquis and has not missed a dose in the last 30 days ? ?Home Medications ?Prior to Admission medications   ?Medication Sig Start Date End Date Taking? Authorizing Provider  ?Cholecalciferol (VITAMIN D3) 2000 units TABS Take 2,000 Units by mouth daily.    [provider]  ?ELIQUIS 5 MG TABS tablet TAKE ONE TABLET BY MOUTH TWICE DAILY ?Patient taking differently: Take 5 mg by mouth 2 (two) times daily. 05/27/21   Croitoru, Mihai, MD  ?gabapentin (NEURONTIN) 300 MG capsule Take 300 mg by mouth at bedtime. 06/20/17   [provider]  ?hydrochlorothiazide (HYDRODIURIL) 12.5 MG tablet Take 1 tablet (12.5 mg total) by mouth daily. TAKE 1 TABLET EACH DAY. ?Patient taking differently: Take 12.5 mg by mouth daily. 02/23/21   Croitoru, Mihai, MD  ?levothyroxine (SYNTHROID) 100 MCG tablet Take 100 mcg by mouth every morning. 01/27/21   [provider]   ?lisinopril (ZESTRIL) 40 MG tablet TAKE ONE TABLET BY MOUTH EVERY DAY 07/09/21   Croitoru, Mihai, MD  ?rosuvastatin (CRESTOR) 20 MG tablet Take 1 tablet (20 mg total) by mouth daily. ?Patient taking differently: Take 20 mg by mouth every evening. 02/23/21   Croitoru, Dani Gobble, MD  ?   ? ?Allergies    ?Patient has no known allergies.   ? ?Review of Systems   ?Review of Systems  ?Constitutional:  Negative for fever.  ?Respiratory:  Positive for shortness of breath. Negative for chest tightness.   ?Cardiovascular:  Positive for palpitations.  ?All other systems reviewed and are negative. ? ?Physical Exam ?Updated Vital Signs ?BP 90/77   Pulse 60   Temp 98.4 ?F (36.9 ?C) (Oral)   Resp 16   Ht 1.575 m ('5\' 2"'$ )   Wt 81.6 kg   SpO2 97%   BMI 32.92 kg/m?  ?Physical Exam ?Vitals and nursing note reviewed.  ?Constitutional:   ?   Appearance: She is well-developed. She is obese. She is not ill-appearing.  ?HENT:  ?   Head: Normocephalic and atraumatic.  ?Eyes:  ?   Pupils: Pupils are equal, round, and reactive to light.  ?Cardiovascular:  ?   Rate and Rhythm: Tachycardia present. Rhythm irregular.  ?   Heart sounds: Normal heart sounds.  ?Pulmonary:  ?   Effort: Pulmonary effort is normal. No respiratory distress.  ?   Breath sounds: No  wheezing.  ?Abdominal:  ?   Palpations: Abdomen is soft.  ?Musculoskeletal:  ?   Cervical back: Neck supple.  ?   Comments: Trace bilateral lower extremity edema  ?Skin: ?   General: Skin is warm and dry.  ?Neurological:  ?   Mental Status: She is alert and oriented to person, place, and time.  ?Psychiatric:     ?   Mood and Affect: Mood normal.  ? ? ?ED Results / Procedures / Treatments   ?Labs ?(all labs ordered are listed, but only abnormal results are displayed) ?Labs Reviewed  ?BASIC METABOLIC PANEL - Abnormal; Notable for the following components:  ?    Result Value  ? Glucose, Bld 110 (*)   ? Calcium 8.7 (*)   ? All other components within normal limits  ?CBC  ?MAGNESIUM  ? ? ?EKG ?EKG  Interpretation ? ?Date/Time:  Tuesday July 21 2021 00:27:59 EDT ?Ventricular Rate:  76 ?PR Interval:  164 ?QRS Duration: 85 ?QT Interval:  383 ?QTC Calculation: 431 ?R Axis:   7 ?Text Interpretation: Sinus rhythm Low voltage, precordial leads Abnormal R-wave progression, early transition Confirmed by Thayer Jew [phone]) on 07/21/2021 12:32:27 AM ? ?Radiology ?DG Chest 2 View ? ?Result Date: 07/20/2021 ?CLINICAL DATA:  Atrial fibrillation EXAM: CHEST - 2 VIEW COMPARISON:  07/22/2020 FINDINGS: Left pacer remains in place, unchanged. Heart and mediastinal contours are within normal limits. No focal opacities or effusions. No acute bony abnormality. IMPRESSION: No active cardiopulmonary disease. Electronically Signed   By: Rolm Baptise M.D.   On: 07/20/2021 23:20   ? ?Procedures ?.Cardioversion ? ?Date/Time: 07/21/2021 12:32 AM ?Performed by: Merryl Hacker, MD ?Authorized by: Merryl Hacker, MD  ? ?Consent:  ?  Consent obtained:  Written ?  Consent given by:  Patient ?  Risks discussed:  Induced arrhythmia and pain ?  Alternatives discussed:  No treatment ?Pre-procedure details:  ?  Cardioversion basis:  Emergent ?  Rhythm:  Atrial fibrillation ?  Electrode placement:  Anterior-posterior ?Patient sedated: Yes. Refer to sedation procedure documentation for details of sedation. ? ?Attempt one:  ?  Cardioversion mode:  Synchronous ?  Shock (Joules):  200 ?  Shock outcome:  Conversion to normal sinus rhythm ?Post-procedure details:  ?  Patient status:  Awake ?  Patient tolerance of procedure:  Tolerated well, no immediate complications ?.Sedation ? ?Date/Time: 07/21/2021 12:33 AM ?Performed by: Merryl Hacker, MD ?Authorized by: Merryl Hacker, MD  ? ?Consent:  ?  Consent obtained:  Written ?  Risks discussed:  Allergic reaction, dysrhythmia, prolonged sedation necessitating reversal and inadequate sedation ?Universal protocol:  ?  Immediately prior to procedure, a time out was called: yes   ?Indications:  ?   Procedure performed:  Cardioversion ?  Procedure necessitating sedation performed by:  Physician performing sedation ?Pre-sedation assessment:  ?  Time since last food or drink:  2000 ?  ASA classification: class 3 - patient with severe systemic disease   ?  Mouth opening:  2 finger widths ?  Mallampati score:  II - soft palate, uvula, fauces visible ?  Neck mobility: normal   ?  Pre-sedation assessments completed and reviewed: airway patency, cardiovascular function, hydration status, mental status, nausea/vomiting and pain level   ?  Pre-sedation assessment completed:  07/21/2021 12:01 AM ?Immediate pre-procedure details:  ?  Reassessment: Patient reassessed immediately prior to procedure   ?  Reviewed: vital signs, relevant labs/tests and NPO status   ?  Verified: bag valve  mask available, emergency equipment available, intubation equipment available and IV patency confirmed   ?Procedure details (see MAR for exact dosages):  ?  Preoxygenation:  Room air ?  Sedation:  Etomidate ?  Intra-procedure monitoring:  Blood pressure monitoring, continuous capnometry, continuous pulse oximetry and cardiac monitor ?  Intra-procedure events: none   ?  Total Provider sedation time (minutes):  10 ?Post-procedure details:  ?  Post-sedation assessment completed:  07/21/2021 12:35 AM ?  Attendance: Constant attendance by certified staff until patient recovered   ?  Recovery: Patient returned to pre-procedure baseline   ?  Patient is stable for discharge or admission: yes   ?  Procedure completion:  Tolerated well, no immediate complications ?[facility] ?Performed by: Merryl Hacker, MD ?Authorized by: Merryl Hacker, MD  ? ?Critical care provider statement:  ?  Critical care time (minutes):  35 ?  Critical care was necessary to treat or prevent imminent or life-threatening deterioration of the following conditions: arrythmia. ?  Critical care was time spent personally by me on the following activities:  Development of  treatment plan with patient or surrogate, discussions with consultants, evaluation of patient's response to treatment, examination of patient, ordering and review of laboratory studies, ordering and review of radi

## 2021-07-20 NOTE — Sedation Documentation (Signed)
Paper consent signed by patient 

## 2021-07-20 NOTE — ED Triage Notes (Signed)
[facility], pt in afib RVR with a rate of 150-170 took '30mg'$  of Cardizem at around 8pm, then EMS gave '10mg'$  additional IV, now rate is 80-140. Some SOB  ?

## 2021-07-21 ENCOUNTER — Encounter: Payer: Self-pay | Admitting: Cardiovascular Disease

## 2021-07-21 NOTE — Sedation Documentation (Signed)
Pt in paced sinus rhythm  ?

## 2021-07-21 NOTE — Discharge Instructions (Signed)
You were seen today for atrial fibrillation.  You were cardioverted in the emergency department.  Continue your medications especially your Eliquis.  Follow-up with Dr. Loletha Grayer regarding changes in any additional medications.  Call the office for next available appointment. ?

## 2021-07-21 NOTE — Sedation Documentation (Signed)
Placed on 2L ?

## 2021-07-21 NOTE — Sedation Documentation (Signed)
Pt is sinus rhythm  ?

## 2021-07-21 NOTE — Sedation Documentation (Signed)
Pt taken off oxygen.

## 2021-07-21 NOTE — ED Notes (Signed)
PT ambulatory to bathroom with no difficulty noted ?

## 2021-07-21 NOTE — ED Notes (Signed)
[facility], pt prefers to take cab home, pt given cab voucher home, pt has key to get into gate and house. AxO x 4, [facility] called and notified to watch for pt to enter threw gate.  ?

## 2021-07-21 NOTE — Sedation Documentation (Signed)
Pt shocked at 200 joules, synchronized  ?

## 2021-07-22 ENCOUNTER — Encounter: Payer: Self-pay | Admitting: Physician Assistant

## 2021-07-22 ENCOUNTER — Ambulatory Visit (INDEPENDENT_AMBULATORY_CARE_PROVIDER_SITE_OTHER): Payer: PPO | Admitting: Physician Assistant

## 2021-07-22 VITALS — BP 130/68 | HR 84 | Resp 20 | Ht 62.0 in | Wt 191.0 lb

## 2021-07-22 DIAGNOSIS — I2782 Chronic pulmonary embolism: Secondary | ICD-10-CM | POA: Diagnosis not present

## 2021-07-22 DIAGNOSIS — Z950 Presence of cardiac pacemaker: Secondary | ICD-10-CM | POA: Diagnosis not present

## 2021-07-22 DIAGNOSIS — I480 Paroxysmal atrial fibrillation: Secondary | ICD-10-CM

## 2021-07-22 DIAGNOSIS — E785 Hyperlipidemia, unspecified: Secondary | ICD-10-CM | POA: Diagnosis not present

## 2021-07-22 DIAGNOSIS — I10 Essential (primary) hypertension: Secondary | ICD-10-CM | POA: Diagnosis not present

## 2021-07-22 NOTE — Progress Notes (Signed)
?Cardiology Office Note:   ? ?Date:  07/24/2021  ? ?ID:  Michele Thompson, DOB 04-04-1941, MRN [id number] ? ?PCP:  Kathyrn Lass, MD ?  ?[facility] Providers ?Cardiologist:  Sanda Klein, MD    ? ?Referring MD: Kathyrn Lass, MD  ? ?Chief Complaint  ?Patient presents with  ? Follow-up  ?  Seen for Croitoru  ? ? ?History of Present Illness:   ? ?Michele Thompson is a 81 y.o. female with a hx of massive PE complicated by cardiorespiratory arrest while on estrogen therapy in 2017, PAF suspicion for coronary vasospasm on previous angiography, hypertension and hyperlipidemia.  She requires lifelong anticoagulation therapy for both history of PE and A-fib.  Patient previously underwent cardioversion again August 2019.  Heart monitor showed evidence of sinus node dysfunction with chronotropic incompetence while taking beta-blocker.  She had normal LV systolic function on echo and normal pulmonary function test in 2018.  Patient underwent implantation of  [employer] Jude dual-chamber permanent pacemaker in April 2022.  Patient was last seen by Dr. Sallyanne Kuster in November 2022 at which time she was doing well.  Overall A-fib burden was less than 1% on device interrogation.  She does occasionally have palpitation that last about an hour and a she appears to have symptoms associated with it, therefore she has been asked to take diltiazem whenever she has palpitation. ? ?More recently, patient was admitted in early March 2023 with vision changes and trouble with word finding concerning for TIA.  By the time she presented to outside ED, all of her symptom has resolved.  CTA of the head negative for acute finding but did show old lacunar infarct in the right basal ganglia.  She was hypertensive with blood pressure in the 180s.  Patient was transferred to [facility] for further work-up and neurology consult.  Echocardiogram obtained during the hospital shows EF 60 to 65%, no regional wall motion abnormality, trivial MR.  Carotid  ultrasound was near normal with only minimal wall thickening.  MRI shows no acute or subacute infarct.  She was continued on Eliquis.  Neurology service felt that her presenting symptom was more consistent with hypertensive encephalopathy rather than TIA.  Since discharge, patient was seen in the emergency room 2 days ago describing episodes of palpitations consistent with her previous A-fib.  She took 30 mg of short acting Cardizem without relief and called EMS.  EMS gave her additional 10 mg of Cardizem.  Since she has been compliant with her anticoagulation therapy, she underwent cardioversion in the emergency room and was released to follow-up with cardiology service. ? ?She presents today for cardiology follow-up.  She has multiple questions.  Fortunately, this is the first time she required a cardioversion since 2019.  We discussed possibility of keep using the as needed dose of short acting Cardizem versus placing her on a scheduled long-acting Cardizem, placing her on the schedule Cardizem likely will increase the percentage of pacing which drains her battery quicker, given the rarity of prolonged A-fib, I think it would be quite reasonable to keep her on as needed dose of short acting Cardizem.  If after taking the first Cardizem, she is still in atrial fibrillation, I asked her to check her blood pressure and if systolic blood pressure is greater than 100, she is okay to take additional dose of Cardizem.  Otherwise, she denies any recent chest pain or worsening dyspnea.  She can follow-up with Dr. Sallyanne Kuster in 3 months. ? ?Past Medical History:  ?  Diagnosis Date  ? Anxiety   ? Aortic atherosclerosis ([place])   ? Arthritis   ? Constipation   ? Coronary artery disease 03/16/2016  ? a. admx with angina and mildly elevated Troponin >> LHC: mLAD 30 >> CCB started for poss microvascular angina  ? Depression   ? Diverticulosis   ? Edema of both lower extremities   ? Fatty liver   ? History of MI (myocardial  infarction)   ? Hyperlipidemia 03/16/2016  ? Hypertension   ? Hypothyroidism   ? Joint pain   ? Pacemaker   ? Pacemaker   ? Paroxysmal A-fib ([place])   ? PE (pulmonary thromboembolism) ([place]) 03/17/2016  ? Bilateral submassive pulmonary embolus with RUE and RLE DVT on venous duplex 02/2016 c/b PEA arrest // Echo 03/21/16: mild LVH, EF 60-65, no RWMA, Gr 1 DD, normal RVSF  ? Psoas tendinitis   ? Sinus bradycardia 03/31/2016  ? Thrombocytopenia ([place]) 02/2016  ? Profound thrombocytopenia noted upon admission for pulmonary embolism >> initially thought to be from Hep induced thrombocytopenia given recent admit for cardiac cath and Heparin use // However, HIT Ab was neg (0.184) // Hematology consult pending  ? Thyroid disease   ? Unilateral primary osteoarthritis, right knee   ? Vascular malformation   ? mid ascending colon  ? Vitamin D deficiency   ? ? ?Past Surgical History:  ?Procedure Laterality Date  ? CARDIAC CATHETERIZATION N/A 03/15/2016  ? Procedure: Left Heart Cath and Coronary Angiography;  Surgeon: [name], MD;  Location: [facility];  Service: Cardiovascular;  Laterality: N/A;  ? INSERT / REPLACE / REMOVE PACEMAKER    ? PACEMAKER IMPLANT N/A 07/21/2020  ? Procedure: PACEMAKER IMPLANT;  Surgeon: Sanda Klein, MD;  Location: [facility];  Service: Cardiovascular;  Laterality: N/A;  ? TOTAL KNEE ARTHROPLASTY Right 12/27/2016  ? Procedure: RIGHT TOTAL KNEE ARTHROPLASTY;  Surgeon: Gaynelle Arabian, MD;  Location: [facility];  Service: Orthopedics;  Laterality: Right;  ? VAGINAL HYSTERECTOMY  1983  ? endometriosis  ? ? ?Current Medications: ?Current Meds  ?Medication Sig  ? Blood Pressure Monitoring (OMRON 7 SERIES BP MONITOR) DEVI See admin instructions.  ? Cholecalciferol (VITAMIN D3) 2000 units TABS Take 2,000 Units by mouth daily.  ? diltiazem (CARDIZEM) 30 MG tablet Take 30 mg by mouth 3 (three) times daily as needed.  ? ELIQUIS 5 MG TABS tablet TAKE ONE TABLET BY MOUTH TWICE DAILY (Patient taking  differently: Take 5 mg by mouth 2 (two) times daily.)  ? gabapentin (NEURONTIN) 300 MG capsule Take 300 mg by mouth at bedtime.  ? hydrochlorothiazide (HYDRODIURIL) 12.5 MG tablet Take 1 tablet (12.5 mg total) by mouth daily. TAKE 1 TABLET EACH DAY. (Patient taking differently: Take 12.5 mg by mouth daily.)  ? levothyroxine (SYNTHROID) 100 MCG tablet Take 100 mcg by mouth every morning.  ? lisinopril (ZESTRIL) 40 MG tablet TAKE ONE TABLET BY MOUTH EVERY DAY  ? rosuvastatin (CRESTOR) 20 MG tablet Take 1 tablet (20 mg total) by mouth daily. (Patient taking differently: Take 20 mg by mouth every evening.)  ?  ? ?Allergies:   Patient has no known allergies.  ? ?Social History  ? ?Socioeconomic History  ? Marital status: Widowed  ?  Spouse name: Not on file  ? Number of children: Not on file  ? Years of education: Not on file  ? Highest education level: Not on file  ?Occupational History  ? Occupation: retired  ?Tobacco Use  ? Smoking status:  Never  ?  Passive exposure: Never  ? Smokeless tobacco: Never  ?Vaping Use  ? Vaping Use: Never used  ?Substance and Sexual Activity  ? Alcohol use: No  ?  Alcohol/week: 0.0 standard drinks  ? Drug use: No  ? Sexual activity: Never  ?  Birth control/protection: Surgical  ?  Comment: 1st intercourse 81 yo-Fewer than 5 partners  ?Other Topics Concern  ? Not on file  ?Social History Narrative  ? [facility]:  ? Originally from [place]. Moved to [place] in 2012. 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  ? ?Social Determinants of Health  ? ?Financial Resource Strain: Not on file  ?Food Insecurity: Not on file  ?Transportation Needs: Not on file  ?Physical Activity: Not on file  ?Stress: Not on file  ?Social Connections: Not on file  ?  ? ?Family History: ?The patient's family history includes Heart disease in her father and mother; Heart failure in her father, maternal grandmother, and mother; High blood pressure in her father;  Myasthenia gravis in her brother; Stroke in her brother and father. There is no history of Clotting disorder or Colon cancer. ? ?ROS:   ?Please see the history of present illnes.    ? All other systems reviewed and are

## 2021-07-22 NOTE — Patient Instructions (Addendum)
Medication Instructions:  ?Diltiazem take a dose when in Afib. Check Blood Pressure and if your systolic Blood Pressure (top number) is greater than 100, take a second dose. Please call our office if needed.  ?*If you need a refill on your cardiac medications before your next appointment, please call your pharmacy* ? ? ?Lab Work: ?NONE ordered at this time of appointment  ? ?If you have labs (blood work) drawn today and your tests are completely normal, you will receive your results only by: ?MyChart Message (if you have MyChart) OR ?A paper copy in the mail ?If you have any lab test that is abnormal or we need to change your treatment, we will call you to review the results. ? ? ?Testing/Procedures: ?NONE ordered at this time of appointment  ? ? ? ?Follow-Up: ?At [facility], you and your health needs are our priority.  As part of our continuing mission to provide you with exceptional heart care, we have created designated Provider Care Teams.  These Care Teams include your primary Cardiologist (physician) and Advanced Practice Providers (APPs -  Physician Assistants and Nurse Practitioners) who all work together to provide you with the care you need, when you need it. ? ?We recommend signing up for the patient portal called "MyChart".  Sign up information is provided on this After Visit Summary.  MyChart is used to connect with patients for Virtual Visits (Telemedicine).  Patients are able to view lab/test results, encounter notes, upcoming appointments, etc.  Non-urgent messages can be sent to your provider as well.   ?To learn more about what you can do with MyChart, go to [email or web address].   ? ?Your next appointment:   ?3 month(s) ? ?The format for your next appointment:   ?In Person ? ?Provider:   ?Sanda Klein, MD   ? ? ?Other Instructions ? ?

## 2021-07-24 ENCOUNTER — Encounter: Payer: Self-pay | Admitting: Physician Assistant

## 2021-07-28 DIAGNOSIS — D126 Benign neoplasm of colon, unspecified: Secondary | ICD-10-CM | POA: Diagnosis not present

## 2021-07-28 DIAGNOSIS — E785 Hyperlipidemia, unspecified: Secondary | ICD-10-CM | POA: Diagnosis not present

## 2021-07-28 DIAGNOSIS — Z86711 Personal history of pulmonary embolism: Secondary | ICD-10-CM | POA: Diagnosis not present

## 2021-07-28 DIAGNOSIS — E890 Postprocedural hypothyroidism: Secondary | ICD-10-CM | POA: Diagnosis not present

## 2021-07-28 DIAGNOSIS — I4891 Unspecified atrial fibrillation: Secondary | ICD-10-CM | POA: Diagnosis not present

## 2021-07-28 DIAGNOSIS — I700 Atherosclerosis of aorta: Secondary | ICD-10-CM | POA: Diagnosis not present

## 2021-07-28 DIAGNOSIS — I2510 Atherosclerotic heart disease of native coronary artery without angina pectoris: Secondary | ICD-10-CM | POA: Diagnosis not present

## 2021-07-28 DIAGNOSIS — I674 Hypertensive encephalopathy: Secondary | ICD-10-CM | POA: Diagnosis not present

## 2021-07-28 DIAGNOSIS — Z8673 Personal history of transient ischemic attack (TIA), and cerebral infarction without residual deficits: Secondary | ICD-10-CM | POA: Diagnosis not present

## 2021-07-28 DIAGNOSIS — I10 Essential (primary) hypertension: Secondary | ICD-10-CM | POA: Diagnosis not present

## 2021-07-28 DIAGNOSIS — K760 Fatty (change of) liver, not elsewhere classified: Secondary | ICD-10-CM | POA: Diagnosis not present

## 2021-07-28 DIAGNOSIS — Z7901 Long term (current) use of anticoagulants: Secondary | ICD-10-CM | POA: Diagnosis not present

## 2021-08-04 ENCOUNTER — Ambulatory Visit (INDEPENDENT_AMBULATORY_CARE_PROVIDER_SITE_OTHER): Payer: PPO

## 2021-08-04 DIAGNOSIS — I495 Sick sinus syndrome: Secondary | ICD-10-CM

## 2021-08-04 LAB — CUP PACEART REMOTE DEVICE CHECK
Battery Remaining Longevity: 144 mo
Battery Remaining Percentage: 100 %
Brady Statistic RA Percent Paced: 61 %
Brady Statistic RV Percent Paced: 0 %
Date Time Interrogation Session: 20230418020200
Implantable Lead Implant Date: 20220406
Implantable Lead Implant Date: 20220406
Implantable Lead Location: 753859
Implantable Lead Location: 753860
Implantable Lead Model: 7840
Implantable Lead Model: 7841
Implantable Lead Serial Number: 1037262
Implantable Lead Serial Number: 1127954
Implantable Pulse Generator Implant Date: 20220406
Lead Channel Impedance Value: 684 Ohm
Lead Channel Impedance Value: 815 Ohm
Lead Channel Pacing Threshold Amplitude: 0.5 V
Lead Channel Pacing Threshold Amplitude: 0.7 V
Lead Channel Pacing Threshold Pulse Width: 0.4 ms
Lead Channel Pacing Threshold Pulse Width: 0.4 ms
Lead Channel Setting Pacing Amplitude: 3.5 V
Lead Channel Setting Pacing Amplitude: 3.5 V
Lead Channel Setting Pacing Pulse Width: 0.4 ms
Lead Channel Setting Sensing Sensitivity: 2.5 mV
Pulse Gen Serial Number: 973497

## 2021-08-06 NOTE — Patient Instructions (Addendum)
Below is our plan: ? ?We will continue to monitor. Continue focusing on weight management. Keep a close on your BP and cholesterol. Continue discussion of weight and hot flashes with Dr Sabra Heck.  ? ?Please make sure you are staying well hydrated. I recommend 50-60 ounces daily. Well balanced diet and regular exercise encouraged. Consistent sleep schedule with 6-8 hours recommended.  ? ?Please continue follow up with care team as directed.  ? ?Follow up with me as needed  ? ?You may receive a survey regarding today's visit. I encourage you to leave honest feed back as I do use this information to improve patient care. Thank you for seeing me today!  ? ? ?

## 2021-08-06 NOTE — Progress Notes (Signed)
?[facility] ?[address] ?[place]. [place] [address] ?[phone] ? ?     HOSPITAL FOLLOW UP NOTE ? ?[name] ?Date of Birth:  October 15, 1940 ?Medical Record Number:  [id number]  ? ?Reason for Referral:  hospital stroke follow up ? ? ? ?SUBJECTIVE: ? ? ?CHIEF COMPLAINT:  ?Chief Complaint  ?Patient presents with  ? New Patient (Initial Visit)  ?  RM 1, alone. ER f/u post TIA. No concerns.   ? ? ?HPI:  ? ?Michele Thompson is a 81 y.o. who  has a past medical history of Anxiety, Aortic atherosclerosis ([place]), Arthritis, Constipation, Coronary artery disease (03/16/2016), Depression, Diverticulosis, Edema of both lower extremities, Fatty liver, History of MI (myocardial infarction), Hyperlipidemia (03/16/2016), Hypertension, Hypothyroidism, Joint pain, Pacemaker, Pacemaker, Paroxysmal A-fib ([place]), PE (pulmonary thromboembolism) ([place]) (03/17/2016), Psoas tendinitis, Sinus bradycardia (03/31/2016), Thrombocytopenia ([place]) (02/2016), Thyroid disease, Unilateral primary osteoarthritis, right knee, Vascular malformation, and Vitamin D deficiency.  Patient presented on 06/28/2021 with blurry vision and dysarthria. Per ER note, patient noted acute blurry vision that is worse on the right with black floaters. She closed her eyes for about 20 minutes and symptoms resolved. Around 4 PM she was talking to her son over the phone and could comprehend him but had trouble word finding.  BP noted to be elevated (180's/90's). Workup was unremarkable. She was discharged home 06/29/2021.  ? ?Personally reviewed hospitalization pertinent progress notes, lab work and imaging.  Evaluated by Dr Erlinda Hong.  ? ?She reports doing well. No new or worsening symptoms. She resides at [facility]. She reports BP is well managed. She continues rosuvastatin and Eliquis. She is followed closely by cardiology. No residual deficits. Gait is normal. No assistive device. She is not as active as she would like to be due to foot and knee pain.  She endorses some weight gain over the past year. She is concerned about why she is unable to lose weight. She has hot flashes that make her feel nauseous. She is concerned that her morning medication may contribute to feeling nauseated and tired.  ? ? ?PERTINENT IMAGING/LABS ? ?Code Stroke CT head No acute abnormality. ASPECTS 10.    ?CTA head no emergent finding or flow-limiting stenosis, mild atherosclerosis ?MRI  only DWI sequence done and no acute infarct ?Carotid Doppler unremarkable ?2D Echo EF 60-65% ? ? ?A1C ?Lab Results  ?Component Value Date  ? HGBA1C 5.8 (H) 06/29/2021  ? ? ?Lipid Panel  ?   ?Component Value Date/Time  ? CHOL 97 06/29/2021 0243  ? CHOL 113 02/26/2019 0858  ? TRIG 41 06/29/2021 0243  ? HDL 50 06/29/2021 0243  ? HDL 56 02/26/2019 0858  ? CHOLHDL 1.9 06/29/2021 0243  ? VLDL 8 06/29/2021 0243  ? LDLCALC 39 06/29/2021 0243  ? [place] 44 02/26/2019 0858  ? LABVLDL 13 02/26/2019 0858  ? ? ? ? ?ROS:   ?14 system review of systems performed and negative with exception of those listed in HPI ? ?PMH:  ?Past Medical History:  ?Diagnosis Date  ? Anxiety   ? Aortic atherosclerosis ([place])   ? Arthritis   ? Constipation   ? Coronary artery disease 03/16/2016  ? a. admx with angina and mildly elevated Troponin >> LHC: mLAD 30 >> CCB started for poss microvascular angina  ? Depression   ? Diverticulosis   ? Edema of both lower extremities   ? Fatty liver   ? History of MI (myocardial infarction)   ? Hyperlipidemia 03/16/2016  ?  Hypertension   ? Hypothyroidism   ? Joint pain   ? Pacemaker   ? Pacemaker   ? Paroxysmal A-fib ([place])   ? PE (pulmonary thromboembolism) ([place]) 03/17/2016  ? Bilateral submassive pulmonary embolus with RUE and RLE DVT on venous duplex 02/2016 c/b PEA arrest // Echo 03/21/16: mild LVH, EF 60-65, no RWMA, Gr 1 DD, normal RVSF  ? Psoas tendinitis   ? Sinus bradycardia 03/31/2016  ? Thrombocytopenia ([place]) 02/2016  ? Profound thrombocytopenia noted upon admission for pulmonary embolism >>  initially thought to be from Hep induced thrombocytopenia given recent admit for cardiac cath and Heparin use // However, HIT Ab was neg (0.184) // Hematology consult pending  ? Thyroid disease   ? Unilateral primary osteoarthritis, right knee   ? Vascular malformation   ? mid ascending colon  ? Vitamin D deficiency   ? ? ?PSH:  ?Past Surgical History:  ?Procedure Laterality Date  ? CARDIAC CATHETERIZATION N/A 03/15/2016  ? Procedure: Left Heart Cath and Coronary Angiography;  Surgeon: [name], MD;  Location: [facility];  Service: Cardiovascular;  Laterality: N/A;  ? INSERT / REPLACE / REMOVE PACEMAKER    ? PACEMAKER IMPLANT N/A 07/21/2020  ? Procedure: PACEMAKER IMPLANT;  Surgeon: Sanda Klein, MD;  Location: [facility];  Service: Cardiovascular;  Laterality: N/A;  ? TOTAL KNEE ARTHROPLASTY Right 12/27/2016  ? Procedure: RIGHT TOTAL KNEE ARTHROPLASTY;  Surgeon: Gaynelle Arabian, MD;  Location: [facility];  Service: Orthopedics;  Laterality: Right;  ? VAGINAL HYSTERECTOMY  1983  ? endometriosis  ? ? ?Social History:  ?Social History  ? ?Socioeconomic History  ? Marital status: Widowed  ?  Spouse name: Not on file  ? Number of children: Not on file  ? Years of education: Not on file  ? Highest education level: Not on file  ?Occupational History  ? Occupation: retired  ?Tobacco Use  ? Smoking status: Never  ?  Passive exposure: Never  ? Smokeless tobacco: Never  ?Vaping Use  ? Vaping Use: Never used  ?Substance and Sexual Activity  ? Alcohol use: No  ?  Alcohol/week: 0.0 standard drinks  ? Drug use: No  ? Sexual activity: Never  ?  Birth control/protection: Surgical  ?  Comment: 1st intercourse 81 yo-Fewer than 5 partners  ?Other Topics Concern  ? Not on file  ?Social History Narrative  ? [facility]:  ? Originally from [place]. Moved to [place] in 2012. 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  ? Right handed  ? No caffeine use  ? ?Social  Determinants of Health  ? ?Financial Resource Strain: Not on file  ?Food Insecurity: Not on file  ?Transportation Needs: Not on file  ?Physical Activity: Not on file  ?Stress: Not on file  ?Social Connections: Not on file  ?Intimate Partner Violence: Not on file  ? ? ?Family History:  ?Family History  ?Problem Relation Age of Onset  ? Heart failure Mother   ? Heart disease Mother   ? Heart failure Father   ? High blood pressure Father   ? Stroke Father   ? Heart disease Father   ? Heart failure Maternal Grandmother   ? Myasthenia gravis Brother   ? Stroke Brother   ? Clotting disorder Neg Hx   ? Colon cancer Neg Hx   ? ? ?Medications:   ?Current Outpatient Medications on File Prior to Visit  ?Medication Sig Dispense Refill  ?  Blood Pressure Monitoring (OMRON 7 SERIES BP MONITOR) DEVI See admin instructions.    ? Cholecalciferol (VITAMIN D3) 2000 units TABS Take 2,000 Units by mouth daily.    ? diltiazem (CARDIZEM) 30 MG tablet Take 30 mg by mouth 3 (three) times daily as needed.    ? ELIQUIS 5 MG TABS tablet TAKE ONE TABLET BY MOUTH TWICE DAILY (Patient taking differently: Take 5 mg by mouth 2 (two) times daily.) 180 tablet 1  ? gabapentin (NEURONTIN) 300 MG capsule Take 300 mg by mouth at bedtime.    ? hydrochlorothiazide (HYDRODIURIL) 12.5 MG tablet Take 1 tablet (12.5 mg total) by mouth daily. TAKE 1 TABLET EACH DAY. (Patient taking differently: Take 12.5 mg by mouth daily.) 90 tablet 3  ? levothyroxine (SYNTHROID) 100 MCG tablet Take 100 mcg by mouth every morning.    ? lisinopril (ZESTRIL) 40 MG tablet TAKE ONE TABLET BY MOUTH EVERY DAY 90 tablet 3  ? rosuvastatin (CRESTOR) 20 MG tablet Take 1 tablet (20 mg total) by mouth daily. (Patient taking differently: Take 20 mg by mouth every evening.) 90 tablet 3  ? ?No current facility-administered medications on file prior to visit.  ? ? ?Allergies:  No Known Allergies ? ? ? ?OBJECTIVE: ? ?Physical Exam ? ?Vitals:  ? 08/10/21 0806  ?BP: 124/66  ?Pulse: 66  ?Weight:  191 lb (86.6 kg)  ?Height: '5\' 2"'$  (1.575 m)  ? ?Body mass index is 34.93 kg/m?Marland Kitchen ?No results found. ? ? ?  02/26/2019  ?  7:11 AM  ?Depression screen PHQ 2/9  ?Decreased Interest 3  ?Down, Depressed, [place]

## 2021-08-10 ENCOUNTER — Ambulatory Visit: Payer: PPO | Admitting: Family Medicine

## 2021-08-10 ENCOUNTER — Encounter: Payer: Self-pay | Admitting: Family Medicine

## 2021-08-10 VITALS — BP 124/66 | HR 66 | Ht 62.0 in | Wt 191.0 lb

## 2021-08-10 DIAGNOSIS — I674 Hypertensive encephalopathy: Secondary | ICD-10-CM | POA: Diagnosis not present

## 2021-08-10 DIAGNOSIS — E7800 Pure hypercholesterolemia, unspecified: Secondary | ICD-10-CM | POA: Diagnosis not present

## 2021-08-10 DIAGNOSIS — G459 Transient cerebral ischemic attack, unspecified: Secondary | ICD-10-CM | POA: Diagnosis not present

## 2021-08-10 DIAGNOSIS — I10 Essential (primary) hypertension: Secondary | ICD-10-CM

## 2021-08-10 NOTE — Addendum Note (Signed)
Addended byUbaldo Glassing,  L on: 08/10/2021 11:42 AM ? ? Modules accepted: Level of Service ? ?

## 2021-08-20 ENCOUNTER — Other Ambulatory Visit: Payer: Self-pay | Admitting: Cardiovascular Disease

## 2021-08-21 NOTE — Progress Notes (Signed)
Remote pacemaker transmission.   

## 2021-08-24 DIAGNOSIS — J019 Acute sinusitis, unspecified: Secondary | ICD-10-CM | POA: Diagnosis not present

## 2021-09-10 ENCOUNTER — Other Ambulatory Visit: Payer: Self-pay | Admitting: *Deleted

## 2021-09-10 DIAGNOSIS — I480 Paroxysmal atrial fibrillation: Secondary | ICD-10-CM

## 2021-09-10 MED ORDER — APIXABAN 5 MG PO TABS: 5.0000 mg | ORAL_TABLET | Freq: Two times a day (BID) | ORAL | 2 refills | Status: DC

## 2021-09-10 NOTE — Telephone Encounter (Signed)
Eliquis '5mg'$  refill request received. Patient is 81 years old, weight-86.6kg, Crea-0.86 on 07/20/2021, Diagnosis-Afib, and last seen by Almyra Deforest on 07/22/2021. Dose is appropriate based on dosing criteria. Will send in refill to requested pharmacy.    90 day supply with refill recently sent to local pharmacy but pt is enrolled in their new program to send meds to [facility] and the will ship to [facility] then [facility] will deliver it to their house.

## 2021-10-16 IMAGING — MG MM DIGITAL SCREENING BILAT W/ TOMO AND CAD
8 of 14 series · 8 of 40 positions shown · non-contrast
Comparison: Previous exam(s).

CLINICAL DATA: Screening.

EXAM:
DIGITAL SCREENING BILATERAL MAMMOGRAM WITH TOMOSYNTHESIS AND CAD
TECHNIQUE: Bilateral screening digital craniocaudal and mediolateral oblique
mammograms were obtained. Bilateral screening digital breast
tomosynthesis was performed. The images were evaluated with
computer-aided detection.

[L MLO synth-2D (1 of 3)]
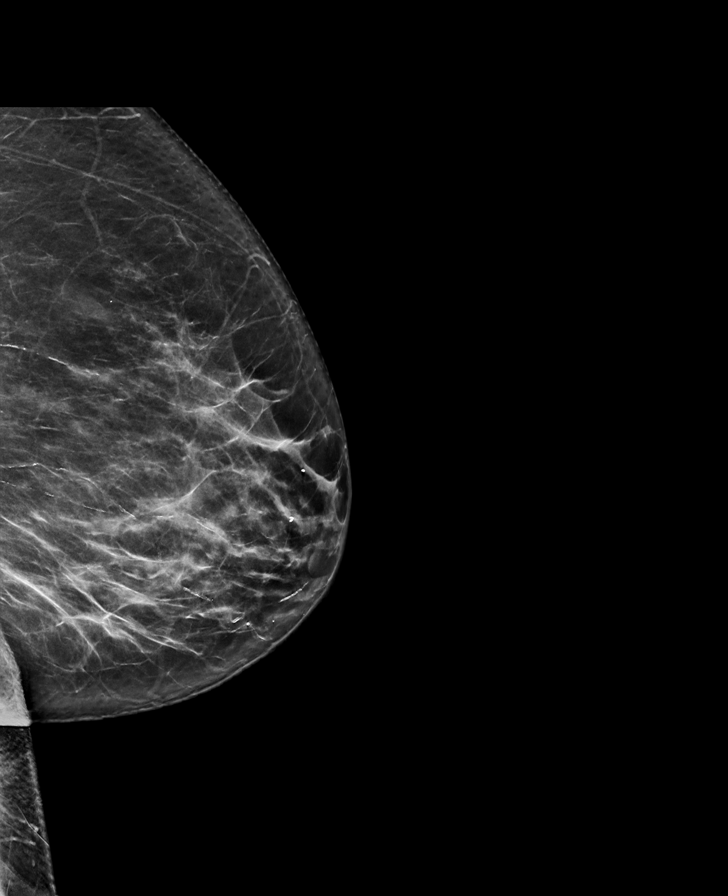

[L MLO synth-2D (2 of 3)]
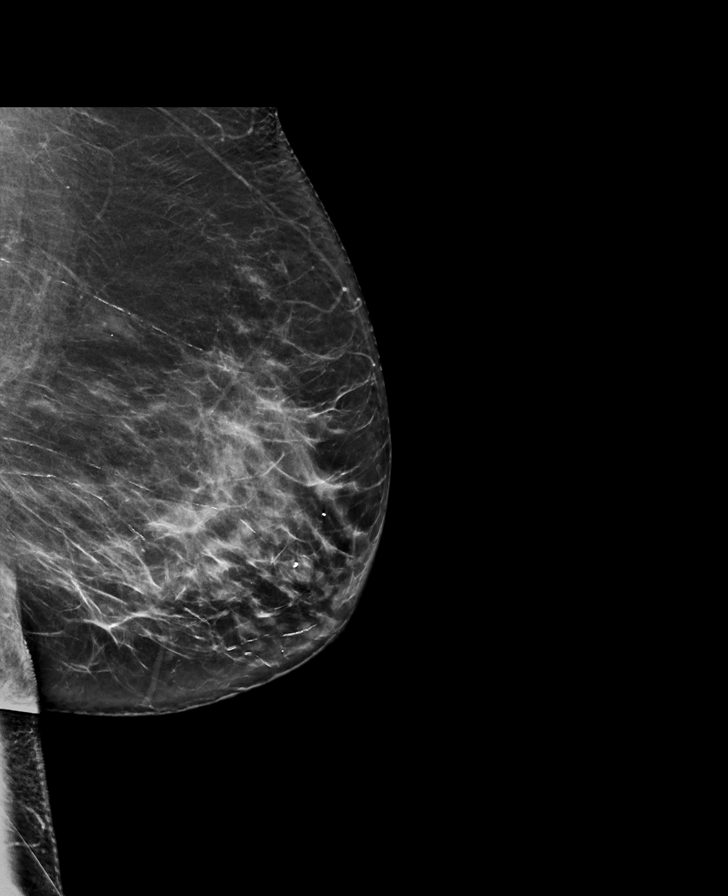

[R CC synth-2D]
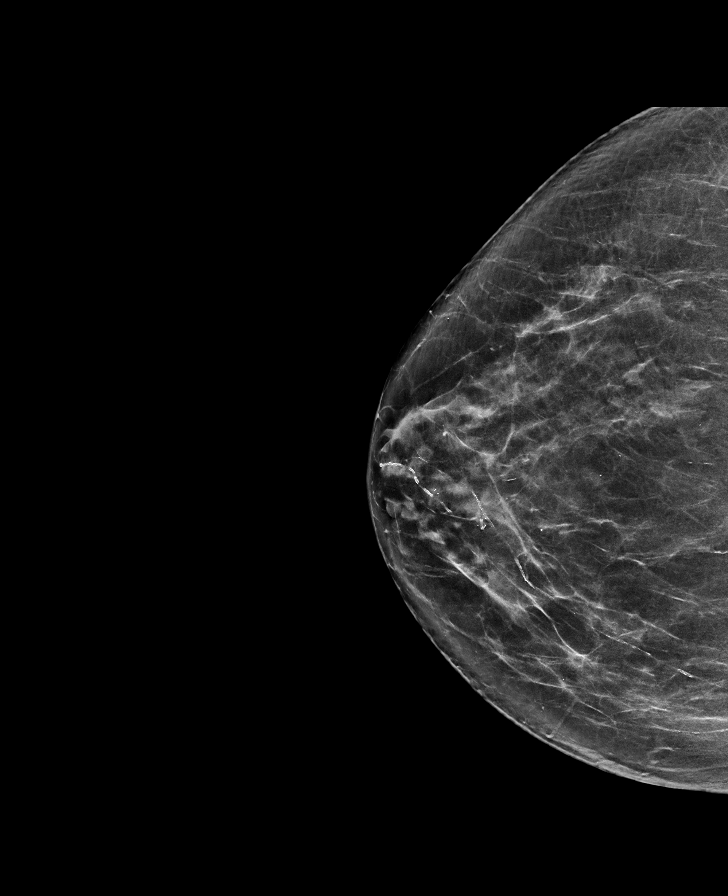

[L CC synth-2D]
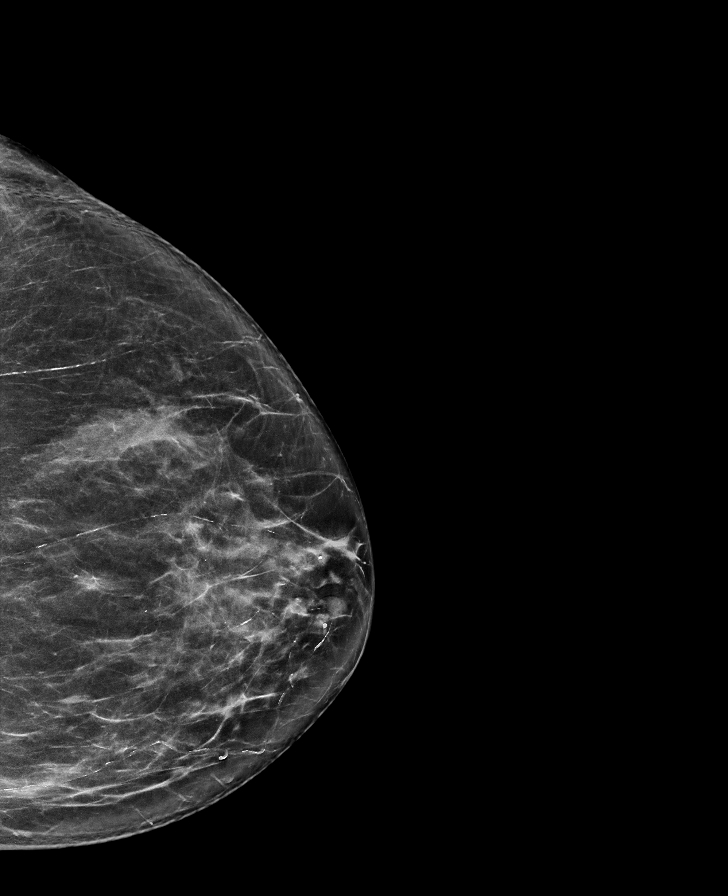

[L MLO synth-2D (3 of 3)]
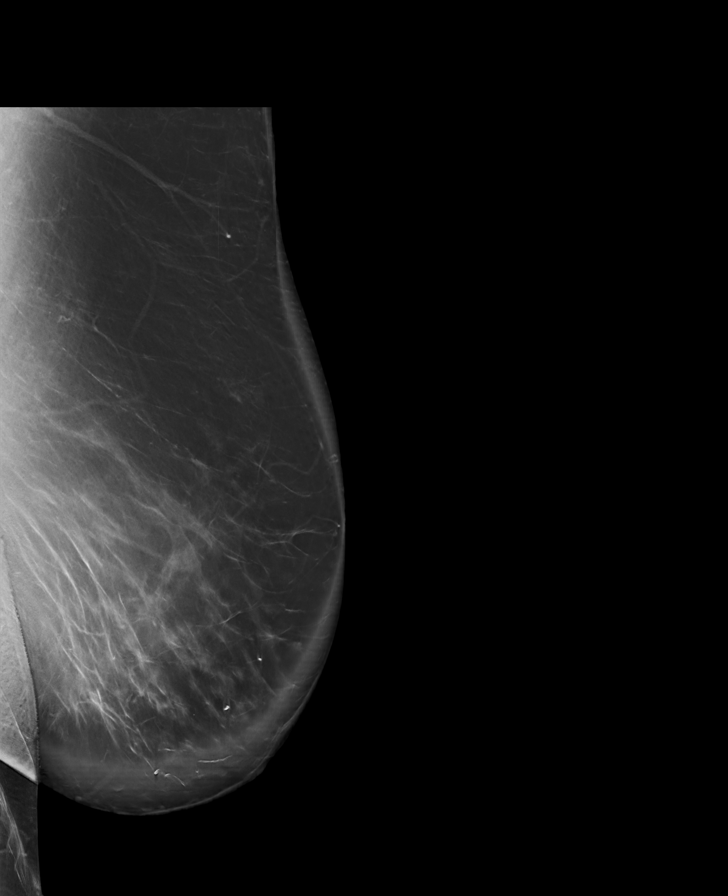

[R MLO synth-2D (1 of 2)]
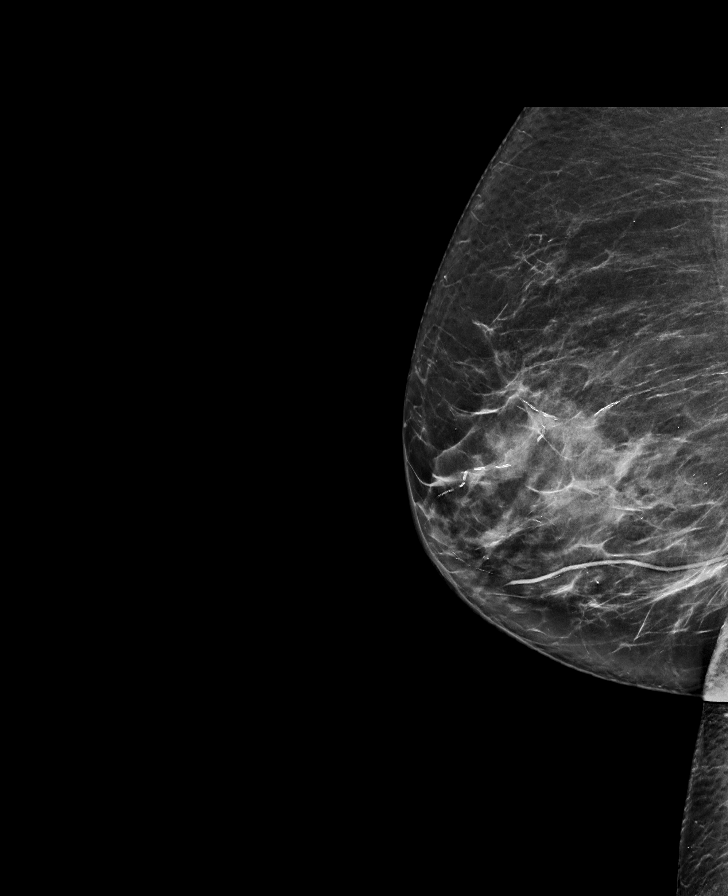

[R MLO synth-2D (2 of 2)]
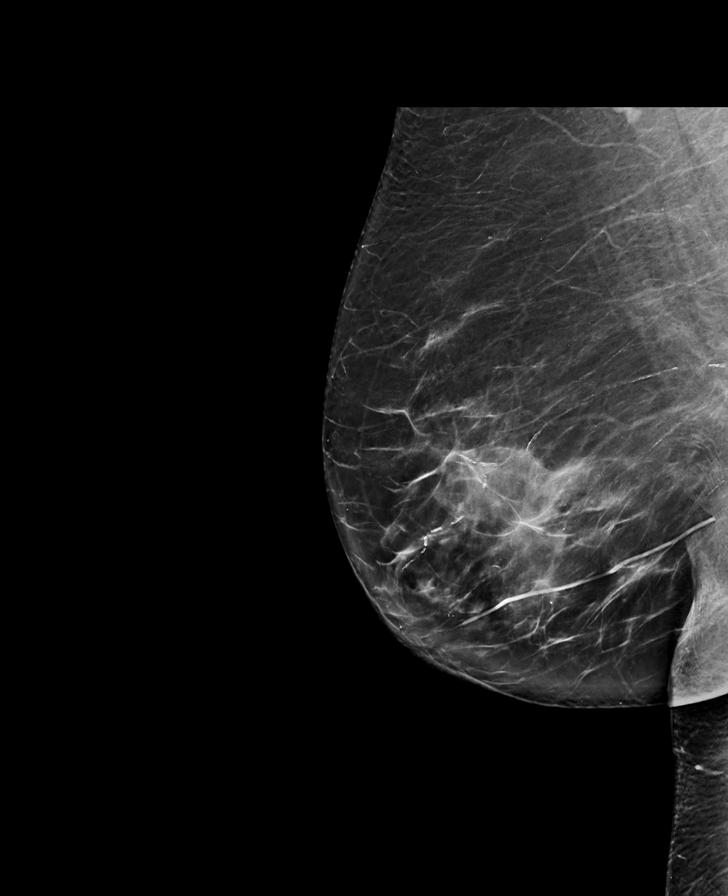

[R CC tomo · tomo slice 39/76.0]
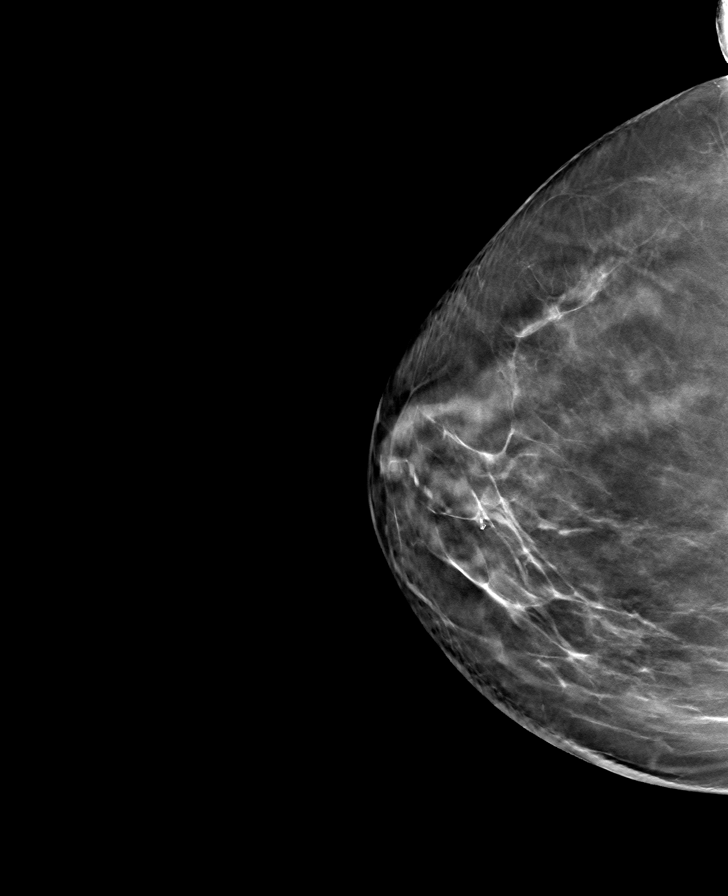

[8 of 40 positions shown; findings below may reference images not displayed]

ACR Breast Density Category c: The breast tissue is heterogeneously
dense, which may obscure small masses.
FINDINGS: There are no findings suspicious for malignancy.
IMPRESSION: No mammographic evidence of malignancy. A result letter of this
screening mammogram will be mailed directly to the patient.

RECOMMENDATION:
Screening mammogram in one year. (Code:Q3-W-BC3)

BI-RADS CATEGORY  1: Negative.

## 2021-10-29 ENCOUNTER — Ambulatory Visit (INDEPENDENT_AMBULATORY_CARE_PROVIDER_SITE_OTHER): Payer: PPO | Admitting: Cardiovascular Disease

## 2021-10-29 ENCOUNTER — Encounter: Payer: Self-pay | Admitting: Cardiovascular Disease

## 2021-10-29 VITALS — BP 136/60 | HR 76 | Ht 62.75 in | Wt 192.0 lb

## 2021-10-29 DIAGNOSIS — I25111 Atherosclerotic heart disease of native coronary artery with angina pectoris with documented spasm: Secondary | ICD-10-CM | POA: Diagnosis not present

## 2021-10-29 DIAGNOSIS — Z950 Presence of cardiac pacemaker: Secondary | ICD-10-CM | POA: Diagnosis not present

## 2021-10-29 DIAGNOSIS — I10 Essential (primary) hypertension: Secondary | ICD-10-CM | POA: Diagnosis not present

## 2021-10-29 DIAGNOSIS — Z86711 Personal history of pulmonary embolism: Secondary | ICD-10-CM

## 2021-10-29 DIAGNOSIS — E7800 Pure hypercholesterolemia, unspecified: Secondary | ICD-10-CM | POA: Diagnosis not present

## 2021-10-29 DIAGNOSIS — D6869 Other thrombophilia: Secondary | ICD-10-CM

## 2021-10-29 DIAGNOSIS — I480 Paroxysmal atrial fibrillation: Secondary | ICD-10-CM | POA: Diagnosis not present

## 2021-10-29 DIAGNOSIS — I495 Sick sinus syndrome: Secondary | ICD-10-CM

## 2021-10-29 MED ORDER — HYDROCHLOROTHIAZIDE 12.5 MG PO TABS: 12.5000 mg | ORAL_TABLET | Freq: Every day | ORAL | 3 refills | Status: DC

## 2021-10-29 NOTE — Progress Notes (Signed)
.  Cardiology Office Note:    Date:  10/30/2021   ID:  Michele Thompson, DOB 07-Apr-1941, MRN [id number]  PCP:  Kathyrn Lass, MD  Cardiologist:  Sanda Klein, MD   Referring MD: Kathyrn Lass, MD   Chief Complaint  Patient presents with   Atrial Fibrillation      History of Present Illness:    Michele Thompson is a 81 y.o. female with a hx of massive pulmonary embolism, complicated by cardiorespiratory arrest, while on estrogen therapy in 2017.  She had an episode of paroxysmal atrial fibrillation and underwent cardioversion in August 2019.  She also has minor coronary artery disease and suspicion for coronary vasospasm on previous angiography, hypertension and hyperlipidemia.  Previous Holter monitor showed evidence of sinus node dysfunction and chronotropic incompetence while taking beta-blockers.   She has normal left ventricular systolic function on echo and had normal pulmonary function test in 2018.  She underwent implantation of a dual-chamber permanent pacemaker in April 2022 ([place]).  After the last adjustment in her rate response sensor she feels better.  She has started playing golf again.  She does get tired when she is about a 6 hole and has to sit out and rest for a while.  She denies shortness of breath and chest pain, but does have fatigue.  She has not had any noticeable episodes of atrial fibrillation since the 5-hour episode that led to the emergency room visit on April 3.  She did have 1 more 9-minute event that occurred on May 14, asymptomatic despite mild RVR.  Pacemaker interrogation otherwise shows normal findings with an estimated generator longevity of 12 years, good lead parameters, 62% atrial pacing, less than 1% ventricular pacing and well under 1% burden of atrial fibrillation.  Heart rate histogram distribution is quite favorable.  She has not needed to use the prescription for as needed immediate release diltiazem.  Her most recent labs continue to show  good metabolic parameters with an LDL of 39, HDL of 50 and hemoglobin A1c of 5.8%, creatinine 0.86.  Past Medical History:  Diagnosis Date   Anxiety    Aortic atherosclerosis ([place])    Arthritis    Constipation    Coronary artery disease 03/16/2016   a. admx with angina and mildly elevated Troponin >> LHC: mLAD 30 >> CCB started for poss microvascular angina   Depression    Diverticulosis    Edema of both lower extremities    Fatty liver    History of MI (myocardial infarction)    Hyperlipidemia 03/16/2016   Hypertension    Hypothyroidism    Joint pain    Pacemaker    Pacemaker    Paroxysmal A-fib ([place])    PE (pulmonary thromboembolism) ([place]) 03/17/2016   Bilateral submassive pulmonary embolus with RUE and RLE DVT on venous duplex 02/2016 c/b PEA arrest // Echo 03/21/16: mild LVH, EF 60-65, no RWMA, Gr 1 DD, normal RVSF   Psoas tendinitis    Sinus bradycardia 03/31/2016   Thrombocytopenia ([place]) 02/2016   Profound thrombocytopenia noted upon admission for pulmonary embolism >> initially thought to be from Hep induced thrombocytopenia given recent admit for cardiac cath and Heparin use // However, HIT Ab was neg (0.184) // Hematology consult pending   Thyroid disease    Unilateral primary osteoarthritis, right knee    Vascular malformation    mid ascending colon   Vitamin D deficiency     Past Surgical History:  Procedure Laterality Date  CARDIAC CATHETERIZATION N/A 03/15/2016   Procedure: Left Heart Cath and Coronary Angiography;  Surgeon: [name], MD;  Location: [facility];  Service: Cardiovascular;  Laterality: N/A;   INSERT / REPLACE / REMOVE PACEMAKER     PACEMAKER IMPLANT N/A 07/21/2020   Procedure: PACEMAKER IMPLANT;  Surgeon: Sanda Klein, MD;  Location: [facility];  Service: Cardiovascular;  Laterality: N/A;   TOTAL KNEE ARTHROPLASTY Right 12/27/2016   Procedure: RIGHT TOTAL KNEE ARTHROPLASTY;  Surgeon: Gaynelle Arabian, MD;  Location: [facility];   Service: Orthopedics;  Laterality: Right;   VAGINAL HYSTERECTOMY  1983   endometriosis    Current Medications: Current Meds  Medication Sig   apixaban (ELIQUIS) 5 MG TABS tablet Take 1 tablet (5 mg total) by mouth 2 (two) times daily.   Blood Pressure Monitoring (OMRON 7 SERIES BP MONITOR) DEVI See admin instructions.   Cholecalciferol (VITAMIN D3) 2000 units TABS Take 2,000 Units by mouth daily.   gabapentin (NEURONTIN) 300 MG capsule Take 300 mg by mouth at bedtime.   levothyroxine (SYNTHROID) 100 MCG tablet Take 100 mcg by mouth every morning.   lisinopril (ZESTRIL) 40 MG tablet TAKE ONE TABLET BY MOUTH EVERY DAY   rosuvastatin (CRESTOR) 20 MG tablet Take 1 tablet (20 mg total) by mouth daily. (Patient taking differently: Take 20 mg by mouth every evening.)   [DISCONTINUED] hydrochlorothiazide (HYDRODIURIL) 12.5 MG tablet TAKE ONE TABLET BY MOUTH EVERY DAY     Allergies:   Patient has no known allergies.   Social History   Socioeconomic History   Marital status: Widowed    Spouse name: Not on file   Number of children: Not on file   Years of education: Not on file   Highest education level: Not on file  Occupational History   Occupation: retired  Tobacco Use   Smoking status: Never    Passive exposure: Never   Smokeless tobacco: Never  Vaping Use   Vaping Use: Never used  Substance and Sexual Activity   Alcohol use: No    Alcohol/week: 0.0 standard drinks of alcohol   Drug use: No   Sexual activity: Never    Birth control/protection: Surgical    Comment: 1st intercourse 81 yo-Fewer than 5 partners  Other Topics Concern   Not on file  Social History Narrative   [facility]:   Originally from [place]. Moved to [place] in 2012. 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   Right handed   No caffeine use   Social Determinants of Health   Financial Resource Strain: Not on file  Food Insecurity: Not on file   Transportation Needs: Not on file  Physical Activity: Not on file  Stress: Not on file  Social Connections: Not on file     Family History: The patient's family history includes Heart disease in her father and mother; Heart failure in her father, maternal grandmother, and mother; High blood pressure in her father; Myasthenia gravis in her brother; Stroke in her brother and father. There is no history of Clotting disorder or Colon cancer.  ROS:   Please see the history of present illness.    All other systems are reviewed and are negative.  EKGs/Labs/Other Studies Reviewed:    The following studies were reviewed today: Comprehensive check of the pacemaker in the office today.  EKG: Intracardiac electrogram shows atrial paced, ventricular sensed rhythm. Recent Labs: 06/28/2021: ALT 7 07/20/2021: BUN 19; Creatinine, Ser 0.86; Hemoglobin  12.3; Magnesium 2.1; Platelets 152; Potassium 3.9; Sodium 139  Recent Lipid Panel    Component Value Date/Time   CHOL 97 06/29/2021 0243   CHOL 113 02/26/2019 0858   TRIG 41 06/29/2021 0243   HDL 50 06/29/2021 0243   HDL 56 02/26/2019 0858   CHOLHDL 1.9 06/29/2021 0243   VLDL 8 06/29/2021 0243   LDLCALC 39 06/29/2021 0243   LDLCALC 44 02/26/2019 0858   01/21/2021 Cholesterol 127, HDL 54, LDL 60, triglycerides 64, potassium 4.5, ALT 11, TSH 10.36  06/29/2021 Cholesterol 97, HDL 50, LDL 39, triglycerides 41 Hemoglobin A1c 5.8%  07/20/2021 Hemoglobin 12.3, creatinine 0.6, potassium 3.9  07/28/2021 TSH 0.16  Physical Exam:    VS:  BP 136/60 (BP Location: Left Arm, Patient Position: Sitting, Cuff Size: Large)   Pulse 76   Ht 5' 2.75" (1.594 m)   Wt 192 lb (87.1 kg)   SpO2 96%   BMI 34.28 kg/m     Wt Readings from Last 3 Encounters:  10/29/21 192 lb (87.1 kg)  08/10/21 191 lb (86.6 kg)  07/22/21 191 lb (86.6 kg)     General: Alert, oriented x3, no distress, well-healed left subclavian pacemaker site.  Obese. Head: no evidence of  trauma, PERRL, EOMI, no exophtalmos or lid lag, no myxedema, no xanthelasma; normal ears, nose and oropharynx Neck: normal jugular venous pulsations and no hepatojugular reflux; brisk carotid pulses without delay and no carotid bruits Chest: clear to auscultation, no signs of consolidation by percussion or palpation, normal fremitus, symmetrical and full respiratory excursions Cardiovascular: normal position and quality of the apical impulse, regular rhythm, normal first and second heart sounds, no murmurs, rubs or gallops Abdomen: no tenderness or distention, no masses by palpation, no abnormal pulsatility or arterial bruits, normal bowel sounds, no hepatosplenomegaly Extremities: no clubbing, cyanosis or edema; 2+ radial, ulnar and brachial pulses bilaterally; 2+ right femoral, posterior tibial and dorsalis pedis pulses; 2+ left femoral, posterior tibial and dorsalis pedis pulses; no subclavian or femoral bruits Neurological: grossly nonfocal Psych: Normal mood and affect  ASSESSMENT:    1. PAF (paroxysmal atrial fibrillation) ([place])   2. SSS (sick sinus syndrome) ([place])   3. Pacemaker   4. Coronary artery disease involving native coronary artery of native heart with angina pectoris with documented spasm ([place])   5. History of pulmonary embolism   6. Acquired thrombophilia ([place])   7. Essential hypertension   8. Hypercholesterolemia      PLAN:    In order of problems listed above:  PAFib: Prevalence remains low at less than 1%.  Last symptomatic event was April 3 when she was evaluated briefly in the emergency room.  That was a long episode lasted for about 5 hours.  She did not notice a shorter episode lasting about 10 minutes in May.  Okay to take diltiazem immediate release 30 mg once or twice during an episode of atrial fib before she decides she needs urgent evaluation in a medical facility, as long as she does not have severe chest pain, severe dyspnea or syncope (under which  circumstances she should immediately go to the emergency room)..  CHA2DS2-Vasc score 5 (age, CAD, HTN, female).  I still think it is premature to start true antiarrhythmic therapy since the burden of arrhythmia is so low.   SSS: Improved symptomatically after adjusting her rate response settings. Pacemaker: Roughly 60% atrial pacing, good heart rate histogram distribution. CAD: Denies angina pectoris.  There has been a suspicion of coronary vasospasm but  she is currently angina free.  Lipid profile is excellent. Hx of PE: Plan lifelong anticoagulation for her atrial fibrillation anyway.  May have some degree of residual exertional dyspnea from her massive pulmonary embolism. Anticoagulation: Has not had any bleeding complications or falls.  Continue Eliquis.Marland Kitchen HTN: Adequate control. HLP: All lipid parameters are good.   Patient Instructions  Medication Instructions:  No Changes In Medications at this time.  *If you need a refill on your cardiac medications before your next appointment, please call your pharmacy*  Follow-Up: At [facility], you and your health needs are our priority.  As part of our continuing mission to provide you with exceptional heart care, we have created designated Provider Care Teams.  These Care Teams include your primary Cardiologist (physician) and Advanced Practice Providers (APPs -  Physician Assistants and Nurse Practitioners) who all work together to provide you with the care you need, when you need it.  Your next appointment:   1 year(s)  The format for your next appointment:   In Person  Provider:   Sanda Klein, MD          Medication Adjustments/Labs and Tests Ordered: Current medicines are reviewed at length with the patient today.  Concerns regarding medicines are outlined above.  No orders of the defined types were placed in this encounter.   Meds ordered this encounter  Medications   hydrochlorothiazide (HYDRODIURIL) 12.5 MG tablet     Sig: Take 1 tablet (12.5 mg total) by mouth daily.    Dispense:  90 tablet    Refill:  3     Signed, Sanda Klein, MD  10/30/2021 8:32 PM    [place]

## 2021-10-29 NOTE — Patient Instructions (Signed)
Medication Instructions:  No Changes In Medications at this time.  *If you need a refill on your cardiac medications before your next appointment, please call your pharmacy*  Follow-Up: At [facility], you and your health needs are our priority.  As part of our continuing mission to provide you with exceptional heart care, we have created designated Provider Care Teams.  These Care Teams include your primary Cardiologist (physician) and Advanced Practice Providers (APPs -  Physician Assistants and Nurse Practitioners) who all work together to provide you with the care you need, when you need it.  Your next appointment:   1 year(s)  The format for your next appointment:   In Person  Provider:   Sanda Klein, MD

## 2021-10-30 ENCOUNTER — Other Ambulatory Visit: Payer: Self-pay | Admitting: Family Medicine

## 2021-10-30 DIAGNOSIS — Z950 Presence of cardiac pacemaker: Secondary | ICD-10-CM | POA: Insufficient documentation

## 2021-10-30 DIAGNOSIS — Z1231 Encounter for screening mammogram for malignant neoplasm of breast: Secondary | ICD-10-CM

## 2021-11-03 ENCOUNTER — Ambulatory Visit (INDEPENDENT_AMBULATORY_CARE_PROVIDER_SITE_OTHER): Payer: PPO

## 2021-11-03 DIAGNOSIS — I495 Sick sinus syndrome: Secondary | ICD-10-CM

## 2021-11-05 LAB — CUP PACEART REMOTE DEVICE CHECK
Battery Remaining Longevity: 126 mo
Battery Remaining Percentage: 100 %
Brady Statistic RA Percent Paced: 62 %
Brady Statistic RV Percent Paced: 0 %
Date Time Interrogation Session: 20230719112600
Implantable Lead Implant Date: 20220406
Implantable Lead Implant Date: 20220406
Implantable Lead Location: 753859
Implantable Lead Location: 753860
Implantable Lead Model: 7840
Implantable Lead Model: 7841
Implantable Lead Serial Number: 1037262
Implantable Lead Serial Number: 1127954
Implantable Pulse Generator Implant Date: 20220406
Lead Channel Impedance Value: 628 Ohm
Lead Channel Impedance Value: 764 Ohm
Lead Channel Pacing Threshold Amplitude: 0.7 V
Lead Channel Pacing Threshold Amplitude: 0.7 V
Lead Channel Pacing Threshold Pulse Width: 0.4 ms
Lead Channel Pacing Threshold Pulse Width: 0.4 ms
Lead Channel Setting Pacing Amplitude: 3.5 V
Lead Channel Setting Pacing Amplitude: 3.5 V
Lead Channel Setting Pacing Pulse Width: 0.4 ms
Lead Channel Setting Sensing Sensitivity: 2.5 mV
Pulse Gen Serial Number: 973497

## 2021-11-11 ENCOUNTER — Telehealth: Payer: Self-pay

## 2021-11-11 NOTE — Telephone Encounter (Signed)
The patient states she had an A-fib episode last night. I had the patient send a manual transmission. I told her the nurse will call her when she come back.

## 2021-11-11 NOTE — Telephone Encounter (Signed)
Called patient back and let her know that the total time of AF from last night was about 8 hrs, patient stated that she took PO Cardizem PRN and patient stated she started to feel better around 1030 which correlates with last high V rate during AT/AF. Patient appreciative of call back

## 2021-11-23 DIAGNOSIS — H02831 Dermatochalasis of right upper eyelid: Secondary | ICD-10-CM | POA: Diagnosis not present

## 2021-11-23 DIAGNOSIS — H04123 Dry eye syndrome of bilateral lacrimal glands: Secondary | ICD-10-CM | POA: Diagnosis not present

## 2021-11-23 DIAGNOSIS — H02834 Dermatochalasis of left upper eyelid: Secondary | ICD-10-CM | POA: Diagnosis not present

## 2021-11-25 ENCOUNTER — Encounter (INDEPENDENT_AMBULATORY_CARE_PROVIDER_SITE_OTHER): Payer: Self-pay

## 2021-12-01 NOTE — Progress Notes (Signed)
Remote pacemaker transmission.   

## 2021-12-04 ENCOUNTER — Ambulatory Visit
Admission: RE | Admit: 2021-12-04 | Discharge: 2021-12-04 | Disposition: A | Discharge status: Home / Self Care | Payer: PPO | Source: Ambulatory Visit | Attending: Family Medicine | Admitting: Family Medicine

## 2021-12-04 DIAGNOSIS — Z1231 Encounter for screening mammogram for malignant neoplasm of breast: Secondary | ICD-10-CM

## 2021-12-07 ENCOUNTER — Encounter: Payer: Self-pay | Admitting: Gastroenterology

## 2021-12-12 ENCOUNTER — Encounter: Payer: Self-pay | Admitting: Cardiovascular Disease

## 2021-12-16 DIAGNOSIS — I10 Essential (primary) hypertension: Secondary | ICD-10-CM | POA: Diagnosis not present

## 2021-12-16 DIAGNOSIS — E7800 Pure hypercholesterolemia, unspecified: Secondary | ICD-10-CM | POA: Diagnosis not present

## 2021-12-16 DIAGNOSIS — E890 Postprocedural hypothyroidism: Secondary | ICD-10-CM | POA: Diagnosis not present

## 2021-12-16 DIAGNOSIS — I480 Paroxysmal atrial fibrillation: Secondary | ICD-10-CM | POA: Diagnosis not present

## 2021-12-28 DIAGNOSIS — H903 Sensorineural hearing loss, bilateral: Secondary | ICD-10-CM | POA: Diagnosis not present

## 2022-01-08 ENCOUNTER — Encounter: Payer: Self-pay | Admitting: Cardiovascular Disease

## 2022-01-19 DIAGNOSIS — I10 Essential (primary) hypertension: Secondary | ICD-10-CM | POA: Diagnosis not present

## 2022-01-19 DIAGNOSIS — Z7901 Long term (current) use of anticoagulants: Secondary | ICD-10-CM | POA: Diagnosis not present

## 2022-01-19 DIAGNOSIS — K760 Fatty (change of) liver, not elsewhere classified: Secondary | ICD-10-CM | POA: Diagnosis not present

## 2022-01-19 DIAGNOSIS — I674 Hypertensive encephalopathy: Secondary | ICD-10-CM | POA: Diagnosis not present

## 2022-01-19 DIAGNOSIS — I2510 Atherosclerotic heart disease of native coronary artery without angina pectoris: Secondary | ICD-10-CM | POA: Diagnosis not present

## 2022-01-19 DIAGNOSIS — Z86711 Personal history of pulmonary embolism: Secondary | ICD-10-CM | POA: Diagnosis not present

## 2022-01-19 DIAGNOSIS — E890 Postprocedural hypothyroidism: Secondary | ICD-10-CM | POA: Diagnosis not present

## 2022-01-19 DIAGNOSIS — E785 Hyperlipidemia, unspecified: Secondary | ICD-10-CM | POA: Diagnosis not present

## 2022-01-19 DIAGNOSIS — Z8673 Personal history of transient ischemic attack (TIA), and cerebral infarction without residual deficits: Secondary | ICD-10-CM | POA: Diagnosis not present

## 2022-01-19 DIAGNOSIS — Z23 Encounter for immunization: Secondary | ICD-10-CM | POA: Diagnosis not present

## 2022-01-19 DIAGNOSIS — D126 Benign neoplasm of colon, unspecified: Secondary | ICD-10-CM | POA: Diagnosis not present

## 2022-01-19 DIAGNOSIS — I700 Atherosclerosis of aorta: Secondary | ICD-10-CM | POA: Diagnosis not present

## 2022-01-21 DIAGNOSIS — M25562 Pain in left knee: Secondary | ICD-10-CM | POA: Diagnosis not present

## 2022-01-21 DIAGNOSIS — Z96651 Presence of right artificial knee joint: Secondary | ICD-10-CM | POA: Diagnosis not present

## 2022-01-21 DIAGNOSIS — M1712 Unilateral primary osteoarthritis, left knee: Secondary | ICD-10-CM | POA: Diagnosis not present

## 2022-02-02 ENCOUNTER — Ambulatory Visit (INDEPENDENT_AMBULATORY_CARE_PROVIDER_SITE_OTHER): Payer: PPO

## 2022-02-02 DIAGNOSIS — I495 Sick sinus syndrome: Secondary | ICD-10-CM

## 2022-02-02 LAB — CUP PACEART REMOTE DEVICE CHECK
Battery Remaining Longevity: 138 mo
Battery Remaining Percentage: 100 %
Brady Statistic RA Percent Paced: 62 %
Brady Statistic RV Percent Paced: 0 %
Date Time Interrogation Session: 20231017020200
Implantable Lead Implant Date: 20220406
Implantable Lead Implant Date: 20220406
Implantable Lead Location: 753859
Implantable Lead Location: 753860
Implantable Lead Model: 7840
Implantable Lead Model: 7841
Implantable Lead Serial Number: 1037262
Implantable Lead Serial Number: 1127954
Implantable Pulse Generator Implant Date: 20220406
Lead Channel Impedance Value: 658 Ohm
Lead Channel Impedance Value: 840 Ohm
Lead Channel Pacing Threshold Amplitude: 0.5 V
Lead Channel Pacing Threshold Amplitude: 0.6 V
Lead Channel Pacing Threshold Pulse Width: 0.4 ms
Lead Channel Pacing Threshold Pulse Width: 0.4 ms
Lead Channel Setting Pacing Amplitude: 3.5 V
Lead Channel Setting Pacing Amplitude: 3.5 V
Lead Channel Setting Pacing Pulse Width: 0.4 ms
Lead Channel Setting Sensing Sensitivity: 2.5 mV
Pulse Gen Serial Number: 973497

## 2022-02-03 DIAGNOSIS — M25561 Pain in right knee: Secondary | ICD-10-CM | POA: Diagnosis not present

## 2022-02-03 DIAGNOSIS — M25562 Pain in left knee: Secondary | ICD-10-CM | POA: Diagnosis not present

## 2022-02-04 ENCOUNTER — Ambulatory Visit: Payer: PPO | Admitting: Gastroenterology

## 2022-02-04 ENCOUNTER — Encounter: Payer: Self-pay | Admitting: Gastroenterology

## 2022-02-04 VITALS — BP 130/80 | HR 65 | Ht 63.0 in | Wt 188.5 lb

## 2022-02-04 DIAGNOSIS — Z7901 Long term (current) use of anticoagulants: Secondary | ICD-10-CM

## 2022-02-04 DIAGNOSIS — K602 Anal fissure, unspecified: Secondary | ICD-10-CM | POA: Diagnosis not present

## 2022-02-04 DIAGNOSIS — K625 Hemorrhage of anus and rectum: Secondary | ICD-10-CM

## 2022-02-04 MED ORDER — BENEFIBER PO POWD: ORAL | 0 refills | Status: DC

## 2022-02-04 MED ORDER — AMBULATORY NON FORMULARY MEDICATION
0 refills | Status: DC
Start: 2022-02-04 — End: 2023-01-22

## 2022-02-04 NOTE — Progress Notes (Signed)
Michele Thompson    [id number]    14-Dec-1940  Primary Care Physician:Mcmanamon, Lattie Haw, MD  Referring Physician: Kathyrn Lass, MD [place],  [place] [address]   Chief complaint:  Hemorrhoids  HPI: 81 yr old very pleasant female here with complaints of intermittent bright red blood per rectum on and off for the past 4 years.  Patient believes it is coming from her hemorrhoids.  Denies excessive straining Her last colonoscopy was in 2019 with removal of sessile polyps, diverticulosis and hemorrhoids  Other relevant medical history includes hypertension, NSTEMI, CAD proximal A-fib, TIA on chronic anticoagulation with Eliquis  Colonoscopy 09/2017 - The perianal and digital rectal examinations were normal. - Two sessile polyps were found in the descending colon. The polyps were 4 to 6 mm in size. These polyps were removed with a cold snare. Resection and retrieval were complete. - Multiple small and large-mouthed diverticula were found in the sigmoid colon, descending colon, transverse colon and ascending colon. - Non-bleeding internal hemorrhoids were found during retroflexion. The hemorrhoids were small.    Outpatient Encounter Medications as of 02/04/2022  Medication Sig   apixaban (ELIQUIS) 5 MG TABS tablet Take 1 tablet (5 mg total) by mouth 2 (two) times daily.   Blood Pressure Monitoring (OMRON 7 SERIES BP MONITOR) DEVI See admin instructions.   Cholecalciferol (VITAMIN D3) 2000 units TABS Take 2,000 Units by mouth daily.   diltiazem (CARDIZEM) 30 MG tablet Take 30 mg by mouth 3 (three) times daily as needed. (Patient not taking: Reported on 10/29/2021)   hydrochlorothiazide (HYDRODIURIL) 12.5 MG tablet Take 1 tablet (12.5 mg total) by mouth daily.   levothyroxine (SYNTHROID) 100 MCG tablet Take 100 mcg by mouth every morning.   lisinopril (ZESTRIL) 40 MG tablet TAKE ONE TABLET BY MOUTH EVERY DAY   rosuvastatin (CRESTOR) 20 MG tablet Take 1 tablet (20 mg  total) by mouth daily. (Patient taking differently: Take 20 mg by mouth every evening.)   [DISCONTINUED] gabapentin (NEURONTIN) 300 MG capsule Take 300 mg by mouth at bedtime.   No facility-administered encounter medications on file as of 02/04/2022.    Allergies as of 02/04/2022   (No Known Allergies)    Past Medical History:  Diagnosis Date   Anxiety    Aortic atherosclerosis (HCC)    Arthritis    Constipation    Coronary artery disease 03/16/2016   a. admx with angina and mildly elevated Troponin >> LHC: mLAD 30 >> CCB started for poss microvascular angina   Depression    Diverticulosis    Edema of both lower extremities    Fatty liver    History of MI (myocardial infarction)    Hyperlipidemia 03/16/2016   Hypertension    Hypothyroidism    Joint pain    Pacemaker    Pacemaker    Paroxysmal A-fib ([place])    PE (pulmonary thromboembolism) ([place]) 03/17/2016   Bilateral submassive pulmonary embolus with RUE and RLE DVT on venous duplex 02/2016 c/b PEA arrest // Echo 03/21/16: mild LVH, EF 60-65, no RWMA, Gr 1 DD, normal RVSF   Psoas tendinitis    Sinus bradycardia 03/31/2016   Thrombocytopenia ([place]) 02/2016   Profound thrombocytopenia noted upon admission for pulmonary embolism >> initially thought to be from Hep induced thrombocytopenia given recent admit for cardiac cath and Heparin use // However, HIT Ab was neg (0.184) // Hematology consult pending   Thyroid disease    Unilateral primary  osteoarthritis, right knee    Vascular malformation    mid ascending colon   Vitamin D deficiency     Past Surgical History:  Procedure Laterality Date   CARDIAC CATHETERIZATION N/A 03/15/2016   Procedure: Left Heart Cath and Coronary Angiography;  Surgeon: [name], MD;  Location: [facility];  Service: Cardiovascular;  Laterality: N/A;   INSERT / REPLACE / REMOVE PACEMAKER     PACEMAKER IMPLANT N/A 07/21/2020   Procedure: PACEMAKER IMPLANT;  Surgeon: Sanda Klein, MD;   Location: [facility];  Service: Cardiovascular;  Laterality: N/A;   TOTAL KNEE ARTHROPLASTY Right 12/27/2016   Procedure: RIGHT TOTAL KNEE ARTHROPLASTY;  Surgeon: Gaynelle Arabian, MD;  Location: [facility];  Service: Orthopedics;  Laterality: Right;   VAGINAL HYSTERECTOMY  1983   endometriosis    Family History  Problem Relation Age of Onset   Heart failure Mother    Heart disease Mother    Heart failure Father    High blood pressure Father    Stroke Father    Heart disease Father    Heart failure Maternal Grandmother    Myasthenia gravis Brother    Stroke Brother    Clotting disorder Neg Hx    Colon cancer Neg Hx     Social History   Socioeconomic History   Marital status: Widowed    Spouse name: Not on file   Number of children: Not on file   Years of education: Not on file   Highest education level: Not on file  Occupational History   Occupation: retired  Tobacco Use   Smoking status: Never    Passive exposure: Never   Smokeless tobacco: Never  Vaping Use   Vaping Use: Never used  Substance and Sexual Activity   Alcohol use: No    Alcohol/week: 0.0 standard drinks of alcohol   Drug use: No   Sexual activity: Never    Birth control/protection: Surgical    Comment: 1st intercourse 81 yo-Fewer than 5 partners  Other Topics Concern   Not on file  Social History Narrative   [facility]:   Originally from [place]. Moved to [place] in 2012. 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   Right handed   No caffeine use   Social Determinants of Health   Financial Resource Strain: Not on file  Food Insecurity: Not on file  Transportation Needs: Not on file  Physical Activity: Not on file  Stress: Not on file  Social Connections: Not on file  Intimate Partner Violence: Not on file      Review of systems: All other review of systems negative except as mentioned in the HPI.   Physical Exam: Vitals:    02/04/22 1019  BP: 130/80  Pulse: 65   Body mass index is 33.39 kg/m. Gen:      No acute distress HEENT:  sclera anicteric Abd:      soft, non-tender; no palpable masses, no distension Ext:    No edema Neuro: alert and oriented x 3 Psych: normal mood and affect  Data Reviewed:  Reviewed labs, radiology imaging, old records and pertinent past GI work up   Assessment and Plan/Recommendations:  81 year old very pleasant female with multiple comorbidities, hypertension, hypothyroidism, hyperlipidemia, CAD, sinus node dysfunction, paroxysmal A-fib, TIA on chronic anticoagulation with Eliquis with intermittent bright red blood per rectum  On anorectal exam, has anal fissures Twice patient to avoid excessive moisture buildup or using wet  wipes in the anorectal area  Advised her to start using small pea-sized amount of nitroglycerin 0.125% 3 times daily for 8 weeks Benefiber 1 teaspoon 3 times daily with meals  Follow-up office visit to document healing and resolution of rectal bleeding in 2 to 3 months   The patient was provided an opportunity to ask questions and all were answered. The patient agreed with the plan and demonstrated an understanding of the instructions.  Damaris Hippo , MD    CC: Kathyrn Lass, MD

## 2022-02-04 NOTE — Patient Instructions (Addendum)
_______________________________________________________  If you are age 81 or older, your body mass index should be between 23-30. Your Body mass index is 33.39 kg/m. If this is out of the aforementioned range listed, please consider follow up with your Primary Care Provider. _______________________________________________________  The [facility] providers would like to encourage you to use [facility] to communicate with providers for non-urgent requests or questions.  Due to long hold times on the telephone, sending your provider a message by [facility] may be a faster and more efficient way to get a response.  Please allow 48 business hours for a response.  Please remember that this is for non-urgent requests.  _______________________________________________________  We have sent a prescription for nitroglycerin 0.125% gel to [facility]. You should apply a pea size amount to your rectum three times daily x 8 weeks.    [facility]'s information is below: Address: [address], [place], [place] [address]  [phone]  *Please DO NOT go directly from our office to pick up this medication! Give the pharmacy 1 day to process the prescription as this is compounded and takes time to make.   Please purchase the following medications over the counter and take as directed:  START: benefiber 1 tablespoon three times daily with meals  You will need a follow up appointment 3 months (January 2024).  We will contact you to schedule this appointment.  Thank you for entrusting me with your care and choosing [facility].  Dr Silverio Decamp

## 2022-02-17 NOTE — Progress Notes (Signed)
Remote pacemaker transmission.   

## 2022-03-02 DIAGNOSIS — H524 Presbyopia: Secondary | ICD-10-CM | POA: Diagnosis not present

## 2022-03-02 DIAGNOSIS — H04123 Dry eye syndrome of bilateral lacrimal glands: Secondary | ICD-10-CM | POA: Diagnosis not present

## 2022-03-05 ENCOUNTER — Encounter: Payer: Self-pay | Admitting: Gastroenterology

## 2022-04-13 ENCOUNTER — Other Ambulatory Visit: Payer: Self-pay | Admitting: Cardiovascular Disease

## 2022-05-04 ENCOUNTER — Ambulatory Visit: Payer: PPO | Attending: Cardiovascular Disease

## 2022-05-04 ENCOUNTER — Telehealth: Payer: Self-pay

## 2022-05-04 DIAGNOSIS — I495 Sick sinus syndrome: Secondary | ICD-10-CM | POA: Diagnosis not present

## 2022-05-04 LAB — CUP PACEART REMOTE DEVICE CHECK
Battery Remaining Longevity: 138 mo
Battery Remaining Percentage: 100 %
Brady Statistic RA Percent Paced: 61 %
Brady Statistic RV Percent Paced: 0 %
Date Time Interrogation Session: 20240116020200
Implantable Lead Connection Status: 753985
Implantable Lead Connection Status: 753985
Implantable Lead Implant Date: 20220406
Implantable Lead Implant Date: 20220406
Implantable Lead Location: 753859
Implantable Lead Location: 753860
Implantable Lead Model: 7840
Implantable Lead Model: 7841
Implantable Lead Serial Number: 1037262
Implantable Lead Serial Number: 1127954
Implantable Pulse Generator Implant Date: 20220406
Lead Channel Impedance Value: 591 Ohm
Lead Channel Impedance Value: 868 Ohm
Lead Channel Pacing Threshold Amplitude: 0.6 V
Lead Channel Pacing Threshold Amplitude: 0.6 V
Lead Channel Pacing Threshold Pulse Width: 0.4 ms
Lead Channel Pacing Threshold Pulse Width: 0.4 ms
Lead Channel Setting Pacing Amplitude: 3.5 V
Lead Channel Setting Pacing Amplitude: 3.5 V
Lead Channel Setting Pacing Pulse Width: 0.4 ms
Lead Channel Setting Sensing Sensitivity: 2.5 mV
Pulse Gen Serial Number: 973497
Zone Setting Status: 755011

## 2022-05-04 NOTE — Telephone Encounter (Signed)
Left message on voicemail for patient to return call to schedule follow up appointment with Dr Silverio Decamp to evaluate 3 anal fissure, BRBPR.  Will continue efforts.

## 2022-05-13 NOTE — Telephone Encounter (Signed)
Left message on voicemail for patient to return call to schedule follow up appointment with Dr Silverio Decamp to evaluate anal fissure, BRBPR.  Will continue efforts.

## 2022-05-14 NOTE — Telephone Encounter (Signed)
Left message on voicemail for patient to return call to schedule follow up appointment with Dr Silverio Decamp to evaluate anal fissure, BRBPR. Will send appointment reminder letter to remind patient she needs to follow up as I have tried several times to reach the patient with no success.

## 2022-05-20 ENCOUNTER — Other Ambulatory Visit: Payer: Self-pay

## 2022-05-20 DIAGNOSIS — I480 Paroxysmal atrial fibrillation: Secondary | ICD-10-CM

## 2022-05-20 MED ORDER — APIXABAN 5 MG PO TABS: 5.0000 mg | ORAL_TABLET | Freq: Two times a day (BID) | ORAL | 3 refills | Status: DC

## 2022-05-20 NOTE — Telephone Encounter (Signed)
Prescription refill request for Michele Thompson received. Indication: Afib  Last office visit: 10/29/21 ([place])  Scr: 0.86 (07/20/21)  Age: 82 Weight: 85.5kg Appropriate dose. Refill sent.

## 2022-05-28 NOTE — Progress Notes (Signed)
Remote pacemaker transmission.   

## 2022-06-07 ENCOUNTER — Other Ambulatory Visit: Payer: Self-pay | Admitting: Cardiovascular Disease

## 2022-07-08 ENCOUNTER — Other Ambulatory Visit: Payer: Self-pay | Admitting: Cardiovascular Disease

## 2022-07-22 DIAGNOSIS — I700 Atherosclerosis of aorta: Secondary | ICD-10-CM | POA: Diagnosis not present

## 2022-07-22 DIAGNOSIS — K760 Fatty (change of) liver, not elsewhere classified: Secondary | ICD-10-CM | POA: Diagnosis not present

## 2022-07-22 DIAGNOSIS — Z8673 Personal history of transient ischemic attack (TIA), and cerebral infarction without residual deficits: Secondary | ICD-10-CM | POA: Diagnosis not present

## 2022-07-22 DIAGNOSIS — Z86711 Personal history of pulmonary embolism: Secondary | ICD-10-CM | POA: Diagnosis not present

## 2022-07-22 DIAGNOSIS — E890 Postprocedural hypothyroidism: Secondary | ICD-10-CM | POA: Diagnosis not present

## 2022-07-22 DIAGNOSIS — E785 Hyperlipidemia, unspecified: Secondary | ICD-10-CM | POA: Diagnosis not present

## 2022-07-22 DIAGNOSIS — I10 Essential (primary) hypertension: Secondary | ICD-10-CM | POA: Diagnosis not present

## 2022-07-22 DIAGNOSIS — I2510 Atherosclerotic heart disease of native coronary artery without angina pectoris: Secondary | ICD-10-CM | POA: Diagnosis not present

## 2022-07-22 DIAGNOSIS — I4891 Unspecified atrial fibrillation: Secondary | ICD-10-CM | POA: Diagnosis not present

## 2022-07-22 DIAGNOSIS — Z7901 Long term (current) use of anticoagulants: Secondary | ICD-10-CM | POA: Diagnosis not present

## 2022-08-03 ENCOUNTER — Ambulatory Visit (INDEPENDENT_AMBULATORY_CARE_PROVIDER_SITE_OTHER): Payer: PPO

## 2022-08-03 DIAGNOSIS — I495 Sick sinus syndrome: Secondary | ICD-10-CM | POA: Diagnosis not present

## 2022-08-04 LAB — CUP PACEART REMOTE DEVICE CHECK
Battery Remaining Longevity: 126 mo
Battery Remaining Percentage: 100 %
Brady Statistic RA Percent Paced: 60 %
Brady Statistic RV Percent Paced: 0 %
Date Time Interrogation Session: 20240416020200
Implantable Lead Connection Status: 753985
Implantable Lead Connection Status: 753985
Implantable Lead Implant Date: 20220406
Implantable Lead Implant Date: 20220406
Implantable Lead Location: 753859
Implantable Lead Location: 753860
Implantable Lead Model: 7840
Implantable Lead Model: 7841
Implantable Lead Serial Number: 1037262
Implantable Lead Serial Number: 1127954
Implantable Pulse Generator Implant Date: 20220406
Lead Channel Impedance Value: 495 Ohm
Lead Channel Impedance Value: 751 Ohm
Lead Channel Pacing Threshold Amplitude: 0.5 V
Lead Channel Pacing Threshold Amplitude: 0.7 V
Lead Channel Pacing Threshold Pulse Width: 0.4 ms
Lead Channel Pacing Threshold Pulse Width: 0.4 ms
Lead Channel Setting Pacing Amplitude: 3.5 V
Lead Channel Setting Pacing Amplitude: 3.5 V
Lead Channel Setting Pacing Pulse Width: 0.4 ms
Lead Channel Setting Sensing Sensitivity: 2.5 mV
Pulse Gen Serial Number: 973497
Zone Setting Status: 755011

## 2022-08-09 ENCOUNTER — Encounter: Payer: Self-pay | Admitting: Cardiovascular Disease

## 2022-08-11 DIAGNOSIS — Z6833 Body mass index (BMI) 33.0-33.9, adult: Secondary | ICD-10-CM | POA: Diagnosis not present

## 2022-08-11 DIAGNOSIS — N1831 Chronic kidney disease, stage 3a: Secondary | ICD-10-CM | POA: Diagnosis not present

## 2022-08-18 DIAGNOSIS — D6869 Other thrombophilia: Secondary | ICD-10-CM | POA: Diagnosis not present

## 2022-08-18 DIAGNOSIS — E559 Vitamin D deficiency, unspecified: Secondary | ICD-10-CM | POA: Diagnosis not present

## 2022-08-18 DIAGNOSIS — N1831 Chronic kidney disease, stage 3a: Secondary | ICD-10-CM | POA: Diagnosis not present

## 2022-08-18 DIAGNOSIS — E669 Obesity, unspecified: Secondary | ICD-10-CM | POA: Diagnosis not present

## 2022-08-18 DIAGNOSIS — E785 Hyperlipidemia, unspecified: Secondary | ICD-10-CM | POA: Diagnosis not present

## 2022-08-18 DIAGNOSIS — I2510 Atherosclerotic heart disease of native coronary artery without angina pectoris: Secondary | ICD-10-CM | POA: Diagnosis not present

## 2022-08-18 DIAGNOSIS — E039 Hypothyroidism, unspecified: Secondary | ICD-10-CM | POA: Diagnosis not present

## 2022-08-18 DIAGNOSIS — G4700 Insomnia, unspecified: Secondary | ICD-10-CM | POA: Diagnosis not present

## 2022-08-18 DIAGNOSIS — I129 Hypertensive chronic kidney disease with stage 1 through stage 4 chronic kidney disease, or unspecified chronic kidney disease: Secondary | ICD-10-CM | POA: Diagnosis not present

## 2022-08-18 DIAGNOSIS — I10 Essential (primary) hypertension: Secondary | ICD-10-CM | POA: Diagnosis not present

## 2022-08-18 DIAGNOSIS — G8929 Other chronic pain: Secondary | ICD-10-CM | POA: Diagnosis not present

## 2022-08-18 DIAGNOSIS — I4891 Unspecified atrial fibrillation: Secondary | ICD-10-CM | POA: Diagnosis not present

## 2022-08-18 DIAGNOSIS — N1832 Chronic kidney disease, stage 3b: Secondary | ICD-10-CM | POA: Diagnosis not present

## 2022-08-25 DIAGNOSIS — Z6833 Body mass index (BMI) 33.0-33.9, adult: Secondary | ICD-10-CM | POA: Diagnosis not present

## 2022-08-25 DIAGNOSIS — I10 Essential (primary) hypertension: Secondary | ICD-10-CM | POA: Diagnosis not present

## 2022-08-25 DIAGNOSIS — Z780 Asymptomatic menopausal state: Secondary | ICD-10-CM | POA: Diagnosis not present

## 2022-08-25 DIAGNOSIS — Z23 Encounter for immunization: Secondary | ICD-10-CM | POA: Diagnosis not present

## 2022-08-25 DIAGNOSIS — Z0000 Encounter for general adult medical examination without abnormal findings: Secondary | ICD-10-CM | POA: Diagnosis not present

## 2022-08-26 ENCOUNTER — Other Ambulatory Visit: Payer: Self-pay | Admitting: Pain Medicine

## 2022-08-26 DIAGNOSIS — Z780 Asymptomatic menopausal state: Secondary | ICD-10-CM

## 2022-08-30 ENCOUNTER — Encounter: Payer: Self-pay | Admitting: Cardiovascular Disease

## 2022-08-30 ENCOUNTER — Telehealth: Payer: Self-pay | Admitting: Cardiovascular Disease

## 2022-08-30 NOTE — Telephone Encounter (Signed)
All those readings are a little high (even the 125/85 - the bottom number is borderline). Please increase the HCTZ to a 25 mg dose daily. This medication is slow acting so may take up to 10-14 days to see the full impact on BP. Please make sure to check the BP when fully relaxed and sitting down for at least 10-15 minutes. Please check a BMET in 4-5 weeks on the new HCTZ dose.

## 2022-08-30 NOTE — Telephone Encounter (Signed)
  Per MyChart scheduling message:  [name] been keeping track of my blood pressure for last five days. It's consistently too high. I don't know how to get it down. Now, at 4:00 am just got up and reading is 186/91. Shouldn't it come down when I'm resting ? On the 8th it was 192/98. It lowers to 125/85 and then spikes for reasons I don't understand. Dr. Rubie Maid is my cardiologist. I think I need immediate help. Thank you. Michele Thompson

## 2022-08-31 ENCOUNTER — Encounter: Payer: Self-pay | Admitting: Cardiovascular Disease

## 2022-08-31 ENCOUNTER — Ambulatory Visit: Payer: PPO | Attending: General Practice | Admitting: General Practice

## 2022-08-31 ENCOUNTER — Encounter: Payer: Self-pay | Admitting: General Practice

## 2022-08-31 VITALS — BP 132/78 | HR 60 | Ht 64.75 in | Wt 186.8 lb

## 2022-08-31 DIAGNOSIS — I25111 Atherosclerotic heart disease of native coronary artery with angina pectoris with documented spasm: Secondary | ICD-10-CM | POA: Diagnosis not present

## 2022-08-31 DIAGNOSIS — I10 Essential (primary) hypertension: Secondary | ICD-10-CM

## 2022-08-31 DIAGNOSIS — E7800 Pure hypercholesterolemia, unspecified: Secondary | ICD-10-CM

## 2022-08-31 DIAGNOSIS — I480 Paroxysmal atrial fibrillation: Secondary | ICD-10-CM

## 2022-08-31 MED ORDER — HYDROCHLOROTHIAZIDE 12.5 MG PO TABS: 25.0000 mg | ORAL_TABLET | Freq: Every day | ORAL | 0 refills | Status: DC

## 2022-08-31 MED ORDER — DILTIAZEM HCL 60 MG PO TABS: 60.0000 mg | ORAL_TABLET | Freq: Every day | ORAL | 4 refills | Status: DC

## 2022-08-31 NOTE — Telephone Encounter (Signed)
LM to please call our office for recommendations

## 2022-08-31 NOTE — Patient Instructions (Addendum)
Medication Instructions:  INCREASE DILTIAZEM 60MG  DAILY; IF THIS DOES NOT WORK MAY TAKE ANOTHER 30MG  IN 1 HOUR; IF THIS STILL DOES NOT WORK MAY TAKE ANOTHER 30MG  IN ANOTHER 1 HOUR. CALL WITH ANY OTHER ISSUES. *If you need a refill on your cardiac medications before your next appointment, please call your pharmacy*  Lab Work: NONE If you have labs (blood work) drawn today and your tests are completely normal, you will receive your results only by: MyChart Message (if you have MyChart) OR A paper copy in the mail If you have any lab test that is abnormal or we need to change your treatment, we will call you to review the results.  Testing/Procedures: NONE  Follow-Up: At [facility], you and your health needs are our priority.  As part of our continuing mission to provide you with exceptional heart care, we have created designated Provider Care Teams.  These Care Teams include your primary Cardiologist (physician) and Advanced Practice Providers (APPs -  Physician Assistants and Nurse Practitioners) who all work together to provide you with the care you need, when you need it.  Your next appointment:   3 month(s)  Provider:   Thurmon Fair, MD     Other Instructions REPLACE YOUR BATTERIES IN YOU BLOOD PRESSURE MACHINE.  Please try to avoid these triggers: Do not use any products that have nicotine or tobacco in them. These include cigarettes, e-cigarettes, and chewing tobacco. If you need help quitting, ask your doctor. Eat heart-healthy foods. Talk with your doctor about the right eating plan for you. Exercise regularly as told by your doctor. Stay hydrated Do not drink alcohol, Caffeine or chocolate. Lose weight if you are overweight. Do not use drugs, including cannabis  ALSO: Hormonal contraceptives, steroids, antidepressants, weight loss medications, NSAIDs,  alcohol, caffeine, licorice, ginseng, St. John's wort

## 2022-08-31 NOTE — Telephone Encounter (Signed)
  Gave patient information and she will increase her dose as stated.  She does make complaint that she believes she did this in the past before seeing Dr C. And she complains she has to urinate all the time already, but she will try the medication.  She states other questions but see she has appt today with NP.  She states because Dr C wasn't responding to her.  Advised she was told he was out of office when she made her calls and messages (multiple) and that she did receive messages from him in a good response time.  Advised to keep her appt today since she still has many questions.  She will

## 2022-08-31 NOTE — Progress Notes (Addendum)
Cardiology Clinic Note   Patient Name: Michele Thompson Date of Encounter: 08/31/2022  Primary Care Provider:  Sigmund Hazel, MD Primary Cardiologist:  Thurmon Fair, MD  Patient Profile    Michele Thompson 83 year old female presents the clinic today for follow-up evaluation of her atrial fibrillation and essential hypertension.  Past Medical History    Past Medical History:  Diagnosis Date   Anxiety    Aortic atherosclerosis (HCC)    Arthritis    Constipation    Coronary artery disease 03/16/2016   a. admx with angina and mildly elevated Troponin >> LHC: mLAD 30 >> CCB started for poss microvascular angina   Depression    Diverticulosis    Edema of both lower extremities    Fatty liver    History of MI (myocardial infarction)    Hyperlipidemia 03/16/2016   Hypertension    Hypothyroidism    Joint pain    Pacemaker    Pacemaker    Paroxysmal A-fib (HCC)    PE (pulmonary thromboembolism) (HCC) 03/17/2016   Bilateral submassive pulmonary embolus with RUE and RLE DVT on venous duplex 02/2016 c/b PEA arrest // Echo 03/21/16: mild LVH, EF 60-65, no RWMA, Gr 1 DD, normal RVSF   Psoas tendinitis    Sinus bradycardia 03/31/2016   Thrombocytopenia (HCC) 02/2016   Profound thrombocytopenia noted upon admission for pulmonary embolism >> initially thought to be from Hep induced thrombocytopenia given recent admit for cardiac cath and Heparin use // However, HIT Ab was neg (0.184) // Hematology consult pending   Thyroid disease    Unilateral primary osteoarthritis, right knee    Vascular malformation    mid ascending colon   Vitamin D deficiency    Past Surgical History:  Procedure Laterality Date   CARDIAC CATHETERIZATION N/A 03/15/2016   Procedure: Left Heart Cath and Coronary Angiography;  Surgeon: Tonny Bollman, MD;  Location: [facility] INVASIVE CV LAB;  Service: Cardiovascular;  Laterality: N/A;   INSERT / REPLACE / REMOVE PACEMAKER     PACEMAKER IMPLANT N/A 07/21/2020   Procedure:  PACEMAKER IMPLANT;  Surgeon: Thurmon Fair, MD;  Location: [facility] INVASIVE CV LAB;  Service: Cardiovascular;  Laterality: N/A;   TOTAL KNEE ARTHROPLASTY Right 12/27/2016   Procedure: RIGHT TOTAL KNEE ARTHROPLASTY;  Surgeon: Ollen Gross, MD;  Location: [facility] ORS;  Service: Orthopedics;  Laterality: Right;   VAGINAL HYSTERECTOMY  1983   endometriosis    Allergies  No Known Allergies  History of Present Illness    Michele Thompson has a PMH of paroxysmal atrial fibrillation, sick sinus syndrome, PPM, coronary artery disease, history of PE, HTN, and HLD.  She was taking estrogen therapy in 2017.  She acquired a large pulmonary embolism which was complicated by cardiopulmonary arrest.  She was noted to be in atrial fibrillation and underwent cardioversion 8/19.  She is felt to have minor coronary artery disease and suspicion for coronary vasospasm on a prior angiography.  She wore a cardiac event monitor which showed evidence of sinus node dysfunction and chronotropic incompetence while taking beta-blockers.  She has been noted to have normal LV systolic function via echocardiogram and had a normal pulmonary function test in 2018.  She underwent implant of dual-chamber PPM 4/22.  She followed up with Dr. Royann Shivers 10/29/2021.  She had a PPM adjustment to her rate response sensor and reported feeling better.  She started playing golf again.  She did note that when she was around home #6 she would tire and require  rest.  She denied shortness of breath and chest pain.  Her pacemaker interrogation showed normal findings, battery life 12 years, lead stable, 62% atrial pacing, less than 1% A-fib burden.  She reported that she had not needed to use her prescription of as needed immediate release diltiazem.  She contacted nurse triage line on 08/30/2022.  She reported that her blood pressure was elevated and that she also wanted to discuss her atrial fibrillation.  She presents to the clinic today for follow-up  evaluation and states she is concerned about her blood pressure.  She has been noticing readings in 160-180 systolic range.  Some of these systolic blood pressures are as soon as she gets out of bed.  We compared her blood pressure cuff to our manual cuff and noted a 40 point difference.  I instructed her to change the batteries on her monitor and that she may potentially need to replace her monitor.  She expressed understanding.  She notes that when she is in atrial fibrillation she will take her immediate release diltiazem.  However, she does sometimes take 30 mg in close sequence.  She may sometimes take 90 mg in a short period of time.  I recommended that she take 60 mg initially and then take 30 mg as prescribed for her continued palpitations.  She expressed understanding.  We reviewed triggers for atrial fibrillation.  We will plan follow-up in 3 to 4 months.  Today she denies chest pain, shortness of breath, lower extremity edema, fatigue,  melena, hematuria, hemoptysis, diaphoresis, weakness, presyncope, syncope, orthopnea, and PND.   Home Medications    Prior to Admission medications   Medication Sig Start Date End Date Taking? Authorizing Provider  AMBULATORY NON FORMULARY MEDICATION Nitroglycerine ointment 0.125 %  Apply a pea sized amount internally three times daily for 8 weeks. 02/04/22   Napoleon Form, MD  apixaban (ELIQUIS) 5 MG TABS tablet Take 1 tablet (5 mg total) by mouth 2 (two) times daily. 05/20/22   Croitoru, Mihai, MD  Blood Pressure Monitoring (OMRON 7 SERIES BP MONITOR) DEVI See admin instructions. 07/15/21   [provider]  Cholecalciferol (VITAMIN D3) 2000 units TABS Take 2,000 Units by mouth daily.    [provider]  diltiazem (CARDIZEM) 30 MG tablet Take 1 tablet (30 mg total) by mouth 3 (three) times daily as needed (rapid palpitations). 06/07/22   Croitoru, Mihai, MD  hydrochlorothiazide (HYDRODIURIL) 12.5 MG tablet Take 1 tablet (12.5 mg total)  by mouth daily. 10/29/21   Croitoru, Mihai, MD  levothyroxine (SYNTHROID) 100 MCG tablet Take 100 mcg by mouth every morning. 01/27/21   [provider]  lisinopril (ZESTRIL) 40 MG tablet TAKE ONE TABLET BY MOUTH EVERY DAY 07/09/22   Croitoru, Mihai, MD  rosuvastatin (CRESTOR) 20 MG tablet Take 1 tablet (20 mg total) by mouth daily. 04/13/22   Croitoru, Mihai, MD  Wheat Dextrin (BENEFIBER) POWD Take 1 tablespoon three times daily with meals. 02/04/22   Napoleon Form, MD    Family History    Family History  Problem Relation Age of Onset   Heart failure Mother    Heart disease Mother    Heart failure Father    High blood pressure Father    Stroke Father    Heart disease Father    Heart failure Maternal Grandmother    Myasthenia gravis Brother    Stroke Brother    Clotting disorder Neg Hx    Colon cancer Neg Hx    She  indicated that her mother is deceased. She indicated that her father is deceased. She indicated that one of her two brothers is deceased. She indicated that her maternal grandmother is deceased. She indicated that her maternal grandfather is deceased. She indicated that her paternal grandmother is deceased. She indicated that her paternal grandfather is deceased. She indicated that the status of her neg hx is unknown.  Social History    Social History   Socioeconomic History   Marital status: Widowed    Spouse name: Not on file   Number of children: Not on file   Years of education: Not on file   Highest education level: Not on file  Occupational History   Occupation: retired  Tobacco Use   Smoking status: Never    Passive exposure: Never   Smokeless tobacco: Never  Vaping Use   Vaping Use: Never used  Substance and Sexual Activity   Alcohol use: No    Alcohol/week: 0.0 standard drinks of alcohol   Drug use: No   Sexual activity: Never    Birth control/protection: Surgical    Comment: 1st intercourse 82 yo-Fewer than 5 partners  Other Topics  Concern   Not on file  Social History Narrative   [facility]:   Originally from [place]. Moved to [place] in 2012. Worked previously in Materials engineer. Son is healthcare power of attorney.   Right handed   No caffeine use   Social Determinants of Health   Financial Resource Strain: Not on file  Food Insecurity: Not on file  Transportation Needs: Not on file  Physical Activity: Not on file  Stress: Not on file  Social Connections: Not on file  Intimate Partner Violence: Not on file     Review of Systems    General:  No chills, fever, night sweats or weight changes.  Cardiovascular:  No chest pain, dyspnea on exertion, edema, orthopnea, palpitations, paroxysmal nocturnal dyspnea. Dermatological: No rash, lesions/masses Respiratory: No cough, dyspnea Urologic: No hematuria, dysuria Abdominal:   No nausea, vomiting, diarrhea, bright red blood per rectum, melena, or hematemesis Neurologic:  No visual changes, wkns, changes in mental status. All other systems reviewed and are otherwise negative except as noted above.  Physical Exam    VS:  BP 132/78   Pulse 60   Ht 5' 4.75" (1.645 m)   Wt 186 lb 12.8 oz (84.7 kg)   SpO2 98%   BMI 31.33 kg/m  , BMI Body mass index is 31.33 kg/m. GEN: Well nourished, well developed, in no acute distress. HEENT: normal. Neck: Supple, no JVD, carotid bruits, or masses. Cardiac: RRR, no murmurs, rubs, or gallops. No clubbing, cyanosis, edema.  Radials/DP/PT 2+ and equal bilaterally.  Respiratory:  Respirations regular and unlabored, clear to auscultation bilaterally. GI: Soft, nontender, nondistended, BS + x 4. MS: no deformity or atrophy. Skin: warm and dry, no rash. Neuro:  Strength and sensation are intact. Psych: Normal affect.  Accessory Clinical Findings    Recent Labs: No results found for requested labs within last 365 days.   Recent Lipid Panel    Component Value Date/Time   CHOL 97 06/29/2021 0243    CHOL 113 02/26/2019 0858   TRIG 41 06/29/2021 0243   HDL 50 06/29/2021 0243   HDL 56 02/26/2019 0858   CHOLHDL 1.9 06/29/2021 0243   VLDL 8 06/29/2021 0243   LDLCALC 39 06/29/2021 0243   LDLCALC 44 02/26/2019 0858         ECG personally  reviewed by me today-atrial paced ventricular sensed 60 bpm- No acute changes  Device interrogation 08/03/2022  Battery status good, lead placement stable.  Low preference of atrial fibrillation less than 1% with longest episode lasting 2 hours and mediocre rate control.  Echocardiogram 06/29/2021  IMPRESSIONS     1. Left ventricular ejection fraction, by estimation, is 60 to 65%. The  left ventricle has normal function. The left ventricle has no regional  wall motion abnormalities. Left ventricular diastolic parameters are  consistent with Grade I diastolic  dysfunction (impaired relaxation).   2. Right ventricular systolic function is normal. The right ventricular  size is normal.   3. The mitral valve is normal in structure. Trivial mitral valve  regurgitation. No evidence of mitral stenosis.   4. The aortic valve is normal in structure. Aortic valve regurgitation is  not visualized. Aortic valve sclerosis is present, with no evidence of  aortic valve stenosis. Aortic valve area, by VTI measures 2.55 cm. Aortic  valve mean gradient measures 3.0  mmHg. Aortic valve Vmax measures 1.28 m/s.   5. The inferior vena cava is normal in size with greater than 50%  respiratory variability, suggesting right atrial pressure of 3 mmHg.   FINDINGS   Left Ventricle: Left ventricular ejection fraction, by estimation, is 60  to 65%. The left ventricle has normal function. The left ventricle has no  regional wall motion abnormalities. The left ventricular internal cavity  size was normal in size. There is   no left ventricular hypertrophy. Left ventricular diastolic parameters  are consistent with Grade I diastolic dysfunction (impaired relaxation).   Normal left ventricular filling pressure.   Right Ventricle: The right ventricular size is normal. No increase in  right ventricular wall thickness. Right ventricular systolic function is  normal.   Left Atrium: Left atrial size was normal in size.   Right Atrium: Right atrial size was normal in size.   Pericardium: There is no evidence of pericardial effusion.   Mitral Valve: The mitral valve is normal in structure. Trivial mitral  valve regurgitation. No evidence of mitral valve stenosis.   Tricuspid Valve: The tricuspid valve is normal in structure. Tricuspid  valve regurgitation is trivial. No evidence of tricuspid stenosis.   Aortic Valve: The aortic valve is normal in structure. Aortic valve  regurgitation is not visualized. Aortic valve sclerosis is present, with  no evidence of aortic valve stenosis. Aortic valve mean gradient measures  3.0 mmHg. Aortic valve peak gradient  measures 6.6 mmHg. Aortic valve area, by VTI measures 2.55 cm.   Pulmonic Valve: The pulmonic valve was normal in structure. Pulmonic valve  regurgitation is not visualized. No evidence of pulmonic stenosis.   Aorta: The aortic root is normal in size and structure.   Venous: The inferior vena cava is normal in size with greater than 50%  respiratory variability, suggesting right atrial pressure of 3 mmHg.   IAS/Shunts: No atrial level shunt detected by color flow Doppler.    Assessment & Plan   1.  Essential hypertension-BP today 132/78. Maintain blood pressure log Low-sodium diet Continue lisinopril Increase HCTZ to 25 mg daily Repeat BMP in 2 weeks.  Paroxysmal atrial fibrillation-EKG today shows junctional rhythm 60 bpm.  Has only occasionally needed to use her immediate release diltiazem however, she reports that she sometimes takes 30 mg fairly close together x 3.  Reports compliance with apixaban.  Denies bleeding issues. Continue apixaban,  May take a 60 mg diltiazem initial dose  followed  by 30 mg dosing as prescribed Avoid triggers caffeine, chocolate, dehydration etc. Maintain physical activity  Coronary artery disease-no chest pain today.  Denies recent episodes of arm neck back or chest discomfort.  Underwent cardiac catheterization which showed minor coronary disease.  There was suspicion for coronary vasospasm. Continue rosuvastatin, lisinopril High-fiber diet  Hyperlipidemia-LDL 44 on 07/22/22 Continue rosuvastatin High-fiber diet Maintain physical activity  Disposition: Follow-up with Dr. Royann Shivers or me in 3 months.   Thomasene Ripple.  NP-C     08/31/2022, 2:47 PM [facility] [address] Office [phone] Fax [phone]    I spent 15 minutes examining this patient, reviewing medications, and using patient centered shared decision making involving her cardiac care.  Prior to her visit I spent greater than 20 minutes reviewing her past medical history,  medications, and prior cardiac tests.

## 2022-09-06 NOTE — Progress Notes (Signed)
Remote pacemaker transmission.   

## 2022-09-21 DIAGNOSIS — F5101 Primary insomnia: Secondary | ICD-10-CM | POA: Diagnosis not present

## 2022-09-21 DIAGNOSIS — F419 Anxiety disorder, unspecified: Secondary | ICD-10-CM | POA: Diagnosis not present

## 2022-09-21 DIAGNOSIS — D6869 Other thrombophilia: Secondary | ICD-10-CM | POA: Diagnosis not present

## 2022-09-21 DIAGNOSIS — Z6832 Body mass index (BMI) 32.0-32.9, adult: Secondary | ICD-10-CM | POA: Diagnosis not present

## 2022-09-21 DIAGNOSIS — Z950 Presence of cardiac pacemaker: Secondary | ICD-10-CM | POA: Diagnosis not present

## 2022-09-21 DIAGNOSIS — I10 Essential (primary) hypertension: Secondary | ICD-10-CM | POA: Diagnosis not present

## 2022-09-21 DIAGNOSIS — E890 Postprocedural hypothyroidism: Secondary | ICD-10-CM | POA: Diagnosis not present

## 2022-09-21 DIAGNOSIS — I480 Paroxysmal atrial fibrillation: Secondary | ICD-10-CM | POA: Diagnosis not present

## 2022-09-21 DIAGNOSIS — N951 Menopausal and female climacteric states: Secondary | ICD-10-CM | POA: Diagnosis not present

## 2022-09-21 DIAGNOSIS — I700 Atherosclerosis of aorta: Secondary | ICD-10-CM | POA: Diagnosis not present

## 2022-09-21 DIAGNOSIS — F329 Major depressive disorder, single episode, unspecified: Secondary | ICD-10-CM | POA: Diagnosis not present

## 2022-10-06 ENCOUNTER — Ambulatory Visit: Payer: PPO | Admitting: Physician Assistant

## 2022-10-27 ENCOUNTER — Other Ambulatory Visit: Payer: Self-pay | Admitting: Family Medicine

## 2022-10-27 DIAGNOSIS — Z1231 Encounter for screening mammogram for malignant neoplasm of breast: Secondary | ICD-10-CM

## 2022-11-02 ENCOUNTER — Ambulatory Visit (INDEPENDENT_AMBULATORY_CARE_PROVIDER_SITE_OTHER): Payer: PPO

## 2022-11-02 DIAGNOSIS — I495 Sick sinus syndrome: Secondary | ICD-10-CM

## 2022-11-03 LAB — CUP PACEART REMOTE DEVICE CHECK
Battery Remaining Longevity: 126 mo
Battery Remaining Percentage: 100 %
Brady Statistic RA Percent Paced: 60 %
Brady Statistic RV Percent Paced: 0 %
Date Time Interrogation Session: 20240716020200
Implantable Lead Connection Status: 753985
Implantable Lead Connection Status: 753985
Implantable Lead Implant Date: 20220406
Implantable Lead Implant Date: 20220406
Implantable Lead Location: 753859
Implantable Lead Location: 753860
Implantable Lead Model: 7840
Implantable Lead Model: 7841
Implantable Lead Serial Number: 1037262
Implantable Lead Serial Number: 1127954
Implantable Pulse Generator Implant Date: 20220406
Lead Channel Impedance Value: 495 Ohm
Lead Channel Impedance Value: 732 Ohm
Lead Channel Pacing Threshold Amplitude: 0.6 V
Lead Channel Pacing Threshold Amplitude: 0.6 V
Lead Channel Pacing Threshold Pulse Width: 0.4 ms
Lead Channel Pacing Threshold Pulse Width: 0.4 ms
Lead Channel Setting Pacing Amplitude: 3.5 V
Lead Channel Setting Pacing Amplitude: 3.5 V
Lead Channel Setting Pacing Pulse Width: 0.4 ms
Lead Channel Setting Sensing Sensitivity: 2.5 mV
Pulse Gen Serial Number: 973497
Zone Setting Status: 755011

## 2022-11-17 DIAGNOSIS — R232 Flushing: Secondary | ICD-10-CM | POA: Diagnosis not present

## 2022-11-17 DIAGNOSIS — I2510 Atherosclerotic heart disease of native coronary artery without angina pectoris: Secondary | ICD-10-CM | POA: Diagnosis not present

## 2022-11-17 DIAGNOSIS — E890 Postprocedural hypothyroidism: Secondary | ICD-10-CM | POA: Diagnosis not present

## 2022-11-17 DIAGNOSIS — I10 Essential (primary) hypertension: Secondary | ICD-10-CM | POA: Diagnosis not present

## 2022-11-17 DIAGNOSIS — I4891 Unspecified atrial fibrillation: Secondary | ICD-10-CM | POA: Diagnosis not present

## 2022-11-17 DIAGNOSIS — Z7901 Long term (current) use of anticoagulants: Secondary | ICD-10-CM | POA: Diagnosis not present

## 2022-11-18 ENCOUNTER — Encounter: Payer: Self-pay | Admitting: Cardiovascular Disease

## 2022-11-19 NOTE — Progress Notes (Signed)
Remote pacemaker transmission.   

## 2022-11-24 DIAGNOSIS — I700 Atherosclerosis of aorta: Secondary | ICD-10-CM | POA: Diagnosis not present

## 2022-11-24 DIAGNOSIS — Z0000 Encounter for general adult medical examination without abnormal findings: Secondary | ICD-10-CM | POA: Diagnosis not present

## 2022-11-24 DIAGNOSIS — Z6832 Body mass index (BMI) 32.0-32.9, adult: Secondary | ICD-10-CM | POA: Diagnosis not present

## 2022-11-24 DIAGNOSIS — E669 Obesity, unspecified: Secondary | ICD-10-CM | POA: Diagnosis not present

## 2022-12-04 ENCOUNTER — Other Ambulatory Visit: Payer: Self-pay | Admitting: Cardiovascular Disease

## 2022-12-07 ENCOUNTER — Ambulatory Visit: Payer: PPO

## 2022-12-16 ENCOUNTER — Ambulatory Visit
Admission: RE | Admit: 2022-12-16 | Discharge: 2022-12-16 | Disposition: A | Discharge status: Home / Self Care | Payer: PPO | Source: Ambulatory Visit | Attending: Family Medicine | Admitting: Family Medicine

## 2022-12-16 DIAGNOSIS — Z1231 Encounter for screening mammogram for malignant neoplasm of breast: Secondary | ICD-10-CM | POA: Diagnosis not present

## 2022-12-30 DIAGNOSIS — E668 Other obesity: Secondary | ICD-10-CM | POA: Diagnosis not present

## 2022-12-30 DIAGNOSIS — Z950 Presence of cardiac pacemaker: Secondary | ICD-10-CM | POA: Diagnosis not present

## 2022-12-30 DIAGNOSIS — I700 Atherosclerosis of aorta: Secondary | ICD-10-CM | POA: Diagnosis not present

## 2022-12-30 DIAGNOSIS — I10 Essential (primary) hypertension: Secondary | ICD-10-CM | POA: Diagnosis not present

## 2022-12-30 DIAGNOSIS — Z6832 Body mass index (BMI) 32.0-32.9, adult: Secondary | ICD-10-CM | POA: Diagnosis not present

## 2023-01-01 ENCOUNTER — Other Ambulatory Visit: Payer: Self-pay | Admitting: Cardiovascular Disease

## 2023-01-18 ENCOUNTER — Encounter: Payer: Self-pay | Admitting: Cardiovascular Disease

## 2023-01-18 ENCOUNTER — Ambulatory Visit: Payer: PPO | Attending: Cardiovascular Disease | Admitting: Cardiovascular Disease

## 2023-01-18 VITALS — BP 136/70 | HR 61 | Ht 62.0 in | Wt 177.0 lb

## 2023-01-18 DIAGNOSIS — I480 Paroxysmal atrial fibrillation: Secondary | ICD-10-CM | POA: Diagnosis not present

## 2023-01-18 DIAGNOSIS — Z950 Presence of cardiac pacemaker: Secondary | ICD-10-CM

## 2023-01-18 DIAGNOSIS — E7800 Pure hypercholesterolemia, unspecified: Secondary | ICD-10-CM | POA: Diagnosis not present

## 2023-01-18 DIAGNOSIS — I495 Sick sinus syndrome: Secondary | ICD-10-CM | POA: Diagnosis not present

## 2023-01-18 DIAGNOSIS — I10 Essential (primary) hypertension: Secondary | ICD-10-CM | POA: Diagnosis not present

## 2023-01-18 DIAGNOSIS — I25111 Atherosclerotic heart disease of native coronary artery with angina pectoris with documented spasm: Secondary | ICD-10-CM | POA: Diagnosis not present

## 2023-01-18 DIAGNOSIS — Z86711 Personal history of pulmonary embolism: Secondary | ICD-10-CM | POA: Diagnosis not present

## 2023-01-18 MED ORDER — ROSUVASTATIN CALCIUM 20 MG PO TABS: 20.0000 mg | ORAL_TABLET | Freq: Every day | ORAL | 3 refills | Status: DC

## 2023-01-18 MED ORDER — HYDROCHLOROTHIAZIDE 25 MG PO TABS: 25.0000 mg | ORAL_TABLET | Freq: Every day | ORAL | 3 refills | Status: DC

## 2023-01-18 MED ORDER — DILTIAZEM HCL 60 MG PO TABS: 60.0000 mg | ORAL_TABLET | Freq: Every day | ORAL | 3 refills | Status: AC

## 2023-01-18 NOTE — Patient Instructions (Signed)

## 2023-01-22 NOTE — Progress Notes (Signed)
.  Cardiology Office Note:    Date:  01/22/2023   ID:  Michele Thompson, DOB 24-Apr-1940, MRN [id number]  PCP:  Sigmund Hazel, MD  Cardiologist:  Thurmon Fair, MD   Referring MD: Sigmund Hazel, MD   No chief complaint on file.     History of Present Illness:    Michele Thompson is a 82 y.o. female with a hx of massive pulmonary embolism, complicated by cardiorespiratory arrest, while on estrogen therapy in 2017.  She had an episode of paroxysmal atrial fibrillation and underwent cardioversion in August 2019.  She also has minor coronary artery disease and suspicion for coronary vasospasm on previous angiography, hypertension and hyperlipidemia.  Previous Holter monitor showed evidence of sinus node dysfunction and chronotropic incompetence while taking beta-blockers.   She has normal left ventricular systolic function on echo and had normal pulmonary function test in 2018.  She underwent implantation of a dual-chamber permanent pacemaker in April 2022 (St Jude Accolade).  After pacemaker implantation she was found to have recurrent episodes of largely asymptomatic paroxysmal atrial fibrillation.  She feels well and has no cardiovascular complaints.  Exercise tolerance is stable.  She has not been aware of palpitations.  She has not had dizziness or syncope.  Denies chest pain at rest or with activity.  Pacemaker interrogation shows normal device function with normal lead parameters and an estimated generator longevity of 10 years.  Heart rate histogram distribution shows appropriate sensor settings.  She has 67% atrial pacing and virtually never has ventricular pacing.  The overall burden of atrial fibrillation is well under 1% (a total of 15 hours in the last 12 months).  Ventricular rate is mediocre during atrial fibrillation with rates of 115-140 bpm.  Recent labs show excellent metabolic parameters.  ROS:   Please see the history of present illness.    All other systems are reviewed and are  negative. Studies Reviewed: Marland Kitchen      EKG: Intracardiac electrogram shows atrial paced, ventricular sensed rhythm. Comprehensive check of the pacemaker in the office today. Risk Assessment/Calculations:    CHA2DS2-VASc Score = 4   This indicates a 4.8% annual risk of stroke. The patient's score is based upon: CHF History: 0 HTN History: 1 Diabetes History: 0 Stroke History: 0 Vascular Disease History: 0 Age Score: 2 Gender Score: 1       Recent Labs: No results found for requested labs within last 365 days.  Recent Lipid Panel    Component Value Date/Time   CHOL 97 06/29/2021 0243   CHOL 113 02/26/2019 0858   TRIG 41 06/29/2021 0243   HDL 50 06/29/2021 0243   HDL 56 02/26/2019 0858   CHOLHDL 1.9 06/29/2021 0243   VLDL 8 06/29/2021 0243   LDLCALC 39 06/29/2021 0243   LDLCALC 44 02/26/2019 0858   07/22/2022 - 11/17/2022 Cholesterol 117, HDL 53, LDL 44, triglycerides 98 Hemoglobin A1c 5.8% Hemoglobin 12.4, creatinine 0.89, potassium 4.0, ALT 7, TSH 0.31  Physical Exam:    VS:  BP 136/70 (BP Location: Left Arm, Patient Position: Sitting, Cuff Size: Large)   Pulse 61   Ht 5\' 2"  (1.575 m)   Wt 177 lb (80.3 kg)   SpO2 97%   BMI 32.37 kg/m     Wt Readings from Last 3 Encounters:  01/18/23 177 lb (80.3 kg)  08/31/22 186 lb 12.8 oz (84.7 kg)  02/04/22 188 lb 8 oz (85.5 kg)     General: Alert, oriented x3, no distress, well-healed left  subclavian pacemaker site.  Obese. Head: no evidence of trauma, PERRL, EOMI, no exophtalmos or lid lag, no myxedema, no xanthelasma; normal ears, nose and oropharynx Neck: normal jugular venous pulsations and no hepatojugular reflux; brisk carotid pulses without delay and no carotid bruits Chest: clear to auscultation, no signs of consolidation by percussion or palpation, normal fremitus, symmetrical and full respiratory excursions Cardiovascular: normal position and quality of the apical impulse, regular rhythm, normal first and second  heart sounds, no murmurs, rubs or gallops Abdomen: no tenderness or distention, no masses by palpation, no abnormal pulsatility or arterial bruits, normal bowel sounds, no hepatosplenomegaly Extremities: no clubbing, cyanosis or edema; 2+ radial, ulnar and brachial pulses bilaterally; 2+ right femoral, posterior tibial and dorsalis pedis pulses; 2+ left femoral, posterior tibial and dorsalis pedis pulses; no subclavian or femoral bruits Neurological: grossly nonfocal Psych: Normal mood and affect  ASSESSMENT:    1. Paroxysmal atrial fibrillation (HCC)   2. SSS (sick sinus syndrome) (HCC)   3. Pacemaker   4. Coronary artery disease involving native coronary artery of native heart with angina pectoris with documented spasm (HCC)   5. History of pulmonary embolism   6. Essential hypertension   7. Hypercholesterolemia       PLAN:    In order of problems listed above:  PAFib: Prevalence remains low at less than 1%.  She has not needed to take her immediate release diltiazem.  CHA2DS2-Vasc score 5 (age, CAD, HTN, female).  I still think it is unnecessary to start true antiarrhythmic therapy since the burden of arrhythmia is so low.   SSS: Improved exercise tolerance after the rate response settings were adjusted last year. Pacemaker: Roughly 60% atrial pacing, good heart rate histogram distribution. CAD: Has not had angina.  There has been a suspicion of coronary vasospasm but she is currently angina free.  Lipid profile is excellent. Hx of PE: Plan lifelong anticoagulation for her atrial fibrillation anyway.  May have some degree of residual exertional dyspnea from her massive pulmonary embolism. Anticoagulation: No bleeding complications HTN: Fair control.  Medications refilled HLP: All lipid parameters are excellent.      Medication Adjustments/Labs and Tests Ordered: Current medicines are reviewed at length with the patient today.  Concerns regarding medicines are outlined above.   No orders of the defined types were placed in this encounter.   Meds ordered this encounter  Medications   hydrochlorothiazide (HYDRODIURIL) 25 MG tablet    Sig: Take 1 tablet (25 mg total) by mouth daily.    Dispense:  90 tablet    Refill:  3   diltiazem (CARDIZEM) 60 MG tablet    Sig: Take 1 tablet (60 mg total) by mouth daily.    Dispense:  90 tablet    Refill:  3   rosuvastatin (CRESTOR) 20 MG tablet    Sig: Take 1 tablet (20 mg total) by mouth daily.    Dispense:  90 tablet    Refill:  3   Dispo: Continue remote placement downloads every 3 months.  Follow-up in 12 months.  Signed, Thurmon Fair, MD

## 2023-02-01 ENCOUNTER — Ambulatory Visit (INDEPENDENT_AMBULATORY_CARE_PROVIDER_SITE_OTHER): Payer: PPO

## 2023-02-01 DIAGNOSIS — I495 Sick sinus syndrome: Secondary | ICD-10-CM

## 2023-02-02 LAB — CUP PACEART REMOTE DEVICE CHECK
Battery Remaining Longevity: 120 mo
Battery Remaining Percentage: 100 %
Brady Statistic RA Percent Paced: 60 %
Brady Statistic RV Percent Paced: 0 %
Date Time Interrogation Session: 20241016125700
Implantable Lead Connection Status: 753985
Implantable Lead Connection Status: 753985
Implantable Lead Implant Date: 20220406
Implantable Lead Implant Date: 20220406
Implantable Lead Location: 753859
Implantable Lead Location: 753860
Implantable Lead Model: 7840
Implantable Lead Model: 7841
Implantable Lead Serial Number: 1037262
Implantable Lead Serial Number: 1127954
Implantable Pulse Generator Implant Date: 20220406
Lead Channel Impedance Value: 493 Ohm
Lead Channel Impedance Value: 728 Ohm
Lead Channel Pacing Threshold Amplitude: 0.7 V
Lead Channel Pacing Threshold Amplitude: 0.8 V
Lead Channel Pacing Threshold Pulse Width: 0.4 ms
Lead Channel Pacing Threshold Pulse Width: 0.4 ms
Lead Channel Setting Pacing Amplitude: 3.5 V
Lead Channel Setting Pacing Amplitude: 3.5 V
Lead Channel Setting Pacing Pulse Width: 0.4 ms
Lead Channel Setting Sensing Sensitivity: 2.5 mV
Pulse Gen Serial Number: 973497
Zone Setting Status: 755011

## 2023-02-03 ENCOUNTER — Encounter: Payer: Self-pay | Admitting: Cardiovascular Disease

## 2023-02-21 NOTE — Progress Notes (Signed)
Remote pacemaker transmission.   

## 2023-03-11 ENCOUNTER — Encounter: Payer: Self-pay | Admitting: Cardiovascular Disease

## 2023-03-11 ENCOUNTER — Other Ambulatory Visit: Payer: Self-pay

## 2023-03-11 DIAGNOSIS — I480 Paroxysmal atrial fibrillation: Secondary | ICD-10-CM

## 2023-03-11 MED ORDER — APIXABAN 5 MG PO TABS: 5.0000 mg | ORAL_TABLET | Freq: Two times a day (BID) | ORAL | 11 refills | Status: DC

## 2023-03-21 ENCOUNTER — Ambulatory Visit
Admission: RE | Admit: 2023-03-21 | Discharge: 2023-03-21 | Disposition: A | Discharge status: Home / Self Care | Payer: PPO | Source: Ambulatory Visit | Attending: Pain Medicine | Admitting: Pain Medicine

## 2023-03-21 DIAGNOSIS — E2839 Other primary ovarian failure: Secondary | ICD-10-CM | POA: Diagnosis not present

## 2023-03-21 DIAGNOSIS — N958 Other specified menopausal and perimenopausal disorders: Secondary | ICD-10-CM | POA: Diagnosis not present

## 2023-03-21 DIAGNOSIS — Z780 Asymptomatic menopausal state: Secondary | ICD-10-CM

## 2023-03-24 DIAGNOSIS — H524 Presbyopia: Secondary | ICD-10-CM | POA: Diagnosis not present

## 2023-03-24 DIAGNOSIS — H04123 Dry eye syndrome of bilateral lacrimal glands: Secondary | ICD-10-CM | POA: Diagnosis not present

## 2023-05-03 ENCOUNTER — Ambulatory Visit (INDEPENDENT_AMBULATORY_CARE_PROVIDER_SITE_OTHER): Payer: PPO

## 2023-05-03 DIAGNOSIS — I495 Sick sinus syndrome: Secondary | ICD-10-CM

## 2023-05-03 LAB — CUP PACEART REMOTE DEVICE CHECK
Battery Remaining Longevity: 120 mo
Battery Remaining Percentage: 100 %
Brady Statistic RA Percent Paced: 61 %
Brady Statistic RV Percent Paced: 0 %
Date Time Interrogation Session: 20250114020100
Implantable Lead Connection Status: 753985
Implantable Lead Connection Status: 753985
Implantable Lead Implant Date: 20220406
Implantable Lead Implant Date: 20220406
Implantable Lead Location: 753859
Implantable Lead Location: 753860
Implantable Lead Model: 7840
Implantable Lead Model: 7841
Implantable Lead Serial Number: 1037262
Implantable Lead Serial Number: 1127954
Implantable Pulse Generator Implant Date: 20220406
Lead Channel Impedance Value: 486 Ohm
Lead Channel Impedance Value: 745 Ohm
Lead Channel Pacing Threshold Amplitude: 0.6 V
Lead Channel Pacing Threshold Amplitude: 0.7 V
Lead Channel Pacing Threshold Pulse Width: 0.4 ms
Lead Channel Pacing Threshold Pulse Width: 0.4 ms
Lead Channel Setting Pacing Amplitude: 3.5 V
Lead Channel Setting Pacing Amplitude: 3.5 V
Lead Channel Setting Pacing Pulse Width: 0.4 ms
Lead Channel Setting Sensing Sensitivity: 2.5 mV
Pulse Gen Serial Number: 973497
Zone Setting Status: 755011

## 2023-05-14 ENCOUNTER — Encounter: Payer: Self-pay | Admitting: Cardiovascular Disease

## 2023-06-24 NOTE — Progress Notes (Signed)
 Remote pacemaker transmission.

## 2023-07-01 DIAGNOSIS — Z6832 Body mass index (BMI) 32.0-32.9, adult: Secondary | ICD-10-CM | POA: Diagnosis not present

## 2023-07-01 DIAGNOSIS — K644 Residual hemorrhoidal skin tags: Secondary | ICD-10-CM | POA: Diagnosis not present

## 2023-07-01 DIAGNOSIS — N952 Postmenopausal atrophic vaginitis: Secondary | ICD-10-CM | POA: Diagnosis not present

## 2023-07-23 DIAGNOSIS — R300 Dysuria: Secondary | ICD-10-CM | POA: Diagnosis not present

## 2023-08-02 ENCOUNTER — Ambulatory Visit: Payer: PPO

## 2023-08-02 DIAGNOSIS — I495 Sick sinus syndrome: Secondary | ICD-10-CM | POA: Diagnosis not present

## 2023-08-03 LAB — CUP PACEART REMOTE DEVICE CHECK
Battery Remaining Longevity: 120 mo
Battery Remaining Percentage: 100 %
Brady Statistic RA Percent Paced: 60 %
Brady Statistic RV Percent Paced: 0 %
Date Time Interrogation Session: 20250415020100
Implantable Lead Connection Status: 753985
Implantable Lead Connection Status: 753985
Implantable Lead Implant Date: 20220406
Implantable Lead Implant Date: 20220406
Implantable Lead Location: 753859
Implantable Lead Location: 753860
Implantable Lead Model: 7840
Implantable Lead Model: 7841
Implantable Lead Serial Number: 1037262
Implantable Lead Serial Number: 1127954
Implantable Pulse Generator Implant Date: 20220406
Lead Channel Impedance Value: 478 Ohm
Lead Channel Impedance Value: 691 Ohm
Lead Channel Pacing Threshold Amplitude: 0.5 V
Lead Channel Pacing Threshold Amplitude: 0.5 V
Lead Channel Pacing Threshold Pulse Width: 0.4 ms
Lead Channel Pacing Threshold Pulse Width: 0.4 ms
Lead Channel Setting Pacing Amplitude: 3.5 V
Lead Channel Setting Pacing Amplitude: 3.5 V
Lead Channel Setting Pacing Pulse Width: 0.4 ms
Lead Channel Setting Sensing Sensitivity: 2.5 mV
Pulse Gen Serial Number: 973497
Zone Setting Status: 755011

## 2023-08-06 ENCOUNTER — Encounter: Payer: Self-pay | Admitting: Cardiovascular Disease

## 2023-08-08 ENCOUNTER — Other Ambulatory Visit: Payer: Self-pay | Admitting: Cardiovascular Disease

## 2023-08-10 DIAGNOSIS — M25562 Pain in left knee: Secondary | ICD-10-CM | POA: Diagnosis not present

## 2023-08-11 DIAGNOSIS — I2510 Atherosclerotic heart disease of native coronary artery without angina pectoris: Secondary | ICD-10-CM | POA: Diagnosis not present

## 2023-08-11 DIAGNOSIS — D126 Benign neoplasm of colon, unspecified: Secondary | ICD-10-CM | POA: Diagnosis not present

## 2023-08-11 DIAGNOSIS — I10 Essential (primary) hypertension: Secondary | ICD-10-CM | POA: Diagnosis not present

## 2023-08-11 DIAGNOSIS — Z7901 Long term (current) use of anticoagulants: Secondary | ICD-10-CM | POA: Diagnosis not present

## 2023-08-11 DIAGNOSIS — E785 Hyperlipidemia, unspecified: Secondary | ICD-10-CM | POA: Diagnosis not present

## 2023-08-11 DIAGNOSIS — R232 Flushing: Secondary | ICD-10-CM | POA: Diagnosis not present

## 2023-08-11 DIAGNOSIS — I700 Atherosclerosis of aorta: Secondary | ICD-10-CM | POA: Diagnosis not present

## 2023-08-11 DIAGNOSIS — I4891 Unspecified atrial fibrillation: Secondary | ICD-10-CM | POA: Diagnosis not present

## 2023-08-11 DIAGNOSIS — E890 Postprocedural hypothyroidism: Secondary | ICD-10-CM | POA: Diagnosis not present

## 2023-08-11 DIAGNOSIS — Z8673 Personal history of transient ischemic attack (TIA), and cerebral infarction without residual deficits: Secondary | ICD-10-CM | POA: Diagnosis not present

## 2023-08-11 DIAGNOSIS — Z86711 Personal history of pulmonary embolism: Secondary | ICD-10-CM | POA: Diagnosis not present

## 2023-08-11 DIAGNOSIS — K760 Fatty (change of) liver, not elsewhere classified: Secondary | ICD-10-CM | POA: Diagnosis not present

## 2023-08-16 DIAGNOSIS — Z6832 Body mass index (BMI) 32.0-32.9, adult: Secondary | ICD-10-CM | POA: Diagnosis not present

## 2023-08-16 DIAGNOSIS — R399 Unspecified symptoms and signs involving the genitourinary system: Secondary | ICD-10-CM | POA: Diagnosis not present

## 2023-08-16 DIAGNOSIS — K644 Residual hemorrhoidal skin tags: Secondary | ICD-10-CM | POA: Diagnosis not present

## 2023-08-16 DIAGNOSIS — M25562 Pain in left knee: Secondary | ICD-10-CM | POA: Diagnosis not present

## 2023-09-16 NOTE — Progress Notes (Signed)
 Remote pacemaker transmission.

## 2023-09-16 NOTE — Addendum Note (Signed)
 Addended by: Lott Rouleau A on: 09/16/2023 02:52 PM   Modules accepted: Orders

## 2023-10-18 DIAGNOSIS — E890 Postprocedural hypothyroidism: Secondary | ICD-10-CM | POA: Diagnosis not present

## 2023-11-01 ENCOUNTER — Ambulatory Visit: Payer: Self-pay | Admitting: Cardiovascular Disease

## 2023-11-01 ENCOUNTER — Ambulatory Visit: Payer: PPO

## 2023-11-01 DIAGNOSIS — I495 Sick sinus syndrome: Secondary | ICD-10-CM | POA: Diagnosis not present

## 2023-11-01 LAB — CUP PACEART REMOTE DEVICE CHECK
Battery Remaining Longevity: 120 mo
Battery Remaining Percentage: 100 %
Brady Statistic RA Percent Paced: 60 %
Brady Statistic RV Percent Paced: 0 %
Date Time Interrogation Session: 20250715020100
Implantable Lead Connection Status: 753985
Implantable Lead Connection Status: 753985
Implantable Lead Implant Date: 20220406
Implantable Lead Implant Date: 20220406
Implantable Lead Location: 753859
Implantable Lead Location: 753860
Implantable Lead Model: 7840
Implantable Lead Model: 7841
Implantable Lead Serial Number: 1037262
Implantable Lead Serial Number: 1127954
Implantable Pulse Generator Implant Date: 20220406
Lead Channel Impedance Value: 511 Ohm
Lead Channel Impedance Value: 741 Ohm
Lead Channel Pacing Threshold Amplitude: 0.7 V
Lead Channel Pacing Threshold Amplitude: 0.7 V
Lead Channel Pacing Threshold Pulse Width: 0.4 ms
Lead Channel Pacing Threshold Pulse Width: 0.4 ms
Lead Channel Setting Pacing Amplitude: 3.5 V
Lead Channel Setting Pacing Amplitude: 3.5 V
Lead Channel Setting Pacing Pulse Width: 0.4 ms
Lead Channel Setting Sensing Sensitivity: 2.5 mV
Pulse Gen Serial Number: 973497
Zone Setting Status: 755011

## 2023-11-29 ENCOUNTER — Other Ambulatory Visit: Payer: Self-pay | Admitting: Family Medicine

## 2023-11-29 DIAGNOSIS — Z1231 Encounter for screening mammogram for malignant neoplasm of breast: Secondary | ICD-10-CM

## 2023-12-05 DIAGNOSIS — I10 Essential (primary) hypertension: Secondary | ICD-10-CM | POA: Diagnosis not present

## 2023-12-05 DIAGNOSIS — E785 Hyperlipidemia, unspecified: Secondary | ICD-10-CM | POA: Diagnosis not present

## 2023-12-28 ENCOUNTER — Encounter: Payer: Self-pay | Admitting: Cardiovascular Disease

## 2023-12-29 ENCOUNTER — Ambulatory Visit
Admission: RE | Admit: 2023-12-29 | Discharge: 2023-12-29 | Disposition: A | Discharge status: Home / Self Care | Source: Ambulatory Visit | Attending: Family Medicine | Admitting: Family Medicine

## 2023-12-29 DIAGNOSIS — Z1231 Encounter for screening mammogram for malignant neoplasm of breast: Secondary | ICD-10-CM

## 2024-01-25 NOTE — Progress Notes (Signed)
 Remote PPM Transmission

## 2024-01-31 ENCOUNTER — Ambulatory Visit: Payer: PPO

## 2024-01-31 DIAGNOSIS — I495 Sick sinus syndrome: Secondary | ICD-10-CM

## 2024-02-02 LAB — CUP PACEART REMOTE DEVICE CHECK
Battery Remaining Longevity: 114 mo
Battery Remaining Percentage: 100 %
Brady Statistic RA Percent Paced: 59 %
Brady Statistic RV Percent Paced: 0 %
Date Time Interrogation Session: 20251014020100
Implantable Lead Connection Status: 753985
Implantable Lead Connection Status: 753985
Implantable Lead Implant Date: 20220406
Implantable Lead Implant Date: 20220406
Implantable Lead Location: 753859
Implantable Lead Location: 753860
Implantable Lead Model: 7840
Implantable Lead Model: 7841
Implantable Lead Serial Number: 1037262
Implantable Lead Serial Number: 1127954
Implantable Pulse Generator Implant Date: 20220406
Lead Channel Impedance Value: 483 Ohm
Lead Channel Impedance Value: 686 Ohm
Lead Channel Pacing Threshold Amplitude: 0.7 V
Lead Channel Pacing Threshold Amplitude: 0.7 V
Lead Channel Pacing Threshold Pulse Width: 0.4 ms
Lead Channel Pacing Threshold Pulse Width: 0.4 ms
Lead Channel Setting Pacing Amplitude: 3.5 V
Lead Channel Setting Pacing Amplitude: 3.5 V
Lead Channel Setting Pacing Pulse Width: 0.4 ms
Lead Channel Setting Sensing Sensitivity: 2.5 mV
Pulse Gen Serial Number: 973497
Zone Setting Status: 755011

## 2024-02-03 NOTE — Progress Notes (Signed)
 Remote PPM Transmission

## 2024-02-08 ENCOUNTER — Ambulatory Visit: Payer: Self-pay | Admitting: Cardiovascular Disease

## 2024-02-13 ENCOUNTER — Encounter: Payer: Self-pay | Admitting: Cardiovascular Disease

## 2024-02-22 ENCOUNTER — Other Ambulatory Visit: Payer: Self-pay | Admitting: Cardiovascular Disease

## 2024-02-24 ENCOUNTER — Telehealth: Payer: Self-pay | Admitting: Cardiovascular Disease

## 2024-02-24 MED ORDER — LISINOPRIL 40 MG PO TABS: 40.0000 mg | ORAL_TABLET | Freq: Every day | ORAL | 1 refills | Status: DC

## 2024-02-24 MED ORDER — ROSUVASTATIN CALCIUM 20 MG PO TABS: 20.0000 mg | ORAL_TABLET | Freq: Every day | ORAL | 1 refills | Status: DC

## 2024-02-24 MED ORDER — HYDROCHLOROTHIAZIDE 25 MG PO TABS: 25.0000 mg | ORAL_TABLET | Freq: Every day | ORAL | 1 refills | Status: AC

## 2024-02-24 NOTE — Telephone Encounter (Signed)
 Pt scheduled to see Dr. Francyne 04/26/24.  Refills sent int.

## 2024-02-24 NOTE — Telephone Encounter (Signed)
*  STAT* If patient is at the pharmacy, call can be transferred to refill team.   1. Which medications need to be refilled? (please list name of each medication and dose if known)   rosuvastatin  (CRESTOR ) 20 MG tablet    lisinopril  (ZESTRIL ) 40 MG tablet    hydrochlorothiazide  (HYDRODIURIL ) 25 MG tablet    2. Would you like to learn more about the convenience, safety, & potential cost savings by using the [facility]? no   3. Are you open to using the [facility] (Type [facility]no   4. Which pharmacy/location (including street and city if local pharmacy) is medication to be sent to? [facility] [place], [place] - [address]     5. Do they need a 30 day or 90 day supply? 90 day    Pt is low on medication and has upcoming appt

## 2024-03-08 ENCOUNTER — Other Ambulatory Visit: Payer: Self-pay | Admitting: Cardiovascular Disease

## 2024-03-08 DIAGNOSIS — I480 Paroxysmal atrial fibrillation: Secondary | ICD-10-CM

## 2024-03-29 DIAGNOSIS — H04123 Dry eye syndrome of bilateral lacrimal glands: Secondary | ICD-10-CM | POA: Diagnosis not present

## 2024-03-29 DIAGNOSIS — H524 Presbyopia: Secondary | ICD-10-CM | POA: Diagnosis not present

## 2024-03-29 DIAGNOSIS — Z961 Presence of intraocular lens: Secondary | ICD-10-CM | POA: Diagnosis not present

## 2024-04-26 ENCOUNTER — Ambulatory Visit: Attending: Cardiovascular Disease | Admitting: Cardiovascular Disease

## 2024-04-26 ENCOUNTER — Encounter: Payer: Self-pay | Admitting: Cardiovascular Disease

## 2024-04-26 VITALS — BP 160/81 | HR 70 | Ht 63.0 in | Wt 188.3 lb

## 2024-04-26 DIAGNOSIS — Z86711 Personal history of pulmonary embolism: Secondary | ICD-10-CM

## 2024-04-26 DIAGNOSIS — E039 Hypothyroidism, unspecified: Secondary | ICD-10-CM | POA: Diagnosis not present

## 2024-04-26 DIAGNOSIS — E7800 Pure hypercholesterolemia, unspecified: Secondary | ICD-10-CM | POA: Diagnosis not present

## 2024-04-26 DIAGNOSIS — I480 Paroxysmal atrial fibrillation: Secondary | ICD-10-CM

## 2024-04-26 DIAGNOSIS — I495 Sick sinus syndrome: Secondary | ICD-10-CM | POA: Diagnosis not present

## 2024-04-26 DIAGNOSIS — I25111 Atherosclerotic heart disease of native coronary artery with angina pectoris with documented spasm: Secondary | ICD-10-CM | POA: Diagnosis not present

## 2024-04-26 DIAGNOSIS — D6869 Other thrombophilia: Secondary | ICD-10-CM | POA: Diagnosis not present

## 2024-04-26 DIAGNOSIS — Z950 Presence of cardiac pacemaker: Secondary | ICD-10-CM | POA: Diagnosis not present

## 2024-04-26 DIAGNOSIS — I10 Essential (primary) hypertension: Secondary | ICD-10-CM

## 2024-04-26 NOTE — Progress Notes (Signed)
 .  Cardiology Office Note:    Date:  04/26/2024   ID:  Michele Thompson, DOB 1940-12-15, MRN [id number]  PCP:  Thompson Planas, MD  Cardiologist:  Jerel Balding, MD   Referring MD: Thompson Planas, MD   Chief Complaint  Patient presents with   Pacemaker Check      History of Present Illness:    Michele Thompson is a 84 y.o. female with a hx of massive pulmonary embolism, complicated by cardiorespiratory arrest, while on estrogen therapy in 2017.  She had an episode of paroxysmal atrial fibrillation and underwent cardioversion in August 2019.  She also has minor coronary artery disease and suspicion for coronary vasospasm on previous angiography, hypertension and hyperlipidemia.  Previous Holter monitor showed evidence of sinus node dysfunction and chronotropic incompetence while taking beta-blockers.   She has normal left ventricular systolic function on echo and had normal pulmonary function test in 2018.  She underwent implantation of a dual-chamber permanent pacemaker in April 2022 ([employer] Accolade).  After pacemaker implantation she was found to have recurrent episodes of largely asymptomatic paroxysmal atrial fibrillation.  Generally feels well but has been less active.  She was playing golf but was fed up with how poor her game was so she sold her golf clubs.  She is still participating in yoga classes twice a week and walks.  Pacemaker interrogation shows roughly 1.5 hours of activity daily.  She has gained back the 10 pounds that she had lost a year ago.  She has not been checking her blood pressure regularly. The patient specifically denies any chest pain at rest or with exertion, dyspnea at rest or with exertion, orthopnea, paroxysmal nocturnal dyspnea, syncope, palpitations, focal neurological deficits, intermittent claudication, lower extremity edema, unexplained weight gain, cough, hemoptysis or wheezing.  Pacemaker interrogation shows normal device function.  Anticipated generator longevity  is 9 years.  She has 57% atrial pacing with a good heart rate histogram and never requires ventricular pacing.  She has a low burden of atrial mode switch at under 1%.  Her last episode of true atrial fibrillation was a 6-hour event that occurred in March and was asymptomatic.  Since then she has had about 2 dozen episodes of paroxysmal atrial tachycardia with 1: 1 AV conduction usually 20 seconds in duration or last.  She has not felt these.  Her overall burden of atrial arrhythmia in the last year is only 9.6 hours.  Lead parameters are all normal.  Underlying rhythm is sinus bradycardia in the 50s.  Metabolic control is excellent with a HDL of 54 and LDL of 44 and normal triglycerides.  She has normal renal function.  In July her TSH was borderline suppressed at 0.263 and her free T4 was slightly high at 1.93.  ROS:   Please see the history of present illness.    All other systems are reviewed and are negative. Studies Reviewed: SABRA      EKG: Intracardiac electrogram shows atrial paced, ventricular sensed rhythm.  EKG Interpretation Date/Time:    Ventricular Rate:    PR Interval:    QRS Duration:    QT Interval:    QTC Calculation:   R Axis:      Text Interpretation:          Comprehensive check of the pacemaker in the office today. Risk Assessment/Calculations:      CHA2DS2-VASc Score = 5  The patient's score is based upon: CHF History: 0 HTN History: 1 Diabetes History: 0  Stroke History: 0 Vascular Disease History: 1 Age Score: 2 Gender Score: 1        Recent Labs: No results found for requested labs within last 365 days.  Recent Lipid Panel    Component Value Date/Time   CHOL 97 06/29/2021 0243   CHOL 113 02/26/2019 0858   TRIG 41 06/29/2021 0243   HDL 50 06/29/2021 0243   HDL 56 02/26/2019 0858   CHOLHDL 1.9 06/29/2021 0243   VLDL 8 06/29/2021 0243   LDLCALC 39 06/29/2021 0243   LDLCALC 44 02/26/2019 0858   07/22/2022 - 11/17/2022 Cholesterol 117, HDL 53,  LDL 44, triglycerides 98 Hemoglobin A1c 5.8% Hemoglobin 12.4, creatinine 0.89, potassium 4.0, ALT 7, TSH 0.31  08/11/2023 Cholesterol 110, HDL 54, LDL 44, triglycerides 62 Potassium 4.1, creatinine 0.86, ALT 6  10/19/2023 TSH 0.263  Physical Exam:    VS:  BP (!) 160/81   Pulse 70   Ht 5' 3 (1.6 m)   Wt 188 lb 4.8 oz (85.4 kg)   SpO2 95%   BMI 33.36 kg/m     Wt Readings from Last 3 Encounters:  04/26/24 188 lb 4.8 oz (85.4 kg)  01/18/23 177 lb (80.3 kg)  08/31/22 186 lb 12.8 oz (84.7 kg)      General: Alert, oriented x3, no distress, mildly obese.  Healthy pacemaker site. Head: no evidence of trauma, PERRL, EOMI, no exophtalmos or lid lag, no myxedema, no xanthelasma; normal ears, nose and oropharynx Neck: normal jugular venous pulsations and no hepatojugular reflux; brisk carotid pulses without delay and no carotid bruits Chest: clear to auscultation, no signs of consolidation by percussion or palpation, normal fremitus, symmetrical and full respiratory excursions Cardiovascular: normal position and quality of the apical impulse, regular rhythm, normal first and second heart sounds, no murmurs, rubs or gallops Abdomen: no tenderness or distention, no masses by palpation, no abnormal pulsatility or arterial bruits, normal bowel sounds, no hepatosplenomegaly Extremities: no clubbing, cyanosis or edema; 2+ radial, ulnar and brachial pulses bilaterally; 2+ right femoral, posterior tibial and dorsalis pedis pulses; 2+ left femoral, posterior tibial and dorsalis pedis pulses; no subclavian or femoral bruits Neurological: grossly nonfocal Psych: Normal mood and affect   ASSESSMENT:    No diagnosis found.     PLAN:    In order of problems listed above:  PAFib: Very low burden of paroxysmal atrial fibrillation prevalence remains low at less than 1%.  She has not taken any immediate release diltiazem  in the last 12 months.  Continue anticoagulation. SSS: Does not appear to  have any symptoms of chronotropic incompetence with the current sensor settings and the heart rate histogram distribution is appropriate. Pacemaker: Unchanged roughly 68% atrial pacing with a good heart rate histogram distribution. CAD: Has not had angina pectoris in the last couple of years.  We did have a suspicion that she could have vasospasm based on her previous symptoms and angiography. Hx of PE: No recurrent events.  Plan lifelong anticoagulation for her atrial fibrillation anyway.  May have some degree of residual exertional dyspnea from her massive pulmonary embolism. Anticoagulation: Denies bleeding or falls. HTN: Not well-controlled.  I am wondering if this is because of weight gain.  Could be a one-time event today since this is her first visit to our new location.  Asked her to keep a 1 week log of her blood pressure and send us  those records.  She is on the maximum dose of lisinopril .  Avoid increasing the hydrochlorothiazide  since this would  have adverse metabolic results.  She had significant lower extremity edema with calcium  channel blockers.  We could consider adding carvedilol  or spironolactone if necessary. HLP: All lipid parameters are excellent.  Continue rosuvastatin . Hypothyroidism: Labs from July suggested slightly excessive levothyroxine  supplementation.  The dose of her medication is adjusted by PCP.      Medication Adjustments/Labs and Tests Ordered: Current medicines are reviewed at length with the patient today.  Concerns regarding medicines are outlined above.  No orders of the defined types were placed in this encounter.   No orders of the defined types were placed in this encounter.  Dispo: Continue remote placement downloads every 3 months.  Follow-up in 12 months.  Signed, Jerel Balding, MD

## 2024-04-26 NOTE — Patient Instructions (Addendum)
 Medication Instructions:  Your physician recommends that you continue on your current medications as directed. Please refer to the Current Medication list given to you today.  *If you need a refill on your cardiac medications before your next appointment, please call your pharmacy*   Follow-Up: At [facility], you and your health needs are our priority.  As part of our continuing mission to provide you with exceptional heart care, our providers are all part of one team.  This team includes your primary Cardiologist (physician) and Advanced Practice Providers or APPs (Physician Assistants and Nurse Practitioners) who all work together to provide you with the care you need, when you need it.  Your next appointment:   1 year(s)  Provider:   Jerel Balding, MD    Other Instructions  HOW TO TAKE YOUR BLOOD PRESSURE: Rest 5 minutes before taking your blood pressure. Dont smoke or drink caffeinated beverages for at least 30 minutes before. Take your blood pressure before (not after) you eat. Sit comfortably with your back supported and both feet on the floor (dont cross your legs). Elevate your arm to heart level on a table or a desk. Use the proper sized cuff. It should fit smoothly and snugly around your bare upper arm. There should be enough room to slip a fingertip under the cuff. The bottom edge of the cuff should be 1 inch above the crease of the elbow. Ideally, take 3 measurements at one sitting and record the average.  Blood Pressure Log Date/Time Medications taken? (Y/N) Blood Pressure Heart Rate/Pulse

## 2024-05-01 ENCOUNTER — Ambulatory Visit: Payer: PPO

## 2024-05-01 DIAGNOSIS — I495 Sick sinus syndrome: Secondary | ICD-10-CM

## 2024-05-02 LAB — CUP PACEART REMOTE DEVICE CHECK
Battery Remaining Longevity: 90 mo
Battery Remaining Percentage: 91 %
Brady Statistic RA Percent Paced: 60 %
Brady Statistic RV Percent Paced: 0 %
Date Time Interrogation Session: 20260113020100
Implantable Lead Connection Status: 753985
Implantable Lead Connection Status: 753985
Implantable Lead Implant Date: 20220406
Implantable Lead Implant Date: 20220406
Implantable Lead Location: 753859
Implantable Lead Location: 753860
Implantable Lead Model: 7840
Implantable Lead Model: 7841
Implantable Lead Serial Number: 1037262
Implantable Lead Serial Number: 1127954
Implantable Pulse Generator Implant Date: 20220406
Lead Channel Impedance Value: 487 Ohm
Lead Channel Impedance Value: 815 Ohm
Lead Channel Pacing Threshold Amplitude: 0.7 V
Lead Channel Pacing Threshold Amplitude: 0.7 V
Lead Channel Pacing Threshold Pulse Width: 0.4 ms
Lead Channel Pacing Threshold Pulse Width: 0.4 ms
Lead Channel Setting Pacing Amplitude: 3.5 V
Lead Channel Setting Pacing Amplitude: 3.5 V
Lead Channel Setting Pacing Pulse Width: 0.4 ms
Lead Channel Setting Sensing Sensitivity: 2.5 mV
Pulse Gen Serial Number: 973497
Zone Setting Status: 755011

## 2024-05-04 NOTE — Progress Notes (Signed)
 Remote PPM Transmission

## 2024-05-05 ENCOUNTER — Ambulatory Visit: Payer: Self-pay | Admitting: Cardiovascular Disease

## 2024-05-07 ENCOUNTER — Encounter: Payer: Self-pay | Admitting: Cardiovascular Disease

## 2024-05-09 ENCOUNTER — Other Ambulatory Visit: Payer: Self-pay | Admitting: Cardiovascular Disease

## 2024-05-15 NOTE — Telephone Encounter (Signed)
 In accordance with refill protocols, please review and address the following requirements before this medication refill can be authorized:  Labs

## 2024-07-31 ENCOUNTER — Ambulatory Visit: Payer: PPO

## 2024-10-30 ENCOUNTER — Ambulatory Visit: Payer: PPO

## 2025-01-29 ENCOUNTER — Ambulatory Visit: Payer: PPO
